# Patient Record
Sex: Female | Born: 1965 | Race: Black or African American | Hispanic: No | Marital: Married | Smoking: Never smoker
Health system: Southern US, Community
[De-identification: ages and names within clinical notes are randomized; demographics above are authoritative.]

## PROBLEM LIST (undated history)

## (undated) DIAGNOSIS — K589 Irritable bowel syndrome without diarrhea: Secondary | ICD-10-CM

## (undated) DIAGNOSIS — D649 Anemia, unspecified: Secondary | ICD-10-CM

## (undated) DIAGNOSIS — D242 Benign neoplasm of left breast: Secondary | ICD-10-CM

## (undated) DIAGNOSIS — E669 Obesity, unspecified: Secondary | ICD-10-CM

## (undated) DIAGNOSIS — M25569 Pain in unspecified knee: Secondary | ICD-10-CM

## (undated) DIAGNOSIS — Z8042 Family history of malignant neoplasm of prostate: Secondary | ICD-10-CM

## (undated) DIAGNOSIS — M5136 Other intervertebral disc degeneration, lumbar region: Secondary | ICD-10-CM

## (undated) DIAGNOSIS — M7989 Other specified soft tissue disorders: Secondary | ICD-10-CM

## (undated) DIAGNOSIS — Z8489 Family history of other specified conditions: Secondary | ICD-10-CM

## (undated) DIAGNOSIS — Z973 Presence of spectacles and contact lenses: Secondary | ICD-10-CM

## (undated) DIAGNOSIS — M549 Dorsalgia, unspecified: Secondary | ICD-10-CM

## (undated) DIAGNOSIS — Z803 Family history of malignant neoplasm of breast: Secondary | ICD-10-CM

## (undated) DIAGNOSIS — M51369 Other intervertebral disc degeneration, lumbar region without mention of lumbar back pain or lower extremity pain: Secondary | ICD-10-CM

## (undated) DIAGNOSIS — M5126 Other intervertebral disc displacement, lumbar region: Secondary | ICD-10-CM

## (undated) DIAGNOSIS — K219 Gastro-esophageal reflux disease without esophagitis: Secondary | ICD-10-CM

## (undated) DIAGNOSIS — E739 Lactose intolerance, unspecified: Secondary | ICD-10-CM

## (undated) DIAGNOSIS — F419 Anxiety disorder, unspecified: Secondary | ICD-10-CM

## (undated) DIAGNOSIS — I10 Essential (primary) hypertension: Secondary | ICD-10-CM

## (undated) DIAGNOSIS — E559 Vitamin D deficiency, unspecified: Secondary | ICD-10-CM

## (undated) DIAGNOSIS — N6011 Diffuse cystic mastopathy of right breast: Secondary | ICD-10-CM

## (undated) DIAGNOSIS — Z780 Asymptomatic menopausal state: Secondary | ICD-10-CM

## (undated) DIAGNOSIS — N920 Excessive and frequent menstruation with regular cycle: Secondary | ICD-10-CM

## (undated) DIAGNOSIS — R12 Heartburn: Secondary | ICD-10-CM

## (undated) DIAGNOSIS — E785 Hyperlipidemia, unspecified: Secondary | ICD-10-CM

## (undated) DIAGNOSIS — R0602 Shortness of breath: Secondary | ICD-10-CM

## (undated) HISTORY — DX: Family history of malignant neoplasm of prostate: Z80.42

## (undated) HISTORY — DX: Hyperlipidemia, unspecified: E78.5

## (undated) HISTORY — PX: BUNIONECTOMY: SHX129

## (undated) HISTORY — DX: Vitamin D deficiency, unspecified: E55.9

## (undated) HISTORY — DX: Irritable bowel syndrome, unspecified: K58.9

## (undated) HISTORY — DX: Essential (primary) hypertension: I10

## (undated) HISTORY — DX: Anemia, unspecified: D64.9

## (undated) HISTORY — DX: Obesity, unspecified: E66.9

## (undated) HISTORY — DX: Shortness of breath: R06.02

## (undated) HISTORY — PX: TONSILLECTOMY: SUR1361

## (undated) HISTORY — DX: Pain in unspecified knee: M25.569

## (undated) HISTORY — DX: Other intervertebral disc degeneration, lumbar region without mention of lumbar back pain or lower extremity pain: M51.369

## (undated) HISTORY — DX: Other intervertebral disc degeneration, lumbar region: M51.36

## (undated) HISTORY — DX: Other intervertebral disc displacement, lumbar region: M51.26

## (undated) HISTORY — DX: Other specified soft tissue disorders: M79.89

## (undated) HISTORY — DX: Asymptomatic menopausal state: Z78.0

## (undated) HISTORY — DX: Lactose intolerance, unspecified: E73.9

## (undated) HISTORY — DX: Family history of malignant neoplasm of breast: Z80.3

## (undated) HISTORY — DX: Heartburn: R12

## (undated) HISTORY — DX: Dorsalgia, unspecified: M54.9

---

## 1996-08-24 HISTORY — PX: DILATION AND CURETTAGE OF UTERUS: SHX78

## 1998-07-05 ENCOUNTER — Inpatient Hospital Stay (HOSPITAL_COMMUNITY): Admission: AD | Admit: 1998-07-05 | Discharge: 1998-07-05 | Payer: Self-pay | Admitting: Obstetrics and Gynecology

## 1998-07-08 ENCOUNTER — Inpatient Hospital Stay (HOSPITAL_COMMUNITY): Admission: AD | Admit: 1998-07-08 | Discharge: 1998-07-08 | Payer: Self-pay | Admitting: Obstetrics and Gynecology

## 1998-07-10 ENCOUNTER — Inpatient Hospital Stay (HOSPITAL_COMMUNITY): Admission: AD | Admit: 1998-07-10 | Discharge: 1998-07-10 | Payer: Self-pay | Admitting: *Deleted

## 1998-07-12 ENCOUNTER — Inpatient Hospital Stay (HOSPITAL_COMMUNITY): Admission: AD | Admit: 1998-07-12 | Discharge: 1998-07-17 | Payer: Self-pay | Admitting: Obstetrics and Gynecology

## 1998-07-18 ENCOUNTER — Encounter (HOSPITAL_COMMUNITY): Admission: RE | Admit: 1998-07-18 | Discharge: 1998-10-16 | Payer: Self-pay | Admitting: Obstetrics and Gynecology

## 1998-08-26 ENCOUNTER — Other Ambulatory Visit: Admission: RE | Admit: 1998-08-26 | Discharge: 1998-08-26 | Payer: Self-pay | Admitting: Obstetrics and Gynecology

## 1998-10-25 ENCOUNTER — Encounter (HOSPITAL_COMMUNITY): Admission: RE | Admit: 1998-10-25 | Discharge: 1999-01-23 | Payer: Self-pay | Admitting: *Deleted

## 2000-11-08 ENCOUNTER — Encounter: Payer: Self-pay | Admitting: Emergency Medicine

## 2000-11-08 ENCOUNTER — Emergency Department (HOSPITAL_COMMUNITY): Admission: EM | Admit: 2000-11-08 | Discharge: 2000-11-08 | Payer: Self-pay | Admitting: Emergency Medicine

## 2004-08-24 HISTORY — PX: UMBILICAL HERNIA REPAIR: SHX196

## 2004-10-27 ENCOUNTER — Ambulatory Visit: Payer: Self-pay

## 2004-11-22 ENCOUNTER — Ambulatory Visit: Payer: Self-pay

## 2006-09-19 ENCOUNTER — Emergency Department: Payer: Self-pay | Admitting: Internal Medicine

## 2006-10-23 ENCOUNTER — Encounter: Payer: Self-pay | Admitting: Family Medicine

## 2006-10-23 LAB — CONVERTED CEMR LAB

## 2006-10-26 ENCOUNTER — Ambulatory Visit: Payer: Self-pay | Admitting: Family Medicine

## 2006-11-26 ENCOUNTER — Ambulatory Visit: Payer: Self-pay | Admitting: Family Medicine

## 2006-12-21 ENCOUNTER — Encounter: Payer: Self-pay | Admitting: Family Medicine

## 2006-12-21 DIAGNOSIS — E8881 Metabolic syndrome: Secondary | ICD-10-CM | POA: Insufficient documentation

## 2006-12-21 DIAGNOSIS — R609 Edema, unspecified: Secondary | ICD-10-CM | POA: Insufficient documentation

## 2006-12-21 DIAGNOSIS — E785 Hyperlipidemia, unspecified: Secondary | ICD-10-CM | POA: Insufficient documentation

## 2006-12-21 DIAGNOSIS — I10 Essential (primary) hypertension: Secondary | ICD-10-CM | POA: Insufficient documentation

## 2007-01-03 ENCOUNTER — Ambulatory Visit: Payer: Self-pay | Admitting: Family Medicine

## 2007-01-03 LAB — CONVERTED CEMR LAB
ALT: 13 units/L (ref 0–40)
AST: 17 units/L (ref 0–37)
Albumin: 3.9 g/dL (ref 3.5–5.2)
BUN: 9 mg/dL (ref 6–23)
Basophils Absolute: 0.1 10*3/uL (ref 0.0–0.1)
Basophils Relative: 1.4 % — ABNORMAL HIGH (ref 0.0–1.0)
CO2: 31 meq/L (ref 19–32)
Calcium: 8.9 mg/dL (ref 8.4–10.5)
Chloride: 110 meq/L (ref 96–112)
Cholesterol: 212 mg/dL (ref 0–200)
Creatinine, Ser: 0.8 mg/dL (ref 0.4–1.2)
Direct LDL: 123.2 mg/dL
Eosinophils Absolute: 0.1 10*3/uL (ref 0.0–0.6)
Eosinophils Relative: 1.5 % (ref 0.0–5.0)
GFR calc Af Amer: 102 mL/min
GFR calc non Af Amer: 84 mL/min
Glucose, Bld: 84 mg/dL (ref 70–99)
HCT: 33.9 % — ABNORMAL LOW (ref 36.0–46.0)
HDL: 60.3 mg/dL (ref >39.0)
Hemoglobin: 11.3 g/dL — ABNORMAL LOW (ref 12.0–15.0)
Lymphocytes Relative: 23.6 % (ref 12.0–46.0)
MCHC: 33.5 g/dL (ref 30.0–36.0)
MCV: 86.5 fL (ref 78.0–100.0)
Monocytes Absolute: 0.6 10*3/uL (ref 0.2–0.7)
Monocytes Relative: 7.7 % (ref 3.0–11.0)
Neutro Abs: 5.5 10*3/uL (ref 1.4–7.7)
Neutrophils Relative %: 65.8 % (ref 43.0–77.0)
Phosphorus: 3.2 mg/dL (ref 2.3–4.6)
Platelets: 357 10*3/uL (ref 150–400)
Potassium: 3.6 meq/L (ref 3.5–5.1)
RBC: 3.92 M/uL (ref 3.87–5.11)
RDW: 18.1 % — ABNORMAL HIGH (ref 11.5–14.6)
Sodium: 143 meq/L (ref 135–145)
Total CHOL/HDL Ratio: 3.5
Triglycerides: 56 mg/dL (ref 0–149)
VLDL: 11 mg/dL (ref 0–40)
WBC: 8.2 10*3/uL (ref 4.5–10.5)

## 2007-01-04 ENCOUNTER — Ambulatory Visit: Payer: Self-pay | Admitting: Family Medicine

## 2007-01-04 DIAGNOSIS — D509 Iron deficiency anemia, unspecified: Secondary | ICD-10-CM | POA: Insufficient documentation

## 2007-01-12 ENCOUNTER — Ambulatory Visit: Payer: Self-pay | Admitting: Obstetrics & Gynecology

## 2007-01-12 ENCOUNTER — Other Ambulatory Visit: Payer: Self-pay

## 2007-01-13 ENCOUNTER — Ambulatory Visit: Payer: Self-pay | Admitting: Obstetrics & Gynecology

## 2007-04-22 ENCOUNTER — Ambulatory Visit: Payer: Self-pay | Admitting: Family Medicine

## 2007-04-22 ENCOUNTER — Telehealth (INDEPENDENT_AMBULATORY_CARE_PROVIDER_SITE_OTHER): Payer: Self-pay | Admitting: *Deleted

## 2007-04-22 DIAGNOSIS — J321 Chronic frontal sinusitis: Secondary | ICD-10-CM | POA: Insufficient documentation

## 2007-05-17 ENCOUNTER — Ambulatory Visit: Payer: Self-pay | Admitting: Family Medicine

## 2007-05-26 ENCOUNTER — Telehealth (INDEPENDENT_AMBULATORY_CARE_PROVIDER_SITE_OTHER): Payer: Self-pay | Admitting: *Deleted

## 2007-07-20 ENCOUNTER — Ambulatory Visit: Payer: Self-pay | Admitting: Family Medicine

## 2007-08-09 ENCOUNTER — Telehealth: Payer: Self-pay | Admitting: Family Medicine

## 2007-08-17 ENCOUNTER — Ambulatory Visit: Payer: Self-pay | Admitting: Family Medicine

## 2007-08-18 ENCOUNTER — Encounter: Payer: Self-pay | Admitting: Family Medicine

## 2007-09-22 ENCOUNTER — Telehealth (INDEPENDENT_AMBULATORY_CARE_PROVIDER_SITE_OTHER): Payer: Self-pay | Admitting: *Deleted

## 2008-01-17 ENCOUNTER — Ambulatory Visit: Payer: Self-pay | Admitting: Family Medicine

## 2008-03-08 ENCOUNTER — Encounter: Payer: Self-pay | Admitting: Family Medicine

## 2008-03-14 ENCOUNTER — Encounter: Payer: Self-pay | Admitting: Family Medicine

## 2008-03-20 ENCOUNTER — Ambulatory Visit: Payer: Self-pay | Admitting: Family Medicine

## 2008-03-20 ENCOUNTER — Encounter (INDEPENDENT_AMBULATORY_CARE_PROVIDER_SITE_OTHER): Payer: Self-pay | Admitting: Internal Medicine

## 2008-03-20 ENCOUNTER — Other Ambulatory Visit: Admission: RE | Admit: 2008-03-20 | Discharge: 2008-03-20 | Payer: Self-pay | Admitting: Family Medicine

## 2008-03-20 LAB — CONVERTED CEMR LAB: Pap Smear: NORMAL

## 2008-03-23 ENCOUNTER — Encounter (INDEPENDENT_AMBULATORY_CARE_PROVIDER_SITE_OTHER): Payer: Self-pay | Admitting: *Deleted

## 2008-03-23 ENCOUNTER — Encounter (INDEPENDENT_AMBULATORY_CARE_PROVIDER_SITE_OTHER): Payer: Self-pay | Admitting: Internal Medicine

## 2008-03-27 ENCOUNTER — Encounter: Payer: Self-pay | Admitting: Family Medicine

## 2008-08-14 ENCOUNTER — Ambulatory Visit: Payer: Self-pay | Admitting: Family Medicine

## 2008-09-18 ENCOUNTER — Encounter: Payer: Self-pay | Admitting: Family Medicine

## 2009-04-02 ENCOUNTER — Encounter: Payer: Self-pay | Admitting: Family Medicine

## 2009-04-05 ENCOUNTER — Encounter: Payer: Self-pay | Admitting: Family Medicine

## 2009-04-12 ENCOUNTER — Ambulatory Visit: Payer: Self-pay | Admitting: Family Medicine

## 2009-04-12 ENCOUNTER — Other Ambulatory Visit: Admission: RE | Admit: 2009-04-12 | Discharge: 2009-04-12 | Payer: Self-pay | Admitting: Family Medicine

## 2009-04-12 ENCOUNTER — Encounter: Payer: Self-pay | Admitting: Family Medicine

## 2009-04-15 ENCOUNTER — Ambulatory Visit: Payer: Self-pay | Admitting: Family Medicine

## 2009-04-15 ENCOUNTER — Telehealth: Payer: Self-pay | Admitting: Family Medicine

## 2009-04-16 ENCOUNTER — Encounter (INDEPENDENT_AMBULATORY_CARE_PROVIDER_SITE_OTHER): Payer: Self-pay | Admitting: *Deleted

## 2009-07-12 ENCOUNTER — Ambulatory Visit: Payer: Self-pay | Admitting: Family Medicine

## 2009-07-12 ENCOUNTER — Other Ambulatory Visit: Admission: RE | Admit: 2009-07-12 | Discharge: 2009-07-12 | Payer: Self-pay | Admitting: Family Medicine

## 2009-07-12 DIAGNOSIS — R87610 Atypical squamous cells of undetermined significance on cytologic smear of cervix (ASC-US): Secondary | ICD-10-CM | POA: Insufficient documentation

## 2009-07-23 ENCOUNTER — Encounter (INDEPENDENT_AMBULATORY_CARE_PROVIDER_SITE_OTHER): Payer: Self-pay | Admitting: *Deleted

## 2009-08-29 ENCOUNTER — Encounter: Payer: Self-pay | Admitting: Family Medicine

## 2009-08-29 ENCOUNTER — Ambulatory Visit: Payer: Self-pay | Admitting: Family Medicine

## 2009-08-29 DIAGNOSIS — S04019A Injury of optic nerve, unspecified eye, initial encounter: Secondary | ICD-10-CM | POA: Insufficient documentation

## 2009-08-29 DIAGNOSIS — S04039A Injury of optic tract and pathways, unspecified eye, initial encounter: Secondary | ICD-10-CM | POA: Insufficient documentation

## 2009-09-23 ENCOUNTER — Telehealth: Payer: Self-pay | Admitting: Family Medicine

## 2009-09-26 ENCOUNTER — Ambulatory Visit: Payer: Self-pay | Admitting: Family Medicine

## 2009-10-03 ENCOUNTER — Telehealth: Payer: Self-pay | Admitting: Family Medicine

## 2009-10-11 ENCOUNTER — Ambulatory Visit: Payer: Self-pay | Admitting: Family Medicine

## 2009-11-01 ENCOUNTER — Ambulatory Visit: Payer: Self-pay | Admitting: Family Medicine

## 2009-12-04 ENCOUNTER — Telehealth: Payer: Self-pay | Admitting: Family Medicine

## 2010-03-11 ENCOUNTER — Encounter: Payer: Self-pay | Admitting: Family Medicine

## 2010-04-02 ENCOUNTER — Ambulatory Visit: Payer: Self-pay | Admitting: Family Medicine

## 2010-04-14 ENCOUNTER — Ambulatory Visit: Payer: Self-pay | Admitting: Family Medicine

## 2010-04-14 DIAGNOSIS — M722 Plantar fascial fibromatosis: Secondary | ICD-10-CM | POA: Insufficient documentation

## 2010-04-14 DIAGNOSIS — K219 Gastro-esophageal reflux disease without esophagitis: Secondary | ICD-10-CM | POA: Insufficient documentation

## 2010-04-21 ENCOUNTER — Encounter: Payer: Self-pay | Admitting: Family Medicine

## 2010-04-21 LAB — HM MAMMOGRAPHY: HM Mammogram: NORMAL

## 2010-04-22 ENCOUNTER — Encounter: Payer: Self-pay | Admitting: Family Medicine

## 2010-08-01 ENCOUNTER — Other Ambulatory Visit
Admission: RE | Admit: 2010-08-01 | Discharge: 2010-08-01 | Payer: Self-pay | Source: Home / Self Care | Admitting: Family Medicine

## 2010-08-01 ENCOUNTER — Encounter: Payer: Self-pay | Admitting: Family Medicine

## 2010-08-01 ENCOUNTER — Ambulatory Visit: Payer: Self-pay | Admitting: Internal Medicine

## 2010-08-01 DIAGNOSIS — N898 Other specified noninflammatory disorders of vagina: Secondary | ICD-10-CM | POA: Insufficient documentation

## 2010-08-01 LAB — CONVERTED CEMR LAB
Bilirubin Urine: NEGATIVE
Blood in Urine, dipstick: NEGATIVE
Glucose, Urine, Semiquant: NEGATIVE
KOH Prep: NEGATIVE
Ketones, urine, test strip: NEGATIVE
Nitrite: NEGATIVE
Pap Smear: NORMAL
Protein, U semiquant: NEGATIVE
Specific Gravity, Urine: <1.005
Urobilinogen, UA: 0.2
WBC Urine, dipstick: NEGATIVE
Whiff Test: POSITIVE
pH: 7.5

## 2010-08-01 LAB — HM PAP SMEAR

## 2010-08-05 ENCOUNTER — Ambulatory Visit: Payer: Self-pay | Admitting: Family Medicine

## 2010-08-07 ENCOUNTER — Encounter: Payer: Self-pay | Admitting: Family Medicine

## 2010-08-07 ENCOUNTER — Encounter (INDEPENDENT_AMBULATORY_CARE_PROVIDER_SITE_OTHER): Payer: Self-pay | Admitting: *Deleted

## 2010-08-07 LAB — CONVERTED CEMR LAB: Pap Smear: NEGATIVE

## 2010-09-23 NOTE — Assessment & Plan Note (Signed)
Summary: HA/CLE  Medications Added LEVAQUIN 500 MG  TABS (LEVOFLOXACIN) 1 by mouth once daily for 10 days FLONASE 50 MCG/ACT  SUSP (FLUTICASONE PROPIONATE) 2 sprays in each nostril qd DIFLUCAN 150 MG  TABS (FLUCONAZOLE) 1 by mouth times one as needed yeast infection        Vital Signs:  Patient Profile:   45 Years Old Female Height:     65.5 inches (166.37 cm) Weight:      181 pounds Temp:     98.5 degrees F oral Pulse rate:   72 / minute Pulse rhythm:   regular BP sitting:   142 / 94  (left arm) Cuff size:   large  Vitals Entered By: Lowella Petties (April 22, 2007 12:38 PM)                 Chief Complaint:  ? sinus headaches.  History of Present Illness: has some pain in L side of face with a lot of pressure- is worse in the middle of the nt 2 weeks felt like she was getting a cold-but never got if full blown had bp checked at gyn bp was 120/80 was taking some advil and tylenol then got some hoarseness- with post drip tylenol sinus helped short term- esp if she takes it at nt not much nasal d/c, some stuffiness (worse at nt) has had allergies for the past several years has never had sinus headaches like this  no fever, some dry cough at times has been very tired lately  did have a proceedure for heavy periods may be all to pcn?  Current Allergies: ! MEVACOR (LOVASTATIN) ! PCN     Review of Systems      See HPI  General      Complains of fatigue.      Denies chills, fever, and loss of appetite.  Eyes      Denies eye irritation.  ENT      Complains of nasal congestion, postnasal drainage, and sinus pressure.  Resp      Denies cough and shortness of breath.  GI      Denies nausea and vomiting.  Derm      Denies lesion(s) and rash.  Psych      mood is ok   Physical Exam  General:     Well-developed,well-nourished,in no acute distress; alert,appropriate and cooperative throughout examination Head:     Normocephalic and atraumatic  without obvious abnormalities. No apparent alopecia or balding. L max and frontal sinus tenderness Eyes:     vision grossly intact, pupils equal, pupils round, and pupils reactive to light.   Ears:     R ear normal and L ear normal.   Nose:     mucosal erythema and mucosal edema.   Mouth:     pharynx pink and moist.   Neck:     No deformities, masses, or tenderness noted.supple, no thyromegaly, and no JVD.   Lungs:     Normal respiratory effort, chest expands symmetrically. Lungs are clear to auscultation, no crackles or wheezes. Heart:     Normal rate and regular rhythm. S1 and S2 normal without gallop, murmur, click, rub or other extra sounds. Extremities:     No clubbing, cyanosis, edema, or deformity noted with normal full range of motion of all joints.   Skin:     turgor normal, color normal, and no rashes.   Cervical Nodes:     No lymphadenopathy noted Psych:     nl  affect, cheerful    Impression & Recommendations:  Problem # 1:  SINUSITIS, CHRONIC FRONTAL (ICD-473.1) will tx with levaquin (? all pcn) drink fluids inst to call if inc facial pain or no imp can try flonase for congestion Her updated medication list for this problem includes:    Levaquin 500 Mg Tabs (Levofloxacin) .Marland Kitchen... 1 by mouth once daily for 10 days    Flonase 50 Mcg/act Susp (Fluticasone propionate) .Marland Kitchen... 2 sprays in each nostril qd   Problem # 2:  HYPERTENSION (ICD-401.9) blood pressure is up today from decongestant will stop it also may have to go on OC for 1 mo- this will also raise bp- but will just be short term will f/u in several mo Her updated medication list for this problem includes:    Torsemide 20 Mg Tabs (Torsemide) .Marland Kitchen... Take one by mouth daily   Complete Medication List: 1)  Klor-con 20 Meq Pack (Potassium chloride) .... Take one by mouth daily 2)  Torsemide 20 Mg Tabs (Torsemide) .... Take one by mouth daily 3)  Vitamin B-12 Tabs (Cyanocobalamin tabs) .... Take by mouth as  directed 4)  Fish Oil Caps (Omega-3 fatty acids caps) .... Take two by mouth daily 5)  Ferrous Sulfate 325 (65 Fe) Mg Tabs (Ferrous sulfate) .... Take one by mouth daily 6)  Levaquin 500 Mg Tabs (Levofloxacin) .Marland Kitchen.. 1 by mouth once daily for 10 days 7)  Flonase 50 Mcg/act Susp (Fluticasone propionate) .... 2 sprays in each nostril qd 8)  Diflucan 150 Mg Tabs (Fluconazole) .Marland Kitchen.. 1 by mouth times one as needed yeast infection     Prescriptions: DIFLUCAN 150 MG  TABS (FLUCONAZOLE) 1 by mouth times one as needed yeast infection  #1 x 0   Entered and Authorized by:   Judith Part MD   Signed by:   Judith Part MD on 04/22/2007   Method used:   Print then Give to Patient   RxID:   [id number] FLONASE 50 MCG/ACT  SUSP (FLUTICASONE PROPIONATE) 2 sprays in each nostril qd  #1 mdi x 3   Entered and Authorized by:   Judith Part MD   Signed by:   Judith Part MD on 04/22/2007   Method used:   Print then Give to Patient   RxID:   [id number] LEVAQUIN 500 MG  TABS (LEVOFLOXACIN) 1 by mouth once daily for 10 days  #10 x 0   Entered and Authorized by:   Judith Part MD   Signed by:   Judith Part MD on 04/22/2007   Method used:   Print then Give to Patient   RxID:   [id number]   Appended Document: Immunization Entry       PPD Results    Date of reading: 05/19/2007    Results: < 5mm    Interpretation: negative

## 2010-09-23 NOTE — Assessment & Plan Note (Signed)
Summary: REPEAT PAP SMEAR/CLE   Vital Signs:  Patient profile:   45 year old female Weight:      178.50 pounds (81.14 kg) BMI:     29.36 Temp:     98.0 degrees F (36.67 degrees C) oral Pulse rate:   80 / minute Pulse rhythm:   regular BP sitting:   120 / 80  (left arm)  Vitals Entered By: Silas Sacramento CMA (July 12, 2009 8:44 AM) CC: repeat pap smear and cough for three weeks   History of Present Illness: here for repeat pap smear  had ascus  pap on 8/20 with neg hpv (high risk) no vaginal d/c or other symptoms  no hx of abn pap   LMP was early this month -- about 2 weeks ago nov 1   also been coughing for 3 weeks  thinks she has a sinus problems  about 3 weeks started as prod cough-- was green sputum and then yellow occ runny nose- but nothing major  a little sinus pressure for a while- used sinus headache med - and that is better  no fever  feels ok  cough is really bad at night -- better during the day  cough medicine- robitussin and old tussionex  not coughing anything up at all   Allergies: 1)  ! Mevacor (Lovastatin) 2)  ! Pcn  Past History:  Past Medical History: Last updated: 12/21/2006 Hyperlipidemia Hypertension  Past Surgical History: Last updated: 03/20/2008 C-S Hernia repain TSA 03/14/08 mammogram and [place]-- R breast mass   Family History: Last updated: 08/17/2007 parents both HTN, OA GM had some heart probs- implantible defibrillator MGM with breast ca  Social History: Last updated: 03/20/2008 non smoker Marital Status: Married Children: 2 Occupation: adlministrative  in Editor, commissioning care  Risk Factors: Caffeine Use: 2 (03/20/2008) Exercise: no (03/20/2008)  Risk Factors: Smoking Status: never (12/21/2006) Passive Smoke Exposure: yes (03/20/2008)  Review of Systems General:  Complains of fatigue; denies chills, fever, loss of appetite, and malaise. Eyes:  Denies blurring and eye irritation. ENT:   Complains of nasal congestion, postnasal drainage, and sore throat; denies sinus pressure. CV:  Denies chest pain or discomfort, lightheadness, palpitations, and shortness of breath with exertion. Resp:  Complains of cough; denies pleuritic, sputum productive, and wheezing. GI:  Denies abdominal pain, bloody stools, change in bowel habits, and indigestion. GU:  Denies abnormal vaginal bleeding, discharge, and dysuria. MS:  Denies joint pain. Derm:  Denies itching, lesion(s), poor wound healing, and rash. Neuro:  Denies numbness and tingling. Psych:  mood is ok. Endo:  Denies cold intolerance, excessive thirst, excessive urination, and heat intolerance.  Physical Exam  General:  Well-developed,well-nourished,in no acute distress; alert,appropriate and cooperative throughout examination Head:  normocephalic, atraumatic, and no abnormalities observed.  no sinus tenderness  Eyes:  vision grossly intact, pupils equal, pupils round, pupils reactive to light, and no injection.   Ears:  R ear normal and L ear normal.   Nose:  nares are boggy but clear  Mouth:  pharynx pink and moist.   some throat clearing and post nasal drip Neck:  supple with full rom and no masses or thyromegally, no JVD or carotid bruit  Lungs:  Normal respiratory effort, chest expands symmetrically. Lungs are clear to auscultation, no crackles or wheezes. Heart:  normal rate, regular rhythm, and no murmur.   Genitalia:  Normal introitus for age, no external lesions, no vaginal discharge, mucosa pink and moist, no vaginal or cervical lesions, no  vaginal atrophy, no friaility or hemorrhage, normal uterus size and position, no adnexal masses or tenderness Skin:  Intact without suspicious lesions or rashes Cervical Nodes:  No lymphadenopathy noted Inguinal Nodes:  No significant adenopathy Psych:  normal affect, talkative and pleasant    Impression & Recommendations:  Problem # 1:  COUGH (ICD-786.2) Assessment New s/p uri  - improved but not resolved and bothersome at night (no reflux symptoms) disc poss cyclic cough will tx with tussionex as needed (caution of sed)  if not imp 1 wk or if worse or fever or phlegm- will update   Problem # 2:  ASCUS PAP (ICD-795.01) Assessment: New repeat today -- with neg high risk hpv  update with result   Complete Medication List: 1)  Klor-con 20 Meq Pack (Potassium chloride) .... Take one by mouth daily 2)  Torsemide 20 Mg Tabs (Torsemide) .... Take one by mouth daily 3)  Ferrous Sulfate 325 (65 Fe) Mg Tabs (Ferrous sulfate) .... Take one by mouth daily 4)  One Daily Tabs (Multiple vitamin) .... Take 1 tablet by mouth once a day 5)  Tussionex Pennkinetic Er 8-10 Mg/37ml Lqcr (Chlorpheniramine-hydrocodone) .... 1/2 to 1 tsp by mouth up to two times a day as needed severe cough  Patient Instructions: 1)  I think your cough is post viral  2)  use tussionex as needed with caution 3)  update me if it worsens or if more productive or fever or if not improved in 1 week 4)  repeat pap today-will update you with results Prescriptions: TUSSIONEX PENNKINETIC ER 8-10 MG/5ML LQCR (CHLORPHENIRAMINE-HYDROCODONE) 1/2 to 1 tsp by mouth up to two times a day as needed severe cough  #8 oz x 0   Entered and Authorized by:   Judith Part MD   Signed by:   Judith Part MD on 07/12/2009   Method used:   Print then Give to Patient   RxID:   [id number]

## 2010-09-23 NOTE — Assessment & Plan Note (Signed)
Summary: BP CHECK PER DR Buffi Ewton/RI   Vital Signs:  Patient profile:   45 year old female Height:      65.5 inches Weight:      184.75 pounds BMI:     30.39 Temp:     97.9 degrees F oral Pulse rate:   76 / minute Pulse rhythm:   regular BP sitting:   136 / 86  (left arm) Cuff size:   large  Vitals Entered By: Lewanda Rife LPN (October 11, 2009 8:53 AM)  Serial Vital Signs/Assessments:  Time      Position  BP       Pulse  Resp  Temp     By                     138/90                         Judith Part MD   History of Present Illness: bps were running in 150s/ 100s - wed and thursday also was feeling really bad with a cold and congestion  felt funny and dizzy too and hard to focus   ? 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 Mevacor 2)  ! Pcn  Past History:  Past Medical History: Last updated: 12/21/2006 Hyperlipidemia Hypertension  Past Surgical History: Last updated: 03/20/2008 C-S Hernia repain TSA 03/14/08 mammogram and [place]-- R breast mass   Family History: Last updated: 08/17/2007 parents both HTN, OA GM had some heart probs- implantible defibrillator MGM with breast ca  Social History: Last updated: 03/20/2008 non smoker Marital Status: Married Children: 2 Occupation: adlministrative  in Editor, commissioning care  Risk Factors: Caffeine Use: 2 (03/20/2008) Exercise: no (03/20/2008)  Risk Factors: Smoking Status: never (12/21/2006) Passive Smoke Exposure: yes (03/20/2008)  Review of Systems General:  Complains of fatigue; denies chills, fever, loss of appetite, and malaise. Eyes:  Denies blurring and discharge. CV:  Denies chest pain or discomfort, lightheadness, near fainting, and palpitations. Resp:  Denies cough and wheezing. GI:  Denies abdominal  pain, bloody stools, and change in bowel habits. GU:  Denies urinary frequency. MS:  Denies muscle weakness. Derm:  Denies itching, lesion(s), poor wound healing, and rash. Neuro:  Denies headaches, numbness, and sensation of room spinning. Psych:  Denies anxiety and depression. Endo:  Denies cold intolerance, excessive thirst, excessive urination, and heat intolerance. Heme:  Denies abnormal bruising and bleeding.  Physical Exam  General:  Well-developed,well-nourished,in no acute distress; alert,appropriate and cooperative throughout examination Head:  normocephalic, atraumatic, and no abnormalities observed.  no sinus or temporal tenderness Eyes:  vision grossly intact, pupils equal, pupils round, and pupils reactive to light.   Mouth:  pharynx pink and moist.   Neck:  supple with full rom and no masses or thyromegally, no JVD or carotid bruit  Lungs:  Normal respiratory effort, chest expands symmetrically. Lungs are clear to auscultation, no crackles or wheezes. Heart:  Normal rate and regular rhythm. S1 and S2 normal without gallop, murmur, click, rub or other extra sounds. Msk:  small callous on right midfoot Extremities:  No clubbing, cyanosis, edema, or deformity noted with normal full range of motion of  all joints.   Neurologic:  sensation intact to light touch, gait normal, and DTRs symmetrical and normal.   Skin:  Intact without suspicious lesions or rashes Cervical Nodes:  No lymphadenopathy noted Psych:  normal affect, talkative and pleasant    Impression & Recommendations:  Problem # 1:  HYPERTENSION (ICD-401.9) Assessment Deteriorated bp up with multiple factors incl work stress and no time to exercise  will inc lisinopril to 20 mg - update if side eff or problems  f/u 4-6 wk disc low salt diet with inc water intakd  try to inc more exercise  Her updated medication list for this problem includes:    Torsemide 20 Mg Tabs (Torsemide) .Marland Kitchen... Take one by mouth daily     Lisinopril 20 Mg Tabs (Lisinopril) .Marland Kitchen... 1 by mouth once daily  Complete Medication List: 1)  Klor-con 20 Meq Pack (Potassium chloride) .... Take one by mouth daily 2)  Torsemide 20 Mg Tabs (Torsemide) .... Take one by mouth daily 3)  Ferrous Sulfate 325 (65 Fe) Mg Tabs (Ferrous sulfate) .... Take one by mouth daily 4)  One Daily Tabs (Multiple vitamin) .... Take 1 tablet by mouth once a day 5)  Flexeril 10 Mg Tabs (Cyclobenzaprine hcl) .... 1/2 to 1 by mouth up to three times a day as needed neck pain 6)  Lisinopril 20 Mg Tabs (Lisinopril) .Marland Kitchen.. 1 by mouth once daily  Patient Instructions: 1)  increase you lisinoprl to 20 mg daily (that is 2 of your 10 mg pills)  2)  no change in other medicine 3)  try to incorporate a little exercise  4)  watch salt and drink lots of water  5)  follow with me in 4-6 weeks  Prescriptions: LISINOPRIL 20 MG TABS (LISINOPRIL) 1 by mouth once daily  #30 x 11   Entered and Authorized by:   Judith Part MD   Signed by:   Judith Part MD on 10/11/2009   Method used:   Print then Give to Patient   RxID:   [id number]   Current Allergies (reviewed today): ! MEVACOR ! PCN

## 2010-09-23 NOTE — Assessment & Plan Note (Signed)
Summary: ? BACTERIAL INFECTION/PAP   Vital Signs:  Patient profile:   45 year old female Height:      65.75 inches Weight:      186.25 pounds BMI:     30.40 Temp:     98.6 degrees F oral Pulse rate:   80 / minute Pulse rhythm:   regular BP sitting:   120 / 88  (right arm) Cuff size:   large  Vitals Entered By: Linde Gillis CMA Duncan Dull) (August 01, 2010 8:15 AM) CC: ? bacterial infection   History of Present Illness: CC: ?bacterial infection  LMP 07/14/2010  Husband with vasectomy.  12/1 - fishy odor.  No discharge.   12/6 - slight white discharge, horrible odor.  Has had yeast infections before, no bacterial.  No fevers/chills, abd pain, dysuria.  Hasn't tried anything so far.  scheduled for pap 08/05/2010 because last pap was 07/2009, normal.  Abnl 03/2009 - ASCUS no high risk HPV detected.  requests rpt today, so doesn't have to do twice.  agreed.  Allergies: 1)  ! Mevacor 2)  ! Pcn  Past History:  Past Medical History: Last updated: 12/21/2006 Hyperlipidemia Hypertension  Social History: Last updated: 03/20/2008 non smoker Marital Status: Married Children: 2 Occupation: Theatre manager  in Editor, commissioning care  Review of Systems       per [employer]  Physical Exam  General:  overweight but generally well appearing  Genitalia:  Pelvic Exam:        External: normal female genitalia without lesions or masses        Vagina: normal without lesions or masses, slight white discharge, no odor        Cervix: normal without lesions or masses        Adnexa: normal bimanual exam without masses or fullness        Uterus: normal by palpation        Pap smear: performed   Impression & Recommendations:  Problem # 1:  LEUKORRHEA (ICD-623.5) wet prep with mild +whiff, + clue cells.  treat with flagyl.  advised to abstain from EtOH. Orders: UA Dipstick w/o Micro (manual) (04540) Wet Prep ([id number])  Problem # 2:  SCREENING FOR MALIGNANT NEOPLASM OF  THE CERVIX (ICD-V76.2) pap peformed.  Orders: Pap Smear, Thin Prep ( Collection of) ([id number])  Complete Medication List: 1)  Klor-con M20 20 Meq Cr-tabs (Potassium chloride crys cr) .Marland Kitchen.. 1 by mouth once daily 2)  Torsemide 20 Mg Tabs (Torsemide) .... Take one by mouth daily 3)  Ferrous Sulfate 325 (65 Fe) Mg Tabs (Ferrous sulfate) .... Take one by mouth daily 4)  One Daily Tabs (Multiple vitamin) .... Take 1 tablet by mouth once a day 5)  Flexeril 10 Mg Tabs (Cyclobenzaprine hcl) .... 1/2 to 1 by mouth up to three times a day as needed neck pain 6)  Lisinopril 20 Mg Tabs (Lisinopril) .Marland Kitchen.. 1 by mouth once daily 7)  Omeprazole 20 Mg Cpdr (Omeprazole) .Marland Kitchen.. 1 by mouth once daily in am 8)  Metronidazole 500 Mg Tabs (Metronidazole) .... One twice daily for 7 days, no etoh with it  Patient Instructions: 1)  we will contact you with pap smear results.  If you haven't heard from [place] in 2 wks, call us. 2)  Looks like you do have bacterial infection. 3)  Treat with flagyl twice daily for 7 days - no alcohol with this medicine. 4)  Let us know if not better. Prescriptions: METRONIDAZOLE 500 MG TABS (METRONIDAZOLE) one  twice daily for 7 days, no EtOH with it  #14 x 0   Entered and Authorized by:   Eustaquio Boyden  MD   Signed by:   Eustaquio Boyden  MD on 08/01/2010   Method used:   Electronically to        [facility]  [address] (retail)       [address]       [place], [place]  [address]       Ph: [phone]       Fax: [phone]   RxID:   [id number]    Orders Added: 1)  UA Dipstick w/o Micro (manual) [81002] 2)  Wet Prep [38756EP] 3)  Pap Smear, Thin Prep ( Collection of) [Q0091] 4)  Est. Patient Level III [32951]    Current Allergies (reviewed today): ! MEVACOR ! PCN  Laboratory Results   Urine Tests  Date/Time Received: August 01, 2010 8:20 AM   Routine Urinalysis   Color: yellow Appearance: Clear Glucose: negative   (Normal Range:  Negative) Bilirubin: negative   (Normal Range: Negative) Ketone: negative   (Normal Range: Negative) Spec. Gravity: <1.005   (Normal Range: 1.003-1.035) Blood: negative   (Normal Range: Negative) pH: 7.5   (Normal Range: 5.0-8.0) Protein: negative   (Normal Range: Negative) Urobilinogen: 0.2   (Normal Range: 0-1) Nitrite: negative   (Normal Range: Negative) Leukocyte Esterace: negative   (Normal Range: Negative)      Wet [place] Source: vaginal WBC/hpf: 1-5 Bacteria/hpf: 2+  Cocci Clue cells/hpf: moderate  Positive whiff Yeast/hpf: none Wet [place] KOH: Negative Trichomonas/hpf: none Comments: faint whiff positive. read by .................Eustaquio Boyden  MD  August 01, 2010 8:57 AM

## 2010-09-23 NOTE — Assessment & Plan Note (Signed)
Summary: COUGH,ST/CLE  Medications Added ZITHROMAX Z-PAK 250 MG  TABS (AZITHROMYCIN) take by mouth as directed TUSSIONEX PENNKINETIC ER 8-10 MG/5ML  LQCR (CHLORPHENIRAMINE-HYDROCODONE) 1/2 to 1 tsp by mouth two times a day as needed cough DIFLUCAN 150 MG  TABS (FLUCONAZOLE) 1 by mouth times one as needed yeast infection        Vital Signs:  Patient Profile:   45 Years Old Female Height:     65.5 inches (166.37 cm) Weight:      190 pounds Temp:     97.9 degrees F oral Pulse rate:   76 / minute BP sitting:   124 / 78  (left arm)  Vitals Entered By: Wandra Mannan (July 20, 2007 12:27 PM)                 Chief Complaint:  cough and st.  History of Present Illness: has had cold symptoms for 2-3 weeks- tried some otc zyrtec- which helped at first sinus drainage, then hoarse- no squeaky coughing up yellow green phlegm- especially at night cough is severe at night tried some ny quil cough- helped a little no fever that she can tell- does get hot some sinus pressure in her forehead- esp over the weekend ears feel ok throat very raw over the weekend- a little better now  Current Allergies (reviewed today): ! MEVACOR (LOVASTATIN) ! PCN     Review of Systems      See HPI  General      Complains of fatigue.      Denies chills and fever.  Eyes      Denies eye irritation and itching.  ENT      ears feel stopped up- cannot hear as well  Resp      Complains of cough and sputum productive.      Denies wheezing.  GI      Denies diarrhea, nausea, and vomiting.  Derm      Denies rash.   Physical Exam  General:     Well-developed,well-nourished,in no acute distress; alert,appropriate and cooperative throughout examination Head:     normocephalic, atraumatic, and no abnormalities observed.  slt maxillary sinus tenderness Eyes:     vision grossly intact, pupils equal, pupils round, pupils reactive to light, and no injection.   Ears:     R ear normal and L  ear normal.   Nose:     mucosal erythema and mucosal edema.   Mouth:     pharynx pink and moist.   Neck:     No deformities, masses, or tenderness noted. Lungs:     harsh bs at bases no rales or rhonchi scant wheeze on forced exp only Heart:     Normal rate and regular rhythm. S1 and S2 normal without gallop, murmur, click, rub or other extra sounds. Skin:     Intact without suspicious lesions or rashes Cervical Nodes:     No lymphadenopathy noted Psych:     nl affect    Impression & Recommendations:  Problem # 1:  BRONCHITIS-ACUTE (ICD-466.0) will tx with zithromax diflucan as needed yeast fluids and rest- update if no improvement use tussionex with caution because of sedation The following medications were removed from the medication list:    Levaquin 500 Mg Tabs (Levofloxacin) .Marland Kitchen... 1 by mouth once daily for 10 days  Her updated medication list for this problem includes:    Zithromax Z-pak 250 Mg Tabs (Azithromycin) .Marland Kitchen... Take by mouth as directed    Tussionex  Pennkinetic Er 8-10 Mg/25ml Lqcr (Chlorpheniramine-hydrocodone) .Marland Kitchen... 1/2 to 1 tsp by mouth two times a day as needed cough   Complete Medication List: 1)  Klor-con 20 Meq Pack (Potassium chloride) .... Take one by mouth daily 2)  Torsemide 20 Mg Tabs (Torsemide) .... Take one by mouth daily 3)  Fish Oil Caps (Omega-3 fatty acids caps) .... Take two by mouth daily 4)  Ferrous Sulfate 325 (65 Fe) Mg Tabs (Ferrous sulfate) .... Take one by mouth daily 5)  Flonase 50 Mcg/act Susp (Fluticasone propionate) .... 2 sprays in each nostril qd 6)  Zithromax Z-pak 250 Mg Tabs (Azithromycin) .... Take by mouth as directed 7)  Tussionex Pennkinetic Er 8-10 Mg/34ml Lqcr (Chlorpheniramine-hydrocodone) .... 1/2 to 1 tsp by mouth two times a day as needed cough 8)  Diflucan 150 Mg Tabs (Fluconazole) .Marland Kitchen.. 1 by mouth times one as needed yeast infection   Patient Instructions: 1)  try mucinex to help with congestion (it loosens  it) 2)  nasal saline spray is good for nasal congestion 3)  take zithromax as directed 4)  use the diflucan if needed for yeast infection 5)  tussionex may sedate- so be cautious 6)  if worse cough, fever, or shortness of breath or sinus pain- let me know 7)  call if not improving in a week    Prescriptions: DIFLUCAN 150 MG  TABS (FLUCONAZOLE) 1 by mouth times one as needed yeast infection  #1 x 1   Entered and Authorized by:   Judith Part MD   Signed by:   Judith Part MD on 07/20/2007   Method used:   Print then Give to Patient   RxID:   [id number] [facility] ER 8-10 MG/5ML  LQCR (CHLORPHENIRAMINE-HYDROCODONE) 1/2 to 1 tsp by mouth two times a day as needed cough  #8 oz x 0   Entered and Authorized by:   Judith Part MD   Signed by:   Judith Part MD on 07/20/2007   Method used:   Print then Give to Patient   RxID:   [id number] ZITHROMAX Z-PAK 250 MG  TABS (AZITHROMYCIN) take by mouth as directed  #1 pack x 0   Entered and Authorized by:   Judith Part MD   Signed by:   Judith Part MD on 07/20/2007   Method used:   Print then Give to Patient   RxID:   [id number]  ] Current Allergies (reviewed today): ! MEVACOR (LOVASTATIN) ! PCN Current Medications (including changes made in today's visit):  KLOR-CON 20 MEQ PACK (POTASSIUM CHLORIDE) Take one by mouth daily TORSEMIDE 20 MG TABS (TORSEMIDE) Take one by mouth daily FISH OIL  CAPS (OMEGA-3 FATTY ACIDS CAPS) Take two by mouth daily FERROUS SULFATE 325 (65 FE) MG TABS (FERROUS SULFATE) Take one by mouth daily FLONASE 50 MCG/ACT  SUSP (FLUTICASONE PROPIONATE) 2 sprays in each nostril qd ZITHROMAX Z-PAK 250 MG  TABS (AZITHROMYCIN) take by mouth as directed TUSSIONEX PENNKINETIC ER 8-10 MG/5ML  LQCR (CHLORPHENIRAMINE-HYDROCODONE) 1/2 to 1 tsp by mouth two times a day as needed cough DIFLUCAN 150 MG  TABS (FLUCONAZOLE) 1 by mouth times one as needed yeast infection    Vital Signs:   Patient Profile:   45 Years Old Female Height:     65.5 inches (166.37 cm) Weight:      190 pounds Temp:     97.9 degrees F oral Pulse rate:   76 / minute BP sitting:   124 /  78              

## 2010-09-23 NOTE — Progress Notes (Signed)
Summary: ?possible interaction between meds?   Phone Note Call from Patient Call back at [phone]   Caller: Patient Call For: tower Summary of Call: pt was just seen she says seh spoke to gyn and they put her on the patch instead of the pill. pt read package and it said there was a higher chance for blood clot on patch than on pill,  sje wants to make sure patch is ok to take with her bp med. Initial call taken by: Liane Comber,  April 22, 2007 1:44 PM  Follow-up for Phone Call        she needs to be really careful while traveling to keep her legs moving and stop frequently to avoid blood clots it may raise blood pressure like the pill can- so needs to be short term Follow-up by: Judith Part MD,  April 22, 2007 1:55 PM  Additional Follow-up for Phone Call Additional follow up Details #1::        Advised patient ..................................................................Marland KitchenMarcelle Smiling Susanne Baumgarner  April 22, 2007 2:12 PM

## 2010-09-23 NOTE — Progress Notes (Signed)
Summary: BP elevated  Phone Note Call from Patient Call back at [phone]   Caller: Patient Summary of Call: Pt's BP today is 154/112, 153/108 and she is feeling some dizzy and light headed.  she has sinus drainage so has been taking otc meds- delsym, aleve, I told her these meds shouldnt make her BP go up.  She isnt taking any BP medicine now.  Please advise on what to do. Initial call taken by: Lowella Petties CMA,  October 03, 2009 2:34 PM  Follow-up for Phone Call        Aleve may be affecting her negatively...avoid. See Dr Milinda Antis if sxs cont into AM. Her BP was high when recently seen. Is she back on Tosemide?  She may need something added for BP control. Follow-up by: Shaune Leeks MD,  October 03, 2009 2:36 PM  Additional Follow-up for Phone Call Additional follow up Details #1::        Advised pt, she is back on torsemide since her last office visit..  I advised pt and told her that I would send this note to Dr Milinda Antis to see if she can be worked in on friday. Additional Follow-up by: Lowella Petties CMA,  October 03, 2009 2:51 PM    Additional Follow-up for Phone Call Additional follow up Details #2::    Thank you. Shaune Leeks MD  October 03, 2009 2:59 PM   I want her to start another bp med in addn to what she is taking please call in lisinopril px written on EMR for call in  follow up with me next week update me if any side eff or problems  Follow-up by: Judith Part MD,  October 03, 2009 4:17 PM  Additional Follow-up for Phone Call Additional follow up Details #3:: Details for Additional Follow-up Action Taken: Patient notified as instructed by telephone. Pt scheduled appt with Dr Milinda Antis on 10/11/09 at 8:45am. Nurse visti was cancelled. Medication phoned to [facility] in [facility] pharmacy as instructed. Additional Follow-up by: Lewanda Rife LPN,  October 03, 2009 4:38 PM  New/Updated Medications: LISINOPRIL 10 MG TABS (LISINOPRIL) 1 by mouth once  daily Prescriptions: LISINOPRIL 10 MG TABS (LISINOPRIL) 1 by mouth once daily  #30 x 1   Entered and Authorized by:   Judith Part MD   Signed by:   Judith Part MD on 10/03/2009   Method used:   Telephoned to ...       [facility] (retail)       [address]       [place], [place]  [address]       Ph: [phone]       Fax: [phone]   RxID:   [id number]

## 2010-09-23 NOTE — Progress Notes (Signed)
Summary: neck pain   Phone Note Call from Patient Call back at Home Phone [phone]   Caller: Patient Call For: Judith Part MD Summary of Call: Patient says that she is having  neck pain. It started on Thursday, she says that she did not do anything to cause the pain she just thought it was stress or the way she slept the night before, but the paini has not gotten any better. She says it feels stiff. Patient says that she has not taken anything for the pain.  Patient wants to know if she can have a muscle relaxer called in for her to [facility] in [place].  Initial call taken by: Melody Comas,  September 23, 2009 11:51 AM  Follow-up for Phone Call        I would actually recommend short course of nsaid instead of muscle relaxer try aleve 2 pills with food two times a day for 3 days heat on neck/ a foam cervical support pillow can also help  f/u if not improved  Follow-up by: Judith Part MD,  September 23, 2009 12:07 PM  Additional Follow-up for Phone Call Additional follow up Details #1::        Advised pt, she says she has already tried advil and tylenol and is using heat and these things arent helping.  then she needs to be seen- sched appt  can call in short supply of flexeril- warn of sedation px written on EMR for call in  Additional Follow-up by: Lowella Petties CMA,  September 23, 2009 12:26 PM    Additional Follow-up for Phone Call Additional follow up Details #2::    Advised pt, appt made, med called to [facility]. Follow-up by: Lowella Petties CMA,  September 23, 2009 2:11 PM  New/Updated Medications: FLEXERIL 10 MG TABS (CYCLOBENZAPRINE HCL) 1/2 to 1 by mouth up to three times a day as needed neck pain Prescriptions: FLEXERIL 10 MG TABS (CYCLOBENZAPRINE HCL) 1/2 to 1 by mouth up to three times a day as needed neck pain  #15 x 0   Entered and Authorized by:   Judith Part MD   Signed by:   Judith Part MD on 09/23/2009   Method used:   Telephoned  to ...       [facility] (retail)       [address]       [place], [place]  [address]       Ph: [phone]       Fax: [phone]   RxID:   [id number]

## 2010-09-23 NOTE — Letter (Signed)
Summary: [place] [employer] Medical Information Form  [place] [employer] Medical Information Form   Imported By: Beau Fanny 03/26/2008 16:12:50  _____________________________________________________________________  External Attachment:    Type:   Image     Comment:   External Document

## 2010-09-23 NOTE — Assessment & Plan Note (Signed)
Summary: CPX WITH PAP/JRR   Vital Signs:  Patient profile:   45 year old female Height:      65.75 inches Weight:      184.25 pounds BMI:     30.07 Temp:     98 degrees F oral Pulse rate:   84 / minute Pulse rhythm:   regular BP sitting:   118 / 70  (left arm) Cuff size:   large  Vitals Entered By: Lewanda Rife LPN (April 14, 2010 2:42 PM) CC: 30 minute ck up  with pap LMP 04/04/10   History of Present Illness: here for wellness/ gyn exam  is feeling good   is having trouble with L foot pain  worse in am  comes and goes  is wearing a pair of floppy shoes and flip flops  was walking a lot when traveling -- better with tennis shoes    wt is up 3 lb with bmi 30  HTN well controlled with 118/70 today  lipids slt better with trig 50 and HDL 64 and LDL 136 (was 140) diet is not as good as it should be  some red meat / some egg yolks and butter and desserts   pap 11/10-- was ok on 2nd check -- was ascus without hpv on first check  still having periods -- a lot spotting afterwards  is tolerable  is having more moodiness -- thinks is hormonal , negative thoughts and much worse around menses  week of period is bad   mam 8/10 self exam- no lumps or changes   Td 8/10  indigestion is worse-- has a lot of trouble with it at night on regular basis      Allergies: 1)  ! Mevacor 2)  ! Pcn  Past History:  Past Medical History: Last updated: 12/21/2006 Hyperlipidemia Hypertension  Past Surgical History: Last updated: 03/20/2008 C-S Hernia repain TSA 03/14/08 mammogram and [place]-- R breast mass   Family History: Last updated: 08/17/2007 parents both HTN, OA GM had some heart probs- implantible defibrillator MGM with breast ca  Social History: Last updated: 03/20/2008 non smoker Marital Status: Married Children: 2 Occupation: adlministrative  in Editor, commissioning care  Risk Factors: Caffeine Use: 2 (03/20/2008) Exercise: no  (03/20/2008)  Risk Factors: Smoking Status: never (12/21/2006) Passive Smoke Exposure: yes (03/20/2008)  Review of Systems General:  Denies fatigue, loss of appetite, and malaise. Eyes:  Denies blurring and eye pain. CV:  Denies chest pain or discomfort, palpitations, and swelling of feet. Resp:  Denies cough and shortness of breath. GI:  Denies change in bowel habits and indigestion. GU:  Denies abnormal vaginal bleeding, discharge, dysuria, and urinary frequency. MS:  Complains of joint pain and stiffness; denies joint redness, joint swelling, cramps, and muscle weakness. Derm:  Denies itching, lesion(s), poor wound healing, and rash. Neuro:  Denies numbness and tingling. Psych:  Denies anxiety and depression. Endo:  Denies cold intolerance, excessive thirst, excessive urination, and heat intolerance. Heme:  Denies abnormal bruising and bleeding.  Physical Exam  General:  overweight but generally well appearing  Head:  normocephalic, atraumatic, and no abnormalities observed.   Eyes:  vision grossly intact, pupils equal, pupils round, and pupils reactive to light.  no conjunctival pallor, injection or icterus  Ears:  R ear normal and L ear normal.   Nose:  no nasal discharge.   Mouth:  pharynx pink and moist.   Neck:  supple with full rom and no masses or thyromegally, no JVD or  carotid bruit  Chest Wall:  No deformities, masses, or tenderness noted. Lungs:  Normal respiratory effort, chest expands symmetrically. Lungs are clear to auscultation, no crackles or wheezes. Heart:  Normal rate and regular rhythm. S1 and S2 normal without gallop, murmur, click, rub or other extra sounds. Abdomen:  Bowel sounds positive,abdomen soft and non-tender without masses, organomegaly or hernias noted., no renal bruits  Msk:  No deformity or scoliosis noted of thoracic or lumbar spine.  no foot deformity or tenderness over either foot -- bony or calcaneous nl rom toes and feet Pulses:  R and L  carotid,radial,femoral,dorsalis pedis and posterior tibial pulses are full and equal bilaterally Extremities:  No clubbing, cyanosis, edema, or deformity noted with normal full range of motion of all joints.   Neurologic:  strength normal in all extremities, sensation intact to light touch, gait normal, and DTRs symmetrical and normal.   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 Take one by mouth daily  Hgb: 11.3 (01/03/2007)   Hct: 33.9 (01/03/2007)   Platelets: 357 (01/03/2007) RBC: 3.92 (01/03/2007)   RDW: 18.1 (01/03/2007)   WBC: 8.2 (01/03/2007) MCV: 86.5 (01/03/2007)   MCHC: 33.5 (01/03/2007)  Problem # 3:  HYPERTENSION (ICD-401.9)  good control med refilled disc diet and exercise  Her updated medication list for this problem includes:    Torsemide 20 Mg Tabs (Torsemide) .Marland Kitchen... Take one by mouth daily    Lisinopril 20 Mg Tabs (Lisinopril) .Marland Kitchen... 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 1 by mouth once daily in am  Orders: Prescription Created Electronically [id number])  Complete Medication List: 1)  Klor-con M20 20 Meq Cr-tabs (Potassium chloride crys cr) .Marland Kitchen.. 1 by mouth once daily 2)  Torsemide 20 Mg Tabs (Torsemide) .... Take one by mouth daily 3)  Ferrous Sulfate 325 (65 Fe) Mg Tabs (Ferrous sulfate) .... Take one by mouth daily 4)  One Daily Tabs (Multiple vitamin) .... Take 1 tablet by mouth once a day 5)  Flexeril 10 Mg Tabs (Cyclobenzaprine hcl) .... 1/2 to 1 by mouth up to three times a day as needed neck pain 6)  Lisinopril 20 Mg Tabs (Lisinopril) .Marland Kitchen.. 1 by mouth once daily 7)  Omeprazole 20 Mg Cpdr (Omeprazole) .Marland Kitchen.. 1 by mouth once daily in am  Other Orders: TB Skin Test [phone]) Admin 1st Vaccine (  60109) Radiology Referral (Radiology)  Patient Instructions: 1)  you can raise your HDL (good cholesterol) by increasing exercise and eating omega 3 fatty acid supplement like fish oil or flax seed oil over the counter 2)  you can lower LDL (bad cholesterol) by limiting saturated fats in diet like red meat, fried foods, egg yolks, fatty breakfast meats, high fat dairy products and shellfish  3)  schedule gyn exam/ pap for early december (15 min is ok)  4)  PPD today - need to schedule nurse visit to read that on wed please  5)  wear hard soled shoes and don't go barefoot , ice foot twice daily for 5 minutes when you can  6)  we will schedule mammogram at check out  7)  start omeprazole (generic prilosec) each am about 30 min before breakfast  8)  if reflux does not improve with that let me know  Prescriptions: OMEPRAZOLE 20 MG CPDR (OMEPRAZOLE) 1 by mouth once daily in am  #30 x 11   Entered and Authorized by:   Judith Part MD   Signed by:   Judith Part MD on  04/14/2010   Method used:   Electronically to        [facility] (retail)       [address]       [place], [place]  [address]       Ph: [phone]       Fax: [phone]   RxID:   [id number] TORSEMIDE 20 MG TABS (TORSEMIDE) Take one by mouth daily  #30 x 11   Entered and Authorized by:   Judith Part MD   Signed by:   Judith Part MD on 04/14/2010   Method used:   Electronically to        [facility] (retail)       [address]       [place], [place]  [address]       Ph: [phone]       Fax: [phone]   RxID:   [id number]   Current Allergies (reviewed today): ! MEVACOR ! PCN      Immunizations Administered:  PPD Skin Test:    Vaccine Type: PPD    Site: left forearm    Mfr: Sanofi Pasteur    Dose: 0.1 ml    Route: ID    Given by: Lewanda Rife LPN    Exp. Date: 06/26/2011    Lot #: [id number]  PPD Results    Date of reading: 04/17/2010    Results: < 5mm    Interpretation: negative   Form faxed to [phone] as instructed, sent for scanning and pt given a copy of form also.Lewanda Rife LPN  April 17, 2010 9:18 AM

## 2010-09-23 NOTE — Assessment & Plan Note (Signed)
Summary: ST/CLE   Vital Signs:  Patient Profile:   45 Years Old Female Height:     65.5 inches (166.37 cm) Weight:      185 pounds Temp:     98.9 degrees F oral Pulse rate:   66 / minute Pulse rhythm:   regular BP sitting:   142 / 90  (left arm) Cuff size:   regular  Vitals Entered By: Mervin Hack CMA (August 14, 2008 2:53 PM)                 Chief Complaint:  sore throat.  History of Present Illness: Here for ST--onset 5-6d, nasal congestion, nose bleed, cough--productive at times, no fever or chills --taking nyquil, delsym--does not hold her  --has not missed work    Current Allergies (reviewed today): ! MEVACOR (LOVASTATIN) ! PCN     Review of Systems      See HPI   Physical Exam  General:     alert, well-developed, well-nourished, and well-hydrated.   Ears:     TMs retracted with increased fluid bilat Nose:     no airflow obstruction, mucosal erythema, and mucosal edema.  sinuses +,- Mouth:     no exudates and pharyngeal erythema.   Lungs:     moist harsh cohgh, no crackles and no wheezes.   Cervical Nodes:     shotty tonsilar nodes, no anterior cervical adenopathy and no posterior cervical adenopathy.   Psych:     normally interactive and good eye contact.      Impression & Recommendations:  Problem # 1:  BRONCHITIS-ACUTE (ICD-466.0) Assessment: New continue comfort care measures: increase po fluids, rest, tylenol or IBP as needed will start on z pac willl use tussinex at hs as needed cough as needed  see  back in 7-10d if not improved Her updated medication list for this problem includes:    Zithromax Z-pak 250 Mg Tabs (Azithromycin) ..... Use as diredted    Tussionex Pennkinetic Er 8-10 Mg/49ml Lqcr (Chlorpheniramine-hydrocodone) .Marland Kitchen... Take 1 tsp at bedtime as needed cough   Complete Medication List: 1)  Klor-con 20 Meq Pack (Potassium chloride) .... Take one by mouth daily 2)  Torsemide 20 Mg Tabs (Torsemide) .... Take one by  mouth daily 3)  Ferrous Sulfate 325 (65 Fe) Mg Tabs (Ferrous sulfate) .... Take one by mouth daily 4)  Mvi  .Marland Kitchen.. 1 by mouth once daily 5)  Zithromax Z-pak 250 Mg Tabs (Azithromycin) .... Use as diredted 6)  Tussionex Pennkinetic Er 8-10 Mg/26ml Lqcr (Chlorpheniramine-hydrocodone) .... Take 1 tsp at bedtime as needed cough    Prescriptions: TUSSIONEX PENNKINETIC ER 8-10 MG/5ML LQCR (CHLORPHENIRAMINE-HYDROCODONE) take 1 tsp at bedtime as needed cough Brand medically necessary #14ml x 0   Entered and Authorized by:   Gildardo Griffes FNP   Signed by:   Gildardo Griffes FNP on 08/14/2008   Method used:   Print then Give to Patient   RxID:   [id number] ZITHROMAX Z-PAK 250 MG TABS (AZITHROMYCIN) use as diredted  #1 x 0   Entered and Authorized by:   Gildardo Griffes FNP   Signed by:   Gildardo Griffes FNP on 08/14/2008   Method used:   Print then Give to Patient   RxID:   [id number]  ] Current Allergies (reviewed today): ! MEVACOR (LOVASTATIN) ! PCN
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
## 2010-09-23 NOTE — Letter (Signed)
Summary: Medical Eval Form/[place] [facility]  Medical Eval Form/[place] [facility] [employer] [facility]   Imported By: Lanelle Bal 04/23/2010 10:43:31  _____________________________________________________________________  External Attachment:    Type:   Image     Comment:   External Document

## 2010-09-23 NOTE — Assessment & Plan Note (Signed)
Summary: ROA VISIT FOR 4 WEEK FOLLOW-UP/JRR   Vital Signs:  Patient profile:   45 year old female Height:      65.5 inches Weight:      181 pounds BMI:     29.77 Temp:     98 degrees F oral Pulse rate:   76 / minute Pulse rhythm:   regular BP sitting:   118 / 84  (left arm) Cuff size:   large  Vitals Entered By: Lewanda Rife LPN (November 01, 2009 8:49 AM)  History of Present Illness: here for f/u for HTN   feels great overall -- is still tired   last visit had some inc bp here and at home - so inc her lisinopril to 20  also a lot of stress at that time  this is improved a bit   no down time -- still lots of soccer games   wt is down 3 lb -- has been walking regularly   Allergies: 1)  ! Mevacor 2)  ! Pcn  Past History:  Past Medical History: Last updated: 12/21/2006 Hyperlipidemia Hypertension  Past Surgical History: Last updated: 03/20/2008 C-S Hernia repain TSA 03/14/08 mammogram and [place]-- R breast mass   Family History: Last updated: 08/17/2007 parents both HTN, OA GM had some heart probs- implantible defibrillator MGM with breast ca  Social History: Last updated: 03/20/2008 non smoker Marital Status: Married Children: 2 Occupation: adlministrative  in Editor, commissioning care  Risk Factors: Caffeine Use: 2 (03/20/2008) Exercise: no (03/20/2008)  Risk Factors: Smoking Status: never (12/21/2006) Passive Smoke Exposure: yes (03/20/2008)  Review of Systems General:  Denies fatigue, fever, loss of appetite, and malaise. Eyes:  Denies blurring and eye pain. CV:  Denies chest pain or discomfort, palpitations, and shortness of breath with exertion. Resp:  Denies cough and wheezing. GI:  Denies abdominal pain, bloody stools, change in bowel habits, and indigestion. MS:  Denies joint swelling. Derm:  Denies itching, poor wound healing, and rash. Neuro:  Denies headaches, numbness, and tingling. Heme:  Denies abnormal bruising and  bleeding.  Physical Exam  General:  Well-developed,well-nourished,in no acute distress; alert,appropriate and cooperative throughout examination Head:  normocephalic, atraumatic, and no abnormalities observed.   Eyes:  vision grossly intact, pupils equal, pupils round, and pupils reactive to light.   Mouth:  pharynx pink and moist.   Neck:  supple with full rom and no masses or thyromegally, no JVD or carotid bruit  Lungs:  Normal respiratory effort, chest expands symmetrically. Lungs are clear to auscultation, no crackles or wheezes. Heart:  Normal rate and regular rhythm. S1 and S2 normal without gallop, murmur, click, rub or other extra sounds. Extremities:  No clubbing, cyanosis, edema, or deformity noted with normal full range of motion of all joints.   Neurologic:  sensation intact to light touch, gait normal, and DTRs symmetrical and normal.   Skin:  Intact without suspicious lesions or rashes Cervical Nodes:  No lymphadenopathy noted Psych:  normal affect, talkative and pleasant    Impression & Recommendations:  Problem # 1:  HYPERTENSION (ICD-401.9) Assessment Improved  much imp with inc in lisinopril and walking /good habits  no changes planned  did refil her K  f/u summer for labs and PE  Her updated medication list for this problem includes:    Torsemide 20 Mg Tabs (Torsemide) .Marland Kitchen... Take one by mouth daily    Lisinopril 20 Mg Tabs (Lisinopril) .Marland Kitchen... 1 by mouth once daily  Orders: Prescription Created Electronically [phone]  Complete Medication List: 1)  Klor-con 20 Meq Pack (Potassium chloride) .... Take one by mouth daily 2)  Torsemide 20 Mg Tabs (Torsemide) .... Take one by mouth daily 3)  Ferrous Sulfate 325 (65 Fe) Mg Tabs (Ferrous sulfate) .... Take one by mouth daily 4)  One Daily Tabs (Multiple vitamin) .... Take 1 tablet by mouth once a day 5)  Flexeril 10 Mg Tabs (Cyclobenzaprine hcl) .... 1/2 to 1 by mouth up to three times a day as needed neck pain 6)   Lisinopril 20 Mg Tabs (Lisinopril) .Marland Kitchen.. 1 by mouth once daily  Patient Instructions: 1)  no change in medicines 2)  keep up the walking 3)  update me if any problems  4)  schedule fasting labs in august and then PE wellness panel and lippids v70.0, 401.1  Prescriptions: KLOR-CON 20 MEQ PACK (POTASSIUM CHLORIDE) Take one by mouth daily  #30 x 5   Entered and Authorized by:   Judith Part MD   Signed by:   Judith Part MD on 11/01/2009   Method used:   Electronically to        [facility] (retail)       [address]       [place], [place]  [address]       Ph: [phone]       Fax: [phone]   RxID:   [id number]   Current Allergies (reviewed today): ! MEVACOR ! PCN

## 2010-09-23 NOTE — Miscellaneous (Signed)
Summary: mammogram screening   Clinical Lists Changes  Observations: Added new observation of MAMMO DUE: 04/2011 (04/22/2010 10:02) Added new observation of MAMMOGRAM: normal (04/21/2010 10:03)      Preventive Care Screening  Mammogram:    Date:  04/21/2010    Next Due:  04/2011    Results:  normal

## 2010-09-23 NOTE — Assessment & Plan Note (Signed)
Summary: CHECK PLACE ON BACK OF FOOT/CLE   Vital Signs:  Patient profile:   45 year old female Height:      65.5 inches Weight:      183.50 pounds BMI:     30.18 Temp:     98.7 degrees F oral Pulse rate:   88 / minute Pulse rhythm:   regular BP sitting:   142 / 100  (left arm) Cuff size:   regular  Vitals Entered By: Delilah Shan CMA Duncan Dull) (August 29, 2009 9:58 AM) CC: 1.  Check place on back of foot.  2.  Not feeling well lately, very tired, even with significant amounts of sleep.   History of Present Illness: 45 yo here with multiple complaints.  Fatigue-  ongoing for a couple of weeks.  Can sleep all day but still feel tired. No problems sleeping at night. No SOB, no CP, no LE edema, no night sweats, fevers or weight loss. Mild DOE. Has h/o iron deficiency anemia.  Not currently taking iron.  Bump on bottom of right foot- not hurting, but wanted to make sure it wasn't related to diabetes. No tingling in feet. Not a diabetic but was once told her a1c was elevated. wants a repeat a1c today.  Watery eyes- New Years Day opened a can of Ginger Ale and it srayed in her eyes. Since then, eyes are watering constantly. No blurred vision, eye pain, or photophobia.  Current Medications (verified): 1)  Klor-Con 20 Meq Pack (Potassium Chloride) .... Take One By Mouth Daily 2)  Torsemide 20 Mg Tabs (Torsemide) .... Take One By Mouth Daily 3)  Ferrous Sulfate 325 (65 Fe) Mg Tabs (Ferrous Sulfate) .... Take One By Mouth Daily 4)  One Daily  Tabs (Multiple Vitamin) .... Take 1 Tablet By Mouth Once A Day  Allergies: 1)  ! Mevacor (Lovastatin) 2)  ! Pcn  Review of Systems      See HPI General:  Complains of fatigue; denies chills, fever, loss of appetite, weakness, and weight loss. Eyes:  Denies blurring, double vision, eye irritation, halos, light sensitivity, red eye, and vision loss-both eyes. ENT:  Denies nasal congestion. CV:  Denies chest pain or discomfort and  difficulty breathing at night. Resp:  Complains of shortness of breath; denies cough. GI:  Denies abdominal pain, bloody stools, and change in bowel habits. GU:  Denies abnormal vaginal bleeding. MS:  Denies joint pain, joint redness, joint swelling, and loss of strength. Derm:  Denies rash.  Physical Exam  General:  Well-developed,well-nourished,in no acute distress; alert,appropriate and cooperative throughout examination Eyes:  vision grossly intact, pupils equal, pupils round, pupils reactive to light, and no injection.   Mouth:  pharynx pink and moist.   some throat clearing and post nasal drip Lungs:  Normal respiratory effort, chest expands symmetrically. Lungs are clear to auscultation, no crackles or wheezes. Heart:  normal rate, regular rhythm, and no murmur.   Abdomen:  Bowel sounds positive,abdomen soft and non-tender without masses, organomegaly or hernias noted. no renal bruits  Msk:  small callous on right midfoot Skin:  Intact without suspicious lesions or rashes Psych:  normal affect, talkative and pleasant    Impression & Recommendations:  Problem # 1:  CALLUS, FOOT (ICD-700) Assessment New Reassurance provided, use OTC callous preparations.  Problem # 2:  FATIGUE (ICD-780.79) Assessment: New Etiology unknown.  Will check labs today to r/o reversible causes.  If none found, will need further work up, perhaps even cardiac echo although  no signs of heart failure. Orders: Venipuncture (44010) TLB-B12 + Folate Pnl (82746_82607-B12/FOL) TLB-CBC Platelet - w/Differential (85025-CBCD) TLB-TSH (Thyroid Stimulating Hormone) (84443-TSH) TLB-BMP (Basic Metabolic Panel-BMET) (80048-METABOL)  Problem # 3:  INJURY TO UNSPECIFIED OPTIC NERVE AND PATHWAYS (ICD-950.9) Assessment: New No obvious signs of injury, optho exam normal.  Has opthamology appt today.  Problem # 4:  Hx of METABOLIC SYNDROME X (ICD-277.7) Aksed that we check a1c today. Orders: TLB-A1C / Hgb A1C  (Glycohemoglobin) (83036-A1C)  Complete Medication List: 1)  Klor-con 20 Meq Pack (Potassium chloride) .... Take one by mouth daily 2)  Torsemide 20 Mg Tabs (Torsemide) .... Take one by mouth daily 3)  Ferrous Sulfate 325 (65 Fe) Mg Tabs (Ferrous sulfate) .... Take one by mouth daily 4)  One Daily Tabs (Multiple vitamin) .... Take 1 tablet by mouth once a day  Current Allergies (reviewed today): ! MEVACOR (LOVASTATIN) ! PCN

## 2010-09-23 NOTE — Progress Notes (Signed)
Summary: chest pressure last pm   Phone Note Call from Patient Call back at [phone]   Caller: Patient Call For: Mischelle Reeg Summary of Call: P says she had an episode of feeling chest pressure that rediated throught to her back it lasted about 3 hours, she took some tums and thought it went away but then it came back. She did not have any other symptoms. She says he bp has been elevated to 140's/90's recently. She says she has the same thing happen yesterday am but only lasted minutes, she feels a little pressure today. She wants to discuss with you. I tried to make appt but she was not avail at times I offered and you do not have openings at times seh is avail. she does not want to see another dr. Initial call taken by: Liane Comber,  August 09, 2007 9:47 AM  Follow-up for Phone Call        if this happens again and worsens or does not resolve- especially if sob/sweat or nausea- needs to go to ER otherwise needs to f/u for this and the blood pressure  Follow-up by: Judith Part MD,  August 09, 2007 1:05 PM  Additional Follow-up for Phone Call Additional follow up Details #1::        Advised pt, appt made for 08/17/07 Additional Follow-up by: Lowella Petties,  August 09, 2007 2:22 PM

## 2010-09-23 NOTE — Letter (Signed)
Summary: Results Follow up Letter  [employer] [facility]  [address] [place], [place] [address]   Phone: [phone]  Fax: [phone]    04/22/2010 MRN: [id number]    Rachael Russell [address] [place], [place]  [address]    Dear [name],  The following are the results of your recent test(s):  Test         Result    Pap Smear:        Normal _____  Not Normal _____ Comments: ______________________________________________________ Cholesterol: LDL(Bad cholesterol):         Your goal is less than:         HDL (Good cholesterol):       Your goal is more than: Comments:  ______________________________________________________ Mammogram:        Normal __X___  Not Normal _____ Comments:Repeat in one year. Radiologist did note cysts were seen but noted cysts were stable.  ___________________________________________________________________ Hemoccult:        Normal _____  Not normal _______ Comments:    _____________________________________________________________________ Other Tests:    We routinely do not discuss normal results over the telephone.  If you desire a copy of the results, or you have any questions about this information we can discuss them at your next office visit.   Sincerely,    Idamae Schuller Dakwan Pridgen,MD  MT/ri

## 2010-09-23 NOTE — Miscellaneous (Signed)
Summary: mammogram screening   Clinical Lists Changes  Observations: Added new observation of MAMMO DUE: 03/2010 (04/05/2009 12:38) Added new observation of MAMMOGRAM: normal (04/05/2009 12:38)      Preventive Care Screening  Mammogram:    Date:  04/05/2009    Next Due:  03/2010    Results:  normal

## 2010-09-23 NOTE — Consult Note (Signed)
Summary: [facility]/Post Ultrasound Guided Core Biopsy Office V  [facility]/Post Ultrasound Guided Core Biopsy Office Visit/Dr. Yolanda Bonine   Imported By: Eleonore Chiquito 03/28/2008 15:56:05  _____________________________________________________________________  External Attachment:    Type:   Image     Comment:   External Document

## 2010-09-23 NOTE — Progress Notes (Signed)
Summary: Klor Con rx  Phone Note Call from Patient Call back at [phone]   Caller: Patient Call For: Judith Part MD Summary of Call: Dr Milinda Antis can we write a new rx for Klor Con  tablets instead of packs. Pt takes one tablet daily. I will call to pharmacy. Thank you. Initial call taken by: Lewanda Rife LPN,  December 04, 2009 12:07 PM  Follow-up for Phone Call        px written on EMR for call in  Follow-up by: Judith Part MD,  December 04, 2009 12:37 PM  Additional Follow-up for Phone Call Additional follow up Details #1::        Patient notified as instructed by telephone. Medication phoned to [facility] pharmacy as instructed. d/c order for Klor con packets. If pt ask for [facility] at [place] she will take packets back and give pt tablets.Lewanda Rife LPN  December 04, 2009 12:49 PM     New/Updated Medications: KLOR-CON M20 20 MEQ CR-TABS (POTASSIUM CHLORIDE CRYS CR) 1 by mouth once daily Prescriptions: KLOR-CON M20 20 MEQ CR-TABS (POTASSIUM CHLORIDE CRYS CR) 1 by mouth once daily  #30 x 11   Entered and Authorized by:   Judith Part MD   Signed by:   Lewanda Rife LPN on 96/29/5284   Method used:   Telephoned to ...       [facility] (retail)       [address]       [place], [place]  [address]       Ph: [phone]       Fax: [phone]   RxID:   [id number]

## 2010-09-23 NOTE — Progress Notes (Signed)
Summary: cough med    Phone Note Call from Patient Call back at [phone], cell [phone]   Caller: Patient Call For: tower Summary of Call: pt c/o cough for past 4 days she cannot sleep at night, she thinks its from sinuses/allergies b/c she gets it same time every year request rx for tussionex [facility] [place] Initial call taken by: Liane Comber,  May 26, 2007 10:24 AM  Follow-up for Phone Call        cannot px tussionex without visit- is controlled substance/narcotic if allergies are causing the problem- some benadryl at night would help drip/cough, and also may help sleep zyrtec may also help if no imp- f/u- or if facial pain/fever/prod cough/sob Follow-up by: Judith Part MD,  May 26, 2007 12:18 PM  Additional Follow-up for Phone Call Additional follow up Details #1::        LEFT MESSAGE ON MACHINE of cell ..................................................................Marland KitchenLiane Comber  May 26, 2007 2:30 PM  Advised patient.  ......................................................Marland KitchenNatasha Leightyn Cina May 27, 2007 9:00 AM

## 2010-09-25 NOTE — Miscellaneous (Signed)
Summary: Flowsheet update   Clinical Lists Changes  Observations: Added new observation of PAP DUE: 07/2011 (08/07/2010 9:45) Added new observation of PAP SMEAR: normal (08/01/2010 9:46)      Preventive Care Screening  Pap Smear:    Date:  08/01/2010    Next Due:  07/2011    Results:  normal     HPV test not performed due to lack of cells to perform the test.

## 2010-09-25 NOTE — Letter (Signed)
Summary: Results Follow up Letter  [place] at [facility]  [address] [place], [place] [address]   Phone: [phone]  Fax: [phone]    08/07/2010 MRN: [id number]  Rachael Russell [address] [place], [place]  [address]  Dear Ms. FOGARTY,  The following are the results of your recent test(s):  Test         Result    Pap Smear:        Normal __X___  Not Normal _____ Comments: Please be aware there were not enough cells test for HPV. Recommend a repeat PAP in 1 year. ______________________________________________________ Cholesterol: LDL(Bad cholesterol):         Your goal is less than:         HDL (Good cholesterol):       Your goal is more than: Comments:  ______________________________________________________ Mammogram:        Normal _____  Not Normal _____ Comments:  ___________________________________________________________________ Hemoccult:        Normal _____  Not normal _______ Comments:    _____________________________________________________________________ Other Tests:    We routinely do not discuss normal results over the telephone.  If you desire a copy of the results, or you have any questions about this information we can discuss them at your next office visit.   Sincerely,      Eustaquio Boyden, MD

## 2010-12-18 ENCOUNTER — Encounter: Payer: Self-pay | Admitting: Family Medicine

## 2010-12-19 ENCOUNTER — Encounter: Payer: Self-pay | Admitting: Family Medicine

## 2010-12-19 ENCOUNTER — Ambulatory Visit (INDEPENDENT_AMBULATORY_CARE_PROVIDER_SITE_OTHER): Payer: BC Managed Care – PPO | Admitting: Family Medicine

## 2010-12-19 DIAGNOSIS — K219 Gastro-esophageal reflux disease without esophagitis: Secondary | ICD-10-CM

## 2010-12-19 DIAGNOSIS — Z136 Encounter for screening for cardiovascular disorders: Secondary | ICD-10-CM

## 2010-12-19 DIAGNOSIS — R4589 Other symptoms and signs involving emotional state: Secondary | ICD-10-CM

## 2010-12-19 DIAGNOSIS — J309 Allergic rhinitis, unspecified: Secondary | ICD-10-CM | POA: Insufficient documentation

## 2010-12-19 DIAGNOSIS — F439 Reaction to severe stress, unspecified: Secondary | ICD-10-CM | POA: Insufficient documentation

## 2010-12-19 DIAGNOSIS — R42 Dizziness and giddiness: Secondary | ICD-10-CM

## 2010-12-19 DIAGNOSIS — R002 Palpitations: Secondary | ICD-10-CM

## 2010-12-19 DIAGNOSIS — N6019 Diffuse cystic mastopathy of unspecified breast: Secondary | ICD-10-CM

## 2010-12-19 MED ORDER — FLUTICASONE PROPIONATE 50 MCG/ACT NA SUSP: 2.0000 | Freq: Every day | NASAL | Status: DC

## 2010-12-19 MED ORDER — RANITIDINE HCL 150 MG PO TABS: 150.0000 mg | ORAL_TABLET | Freq: Two times a day (BID) | ORAL | Status: DC

## 2010-12-19 NOTE — Assessment & Plan Note (Signed)
Adding to many of her physical symptoms Ref to counseling Disc coping tech Very positive outlook F/u 2-3 wk

## 2010-12-19 NOTE — Progress Notes (Signed)
Subjective:    Patient ID: Rachael Russell, female    DOB: Dec 23, 1965, 45 y.o.   MRN: [id number]  HPI Here for dizziness and prior headache- poss from stress at work Also knot on R breast Palpitations when she is very stressed  No cp pslpitations not rel to the dizziness Trying to cut back on caffiene   Wt is down 8lb from last visit -- some from nausea and dec apetite   Severe stress -- at work- allegations against them for child related issue She has been interviewed   Had a headache -- gone now , thought it was sinus related / took some otc med over the weekend- ? Worked - took benadryl and dayquil and nyquil  No cold symptoms Feels like back of throat is tickly  No colored d/c and no fever Is stuffy  Chest is sore to the touch also . And sore to take a deep breath    Dizziness- is a light headed feeling  Starts early in am and late at night  Has been nauseated over weekend -- better now  Vomited tues night  Palpitations and anxious feeling on wed night  No meds for this  Used tums for the nausea   Not pregnant- husb had vasectomy  More firmness lateral R breast - has fibrocystic dz  Avoiding caffiene    Past Medical History  Diagnosis Date  . Hyperlipidemia   . Hypertension   . Breast mass, right 03/14/08    mammogram and ultrasound   Past Surgical History  Procedure Date  . Cesarean section   . Hernia repair   . Tsa     reports that she has never smoked. She does not have any smokeless tobacco history on file. Her alcohol and drug histories not on file. family history includes Arthritis in her father and mother; Cancer in her maternal grandmother; and Hypertension in her father and mother. Allergies  Allergen Reactions  . Lovastatin     REACTION: chest pain  . Penicillins     REACTION: ? if allergic as child        Review of Systems  Constitutional: Positive for appetite change and fatigue. Negative for fever and unexpected weight change.  HENT:  Positive for congestion and postnasal drip. Negative for ear pain and neck pain.   Eyes: Negative for pain and visual disturbance.  Respiratory: Negative for cough, shortness of breath and wheezing.   Cardiovascular: Positive for palpitations. Negative for chest pain and leg swelling.  Gastrointestinal: Negative for abdominal pain and diarrhea.  Genitourinary: Negative for dysuria, urgency and frequency.  Musculoskeletal: Negative for myalgias and back pain.  Skin: Negative for pallor and rash.  Neurological: Positive for light-headedness and headaches. Negative for tremors, seizures, syncope, speech difficulty and weakness.  Hematological: Negative for adenopathy. Does not bruise/bleed easily.  Psychiatric/Behavioral: Positive for decreased concentration. Negative for suicidal ideas. The patient is nervous/anxious.        Objective:   Physical Exam  Constitutional: She appears well-developed and well-nourished.       overwt and well appearing   HENT:  Head: Normocephalic and atraumatic.  Right Ear: External ear normal.  Nose: Nose normal.  Mouth/Throat: Oropharynx is clear and moist.       No sinus tenderness   Eyes: Conjunctivae and EOM are normal. Pupils are equal, round, and reactive to light.  Neck: Normal range of motion. Neck supple. Carotid bruit is not present. No thyromegaly present.  Cardiovascular: Normal rate, regular  rhythm, normal heart sounds and intact distal pulses.   No murmur heard. Pulmonary/Chest: Effort normal. No respiratory distress. She has no wheezes.  Abdominal: Soft. Bowel sounds are normal. She exhibits no mass. There is no tenderness.  Genitourinary: No breast swelling, tenderness, discharge or bleeding.       Some palpable dense breast tissue R lateral- is ropey in shape and consistency- consistent with fibrocystic breast change  No discreet masses   Musculoskeletal: Normal range of motion. She exhibits no edema and no tenderness.  Lymphadenopathy:      She has no cervical adenopathy.  Neurological: She is alert. She displays normal reflexes. No cranial nerve deficit. Coordination normal.  Skin: Skin is warm and dry. No rash noted. No erythema. No pallor.  Psychiatric:       Anxious and almost tearful  Good eye contact and comm skills           Assessment & Plan:

## 2010-12-19 NOTE — Assessment & Plan Note (Signed)
Suspect due to allergy congestion , perhaps also stress Trial of flonase  Update if worse or other symptoms

## 2010-12-19 NOTE — Assessment & Plan Note (Signed)
Much worse with stress and some gastritis symptoms Trial of zantac 150 and update Disc diet changes F/u 2-3 wk

## 2010-12-19 NOTE — Patient Instructions (Addendum)
We will refer you to a counselor for stress management  EKG is normal today Keep doing self breast exams and avoid caffiene  If breast lump gets bigger or changes let me know  Try flonase for congestion and dizziness  Try ranitidine (zantac)  for stomach acid- this should help the nausea and stomach tenderness Follow up with me in 2-3 weeks to see how you are doing

## 2010-12-19 NOTE — Assessment & Plan Note (Signed)
Area of pt's concern in R lateral breast feels like ropey fibrocystic tissue without discreet mass Will watch carefully for growth or change Avoid caff F/u 2-3 wk Mam due in aug

## 2010-12-19 NOTE — Assessment & Plan Note (Addendum)
Suspect due to severe stress  ekg today - reassuring with nsr rate of 70 and no acute changes  Chest tenderness has resolved  Ref to counseling Update if symptoms return Avoid caff

## 2010-12-19 NOTE — Assessment & Plan Note (Signed)
Add flonase daily for congestion and etd F/u 2-3 wk Update earlier if symptoms worsen

## 2011-01-08 ENCOUNTER — Telehealth: Payer: Self-pay | Admitting: *Deleted

## 2011-01-08 NOTE — Telephone Encounter (Signed)
Noted  

## 2011-01-08 NOTE — Telephone Encounter (Signed)
Pt was told at last office visit to schedule appt with counselor before her next follow up with you.  She has not been able to get in with counselor yet so she cancelled her appt with you for tomorrow, she will continue to try and schedule appt with counselor.

## 2011-01-09 ENCOUNTER — Ambulatory Visit: Payer: BC Managed Care – PPO | Admitting: Family Medicine

## 2011-02-18 ENCOUNTER — Encounter: Payer: Self-pay | Admitting: Family Medicine

## 2011-02-18 ENCOUNTER — Ambulatory Visit (INDEPENDENT_AMBULATORY_CARE_PROVIDER_SITE_OTHER)
Admission: RE | Admit: 2011-02-18 | Discharge: 2011-02-18 | Disposition: A | Discharge status: Home / Self Care | Payer: BC Managed Care – PPO | Source: Ambulatory Visit | Attending: Family Medicine | Admitting: Family Medicine

## 2011-02-18 ENCOUNTER — Ambulatory Visit (INDEPENDENT_AMBULATORY_CARE_PROVIDER_SITE_OTHER): Payer: BC Managed Care – PPO | Admitting: Family Medicine

## 2011-02-18 VITALS — BP 130/92 | HR 67 | Temp 98.4°F | Ht 65.0 in | Wt 184.0 lb

## 2011-02-18 DIAGNOSIS — M542 Cervicalgia: Secondary | ICD-10-CM

## 2011-02-18 DIAGNOSIS — S43499A Other sprain of unspecified shoulder joint, initial encounter: Secondary | ICD-10-CM

## 2011-02-18 DIAGNOSIS — S46819A Strain of other muscles, fascia and tendons at shoulder and upper arm level, unspecified arm, initial encounter: Secondary | ICD-10-CM

## 2011-02-18 MED ORDER — TIZANIDINE HCL 4 MG PO TABS: 4.0000 mg | ORAL_TABLET | Freq: Every evening | ORAL | Status: AC

## 2011-02-18 MED ORDER — HYDROCODONE-ACETAMINOPHEN 5-500 MG PO TABS: 1.0000 | ORAL_TABLET | Freq: Four times a day (QID) | ORAL | Status: AC | PRN

## 2011-02-18 MED ORDER — DICLOFENAC SODIUM 75 MG PO TBEC: 75.0000 mg | DELAYED_RELEASE_TABLET | Freq: Two times a day (BID) | ORAL | Status: AC

## 2011-02-19 ENCOUNTER — Telehealth: Payer: Self-pay | Admitting: *Deleted

## 2011-02-19 NOTE — Telephone Encounter (Signed)
Pt was seen yesterday.  She was told to call if she had any problems with numbness or tingling in her hands.  Today she is typing at her computer and she has tingling in her left hand and arm. She took one vicodin yesterday, which helped with the pain, she then took the other meds prescribed at 9 pm.  She is concerned about the numbness and tingling. Also, she said she checked with her insurance company and they want [place] to send them copy of your note from yesterday and other documents.  I told her that they will need to send [place] a request for information, along with her signed release and we can send them what they need.

## 2011-02-19 NOTE — Telephone Encounter (Signed)
Patient advised and will schedule a follow up in 1 month

## 2011-02-19 NOTE — Telephone Encounter (Signed)
She will continue to worsen over the next few days  Expect this  i would not anticipate getting better for a few weeks Cont with range of motion, heat as we discussed  Transient tingling would not alter my plan of care - would not worry too much, even if continuous would not alter plan yet
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
## 2011-02-24 ENCOUNTER — Telehealth: Payer: Self-pay | Admitting: *Deleted

## 2011-02-24 DIAGNOSIS — M542 Cervicalgia: Secondary | ICD-10-CM

## 2011-02-24 DIAGNOSIS — M549 Dorsalgia, unspecified: Secondary | ICD-10-CM

## 2011-02-24 NOTE — Telephone Encounter (Signed)
Patient called asking if she could speak with you. I asked if there was something I could help her with, she said that she prefers to speak with you directly and would only tell me that it is regarding an appt she had last week and her symptoms that she is having now. She is asking if you could call her.

## 2011-02-24 NOTE — Telephone Encounter (Signed)
Thanks

## 2011-02-24 NOTE — Telephone Encounter (Signed)
I spoke to pt - now she is having more tingling in arm and hand as well as lower back pain - she would like me to refer her to orthopedics  I will do ref and route to [facility]

## 2011-02-24 NOTE — Telephone Encounter (Signed)
Working on Referral to Orthopedic...cdavis 02-24-2011

## 2011-04-24 ENCOUNTER — Other Ambulatory Visit: Payer: Self-pay | Admitting: Family Medicine

## 2011-04-24 NOTE — Telephone Encounter (Signed)
[facility] [place] electronically request refill torsemide 20 mg #30 x 3.

## 2011-05-06 ENCOUNTER — Encounter: Payer: Self-pay | Admitting: Family Medicine

## 2011-05-07 ENCOUNTER — Encounter: Payer: Self-pay | Admitting: *Deleted

## 2011-05-11 ENCOUNTER — Encounter: Payer: Self-pay | Admitting: Family Medicine

## 2011-05-11 ENCOUNTER — Other Ambulatory Visit (HOSPITAL_COMMUNITY)
Admission: RE | Admit: 2011-05-11 | Discharge: 2011-05-11 | Disposition: A | Discharge status: Home / Self Care | Payer: BC Managed Care – PPO | Source: Ambulatory Visit | Attending: Family Medicine | Admitting: Family Medicine

## 2011-05-11 ENCOUNTER — Ambulatory Visit (INDEPENDENT_AMBULATORY_CARE_PROVIDER_SITE_OTHER): Payer: BC Managed Care – PPO | Admitting: Family Medicine

## 2011-05-11 VITALS — BP 116/72 | HR 72 | Temp 98.3°F | Ht 65.25 in | Wt 184.2 lb

## 2011-05-11 DIAGNOSIS — E785 Hyperlipidemia, unspecified: Secondary | ICD-10-CM

## 2011-05-11 DIAGNOSIS — Z23 Encounter for immunization: Secondary | ICD-10-CM

## 2011-05-11 DIAGNOSIS — Z111 Encounter for screening for respiratory tuberculosis: Secondary | ICD-10-CM

## 2011-05-11 DIAGNOSIS — K219 Gastro-esophageal reflux disease without esophagitis: Secondary | ICD-10-CM

## 2011-05-11 DIAGNOSIS — R609 Edema, unspecified: Secondary | ICD-10-CM

## 2011-05-11 DIAGNOSIS — D509 Iron deficiency anemia, unspecified: Secondary | ICD-10-CM

## 2011-05-11 DIAGNOSIS — Z01419 Encounter for gynecological examination (general) (routine) without abnormal findings: Secondary | ICD-10-CM | POA: Insufficient documentation

## 2011-05-11 DIAGNOSIS — M549 Dorsalgia, unspecified: Secondary | ICD-10-CM

## 2011-05-11 DIAGNOSIS — Z0000 Encounter for general adult medical examination without abnormal findings: Secondary | ICD-10-CM

## 2011-05-11 DIAGNOSIS — I10 Essential (primary) hypertension: Secondary | ICD-10-CM

## 2011-05-11 DIAGNOSIS — R87610 Atypical squamous cells of undetermined significance on cytologic smear of cervix (ASC-US): Secondary | ICD-10-CM

## 2011-05-11 LAB — COMPREHENSIVE METABOLIC PANEL
ALT: 12 U/L (ref 0–35)
AST: 13 U/L (ref 0–37)
Albumin: 4 g/dL (ref 3.5–5.2)
Alkaline Phosphatase: 63 U/L (ref 39–117)
BUN: 13 mg/dL (ref 6–23)
CO2: 24 mEq/L (ref 19–32)
Calcium: 9.3 mg/dL (ref 8.4–10.5)
Chloride: 108 mEq/L (ref 96–112)
Creatinine, Ser: 0.9 mg/dL (ref 0.4–1.2)
GFR: 84.62 mL/min (ref >60.00)
Glucose, Bld: 85 mg/dL (ref 70–99)
Potassium: 4.1 mEq/L (ref 3.5–5.1)
Sodium: 139 mEq/L (ref 135–145)
Total Bilirubin: 0.8 mg/dL (ref 0.3–1.2)
Total Protein: 6.8 g/dL (ref 6.0–8.3)

## 2011-05-11 LAB — CBC WITH DIFFERENTIAL/PLATELET
Basophils Absolute: 0 10*3/uL (ref 0.0–0.1)
Basophils Relative: 0.3 % (ref 0.0–3.0)
Eosinophils Absolute: 0.1 10*3/uL (ref 0.0–0.7)
Eosinophils Relative: 1 % (ref 0.0–5.0)
HCT: 37.8 % (ref 36.0–46.0)
Hemoglobin: 12.6 g/dL (ref 12.0–15.0)
Lymphocytes Relative: 26 % (ref 12.0–46.0)
Lymphs Abs: 2.3 10*3/uL (ref 0.7–4.0)
MCHC: 33.4 g/dL (ref 30.0–36.0)
MCV: 93.9 fl (ref 78.0–100.0)
Monocytes Absolute: 0.5 10*3/uL (ref 0.1–1.0)
Monocytes Relative: 5.7 % (ref 3.0–12.0)
Neutro Abs: 6 10*3/uL (ref 1.4–7.7)
Neutrophils Relative %: 67 % (ref 43.0–77.0)
Platelets: 288 10*3/uL (ref 150.0–400.0)
RBC: 4.03 Mil/uL (ref 3.87–5.11)
RDW: 13.7 % (ref 11.5–14.6)
WBC: 9 10*3/uL (ref 4.5–10.5)

## 2011-05-11 LAB — TSH: TSH: 1.02 u[IU]/mL (ref 0.35–5.50)

## 2011-05-11 LAB — LIPID PANEL
Cholesterol: 232 mg/dL — ABNORMAL HIGH (ref 0–200)
HDL: 61.1 mg/dL (ref >39.00)
Total CHOL/HDL Ratio: 4
Triglycerides: 55 mg/dL (ref 0.0–149.0)
VLDL: 11 mg/dL (ref 0.0–40.0)

## 2011-05-11 LAB — LDL CHOLESTEROL, DIRECT: Direct LDL: 149.9 mg/dL

## 2011-05-11 MED ORDER — TORSEMIDE 20 MG PO TABS: 20.0000 mg | ORAL_TABLET | Freq: Every day | ORAL | Status: DC

## 2011-05-11 MED ORDER — POTASSIUM CHLORIDE CRYS ER 20 MEQ PO TBCR: 20.0000 meq | EXTENDED_RELEASE_TABLET | Freq: Every day | ORAL | Status: DC

## 2011-05-11 MED ORDER — POLYSACCHARIDE IRON COMPLEX 150 MG PO CAPS: 150.0000 mg | ORAL_CAPSULE | Freq: Every day | ORAL | Status: DC

## 2011-05-11 MED ORDER — FLUTICASONE PROPIONATE 50 MCG/ACT NA SUSP: 2.0000 | Freq: Every day | NASAL | Status: DC

## 2011-05-11 MED ORDER — LISINOPRIL 20 MG PO TABS: 20.0000 mg | ORAL_TABLET | Freq: Every day | ORAL | Status: DC

## 2011-05-11 NOTE — Assessment & Plan Note (Signed)
todays pap is yearly -- last one 6 mo ago If neg - yearly No problems

## 2011-05-11 NOTE — Patient Instructions (Addendum)
Flu shot today Labs today  Pap today Call your insurance company to see if vit D level is covered for screening  Call also to ask what PPI (proton pump inhibitor) they cover for acid reflux- tell them you are intolerant of omeprazole (prilosec) and also zantac (ranitidine) does not work See Dr Patsy Lager if back/ leg continue to bother you Try nu iron for anemia  You may need a stool softener when taking iron in general over the counter

## 2011-05-11 NOTE — Assessment & Plan Note (Signed)
Worse lately  Intol omeprazole/ prilosec Ranitidine not eff Pt will check with ins and tell us what PPI is covered so I can px Disc diet/ habits  If not imp- consider GI consult

## 2011-05-11 NOTE — Assessment & Plan Note (Signed)
Labs today for cbc Change to nu iron since intol of ferrous sulfate
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
## 2011-05-11 NOTE — Assessment & Plan Note (Signed)
demadex works well for her  Lab today Avoiding salt

## 2011-05-11 NOTE — Assessment & Plan Note (Signed)
Good control  No change in med refils done Labs today Enc exercise

## 2011-05-11 NOTE — Assessment & Plan Note (Signed)
Reviewed health habits including diet and exercise and skin cancer prevention Also reviewed health mt list, fam hx and immunizations  Wellness lab today   
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
## 2011-05-11 NOTE — Assessment & Plan Note (Signed)
Annual exam done with pap today  No complaints Ascus in past

## 2011-05-11 NOTE — Assessment & Plan Note (Signed)
Lab today Diet not as good as it should be Rev low sat fat diet

## 2011-05-13 ENCOUNTER — Ambulatory Visit: Payer: BC Managed Care – PPO | Admitting: Family Medicine

## 2011-05-13 LAB — TB SKIN TEST: TB Skin Test: NEGATIVE mm

## 2011-05-14 ENCOUNTER — Other Ambulatory Visit: Payer: Self-pay | Admitting: Family Medicine

## 2011-05-14 DIAGNOSIS — E7800 Pure hypercholesterolemia, unspecified: Secondary | ICD-10-CM

## 2011-06-10 ENCOUNTER — Other Ambulatory Visit: Payer: Self-pay | Admitting: Family Medicine

## 2011-06-10 NOTE — Telephone Encounter (Signed)
Ask pt if she is taking this? -thanks

## 2011-06-10 NOTE — Telephone Encounter (Signed)
Ok to refill? Not on current list. Per last note it wasn't effective.

## 2011-06-11 NOTE — Telephone Encounter (Signed)
Pt said she is taking Ranitidine now. Pt is also checking on pap smear report. Pt can be reached at [phone].

## 2011-06-11 NOTE — Telephone Encounter (Signed)
Patient notified as instructed by telephone.Medication phoned to [facility] pharmacy as instructed.

## 2011-06-11 NOTE — Telephone Encounter (Signed)
Let her know pap is normal  Px written for call in

## 2011-06-18 ENCOUNTER — Telehealth: Payer: Self-pay

## 2011-06-18 DIAGNOSIS — L609 Nail disorder, unspecified: Secondary | ICD-10-CM | POA: Insufficient documentation

## 2011-06-18 NOTE — Telephone Encounter (Signed)
I want to go ahead and ref to podiatry  Will do referral

## 2011-06-18 NOTE — Telephone Encounter (Signed)
For 3 weeks left 2nd toenail is white and appears to be loose. Pt said toenail looks like it is lifting up and is coming loose from toe. Pt said no pain, or redness and no drainage or itching. Pt said she thinks water got under toenail and now has fungus. Pt wants to know if Dr Milinda Antis can call something to pharmacy or if she needs to be seen by Dr Milinda Antis or podiatrist.Please advise. Pt uses [facility] and can be reached on cell #.

## 2011-06-18 NOTE — Telephone Encounter (Signed)
Left v/m on home and cell pt to calal back.

## 2011-06-19 NOTE — Telephone Encounter (Signed)
Patient notified as instructed by telephone. Pt will wait to hear from pt care coordinator. 

## 2011-06-19 NOTE — Telephone Encounter (Signed)
Left v/m at home,cell and work # for pt to call back.

## 2011-08-07 ENCOUNTER — Other Ambulatory Visit (INDEPENDENT_AMBULATORY_CARE_PROVIDER_SITE_OTHER): Payer: BC Managed Care – PPO

## 2011-08-07 DIAGNOSIS — E7800 Pure hypercholesterolemia, unspecified: Secondary | ICD-10-CM

## 2011-08-07 NOTE — Progress Notes (Signed)
Addended by: Baldomero Lamy on: 08/07/2011 10:16 AM   Modules accepted: Orders

## 2011-08-08 LAB — ALT: ALT: 8 IU/L (ref 0–32)

## 2011-08-08 LAB — LIPID PANEL
Chol/HDL Ratio: 3.5 ratio units (ref 0.0–4.4)
Cholesterol, Total: 236 mg/dL — ABNORMAL HIGH (ref 100–199)
HDL: 67 mg/dL (ref >39)
LDL Calculated: 158 mg/dL — ABNORMAL HIGH (ref 0–99)
Triglycerides: 55 mg/dL (ref 0–149)
VLDL Cholesterol Cal: 11 mg/dL (ref 5–40)

## 2011-08-08 LAB — AST: AST: 15 IU/L (ref 0–40)

## 2011-08-14 ENCOUNTER — Ambulatory Visit: Payer: BC Managed Care – PPO | Admitting: Family Medicine

## 2011-08-21 ENCOUNTER — Ambulatory Visit: Payer: BC Managed Care – PPO | Admitting: Family Medicine

## 2011-09-02 ENCOUNTER — Encounter: Payer: Self-pay | Admitting: Family Medicine

## 2011-09-02 ENCOUNTER — Ambulatory Visit (INDEPENDENT_AMBULATORY_CARE_PROVIDER_SITE_OTHER): Payer: BC Managed Care – PPO | Admitting: Family Medicine

## 2011-09-02 VITALS — BP 124/84 | HR 80 | Temp 97.9°F | Ht 65.25 in | Wt 186.8 lb

## 2011-09-02 DIAGNOSIS — E785 Hyperlipidemia, unspecified: Secondary | ICD-10-CM

## 2011-09-02 DIAGNOSIS — D509 Iron deficiency anemia, unspecified: Secondary | ICD-10-CM

## 2011-09-02 DIAGNOSIS — K219 Gastro-esophageal reflux disease without esophagitis: Secondary | ICD-10-CM

## 2011-09-02 DIAGNOSIS — I10 Essential (primary) hypertension: Secondary | ICD-10-CM

## 2011-09-02 NOTE — Progress Notes (Signed)
Subjective:    Patient ID: Rachael Russell, female    DOB: Feb 16, 1966, 46 y.o.   MRN: [id number]  HPI Here for f/u of lipid and HTN and gerd Feels a bit sluggish   bp is 124/84    Today No cp or palpitations or headaches or edema  No side effects to medicines    Lipids higher Lab Results  Component Value Date   CHOL 232* 05/11/2011   CHOL 212* 01/03/2007   Lab Results  Component Value Date   HDL 67 08/07/2011   HDL 61.10 05/11/2011   HDL 40.9 01/03/2007   Lab Results  Component Value Date   LDLCALC 158* 08/07/2011   Lab Results  Component Value Date   TRIG 55 08/07/2011   TRIG 55.0 05/11/2011   TRIG 56 01/03/2007   Lab Results  Component Value Date   CHOLHDL 3.5 08/07/2011   CHOLHDL 4 05/11/2011   CHOLHDL 3.5 CALC 01/03/2007   Lab Results  Component Value Date   LDLDIRECT 149.9 05/11/2011   LDLDIRECT 123.2 01/03/2007   diet--has not given up beef , some donuts (eats those frequently) , no shellfish/ no cheese, some fast food(cut that back ) , some [place] foods with ground beef  Exercise- going to start   GERD- she called her insurance to see what is covered  is doing better with ranitidine - and no symptoms lately  Lab Results  Component Value Date   WBC 9.0 05/11/2011   HGB 12.6 05/11/2011   HCT 37.8 05/11/2011   MCV 93.9 05/11/2011   PLT 288.0 05/11/2011   stopped iron  Is really tired  Hx of iron def - ? Abs issue but oral iron has halped  Wants to check and see if HB is down again   Patient Active Problem List  Diagnoses  . HYPERLIPIDEMIA  . METABOLIC SYNDROME X  . ANEMIA, IRON DEFICIENCY NOS  . HYPERTENSION  . SINUSITIS, CHRONIC FRONTAL  . GERD  . LEUKORRHEA  . PLANTAR FASCIITIS, LEFT  . EDEMA  . ASCUS PAP  . INJURY TO UNSPECIFIED OPTIC NERVE AND PATHWAYS  . Palpitations  . Stress reaction, emotional  . Dizziness  . Allergic rhinitis  . Fibrocystic breast disease  . Neck pain, acute  . Back pain  . Routine general medical examination at a health  care facility  . Gynecological examination  . Loose toenail   Past Medical History  Diagnosis Date  . Hyperlipidemia   . Hypertension   . Breast mass, right 03/14/08    mammogram and ultrasound   Past Surgical History  Procedure Date  . Cesarean section   . Hernia repair   . Tsa    History  Substance Use Topics  . Smoking status: Never Smoker   . Smokeless tobacco: Not on file  . Alcohol Use: Not on file   Family History  Problem Relation Age of Onset  . Hypertension Mother   . Arthritis Mother     osteoarthritis  . Breast cancer Mother   . Hypertension Father   . Arthritis Father     osteoarthritis  . Cancer Maternal Grandmother     breast   Allergies  Allergen Reactions  . Ferrous Sulfate     constipation  . Lovastatin     REACTION: chest pain  . Penicillins     REACTION: ? if allergic as child  . Prilosec (Omeprazole Magnesium)     Dizziness   . Zantac     Not  effective   Current Outpatient Prescriptions on File Prior to Visit  Medication Sig Dispense Refill  . lisinopril (PRINIVIL,ZESTRIL) 20 MG tablet Take 1 tablet (20 mg total) by mouth daily.  90 tablet  3  . Multiple Vitamin (MULTIVITAMIN) tablet Take 2 tablets by mouth daily.       . potassium chloride SA (KLOR-CON M20) 20 MEQ tablet Take 1 tablet (20 mEq total) by mouth daily.  90 tablet  3  . ranitidine (ZANTAC) 150 MG tablet TAKE ONE TABLET BY MOUTH TWICE DAILY  60 tablet  11  . torsemide (DEMADEX) 20 MG tablet Take 1 tablet (20 mg total) by mouth daily.  90 tablet  3  . cyclobenzaprine (FLEXERIL) 10 MG tablet Take one half to one by mouth up to 3 times a day as needed for neck pain.       Marland Kitchen diclofenac (VOLTAREN) 75 MG EC tablet Take 1 tablet (75 mg total) by mouth 2 (two) times daily.  60 tablet  3  . diphenhydrAMINE (BENADRYL) 25 mg capsule OTC as directed.       . fluticasone (FLONASE) 50 MCG/ACT nasal spray Place 2 sprays into the nose daily.  48 g  3  . iron polysaccharides (NU-IRON) 150 MG  capsule Take 1 capsule (150 mg total) by mouth daily.  30 capsule  11         Review of Systems Review of Systems  Constitutional: Negative for fever, appetite change,  and unexpected weight change. pos for fatigue  Eyes: Negative for pain and visual disturbance.  Respiratory: Negative for cough and shortness of breath.   Cardiovascular: Negative for cp or palpitations    Gastrointestinal: Negative for nausea, diarrhea and constipation.  Genitourinary: Negative for urgency and frequency.  Skin: Negative for pallor or rash   Neurological: Negative for weakness, light-headedness, numbness and headaches.  Hematological: Negative for adenopathy. Does not bruise/bleed easily.  Psychiatric/Behavioral: Negative for dysphoric mood. The patient is not nervous/anxious.          Objective:   Physical Exam  Constitutional: She appears well-developed and well-nourished. No distress.  HENT:  Head: Normocephalic and atraumatic.  Mouth/Throat: Oropharynx is clear and moist.  Eyes: Conjunctivae and EOM are normal. Pupils are equal, round, and reactive to light. No scleral icterus.       No conj pallor  Neck: Normal range of motion. Neck supple. No JVD present. Carotid bruit is not present. No thyromegaly present.  Cardiovascular: Normal rate, regular rhythm and normal heart sounds.  Exam reveals no gallop.   Pulmonary/Chest: Effort normal and breath sounds normal. No respiratory distress. She has no wheezes.  Abdominal: Soft. Bowel sounds are normal. She exhibits no distension and no mass. There is no tenderness.  Musculoskeletal: Normal range of motion. She exhibits no edema and no tenderness.  Lymphadenopathy:    She has no cervical adenopathy.  Neurological: She is alert. She has normal reflexes. She displays no tremor. No cranial nerve deficit. She exhibits normal muscle tone. Coordination normal.  Skin: Skin is warm and dry. No rash noted. No erythema. No pallor.  Psychiatric: She has a  normal mood and affect.       Seems fatigued but in good spirits           Assessment & Plan:

## 2011-09-02 NOTE — Assessment & Plan Note (Addendum)
Is now off iron and more sluggish Finger stick hct - machine not working- will do draw for H and H and update

## 2011-09-02 NOTE — Assessment & Plan Note (Signed)
Too high - poss from fast food and donuts Disc goals for lipids and reasons to control them Rev labs with pt Rev low sat fat diet in detail Re check 6 mo and f/u

## 2011-09-02 NOTE — Patient Instructions (Addendum)
Hematocrit today for anemia  Avoid red meat/ fried foods/ egg yolks/ fatty breakfast meats/ butter, cheese and high fat dairy/ and shellfish   Schedule fasting lab in 6 months and follow up  Continue zantac  See a dermatologist to check out toenail

## 2011-09-02 NOTE — Assessment & Plan Note (Signed)
Now doing fine on ranitidine alone  Disc diet  Will continue to monitor

## 2011-09-02 NOTE — Assessment & Plan Note (Signed)
bp in fair control at this time  No changes needed  Disc lifstyle change with low sodium diet and exercise   

## 2011-09-03 LAB — HEMATOCRIT: HCT: 36 % (ref 34.0–46.6)

## 2011-09-03 LAB — HEMOGLOBIN: Hemoglobin: 12.4 g/dL (ref 11.1–15.9)

## 2011-09-25 ENCOUNTER — Telehealth: Payer: Self-pay | Admitting: Family Medicine

## 2011-09-25 NOTE — Telephone Encounter (Signed)
From the story (cannot tell for sure without examining her ) -- that sounds like more of an issue of venous return that arterial circulation (blood pooling in legs ) I doubt it is cholesterol related  Follow up and we will check things out  Back pain can certainly also radiate pain to the legs  Either way- support hose may help also

## 2011-09-25 NOTE — Telephone Encounter (Signed)
Triage Record Num: [id number] Operator: Chevis Pretty Patient Name: Rachael Russell Call Date & Time: 09/25/2011 2:43:34PM Patient Phone: [phone] PCP: Patient Gender: Female PCP Fax : Patient DOB: [birth year] Practice Name: [facility] Reason for Call: Caller: Elika/Patient; PCP: Roxy Manns A.; CB#: [phone]; ; ; Call regarding Leg Pain, Poor Circulation?; States in December 2012 her cholesterol was high, and was given deietary changes and has appt for follow up in June 2013. States having some leg pain and swelling at the end of the workday. States by the end of the day, her legs are tired, painful, and puffy. States lower back hurts as well. Swelling recovers by morning. Some pain relief is noted when she rests for a bit after work with her legs up straight above her. States she is concerned that her cholesterol issue is causing circulatory problems. Per protocol, emergent symptoms denied; advised appt within 2 weeks. Patient declines appt; requests call back from Dr. Milinda Antis at her convenience. Info to office for provider reivew/callback. May reach patient at [phone] work, or cell [phone]. Protocol(s) Used: Edema, Atraumatic Recommended Outcome per Protocol: See Provider within 2 Weeks Reason for Outcome: Develops swelling of lower extremities during day, worsens with standing and resolves with elevation of legs Care Advice: ~ Call provider if symptoms worsen or new symptoms develop. Change position frequently. Flex and extend ankles for 10-12 repetitions every hour if sitting. Do not cross legs when sitting. ~ ~ SYMPTOM / CONDITION MANAGEMENT ~ CAUTIONS LEG CARE: - Avoid prolonged sitting or standing; take a break to move around every hour or so. - Keep legs raised when sitting, resting or sleeping; when possible raise legs above level of the heart for 20 -30 minutes. - Do not cross your legs. - Wear loose, non-restrictive clothing, especially around  waist, groin area and legs. - Consider using support hose if recommended by your provider. ~ 09/25/2011 2:57:32PM Page 1 of 1 [email or web address]

## 2011-09-28 NOTE — Telephone Encounter (Signed)
Left detailed message on patient's cell VM.

## 2011-11-12 ENCOUNTER — Telehealth: Payer: Self-pay

## 2011-11-12 DIAGNOSIS — Z0279 Encounter for issue of other medical certificate: Secondary | ICD-10-CM

## 2011-11-12 NOTE — Telephone Encounter (Signed)
noted 

## 2011-11-12 NOTE — Telephone Encounter (Signed)
Pt brought by accidental injury claim form and requested once filled out to fax to [phone]. Form is in your in box.

## 2012-02-10 ENCOUNTER — Other Ambulatory Visit: Payer: Self-pay | Admitting: *Deleted

## 2012-02-10 MED ORDER — TORSEMIDE 20 MG PO TABS: 20.0000 mg | ORAL_TABLET | Freq: Every day | ORAL | Status: DC

## 2012-02-10 MED ORDER — POTASSIUM CHLORIDE CRYS ER 20 MEQ PO TBCR: 20.0000 meq | EXTENDED_RELEASE_TABLET | Freq: Every day | ORAL | Status: DC

## 2012-02-10 MED ORDER — RANITIDINE HCL 150 MG PO TABS: 150.0000 mg | ORAL_TABLET | Freq: Every day | ORAL | Status: DC

## 2012-02-10 MED ORDER — LISINOPRIL 20 MG PO TABS: 20.0000 mg | ORAL_TABLET | Freq: Every day | ORAL | Status: DC

## 2012-03-07 ENCOUNTER — Other Ambulatory Visit: Payer: BC Managed Care – PPO

## 2012-03-14 ENCOUNTER — Ambulatory Visit: Payer: BC Managed Care – PPO | Admitting: Family Medicine

## 2012-04-05 ENCOUNTER — Telehealth: Payer: Self-pay | Admitting: Family Medicine

## 2012-04-05 NOTE — Telephone Encounter (Signed)
Patient needs to have her cpx done before 05/10/12 for work.  Your next available for a cpx is 09/06/12.  Can patient come in sooner for a cpx?

## 2012-04-05 NOTE — Telephone Encounter (Signed)
Yes- please put her in for any 30 min-thanks

## 2012-04-12 ENCOUNTER — Telehealth: Payer: Self-pay | Admitting: Family Medicine

## 2012-04-12 DIAGNOSIS — Z0000 Encounter for general adult medical examination without abnormal findings: Secondary | ICD-10-CM

## 2012-04-12 DIAGNOSIS — I10 Essential (primary) hypertension: Secondary | ICD-10-CM

## 2012-04-12 NOTE — Telephone Encounter (Signed)
Message copied by Judy Pimple on Tue Apr 12, 2012  8:55 PM ------      Message from: Alvina Chou      Created: Thu Apr 07, 2012  4:47 PM      Regarding: lab orders for Wed, 8.21.13       Patient is scheduled for CPX labs, please order future labs, Thanks , Camelia Eng            Some tests are ordered since Jan, not sure if you wanted more

## 2012-04-12 NOTE — Telephone Encounter (Signed)
The CBC and lipids are already ordered I added cmet and tsh

## 2012-04-13 ENCOUNTER — Other Ambulatory Visit (INDEPENDENT_AMBULATORY_CARE_PROVIDER_SITE_OTHER): Payer: Commercial Managed Care - PPO

## 2012-04-13 DIAGNOSIS — Z0000 Encounter for general adult medical examination without abnormal findings: Secondary | ICD-10-CM

## 2012-04-13 DIAGNOSIS — I10 Essential (primary) hypertension: Secondary | ICD-10-CM

## 2012-04-13 DIAGNOSIS — E785 Hyperlipidemia, unspecified: Secondary | ICD-10-CM

## 2012-04-13 DIAGNOSIS — D509 Iron deficiency anemia, unspecified: Secondary | ICD-10-CM

## 2012-04-13 NOTE — Addendum Note (Signed)
Addended by: Baldomero Lamy on: 04/13/2012 09:56 AM   Modules accepted: Orders

## 2012-04-13 NOTE — Telephone Encounter (Signed)
I this this is for you.

## 2012-04-14 LAB — CBC WITH DIFFERENTIAL/PLATELET
Basophils Absolute: 0 10*3/uL (ref 0.0–0.2)
Basos: 0 % (ref 0–3)
Eos: 1 % (ref 0–7)
Eosinophils Absolute: 0.1 10*3/uL (ref 0.0–0.4)
HCT: 34.4 % (ref 34.0–46.6)
Hemoglobin: 11.7 g/dL (ref 11.1–15.9)
Immature Grans (Abs): 0 10*3/uL (ref 0.0–0.1)
Immature Granulocytes: 0 % (ref 0–2)
Lymphocytes Absolute: 1.5 10*3/uL (ref 0.7–4.5)
Lymphs: 26 % (ref 14–46)
MCH: 30.7 pg (ref 26.6–33.0)
MCHC: 34 g/dL (ref 31.5–35.7)
MCV: 90 fL (ref 79–97)
Monocytes Absolute: 0.4 10*3/uL (ref 0.1–1.0)
Monocytes: 8 % (ref 4–13)
Neutrophils Absolute: 3.8 10*3/uL (ref 1.8–7.8)
Neutrophils Relative %: 65 % (ref 40–74)
RBC: 3.81 x10E6/uL (ref 3.77–5.28)
RDW: 13.9 % (ref 12.3–15.4)
WBC: 5.9 10*3/uL (ref 4.0–10.5)

## 2012-04-14 LAB — COMPREHENSIVE METABOLIC PANEL
ALT: 12 IU/L (ref 0–32)
AST: 14 IU/L (ref 0–40)
Albumin/Globulin Ratio: 1.7 (ref 1.1–2.5)
Albumin: 4 g/dL (ref 3.5–5.5)
Alkaline Phosphatase: 58 IU/L (ref 42–107)
BUN/Creatinine Ratio: 14 (ref 9–23)
BUN: 15 mg/dL (ref 6–24)
CO2: 23 mmol/L (ref 19–28)
Calcium: 8.9 mg/dL (ref 8.7–10.2)
Chloride: 104 mmol/L (ref 97–108)
Creatinine, Ser: 1.04 mg/dL — ABNORMAL HIGH (ref 0.57–1.00)
GFR calc Af Amer: 74 mL/min/{1.73_m2} (ref >59)
GFR calc non Af Amer: 65 mL/min/{1.73_m2} (ref >59)
Globulin, Total: 2.3 g/dL (ref 1.5–4.5)
Glucose: 79 mg/dL (ref 65–99)
Potassium: 4.2 mmol/L (ref 3.5–5.2)
Sodium: 140 mmol/L (ref 134–144)
Total Bilirubin: 0.4 mg/dL (ref 0.0–1.2)
Total Protein: 6.3 g/dL (ref 6.0–8.5)

## 2012-04-14 LAB — LIPID PANEL WITH LDL/HDL RATIO
Cholesterol, Total: 218 mg/dL — ABNORMAL HIGH (ref 100–199)
HDL: 59 mg/dL (ref >39)
LDL Calculated: 148 mg/dL — ABNORMAL HIGH (ref 0–99)
LDl/HDL Ratio: 2.5 ratio units (ref 0.0–3.2)
Triglycerides: 54 mg/dL (ref 0–149)
VLDL Cholesterol Cal: 11 mg/dL (ref 5–40)

## 2012-04-14 LAB — VITAMIN D 25 HYDROXY (VIT D DEFICIENCY, FRACTURES): Vit D, 25-Hydroxy: 22.9 ng/mL — ABNORMAL LOW (ref 30.0–100.0)

## 2012-04-14 LAB — TSH: TSH: 1.82 u[IU]/mL (ref 0.450–4.500)

## 2012-04-20 ENCOUNTER — Other Ambulatory Visit (HOSPITAL_COMMUNITY)
Admission: RE | Admit: 2012-04-20 | Discharge: 2012-04-20 | Disposition: A | Discharge status: Home / Self Care | Payer: Commercial Managed Care - PPO | Source: Ambulatory Visit | Attending: Family Medicine | Admitting: Family Medicine

## 2012-04-20 ENCOUNTER — Encounter: Payer: Self-pay | Admitting: Family Medicine

## 2012-04-20 ENCOUNTER — Ambulatory Visit (INDEPENDENT_AMBULATORY_CARE_PROVIDER_SITE_OTHER): Payer: Commercial Managed Care - PPO | Admitting: Family Medicine

## 2012-04-20 VITALS — BP 120/64 | HR 75 | Temp 98.4°F | Ht 65.0 in | Wt 174.8 lb

## 2012-04-20 DIAGNOSIS — I10 Essential (primary) hypertension: Secondary | ICD-10-CM

## 2012-04-20 DIAGNOSIS — Z01419 Encounter for gynecological examination (general) (routine) without abnormal findings: Secondary | ICD-10-CM | POA: Insufficient documentation

## 2012-04-20 DIAGNOSIS — Z0000 Encounter for general adult medical examination without abnormal findings: Secondary | ICD-10-CM

## 2012-04-20 DIAGNOSIS — E785 Hyperlipidemia, unspecified: Secondary | ICD-10-CM

## 2012-04-20 NOTE — Assessment & Plan Note (Signed)
Reviewed health habits including diet and exercise and skin cancer prevention Also reviewed health mt list, fam hx and immunizations   Rev wellness labs  PPD today for work Will fax form for me to fill out

## 2012-04-20 NOTE — Assessment & Plan Note (Signed)
Annual exam with pap done  Last pap nl

## 2012-04-20 NOTE — Patient Instructions (Addendum)
Don't forget your mammogram in sept  Start some vitamin D3 over the counter 1000 iu once daily  Change your multivitamin to one with iron  Cholesterol is improved- but not quite at goal  Avoid red meat/ fried foods/ egg yolks/ fatty breakfast meats/ butter, cheese and high fat dairy/ and shellfish   Keep up the great work with diet and exercise Come back on Friday to have PPD read

## 2012-04-20 NOTE — Assessment & Plan Note (Signed)
bp is stable today  No cp or palpitations or headaches or edema  No side effects to medicines  Rev labs

## 2012-04-20 NOTE — Assessment & Plan Note (Signed)
Pt intol of statins and declines them but watches diet Some imp this year with LDL in 140s Disc goals for lipids and reasons to control them Rev labs with pt Rev low sat fat diet in detail

## 2012-04-20 NOTE — Progress Notes (Signed)
Subjective:    Patient ID: Rachael Russell, female    DOB: 1966-06-04, 46 y.o.   MRN: [id number]  HPI Here for health maintenance exam and to review chronic medical problems    Needs a form filled out as well - for work - gets audited by the state if she does not get it    Wt is down 12 lb with bmi of 29 Is exercising and drinking more water  Is doing great - feels good ! Walks every am 2.5 miles or gets on the ellipitical  No sodas and no tea  Also portion control  Husband is doing the same also   mammo 9/12--has her appt set up for 9/17  Self exam-no lumps or changes  Has very dense breasts  Pap 9/12-normal - wants to do today- changed ins co  Ascus before that  Menses -LMP aug 18th, and regular / 4 days/ no problems - husb had a vasectomy  Vit D low at 22.9 - does not take any otc     Chemistry      Component Value Date/Time   NA 140 04/13/2012 1002   NA 139 05/11/2011 1230   K 4.2 04/13/2012 1002   CL 104 04/13/2012 1002   CO2 23 04/13/2012 1002   BUN 15 04/13/2012 1002   BUN 13 05/11/2011 1230   CREATININE 1.04* 04/13/2012 1002      Component Value Date/Time   CALCIUM 8.9 04/13/2012 1002   ALKPHOS 58 04/13/2012 1002   AST 14 04/13/2012 1002   ALT 12 04/13/2012 1002   BILITOT 0.4 04/13/2012 1002       Lab Results  Component Value Date   CHOL 232* 05/11/2011   CHOL 212* 01/03/2007   Lab Results  Component Value Date   HDL 59 04/13/2012   HDL 67 40/98/1191   HDL 61.10 05/11/2011   Lab Results  Component Value Date   LDLCALC 148* 04/13/2012   LDLCALC 158* 08/07/2011   Lab Results  Component Value Date   TRIG 54 04/13/2012   TRIG 55 08/07/2011   TRIG 55.0 05/11/2011   Lab Results  Component Value Date   CHOLHDL 3.5 08/07/2011   CHOLHDL 4 05/11/2011   CHOLHDL 3.5 CALC 01/03/2007   Lab Results  Component Value Date   LDLDIRECT 149.9 05/11/2011   LDLDIRECT 123.2 01/03/2007   cannot tolerate the statin meds Is working hard on diet /exercise   Other labs normal     bp is stable today  No cp or palpitations or headaches or edema  No side effects to medicines  BP Readings from Last 3 Encounters:  04/20/12 120/64  09/02/11 124/84  05/11/11 116/72     Lab Results  Component Value Date   WBC 5.9 04/13/2012   HGB 11.7 04/13/2012   HCT 34.4 04/13/2012   MCV 90 04/13/2012   PLT 288.0 05/11/2011   not taking any iron right now  She will go back to some iron   Patient Active Problem List  Diagnosis  . HYPERLIPIDEMIA  . METABOLIC SYNDROME X  . ANEMIA, IRON DEFICIENCY NOS  . HYPERTENSION  . SINUSITIS, CHRONIC FRONTAL  . GERD  . LEUKORRHEA  . PLANTAR FASCIITIS, LEFT  . EDEMA  . ASCUS PAP  . INJURY TO UNSPECIFIED OPTIC NERVE AND PATHWAYS  . Palpitations  . Stress reaction, emotional  . Dizziness  . Allergic rhinitis  . Fibrocystic breast disease  . Neck pain, acute  . Back pain  .  Routine general medical examination at a health care facility  . Routine gynecological examination  . Loose toenail   Past Medical History  Diagnosis Date  . Hyperlipidemia   . Hypertension   . Breast mass, right 03/14/08    mammogram and ultrasound   Past Surgical History  Procedure Date  . Cesarean section   . Hernia repair   . Tsa    History  Substance Use Topics  . Smoking status: Never Smoker   . Smokeless tobacco: Not on file  . Alcohol Use: No   Family History  Problem Relation Age of Onset  . Hypertension Mother   . Arthritis Mother     osteoarthritis  . Breast cancer Mother   . Hypertension Father   . Arthritis Father     osteoarthritis  . Cancer Maternal Grandmother     breast   Allergies  Allergen Reactions  . Ferrous Sulfate     constipation  . Lovastatin     REACTION: chest pain  . Penicillins     REACTION: ? if allergic as child  . Prilosec (Omeprazole Magnesium)     Dizziness   . Ranitidine Hcl     Not effective   Current Outpatient Prescriptions on File Prior to Visit  Medication Sig Dispense Refill  .  cyclobenzaprine (FLEXERIL) 10 MG tablet Take one half to one by mouth up to 3 times a day as needed for neck pain.       . diphenhydrAMINE (BENADRYL) 25 mg capsule OTC as directed.       . fluticasone (FLONASE) 50 MCG/ACT nasal spray Place 2 sprays into the nose daily.  48 g  3  . lisinopril (PRINIVIL,ZESTRIL) 20 MG tablet Take 1 tablet (20 mg total) by mouth daily.  90 tablet  2  . Multiple Vitamin (MULTIVITAMIN) tablet Take 2 tablets by mouth daily.       . potassium chloride SA (KLOR-CON M20) 20 MEQ tablet Take 1 tablet (20 mEq total) by mouth daily.  90 tablet  2  . ranitidine (ZANTAC) 150 MG tablet Take 1 tablet (150 mg total) by mouth daily.  90 tablet  2  . torsemide (DEMADEX) 20 MG tablet Take 1 tablet (20 mg total) by mouth daily.  90 tablet  2  . iron polysaccharides (NU-IRON) 150 MG capsule Take 1 capsule (150 mg total) by mouth daily.  30 capsule  11     Review of Systems Review of Systems  Constitutional: Negative for fever, appetite change, fatigue and unexpected weight change.  Eyes: Negative for pain and visual disturbance.  Respiratory: Negative for cough and shortness of breath.   Cardiovascular: Negative for cp or palpitations    Gastrointestinal: Negative for nausea, diarrhea and constipation.  Genitourinary: Negative for urgency and frequency.  Skin: Negative for pallor or rash   Neurological: Negative for weakness, light-headedness, numbness and headaches.  Hematological: Negative for adenopathy. Does not bruise/bleed easily.  Psychiatric/Behavioral: Negative for dysphoric mood. The patient is not nervous/anxious.         Objective:   Physical Exam  Constitutional: She appears well-developed and well-nourished. No distress.  HENT:  Head: Normocephalic and atraumatic.  Right Ear: External ear normal.  Left Ear: External ear normal.  Nose: Nose normal.  Mouth/Throat: Oropharynx is clear and moist.  Eyes: Conjunctivae and EOM are normal. Pupils are equal, round,  and reactive to light. No scleral icterus.  Neck: Normal range of motion. Neck supple. No JVD present. Carotid bruit is  not present. No thyromegaly present.  Cardiovascular: Normal rate, regular rhythm, normal heart sounds and intact distal pulses.  Exam reveals no gallop.   Pulmonary/Chest: Effort normal and breath sounds normal. No respiratory distress. She has no wheezes.  Abdominal: Soft. Bowel sounds are normal. She exhibits no distension, no abdominal bruit and no mass. There is no tenderness.  Genitourinary: Vagina normal and uterus normal. Guaiac negative stool. No breast swelling, tenderness, discharge or bleeding. There is no rash, tenderness or lesion on the right labia. There is no rash or lesion on the left labia. Uterus is not enlarged and not tender. Cervix exhibits no motion tenderness, no discharge and no friability. Right adnexum displays no mass, no tenderness and no fullness. Left adnexum displays no mass, no tenderness and no fullness. No vaginal discharge found.  Musculoskeletal: Normal range of motion. She exhibits no edema and no tenderness.  Lymphadenopathy:    She has no cervical adenopathy.  Neurological: She is alert. She has normal reflexes. No cranial nerve deficit. She exhibits normal muscle tone. Coordination normal.  Skin: Skin is warm and dry. No rash noted. No erythema. No pallor.  Psychiatric: She has a normal mood and affect.          Assessment & Plan:

## 2012-05-12 ENCOUNTER — Encounter: Payer: Self-pay | Admitting: Family Medicine

## 2012-05-12 ENCOUNTER — Encounter: Payer: Self-pay | Admitting: *Deleted

## 2012-09-22 ENCOUNTER — Other Ambulatory Visit: Payer: Self-pay | Admitting: *Deleted

## 2012-09-22 MED ORDER — TORSEMIDE 20 MG PO TABS: 20.0000 mg | ORAL_TABLET | Freq: Every day | ORAL | Status: DC

## 2012-09-22 MED ORDER — RANITIDINE HCL 150 MG PO TABS: 150.0000 mg | ORAL_TABLET | Freq: Every day | ORAL | Status: DC

## 2012-09-22 MED ORDER — LISINOPRIL 20 MG PO TABS: 20.0000 mg | ORAL_TABLET | Freq: Every day | ORAL | Status: DC

## 2012-10-21 ENCOUNTER — Ambulatory Visit (INDEPENDENT_AMBULATORY_CARE_PROVIDER_SITE_OTHER): Payer: 59 | Admitting: Family Medicine

## 2012-10-21 ENCOUNTER — Encounter: Payer: Self-pay | Admitting: Family Medicine

## 2012-10-21 VITALS — BP 144/92 | HR 77 | Temp 98.9°F | Ht 65.0 in | Wt 180.0 lb

## 2012-10-21 DIAGNOSIS — N6459 Other signs and symptoms in breast: Secondary | ICD-10-CM

## 2012-10-21 DIAGNOSIS — N6452 Nipple discharge: Secondary | ICD-10-CM | POA: Insufficient documentation

## 2012-10-21 NOTE — Progress Notes (Signed)
Subjective:    Patient ID: Rachael Russell, female    DOB: 1965-10-26, 47 y.o.   MRN: [id number]  HPI Here with some discharge from nipple  Did her self exam this am -no pain but did have some bloody discharge mammo was 9/13  In the past she has had clear fluid discharge   No lumps at all  She does have fibrocystic breasts  Goes to [facility]  Patient Active Problem List  Diagnosis  . HYPERLIPIDEMIA  . METABOLIC SYNDROME X  . ANEMIA, IRON DEFICIENCY NOS  . HYPERTENSION  . SINUSITIS, CHRONIC FRONTAL  . GERD  . LEUKORRHEA  . PLANTAR FASCIITIS, LEFT  . EDEMA  . ASCUS PAP  . INJURY TO UNSPECIFIED OPTIC NERVE AND PATHWAYS  . Palpitations  . Stress reaction, emotional  . Dizziness  . Allergic rhinitis  . Fibrocystic breast disease  . Neck pain, acute  . Back pain  . Routine general medical examination at a health care facility  . Routine gynecological examination  . Loose toenail  . Nipple discharge, bloody   Past Medical History  Diagnosis Date  . Hyperlipidemia   . Hypertension   . Breast mass, right 03/14/08    mammogram and ultrasound   Past Surgical History  Procedure Laterality Date  . Cesarean section    . Hernia repair    . Tsa     History  Substance Use Topics  . Smoking status: Never Smoker   . Smokeless tobacco: Not on file  . Alcohol Use: No   Family History  Problem Relation Age of Onset  . Hypertension Mother   . Arthritis Mother     osteoarthritis  . Breast cancer Mother   . Hypertension Father   . Arthritis Father     osteoarthritis  . Cancer Maternal Grandmother     breast   Allergies  Allergen Reactions  . Ferrous Sulfate     constipation  . Lovastatin     REACTION: chest pain  . Penicillins     REACTION: ? if allergic as child  . Prilosec (Omeprazole Magnesium)     Dizziness   . Ranitidine Hcl     Not effective   Current Outpatient Prescriptions on File Prior to Visit  Medication Sig Dispense Refill  . cyclobenzaprine  (FLEXERIL) 10 MG tablet Take one half to one by mouth up to 3 times a day as needed for neck pain.       Marland Kitchen lisinopril (PRINIVIL,ZESTRIL) 20 MG tablet Take 1 tablet (20 mg total) by mouth daily.  90 tablet  1  . Multiple Vitamin (MULTIVITAMIN) tablet Take 2 tablets by mouth daily.       . potassium chloride SA (KLOR-CON M20) 20 MEQ tablet Take 1 tablet (20 mEq total) by mouth daily.  90 tablet  2  . ranitidine (ZANTAC) 150 MG tablet Take 1 tablet (150 mg total) by mouth daily.  90 tablet  1  . torsemide (DEMADEX) 20 MG tablet Take 1 tablet (20 mg total) by mouth daily.  90 tablet  1  . diphenhydrAMINE (BENADRYL) 25 mg capsule OTC as directed.        No current facility-administered medications on file prior to visit.    Review of Systems Review of Systems  Constitutional: Negative for fever, appetite change, fatigue and unexpected weight change.  Eyes: Negative for pain and visual disturbance.  Respiratory: Negative for cough and shortness of breath.   Cardiovascular: Negative for cp or palpitations  Gastrointestinal: Negative for nausea, diarrhea and constipation.  Genitourinary: Negative for urgency and frequency. neg for breast pain  Skin: Negative for pallor or rash  neg for itching  Neurological: Negative for weakness, light-headedness, numbness and headaches.  Hematological: Negative for adenopathy. Does not bruise/bleed easily.  Psychiatric/Behavioral: Negative for dysphoric mood. The patient is not nervous/anxious.         Objective:   Physical Exam  Constitutional: She appears well-developed and well-nourished. No distress.  HENT:  Head: Normocephalic and atraumatic.  Mouth/Throat: Oropharynx is clear and moist.  Eyes: Conjunctivae and EOM are normal. Pupils are equal, round, and reactive to light. Right eye exhibits no discharge. Left eye exhibits no discharge.  Neck: Normal range of motion. Neck supple.  Cardiovascular: Normal rate and regular rhythm.   Pulmonary/Chest:  Effort normal and breath sounds normal. No respiratory distress.  Abdominal: Soft. Bowel sounds are normal.  Genitourinary: No breast swelling, tenderness, discharge or bleeding.  Breast exam: No mass, nodules, thickening, tenderness, bulging, retraction, inflamation,  or skin changes noted.  No axillary or clavicular LA. Nipple d/c tan to pink and clear noted on L side (this is guiac pos) Very dense breasts with no focal mass  Lymphadenopathy:    She has no cervical adenopathy.  Neurological: She is alert.  Skin: Skin is warm and dry. No rash noted.  Psychiatric: She has a normal mood and affect.          Assessment & Plan:

## 2012-10-21 NOTE — Patient Instructions (Addendum)
We will set you up for mammogram and ultrasound at [facility] - Shirlee Limerick will call you on Monday  If you develop redness or soreness of breast please let me know

## 2012-10-23 NOTE — Assessment & Plan Note (Signed)
Pathologic nipple discharge/ bloody on L  Pt has baseline clear d/c for years Last mam nl at [facility] Neg bx in past for fibroadenoma, and pos fam hx for breast ca Sent for mammo/ [place] of L breast  Upon result will refer to surgeon for further eval

## 2012-10-26 ENCOUNTER — Encounter: Payer: Self-pay | Admitting: Family Medicine

## 2012-11-03 ENCOUNTER — Encounter (INDEPENDENT_AMBULATORY_CARE_PROVIDER_SITE_OTHER): Payer: Self-pay | Admitting: General Surgery

## 2012-11-03 ENCOUNTER — Ambulatory Visit (INDEPENDENT_AMBULATORY_CARE_PROVIDER_SITE_OTHER): Payer: 59 | Admitting: General Surgery

## 2012-11-03 ENCOUNTER — Other Ambulatory Visit: Payer: Self-pay | Admitting: Radiology

## 2012-11-03 VITALS — BP 118/72 | HR 69 | Temp 97.1°F | Resp 18 | Ht 65.0 in | Wt 178.0 lb

## 2012-11-03 DIAGNOSIS — N6452 Nipple discharge: Secondary | ICD-10-CM

## 2012-11-03 DIAGNOSIS — N6459 Other signs and symptoms in breast: Secondary | ICD-10-CM

## 2012-11-03 NOTE — Progress Notes (Addendum)
Patient ID: Rachael Russell, female   DOB: 1966/03/27, 47 y.o.   MRN: [id number]  Chief Complaint  Patient presents with  . New Evaluation    bloody nipple discharge    HPI Rachael Russell is a 47 y.o. female.  She is referred by Dr. 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  The patient has been getting mammograms since she was about 47 years old because her mother and maternal grandmother had breast cancer. She had an image guided biopsy of the right breast and 2009 which was benign. On February 28 she began to notice a bloody left nipple discharge after compressing her breast. Mammograms of the breast showed that the breast is heterogeneously dense but no focal abnormality. Ultrasound showed ductal ectasia at the 12:00 retroareolar region but no defect. Left breast ductogram showed 2 findings, in the immediate retroareolar portion of the breast there were multiple filling defects the largest being about 6-7 mm. Secondly, there appeared to be several small filling defects further out of the ductal system. I discussed this by phone with Dr. Tilda Burrow today and he stated that the peripheral lesions were at the 2:00 position at least 5 cm from the nipple. He also strongly recommended breast MRI because of the patient's multiple risk factors. That will be arranged  The patient is otherwise fairly healthy. She is here with her husband today. Comorbidities include hypertension, hyperlipidemia, and GERD.  Family history reveals breast cancer in the mother with lumpectomy 2012, she was my patient. Maternal grandmother had bilateral total mastectomies but the patient is not sure whether she had bilateral cancer or not. There is no family history of ovarian cancer. If any history of colon cancer  HPI  Past Medical History  Diagnosis Date  . Hyperlipidemia   . Hypertension   . Breast mass, right 03/14/08     mammogram and ultrasound    Past Surgical History  Procedure Laterality Date  . Cesarean section    . Hernia repair    . Tsa      Family History  Problem Relation Age of Onset  . Hypertension Mother   . Arthritis Mother     osteoarthritis  . Breast cancer Mother   . Hypertension Father   . Arthritis Father     osteoarthritis  . Cancer Maternal Grandmother     breast    Social History History  Substance Use Topics  . Smoking status: Never Smoker   . Smokeless tobacco: Not on file  . Alcohol Use: No    Allergies  Allergen Reactions  . Ferrous Sulfate     constipation  . Lovastatin     REACTION: chest pain  . Penicillins     REACTION: ? if allergic as child  . Prilosec (Omeprazole Magnesium)     Dizziness   . Ranitidine Hcl     Not effective    Current Outpatient Prescriptions  Medication Sig Dispense Refill  . cyclobenzaprine (FLEXERIL) 10 MG tablet Take one half to one by mouth up to 3 times a day as needed for neck pain.       . diphenhydrAMINE (BENADRYL) 25 mg capsule OTC as directed.       Marland Kitchen lisinopril (PRINIVIL,ZESTRIL) 20 MG tablet Take 1 tablet (20 mg total) by mouth daily.  90 tablet  1  . Multiple Vitamin (MULTIVITAMIN) tablet Take 2 tablets by mouth daily.       Marland Kitchen  potassium chloride SA (KLOR-CON M20) 20 MEQ tablet Take 1 tablet (20 mEq total) by mouth daily.  90 tablet  2  . ranitidine (ZANTAC) 150 MG tablet Take 1 tablet (150 mg total) by mouth daily.  90 tablet  1  . torsemide (DEMADEX) 20 MG tablet Take 1 tablet (20 mg total) by mouth daily.  90 tablet  1  . iron polysaccharides (NIFEREX) 150 MG capsule Take 150 mg by mouth daily. Takes OTC       No current facility-administered medications for this visit.    Review of Systems Review of Systems  Constitutional: Negative for fever, chills and unexpected weight change.  HENT: Negative for hearing loss, congestion, sore throat, trouble swallowing and voice change.   Eyes: Negative for visual  disturbance.  Respiratory: Negative for cough and wheezing.   Cardiovascular: Negative for chest pain, palpitations and leg swelling.  Gastrointestinal: Negative for nausea, vomiting, abdominal pain, diarrhea, constipation, blood in stool, abdominal distention and anal bleeding.  Genitourinary: Negative for hematuria, vaginal bleeding and difficulty urinating.  Musculoskeletal: Negative for arthralgias.  Skin: Negative for rash and wound.  Neurological: Negative for seizures, syncope and headaches.  Hematological: Negative for adenopathy. Does not bruise/bleed easily.  Psychiatric/Behavioral: Negative for confusion.    Blood pressure 118/72, pulse 69, temperature 97.1 F (36.2 C), temperature source Temporal, resp. rate 18, height 5\' 5"  (1.651 m), weight 178 lb (80.74 kg), last menstrual period 10/04/2012.  Physical Exam Physical Exam  Constitutional: She is oriented to person, place, and time. She appears well-developed and well-nourished. No distress.  HENT:  Head: Normocephalic and atraumatic.  Nose: Nose normal.  Mouth/Throat: No oropharyngeal exudate.  Eyes: Conjunctivae and EOM are normal. Pupils are equal, round, and reactive to light. Left eye exhibits no discharge. No scleral icterus.  Neck: Neck supple. No JVD present. No tracheal deviation present. No thyromegaly present.  Cardiovascular: Normal rate, regular rhythm, normal heart sounds and intact distal pulses.   No murmur heard. Pulmonary/Chest: Effort normal and breath sounds normal. No respiratory distress. She has no wheezes. She has no rales. She exhibits no tenderness.  Breasts are moderately large. A little bit lumpy and dense but no dominant mass. I can elicit a serosanguineous drainage from the left nipple and this is in the very central portion of the nipple. No axillary adenopathy  on either side.  Abdominal: Soft. Bowel sounds are normal. She exhibits no distension and no mass. There is no tenderness. There is no  rebound and no guarding.  Musculoskeletal: She exhibits no edema and no tenderness.  Lymphadenopathy:    She has no cervical adenopathy.  Neurological: She is alert and oriented to person, place, and time. She exhibits normal muscle tone. Coordination normal.  Skin: Skin is warm. No rash noted. She is not diaphoretic. No erythema. No pallor.  Psychiatric: She has a normal mood and affect. Her behavior is normal. Judgment and thought content normal.    Data Reviewed I reviewed her mammograms. I reviewed her ductogram report by have requested those films as they were not sent. A phone conversation with Dr. Tilda Burrow. Dr. Latricia Heft we'll schedule the MRI.  Assessment    Bloody nipple discharge, left breast. Ductogram suggest multiple retro-areolar filling defects and papillomas, and also suggest peripheral filling defects at the 2:00 position. Both of these area should be excised  Increased risk due to breast density, multiple filling defects, and strong family history in 2 first-line relatives  Hypertension  Hyperlipidemia  Plan    Dr. Tilda Burrow will schedule breast MRI Addendum 11/17/2012: MRI shows numerous cysts, but no focal area of Suspicious enhancement. The papillomas were felt to be obscured by background parenchyma.  The patient will be scheduled for a dual procedure. I will perform excision of left breast ductal system with lacrimal duct probe guidance. On the same day, preop, she will go to [facility] and will undergo another ductogram and wire localization of the peripheral filling defects. I will excise that area as well, possibly through a second incision.  I discussed the indications, details, techniques and numerous risk of the surgery with the patient and her husband. They understand all these issues and all of their questions were answered. They agree with this plan.        Angelia Mould. Derrell Lolling, M.D., [facility] General and Minimally invasive  Surgery Breast and Colorectal Surgery Office:   [phone] Pager:   [phone]  11/03/2012, 9:47 AM

## 2012-11-03 NOTE — Patient Instructions (Addendum)
The bloody nipple discharge of your left breast is due to the multiple papillomas in the subareolar area. This area will need to be excised.  In addition, the radiologist thinks that there are peripheral filling defects in the duct. These should be excised as well but will require a radiographic wire localization.  Because of your strong family history, presence of multiple papillomas, and high density in your breast, Dr. Tilda Burrow  at [facility] will schedule you for preop breast MRI.  We will go ahead with scheduling of your left breast surgery as we described.      Lumpectomy, Breast Conserving Surgery A lumpectomy is breast surgery that removes only part of the breast. Another name used may be partial mastectomy. The amount removed varies. Make sure you understand how much of your breast will be removed. Reasons for a lumpectomy:  Any solid breast mass.  Grouped significant nodularity that may be confused with a solitary breast mass. Lumpectomy is the most common form of breast cancer surgery today. The surgeon removes the portion of your breast which contains the tumor (cancer). This is the lump. Some normal tissue around the lump is also removed to be sure that all the tumor has been removed.  If cancer cells are found in the margins where the breast tissue was removed, your surgeon will do more surgery to remove the remaining cancer tissue. This is called re-excision surgery. Radiation and/or chemotherapy treatments are often given following a lumpectomy to kill any cancer cells that could possibly remain.  REASONS YOU MAY NOT BE ABLE TO HAVE BREAST CONSERVING SURGERY:  The tumor is located in more than one place.  Your breast is small and the tumor is large so the breast would be disfigured.  The entire tumor removal is not successful with a lumpectomy.  You cannot commit to a full course of chemotherapy, radiation therapy or are pregnant and cannot have radiation.  You have  previously had radiation to the breast to treat cancer. HOW A LUMPECTOMY IS PERFORMED If overnight nursing is not required following a biopsy, a lumpectomy can be performed as a same-day surgery. This can be done in a hospital, clinic, or surgical center. The anesthesia used will depend on your surgeon. They will discuss this with you. A general anesthetic keeps you sleeping through the procedure. LET YOUR CAREGIVERS KNOW ABOUT THE FOLLOWING:  Allergies  Medications taken including herbs, eye drops, over the counter medications, and creams.  Use of steroids (by mouth or creams)  Previous problems with anesthetics or Novocaine.  Possibility of pregnancy, if this applies  History of blood clots (thrombophlebitis)  History of bleeding or blood problems.  Previous surgery  Other health problems BEFORE THE PROCEDURE You should be present one hour prior to your procedure unless directed otherwise.  AFTER THE PROCEDURE  After surgery, you will be taken to the recovery area where a nurse will watch and check your progress. Once you're awake, stable, and taking fluids well, barring other problems you will be allowed to go home.  Ice packs applied to your operative site may help with discomfort and keep the swelling down.  A small rubber drain may be placed in the breast for a couple of days to prevent a hematoma from developing in the breast.  A pressure dressing may be applied for 24 to 48 hours to prevent bleeding.  Keep the wound dry.  You may resume a normal diet and activities as directed. Avoid strenuous activities affecting the arm  on the side of the biopsy site such as tennis, swimming, heavy lifting (more than 10 pounds) or pulling.  Bruising in the breast is normal following this procedure.  Wearing a bra - even to bed - may be more comfortable and also help keep the dressing on.  Change dressings as directed.  Only take over-the-counter or prescription medicines for  pain, discomfort, or fever as directed by your caregiver. Call for your results as instructed by your surgeon. Remember it is your responsibility to get the results of your lumpectomy if your surgeon asked you to follow-up. Do not assume everything is fine if you have not heard from your caregiver. SEEK MEDICAL CARE IF:   There is increased bleeding (more than a small spot) from the wound.  You notice redness, swelling, or increasing pain in the wound.  Pus is coming from wound.  An unexplained oral temperature above 102 F (38.9 C) develops.  You notice a foul smell coming from the wound or dressing. SEEK IMMEDIATE MEDICAL CARE IF:   You develop a rash.  You have difficulty breathing.  You have any allergic problems. Document Released: 09/21/2006 Document Revised: 11/02/2011 Document Reviewed: 12/23/2006 [facility] Patient Information 2013 [place], [place].

## 2012-11-04 ENCOUNTER — Encounter: Payer: Self-pay | Admitting: Family Medicine

## 2012-11-07 ENCOUNTER — Telehealth (INDEPENDENT_AMBULATORY_CARE_PROVIDER_SITE_OTHER): Payer: Self-pay | Admitting: General Surgery

## 2012-11-07 NOTE — Telephone Encounter (Signed)
Pt called to ask for the approximate time she should expect to be out of work after her lumpectomy.  She has a mostly sedentary job.  Explained that everyone has their own level of pain tolerance, but generally to expect to return to work in one to two weeks.  She understands and will plan accordingly.

## 2012-11-10 ENCOUNTER — Encounter: Payer: Self-pay | Admitting: Family Medicine

## 2012-11-14 ENCOUNTER — Telehealth: Payer: Self-pay

## 2012-11-14 NOTE — Telephone Encounter (Signed)
Pt left v/m requesting call back so pt can update Dr Milinda Antis on mammogram and her next treatment. Left v/m for pt to call back.

## 2012-11-15 ENCOUNTER — Encounter (HOSPITAL_BASED_OUTPATIENT_CLINIC_OR_DEPARTMENT_OTHER): Payer: Self-pay | Admitting: *Deleted

## 2012-11-15 NOTE — Progress Notes (Signed)
To come in for labs and ekg 

## 2012-11-16 ENCOUNTER — Ambulatory Visit
Admission: RE | Admit: 2012-11-16 | Discharge: 2012-11-16 | Disposition: A | Discharge status: Home / Self Care | Payer: 59 | Source: Ambulatory Visit | Attending: Radiology | Admitting: Radiology

## 2012-11-16 ENCOUNTER — Other Ambulatory Visit: Payer: Self-pay

## 2012-11-16 ENCOUNTER — Other Ambulatory Visit (INDEPENDENT_AMBULATORY_CARE_PROVIDER_SITE_OTHER): Payer: Self-pay | Admitting: General Surgery

## 2012-11-16 ENCOUNTER — Encounter (HOSPITAL_BASED_OUTPATIENT_CLINIC_OR_DEPARTMENT_OTHER)
Admission: RE | Admit: 2012-11-16 | Discharge: 2012-11-16 | Disposition: A | Discharge status: Home / Self Care | Payer: 59 | Source: Ambulatory Visit | Attending: General Surgery | Admitting: General Surgery

## 2012-11-16 DIAGNOSIS — N6452 Nipple discharge: Secondary | ICD-10-CM

## 2012-11-16 MED ORDER — GADOBENATE DIMEGLUMINE 529 MG/ML IV SOLN
16.0000 mL | Freq: Once | INTRAVENOUS | Status: AC | PRN
  Administered 2012-11-16: 16 mL via INTRAVENOUS

## 2012-11-16 NOTE — H&P (Signed)
Rachael Russell    MRN:  [id number]   Description: 47 year old female  Provider: Ernestene Mention, MD  Department: [facility]       Diagnoses    Nipple discharge, bloody    -  Primary    611.79        Current Vitals    BP Pulse Temp(Src) Resp Ht Wt    118/72 69 97.1 F (36.2 C) (Temporal) 18 5\' 5"  (1.651 m) 178 lb (80.74 kg)    BMI - 29.62 kg/m2 10/04/2012               History and Physical   Ernestene Mention, MD     Status: Signed                          HPI Rachael Russell is a 47 y.o. female.  She is referred by Dr. 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   The patient has been getting mammograms since she was about 47 years old because her mother and maternal grandmother had breast cancer. She had an image guided biopsy of the right breast and 2009 which was benign. On February 28 she began to notice a bloody left nipple discharge after compressing her breast. Mammograms of the breast showed that the breast is heterogeneously dense but no focal abnormality. Ultrasound showed ductal ectasia at the 12:00 retroareolar region but no defect. Left breast ductogram showed 2 findings, in the immediate retroareolar portion of the breast there were multiple filling defects the largest being about 6-7 mm. Secondly, there appeared to be several small filling defects further out of the ductal system. I discussed this by phone with Dr. Tilda Burrow today and he stated that the peripheral lesions were at the 2:00 position at least 5 cm from the nipple. He also strongly recommended breast MRI because of the patient's multiple risk factors. That will be arranged   The patient is otherwise fairly healthy. She is here with her husband today. Comorbidities include hypertension, hyperlipidemia, and GERD.   Family history reveals breast cancer in the mother with lumpectomy 2012,  she was my patient. Maternal grandmother had bilateral total mastectomies but the patient is not sure whether she had bilateral cancer or not. There is no family history of ovarian cancer. If any history of colon cancer        Past Medical History   Diagnosis  Date   .  Hyperlipidemia     .  Hypertension     .  Breast mass, right  03/14/08       mammogram and ultrasound         Past Surgical History   Procedure  Laterality  Date   .  Cesarean section       .  Hernia repair       .  Tsa             Family History   Problem  Relation  Age of Onset   .  Hypertension  Mother     .  Arthritis  Mother         osteoarthritis   .  Breast cancer  Mother     .  Hypertension  Father     .  Arthritis  Father         osteoarthritis   .  Cancer  Maternal Grandmother         breast        Social History History   Substance Use Topics   .  Smoking status:  Never Smoker    .  Smokeless tobacco:  Not on file   .  Alcohol Use:  No         Allergies   Allergen  Reactions   .  Ferrous Sulfate         constipation   .  Lovastatin         REACTION: chest pain   .  Penicillins         REACTION: ? if allergic as child   .  Prilosec (Omeprazole Magnesium)         Dizziness    .  Ranitidine Hcl         Not effective         Current Outpatient Prescriptions   Medication  Sig  Dispense  Refill   .  cyclobenzaprine (FLEXERIL) 10 MG tablet  Take one half to one by mouth up to 3 times a day as needed for neck pain.          .  diphenhydrAMINE (BENADRYL) 25 mg capsule  OTC as directed.          Marland Kitchen  lisinopril (PRINIVIL,ZESTRIL) 20 MG tablet  Take 1 tablet (20 mg total) by mouth daily.   90 tablet   1   .  Multiple Vitamin (MULTIVITAMIN) tablet  Take 2 tablets by mouth daily.          .  potassium chloride SA (KLOR-CON M20) 20 MEQ tablet  Take 1 tablet (20 mEq total) by mouth daily.   90 tablet   2   .  ranitidine (ZANTAC) 150 MG tablet  Take 1 tablet (150 mg total) by mouth  daily.   90 tablet   1   .  torsemide (DEMADEX) 20 MG tablet  Take 1 tablet (20 mg total) by mouth daily.   90 tablet   1   .  iron polysaccharides (NIFEREX) 150 MG capsule  Take 150 mg by mouth daily. Takes OTC             No current facility-administered medications for this visit.        Review of Systems   Constitutional: Negative for fever, chills and unexpected weight change.  HENT: Negative for hearing loss, congestion, sore throat, trouble swallowing and voice change.   Eyes: Negative for visual disturbance.  Respiratory: Negative for cough and wheezing.   Cardiovascular: Negative for chest pain, palpitations and leg swelling.  Gastrointestinal: Negative for nausea, vomiting, abdominal pain, diarrhea, constipation, blood in stool, abdominal distention and anal bleeding.  Genitourinary: Negative for hematuria, vaginal bleeding and difficulty urinating.  Musculoskeletal: Negative for arthralgias.  Skin: Negative for rash and wound.  Neurological: Negative for seizures, syncope and headaches.  Hematological: Negative for adenopathy. Does not bruise/bleed easily.  Psychiatric/Behavioral: Negative for confusion.      Blood pressure 118/72, pulse 69, temperature 97.1 F (36.2 C), temperature source Temporal, resp. rate 18, height 5\' 5"  (1.651 m), weight 178 lb (80.74 kg), last menstrual period 10/04/2012.   Physical Exam  Constitutional: She is oriented to person, place, and time. She appears well-developed and well-nourished. No distress.  HENT:   Head: Normocephalic and atraumatic.   Nose: Nose normal.   Mouth/Throat: No oropharyngeal exudate.  Eyes: Conjunctivae and EOM are normal.  Pupils are equal, round, and reactive to light. Left eye exhibits no discharge. No scleral icterus.  Neck: Neck supple. No JVD present. No tracheal deviation present. No thyromegaly present.  Cardiovascular: Normal rate, regular rhythm, normal heart sounds and intact distal pulses.    No  murmur heard. Pulmonary/Chest: Effort normal and breath sounds normal. No respiratory distress. She has no wheezes. She has no rales. She exhibits no tenderness.  Breasts are moderately large. A little bit lumpy and dense but no dominant mass. I can elicit a serosanguineous drainage from the left nipple and this is in the very central portion of the nipple. No axillary adenopathy  on either side.  Abdominal: Soft. Bowel sounds are normal. She exhibits no distension and no mass. There is no tenderness. There is no rebound and no guarding.  Musculoskeletal: She exhibits no edema and no tenderness.  Lymphadenopathy:    She has no cervical adenopathy.  Neurological: She is alert and oriented to person, place, and time. She exhibits normal muscle tone. Coordination normal.  Skin: Skin is warm. No rash noted. She is not diaphoretic. No erythema. No pallor.  Psychiatric: She has a normal mood and affect. Her behavior is normal. Judgment and thought content normal.      Data Reviewed I reviewed her mammograms. I reviewed her ductogram report by have requested those films as they were not sent. A phone conversation with Dr. Tilda Burrow. Dr. Latricia Heft we'll schedule the MRI.   Assessment    Bloody nipple discharge, left breast. Ductogram suggest multiple retro-areolar filling defects and papillomas, and also suggest peripheral filling defects at the 2:00 position. Both of these area should be excised   Increased risk due to breast density, multiple filling defects, and strong family history in 2 first-line relatives   Hypertension   Hyperlipidemia      Plan    Dr. Tilda Burrow will schedule breast MRI   The patient will be scheduled for a dual procedure. I will perform excision of left breast ductal system with lacrimal duct probe guidance. On the same day, preop, she will go to [facility] and will undergo another ductogram and wire localization of the peripheral filling defects. I will excise that  area as well, possibly through a second incision.   I discussed the indications, details, techniques and numerous risk of the surgery with the patient and her husband. They understand all these issues and all of their questions were answered. They agree with this plan.           Angelia Mould. Derrell Lolling, M.D., [facility] General and Minimally invasive Surgery Breast and Colorectal Surgery Office:   [phone] Pager:   [phone]

## 2012-11-16 NOTE — Telephone Encounter (Signed)
Left vm requesting call back. 

## 2012-11-18 ENCOUNTER — Encounter (HOSPITAL_BASED_OUTPATIENT_CLINIC_OR_DEPARTMENT_OTHER)
Admission: RE | Disposition: A | Discharge status: Home / Self Care | Payer: Self-pay | Source: Ambulatory Visit | Attending: General Surgery

## 2012-11-18 ENCOUNTER — Ambulatory Visit (HOSPITAL_BASED_OUTPATIENT_CLINIC_OR_DEPARTMENT_OTHER)
Admission: RE | Admit: 2012-11-18 | Discharge: 2012-11-18 | Disposition: A | Discharge status: Home / Self Care | Payer: 59 | Source: Ambulatory Visit | Attending: General Surgery | Admitting: General Surgery

## 2012-11-18 ENCOUNTER — Encounter (HOSPITAL_BASED_OUTPATIENT_CLINIC_OR_DEPARTMENT_OTHER): Payer: Self-pay | Admitting: *Deleted

## 2012-11-18 ENCOUNTER — Encounter (HOSPITAL_BASED_OUTPATIENT_CLINIC_OR_DEPARTMENT_OTHER): Payer: Self-pay | Admitting: Anesthesiology

## 2012-11-18 ENCOUNTER — Ambulatory Visit (HOSPITAL_BASED_OUTPATIENT_CLINIC_OR_DEPARTMENT_OTHER): Payer: 59 | Admitting: Anesthesiology

## 2012-11-18 DIAGNOSIS — D249 Benign neoplasm of unspecified breast: Secondary | ICD-10-CM | POA: Insufficient documentation

## 2012-11-18 DIAGNOSIS — Z79899 Other long term (current) drug therapy: Secondary | ICD-10-CM | POA: Insufficient documentation

## 2012-11-18 DIAGNOSIS — N6452 Nipple discharge: Secondary | ICD-10-CM | POA: Diagnosis present

## 2012-11-18 DIAGNOSIS — I10 Essential (primary) hypertension: Secondary | ICD-10-CM | POA: Insufficient documentation

## 2012-11-18 DIAGNOSIS — K219 Gastro-esophageal reflux disease without esophagitis: Secondary | ICD-10-CM | POA: Insufficient documentation

## 2012-11-18 DIAGNOSIS — E785 Hyperlipidemia, unspecified: Secondary | ICD-10-CM | POA: Insufficient documentation

## 2012-11-18 DIAGNOSIS — Z803 Family history of malignant neoplasm of breast: Secondary | ICD-10-CM | POA: Insufficient documentation

## 2012-11-18 DIAGNOSIS — Z880 Allergy status to penicillin: Secondary | ICD-10-CM | POA: Insufficient documentation

## 2012-11-18 HISTORY — PX: BREAST LUMPECTOMY WITH NEEDLE LOCALIZATION: SHX5759

## 2012-11-18 HISTORY — PX: BREAST DUCTAL SYSTEM EXCISION: SHX5242

## 2012-11-18 HISTORY — DX: Presence of spectacles and contact lenses: Z97.3

## 2012-11-18 HISTORY — DX: Gastro-esophageal reflux disease without esophagitis: K21.9

## 2012-11-18 SURGERY — BREAST LUMPECTOMY WITH NEEDLE LOCALIZATION
Anesthesia: General | Site: Breast | Laterality: Left | Wound class: Clean

## 2012-11-18 MED ORDER — BUPIVACAINE-EPINEPHRINE 0.5% -1:200000 IJ SOLN
INTRAMUSCULAR | Status: DC | PRN
  Administered 2012-11-18: 18 mL

## 2012-11-18 MED ORDER — ONDANSETRON HCL 4 MG/2ML IJ SOLN: 4.0000 mg | Freq: Four times a day (QID) | INTRAMUSCULAR | Status: DC | PRN

## 2012-11-18 MED ORDER — KETOROLAC TROMETHAMINE 30 MG/ML IJ SOLN
INTRAMUSCULAR | Status: DC | PRN
  Administered 2012-11-18: 30 mg via INTRAVENOUS

## 2012-11-18 MED ORDER — LACTATED RINGERS IV SOLN
INTRAVENOUS | Status: DC
  Administered 2012-11-18: 12:00:00 via INTRAVENOUS

## 2012-11-18 MED ORDER — SODIUM CHLORIDE 0.9 % IJ SOLN: 3.0000 mL | INTRAMUSCULAR | Status: DC | PRN

## 2012-11-18 MED ORDER — MIDAZOLAM HCL 2 MG/2ML IJ SOLN: 1.0000 mg | INTRAMUSCULAR | Status: DC | PRN

## 2012-11-18 MED ORDER — ACETAMINOPHEN 650 MG RE SUPP: 650.0000 mg | RECTAL | Status: DC | PRN

## 2012-11-18 MED ORDER — ACETAMINOPHEN 10 MG/ML IV SOLN
1000.0000 mg | Freq: Once | INTRAVENOUS | Status: AC
  Administered 2012-11-18: 1000 mg via INTRAVENOUS

## 2012-11-18 MED ORDER — ONDANSETRON HCL 4 MG/2ML IJ SOLN: 4.0000 mg | Freq: Once | INTRAMUSCULAR | Status: DC | PRN

## 2012-11-18 MED ORDER — MIDAZOLAM HCL 5 MG/5ML IJ SOLN
INTRAMUSCULAR | Status: DC | PRN
  Administered 2012-11-18: 2 mg via INTRAVENOUS

## 2012-11-18 MED ORDER — SODIUM CHLORIDE 0.9 % IV SOLN: 250.0000 mL | INTRAVENOUS | Status: DC | PRN

## 2012-11-18 MED ORDER — PROPOFOL 10 MG/ML IV BOLUS
INTRAVENOUS | Status: DC | PRN
  Administered 2012-11-18: 30 mg via INTRAVENOUS
  Administered 2012-11-18: 200 mg via INTRAVENOUS

## 2012-11-18 MED ORDER — ONDANSETRON HCL 4 MG/2ML IJ SOLN
INTRAMUSCULAR | Status: DC | PRN
  Administered 2012-11-18: 4 mg via INTRAVENOUS

## 2012-11-18 MED ORDER — LIDOCAINE HCL (CARDIAC) 20 MG/ML IV SOLN
INTRAVENOUS | Status: DC | PRN
  Administered 2012-11-18: 80 mg via INTRAVENOUS

## 2012-11-18 MED ORDER — OXYCODONE-ACETAMINOPHEN 7.5-325 MG PO TABS: 1.0000 | ORAL_TABLET | ORAL | Status: DC | PRN

## 2012-11-18 MED ORDER — VANCOMYCIN HCL IN DEXTROSE 1-5 GM/200ML-% IV SOLN
1000.0000 mg | INTRAVENOUS | Status: AC
  Administered 2012-11-18: 1000 mg via INTRAVENOUS

## 2012-11-18 MED ORDER — CHLORHEXIDINE GLUCONATE 4 % EX LIQD: 1.0000 "application " | Freq: Once | CUTANEOUS | Status: DC

## 2012-11-18 MED ORDER — ACETAMINOPHEN 325 MG PO TABS: 650.0000 mg | ORAL_TABLET | ORAL | Status: DC | PRN

## 2012-11-18 MED ORDER — MORPHINE SULFATE 2 MG/ML IJ SOLN: 2.0000 mg | INTRAMUSCULAR | Status: DC | PRN

## 2012-11-18 MED ORDER — SODIUM CHLORIDE 0.9 % IJ SOLN: 3.0000 mL | Freq: Two times a day (BID) | INTRAMUSCULAR | Status: DC

## 2012-11-18 MED ORDER — FENTANYL CITRATE 0.05 MG/ML IJ SOLN: 50.0000 ug | INTRAMUSCULAR | Status: DC | PRN

## 2012-11-18 MED ORDER — HYDROMORPHONE HCL PF 1 MG/ML IJ SOLN
0.2500 mg | INTRAMUSCULAR | Status: DC | PRN
  Administered 2012-11-18 (×3): 0.5 mg via INTRAVENOUS

## 2012-11-18 MED ORDER — OXYCODONE HCL 5 MG PO TABS: 5.0000 mg | ORAL_TABLET | ORAL | Status: DC | PRN

## 2012-11-18 MED ORDER — OXYCODONE HCL 5 MG/5ML PO SOLN: 5.0000 mg | Freq: Once | ORAL | Status: AC | PRN

## 2012-11-18 MED ORDER — SODIUM CHLORIDE 0.9 % IV SOLN: INTRAVENOUS | Status: DC

## 2012-11-18 MED ORDER — FENTANYL CITRATE 0.05 MG/ML IJ SOLN
INTRAMUSCULAR | Status: DC | PRN
  Administered 2012-11-18 (×4): 50 ug via INTRAVENOUS

## 2012-11-18 MED ORDER — OXYCODONE HCL 5 MG PO TABS
5.0000 mg | ORAL_TABLET | Freq: Once | ORAL | Status: AC | PRN
  Administered 2012-11-18: 5 mg via ORAL

## 2012-11-18 MED ORDER — DEXAMETHASONE SODIUM PHOSPHATE 4 MG/ML IJ SOLN
INTRAMUSCULAR | Status: DC | PRN
  Administered 2012-11-18: 10 mg via INTRAVENOUS

## 2012-11-18 SURGICAL SUPPLY — 79 items
ADH SKN CLS APL DERMABOND .7 (GAUZE/BANDAGES/DRESSINGS) ×1
APL SKNCLS STERI-STRIP NONHPOA (GAUZE/BANDAGES/DRESSINGS)
APPLIER CLIP 11 MED OPEN (CLIP)
APPLIER CLIP 9.375 MED OPEN (MISCELLANEOUS)
APR CLP MED 11 20 MLT OPN (CLIP)
APR CLP MED 9.3 20 MLT OPN (MISCELLANEOUS)
BANDAGE ELASTIC 6 VELCRO ST LF (GAUZE/BANDAGES/DRESSINGS) IMPLANT
BENZOIN TINCTURE PRP APPL 2/3 (GAUZE/BANDAGES/DRESSINGS) IMPLANT
BINDER BREAST XLRG (GAUZE/BANDAGES/DRESSINGS) ×1 IMPLANT
BLADE HEX COATED 2.75 (ELECTRODE) ×2 IMPLANT
BLADE SURG 10 STRL SS (BLADE) IMPLANT
BLADE SURG 15 STRL LF DISP TIS (BLADE) ×2 IMPLANT
BLADE SURG 15 STRL SS (BLADE) ×4
CANISTER SUCTION 1200CC (MISCELLANEOUS) ×2 IMPLANT
CHLORAPREP W/TINT 26ML (MISCELLANEOUS) ×2 IMPLANT
CLIP APPLIE 11 MED OPEN (CLIP) ×1 IMPLANT
CLIP APPLIE 9.375 MED OPEN (MISCELLANEOUS) ×1 IMPLANT
CLOTH BEACON ORANGE TIMEOUT ST (SAFETY) ×2 IMPLANT
COVER MAYO STAND STRL (DRAPES) ×2 IMPLANT
COVER TABLE BACK 60X90 (DRAPES) ×2 IMPLANT
DECANTER SPIKE VIAL GLASS SM (MISCELLANEOUS) IMPLANT
DERMABOND ADVANCED (GAUZE/BANDAGES/DRESSINGS) ×1
DERMABOND ADVANCED .7 DNX12 (GAUZE/BANDAGES/DRESSINGS) IMPLANT
DEVICE DUBIN W/COMP PLATE 8390 (MISCELLANEOUS) ×1 IMPLANT
DRAIN CHANNEL 19F RND (DRAIN) IMPLANT
DRAIN HEMOVAC 1/8 X 5 (WOUND CARE) IMPLANT
DRAPE LAPAROSCOPIC ABDOMINAL (DRAPES) ×2 IMPLANT
DRAPE LAPAROTOMY TRNSV 102X78 (DRAPE) IMPLANT
DRAPE PED LAPAROTOMY (DRAPES) ×2 IMPLANT
DRAPE UTILITY XL STRL (DRAPES) ×2 IMPLANT
DRSG PAD ABDOMINAL 8X10 ST (GAUZE/BANDAGES/DRESSINGS) ×2 IMPLANT
ELECT REM PT RETURN 9FT ADLT (ELECTROSURGICAL) ×2
ELECTRODE REM PT RTRN 9FT ADLT (ELECTROSURGICAL) ×1 IMPLANT
EVACUATOR SILICONE 100CC (DRAIN) IMPLANT
GAUZE SPONGE 4X4 12PLY STRL LF (GAUZE/BANDAGES/DRESSINGS) IMPLANT
GAUZE SPONGE 4X4 16PLY XRAY LF (GAUZE/BANDAGES/DRESSINGS) IMPLANT
GLOVE BIO SURGEON STRL SZ 6.5 (GLOVE) ×1 IMPLANT
GLOVE BIOGEL PI IND STRL 7.0 (GLOVE) IMPLANT
GLOVE BIOGEL PI INDICATOR 7.0 (GLOVE) ×1
GLOVE EUDERMIC 7 POWDERFREE (GLOVE) ×2 IMPLANT
GOWN PREVENTION PLUS XLARGE (GOWN DISPOSABLE) ×2 IMPLANT
GOWN PREVENTION PLUS XXLARGE (GOWN DISPOSABLE) ×2 IMPLANT
KIT MARKER MARGIN INK (KITS) ×2 IMPLANT
NDL HYPO 25X1 1.5 SAFETY (NEEDLE) ×1 IMPLANT
NEEDLE HYPO 22GX1.5 SAFETY (NEEDLE) IMPLANT
NEEDLE HYPO 25X1 1.5 SAFETY (NEEDLE) ×2 IMPLANT
NS IRRIG 1000ML POUR BTL (IV SOLUTION) ×2 IMPLANT
PACK BASIN DAY SURGERY FS (CUSTOM PROCEDURE TRAY) ×2 IMPLANT
PAD ALCOHOL SWAB (MISCELLANEOUS) ×2 IMPLANT
PENCIL BUTTON HOLSTER BLD 10FT (ELECTRODE) ×2 IMPLANT
PIN SAFETY STERILE (MISCELLANEOUS) IMPLANT
SLEEVE SCD COMPRESS KNEE MED (MISCELLANEOUS) ×1 IMPLANT
SPONGE GAUZE 4X4 12PLY (GAUZE/BANDAGES/DRESSINGS) ×2 IMPLANT
SPONGE LAP 18X18 X RAY DECT (DISPOSABLE) IMPLANT
SPONGE LAP 4X18 X RAY DECT (DISPOSABLE) ×2 IMPLANT
STAPLER VISISTAT 35W (STAPLE) IMPLANT
STRIP CLOSURE SKIN 1/2X4 (GAUZE/BANDAGES/DRESSINGS) IMPLANT
SUT ETHILON 3 0 FSL (SUTURE) IMPLANT
SUT ETHILON 4 0 PS 2 18 (SUTURE) IMPLANT
SUT MNCRL AB 4-0 PS2 18 (SUTURE) ×6 IMPLANT
SUT SILK 2 0 SH (SUTURE) ×4 IMPLANT
SUT VIC AB 2-0 CT1 27 (SUTURE)
SUT VIC AB 2-0 CT1 TAPERPNT 27 (SUTURE) IMPLANT
SUT VIC AB 2-0 SH 27 (SUTURE)
SUT VIC AB 2-0 SH 27XBRD (SUTURE) IMPLANT
SUT VIC AB 3-0 FS2 27 (SUTURE) IMPLANT
SUT VIC AB 3-0 SH 27 (SUTURE)
SUT VIC AB 3-0 SH 27X BRD (SUTURE) IMPLANT
SUT VIC AB 4-0 P-3 18XBRD (SUTURE) IMPLANT
SUT VIC AB 4-0 P3 18 (SUTURE)
SUT VICRYL 3-0 CR8 SH (SUTURE) ×2 IMPLANT
SUT VICRYL 4-0 PS2 18IN ABS (SUTURE) IMPLANT
SYR BULB 3OZ (MISCELLANEOUS) IMPLANT
SYR CONTROL 10ML LL (SYRINGE) ×2 IMPLANT
TAPE HYPAFIX 4 X10 (GAUZE/BANDAGES/DRESSINGS) IMPLANT
TOWEL OR 17X24 6PK STRL BLUE (TOWEL DISPOSABLE) ×4 IMPLANT
TOWEL OR NON WOVEN STRL DISP B (DISPOSABLE) ×2 IMPLANT
TUBE CONNECTING 20X1/4 (TUBING) ×2 IMPLANT
YANKAUER SUCT BULB TIP NO VENT (SUCTIONS) ×2 IMPLANT

## 2012-11-18 NOTE — Transfer of Care (Signed)
Immediate Anesthesia Transfer of Care Note  Patient: Rachael Russell  Procedure(s) Performed: Procedure(s) with comments: excise ductal system left breast. subarealar. left partial mastectomy with radiographic guidance (Left) - excise ductal system left breast. subarealar. left partial mastectomy with radiographic guidance EXCISION DUCTAL SYSTEM BREAST (Left)  Patient Location: PACU  Anesthesia Type:General  Level of Consciousness: awake, alert  and oriented  Airway & Oxygen Therapy: Patient Spontanous Breathing and Patient connected to face mask oxygen  Post-op Assessment: Report given to PACU RN and Post -op Vital signs reviewed and stable  Post vital signs: Reviewed and stable  Complications: No apparent anesthesia complications

## 2012-11-18 NOTE — Anesthesia Preprocedure Evaluation (Addendum)
Anesthesia Evaluation  Patient identified by MRN, date of birth, ID band Patient awake    Reviewed: Allergy & Precautions, H&P , NPO status , Patient's Chart, lab work & pertinent test results  Airway Mallampati: I TM Distance: >3 FB Neck ROM: Full    Dental  (+) Teeth Intact and Dental Advisory Given   Pulmonary  breath sounds clear to auscultation        Cardiovascular hypertension, Pt. on medications Rhythm:Regular Rate:Normal     Neuro/Psych    GI/Hepatic GERD-  Medicated and Controlled,  Endo/Other    Renal/GU      Musculoskeletal   Abdominal   Peds  Hematology   Anesthesia Other Findings   Reproductive/Obstetrics                          Anesthesia Physical Anesthesia Plan  ASA: II  Anesthesia Plan: General   Post-op Pain Management:    Induction: Intravenous  Airway Management Planned: LMA  Additional Equipment:   Intra-op Plan:   Post-operative Plan: Extubation in OR  Informed Consent: I have reviewed the patients History and Physical, chart, labs and discussed the procedure including the risks, benefits and alternatives for the proposed anesthesia with the patient or authorized representative who has indicated his/her understanding and acceptance.   Dental advisory given  Plan Discussed with: CRNA, Anesthesiologist and Surgeon  Anesthesia Plan Comments:         Anesthesia Quick Evaluation  

## 2012-11-18 NOTE — Interval H&P Note (Signed)
History and Physical Interval Note:  11/18/2012 12:45 PM  Rachael Russell  has presented today for surgery, with the diagnosis of multiple papillomas   The goals and the various methods of treatment have been discussed with the patient and family. After consideration of risks, benefits and other options for treatment, the patient has consented to  Procedure(s): BREAST LUMPECTOMY WITH NEEDLE LOCALIZATION (Left) EXCISION DUCTAL SYSTEM BREAST (Left) as a surgical intervention .  The patient's history has been reviewed, patient examined, no change in status, stable for surgery.  I have reviewed the patient's chart and labs.  Questions were answered to the patient's satisfaction.     Ernestene Mention

## 2012-11-18 NOTE — Telephone Encounter (Signed)
I'm glad she is doing well -thanks for the update and I will keep following her reports in the computer

## 2012-11-18 NOTE — Anesthesia Procedure Notes (Signed)
Procedure Name: LMA Insertion Performed by: Cameo Shewell W Pre-anesthesia Checklist: Patient identified, Timeout performed, Emergency Drugs available, Suction available and Patient being monitored Patient Re-evaluated:Patient Re-evaluated prior to inductionOxygen Delivery Method: Circle system utilized Preoxygenation: Pre-oxygenation with 100% oxygen Intubation Type: IV induction Ventilation: Mask ventilation without difficulty LMA: LMA inserted LMA Size: 4.0 Number of attempts: 1 Placement Confirmation: breath sounds checked- equal and bilateral and positive ETCO2 Tube secured with: Tape Dental Injury: Teeth and Oropharynx as per pre-operative assessment      

## 2012-11-18 NOTE — Telephone Encounter (Signed)
Spoke with pt and she had lumpectomy left breast at [facility] today. Pt on her way home now. Pt said she is doing OK.

## 2012-11-18 NOTE — Anesthesia Postprocedure Evaluation (Signed)
  Anesthesia Post-op Note  Patient: Rachael Russell  Procedure(s) Performed: Procedure(s) with comments: excise ductal system left breast. subarealar. left partial mastectomy with radiographic guidance (Left) - excise ductal system left breast. subarealar. left partial mastectomy with radiographic guidance EXCISION DUCTAL SYSTEM BREAST (Left)  Patient Location: PACU  Anesthesia Type:General  Level of Consciousness: awake, alert  and oriented  Airway and Oxygen Therapy: Patient Spontanous Breathing  Post-op Pain: mild  Post-op Assessment: Post-op Vital signs reviewed  Post-op Vital Signs: Reviewed  Complications: No apparent anesthesia complications

## 2012-11-18 NOTE — Op Note (Signed)
Patient Name:           Rachael Russell   Date of Surgery:        11/18/2012  Pre op Diagnosis:      Bloody nipple discharge, left breast, suspect intraductal papilloma.                                       Multiple intraductal peripheral filling defects, 2:00 position, left breast. Suspect multiple peripheral papillomas  Post op Diagnosis:    Same  Procedure:                1)   Excision retroareolar  ductal system left breast, 2)left partial mastectomy with radiographic guidance, 2:00 position, separating incision  Surgeon:                     Angelia Mould. Derrell Lolling, M.D., FACS  Assistant:                      None  Operative Indications:   Rachael Russell is a 47 y.o. female. She is referred by Dr. Rosalva Ferron at [facility] for evaluation of a left bloody nipple discharge and presumed multiple intraductal papillomas, subareolar, and peripheral.  The patient has been getting mammograms since she was about 47 years old because her mother and maternal grandmother had breast cancer. She had an image guided biopsy of the right breast in 2009 which was benign. On February 28 she began to notice a bloody left nipple discharge after compressing her breast. Mammograms of the breast showed that the breast is heterogeneously dense but no focal abnormality. Ultrasound showed ductal ectasia at the 12:00 retroareolar region but no defect. Left breast ductogram showed 2 findings, in the immediate retroareolar portion of the breast there were multiple filling defects the largest being about 6-7 mm. Secondly, there appeared to be several small filling defects further out of the ductal system. I discussed this by phone with Dr. Tilda Burrow today and he stated that the peripheral lesions were at the 2:00 position at least 5 cm from the nipple. He also strongly recommended breast MRI because of the patient's multiple risk factors. MRI did not show any abnormal enhancement. The patient is otherwise fairly healthy.  Family history  reveals breast cancer in the mother with lumpectomy 2012,. Maternal grandmother had bilateral total mastectomies but the patient is not sure whether she had bilateral cancer or not. There is no family history of ovarian cancer. If any history of colon cancer    Operative Findings:       The discharging duct in the left nipple was centrally located and had blue dye in it. This was intubated with a lacrimal duct probe to facilitate localization. Peripherally located filling defects were marked on the skin with an "X" by Dr. Rogers Blocker after doing a ductogram this morning. These areas were excised following the guidance of the skin and discussion with Dr. Rogers Blocker.  Procedure in Detail:         Following the localization procedure, the patient was brought to the operating room at [facility], general anesthesia was induced. The left breast was prepped and draped in a sterile fashion. Intravenous antibiotics were given. Surgical time out was held. 0.5% Marcaine with epinephrine was used as a local infiltration anesthetic I was able to repeatedly reproduce the discharge from the central nipple duct and  intubated that with a lacrimal duct probe. This went directly posteriorly and slightly inferiorly. I chose to make an incision at the areolar margin inferiorly. Dissection was carried down under the areola and nipple. When I encountered a lacrimal duct probe I secured that with Allis clamps and removed the lacrimal duct probe. I then dissected all of the subareolar tissue around that for about 2-1/2 cm. I marked the specimen with silk sutures to orient the pathologist and sent it fresh to pathology. Hemostasis was excellent and achieved electrocautery. The wound was irrigated with saline. The breast tissues were carefully re-approximated with multiple 3-0 Vicryl sutures and the skin closed with a running subcuticular suture of 4-0 Monocryl and Dermabond.  There were 2 marks on the breast skin laterally at the 2:30 position. I  made a curvilinear incision slightly peripheral to these marks. Dissection was carried down into the breast tissue and I simply excised about 3 cm diameter area around where we thought the tissue should be. Once I removed this golf ball sized piece of tissue I marked it with silk sutures to orient the pathologist and sent to the lab. Hemostasis was excellent and achieved with electrocautery. The wound was irrigated with saline. The deep breast tissues were closed with 3-0 Vicryl sutures and the skin closed with a running subcuticular suture of 4-0 Monocryl and Dermabond. Padding and a breast binder were placed. The patient was taken to recovery room in stable condition. EBL 20 cc or less. Counts correct. Complications none.      Angelia Mould. Derrell Lolling, M.D., FACS General and Minimally Invasive Surgery Breast and Colorectal Surgery  11/18/2012 2:12 PM

## 2012-11-21 ENCOUNTER — Encounter (HOSPITAL_BASED_OUTPATIENT_CLINIC_OR_DEPARTMENT_OTHER): Payer: Self-pay | Admitting: General Surgery

## 2012-11-21 NOTE — Progress Notes (Signed)
Quick Note:  Inform patient of Pathology report,.Tell her that both biopsy areas showed noncancerous papillomas. This is very good news. I will discuss this further with her at her next office visit. ______

## 2012-11-22 ENCOUNTER — Telehealth (INDEPENDENT_AMBULATORY_CARE_PROVIDER_SITE_OTHER): Payer: Self-pay

## 2012-11-22 NOTE — Telephone Encounter (Signed)
Message copied by Ivory Broad on Tue Nov 22, 2012 10:03 AM ------      Message from: Ernestene Mention      Created: Mon Nov 21, 2012  5:45 PM       Inform patient of Pathology report,.Tell her that both biopsy areas showed noncancerous papillomas. This is very good news. I will discuss this further with her at her next office visit. ------

## 2012-11-22 NOTE — Telephone Encounter (Signed)
I called the pt on her cell and let her know of her results.  She has a follow up appointment.

## 2012-11-23 ENCOUNTER — Encounter (INDEPENDENT_AMBULATORY_CARE_PROVIDER_SITE_OTHER): Payer: Self-pay

## 2012-12-01 ENCOUNTER — Telehealth (INDEPENDENT_AMBULATORY_CARE_PROVIDER_SITE_OTHER): Payer: Self-pay

## 2012-12-01 NOTE — Telephone Encounter (Signed)
Pt called wanting to know if she can use deodorant under arms. I advised her she can continue deodorant.

## 2012-12-05 ENCOUNTER — Ambulatory Visit (INDEPENDENT_AMBULATORY_CARE_PROVIDER_SITE_OTHER): Payer: 59 | Admitting: General Surgery

## 2012-12-05 ENCOUNTER — Encounter (INDEPENDENT_AMBULATORY_CARE_PROVIDER_SITE_OTHER): Payer: Self-pay | Admitting: General Surgery

## 2012-12-05 VITALS — BP 122/82 | HR 78 | Temp 97.4°F | Resp 18 | Ht 65.0 in | Wt 178.5 lb

## 2012-12-05 DIAGNOSIS — N6012 Diffuse cystic mastopathy of left breast: Secondary | ICD-10-CM

## 2012-12-05 DIAGNOSIS — N6019 Diffuse cystic mastopathy of unspecified breast: Secondary | ICD-10-CM | POA: Insufficient documentation

## 2012-12-05 DIAGNOSIS — D249 Benign neoplasm of unspecified breast: Secondary | ICD-10-CM

## 2012-12-05 NOTE — Patient Instructions (Signed)
You appear to be recovering from your left breast surgery without any obvious surgical complications.  The thickening of the incision  should resolve in 4-6 months.  The pain below your breast along the rib cage should go away in 6 weeks or so.  You are at increased risk for breast cancer long term due to your family history and the presence of multiple intraductal papillomas.  You will be referred to the high risk breast clinic for screening and to see if there are any other strategies to lower your risk in the future.  Be sure to get annual mammograms and annual breast exam.  Return to see Dr. Derrell Lolling if any further problems arise.

## 2012-12-05 NOTE — Progress Notes (Signed)
Patient ID: Rachael Russell, female   DOB: 1966/08/17, 47 y.o.   MRN: [id number] History: This patient returns for a postop visit. On 11/18/2012 she underwent excision of retroareolar ductal system and left partial mastectomy with radiographic guidance the 3:00 position, separate incisions. Both areas showed intraductal papillomas with usual ductal hyperplasia. She appears to be healing uneventfully. Still has a little bit of pain and thickening but no major problems. We spent a long time discussing her risk for breast cancer in the future. I gave her her pathology report. I told her that because she has multiple intraductal papillomas and a proliferative breast disorder and family history of breast cancer in the mother and grandmother, that she was at increased risk.  Exam: Patient looks well. No distress. Left breast exam shows circumareolar incision and the more laterally placed incision healing normally. A slight amount of thickening and tenderness but not bad. No hematoma. No seroma. Skin healthy. No drainage. Excellent symmetry and contour. Really no cosmetic defect otherwise.  Assessment: Multiple intraductal papillomas of left breast, recovering uneventfully following excision of 2 separate areas, subareolar, and lateral peripheral Positive family history breast cancer in mother and grandmother  Plan: She will be referred to Dr. Drue Second at the high risk breast clinic for a consultation Annual mammography and annual breast exam is strongly advised Return to see me if any further problems arise.   Angelia Mould. Derrell Lolling, M.D., [facility] General and Minimally invasive Surgery Breast and Colorectal Surgery Office:   [phone] Pager:   [phone]

## 2013-01-05 LAB — CBC WITH DIFFERENTIAL
Basophils Absolute: 0 x10E3/uL (ref 0.0–0.2)
Basos: 0 % (ref 0–3)
Eos: 1 % (ref 0–5)
Eosinophils Absolute: 0.1 x10E3/uL (ref 0.0–0.4)
HCT: 35.5 % (ref 34.0–46.6)
Hemoglobin: 12.4 g/dL (ref 11.1–15.9)
Lymphocytes Absolute: 2.8 x10E3/uL (ref 0.7–3.1)
Lymphs: 29 % (ref 14–46)
MCH: 30.6 pg (ref 26.6–33.0)
MCHC: 34.9 g/dL (ref 31.5–35.7)
MCV: 88 fL (ref 79–97)
Monocytes Absolute: 0.8 x10E3/uL (ref 0.1–0.9)
Monocytes: 8 % (ref 4–12)
Neutrophils Absolute: 6 x10E3/uL (ref 1.4–7.0)
Neutrophils Relative %: 62 % (ref 40–74)
Platelets: 301 x10E3/uL (ref 155–379)
RBC: 4.05 x10E6/uL (ref 3.77–5.28)
RDW: 12.9 % (ref 12.3–15.4)
WBC: 9.8 x10E3/uL (ref 3.4–10.8)

## 2013-01-05 LAB — COMPREHENSIVE METABOLIC PANEL WITH GFR
ALT: 11 IU/L (ref 0–32)
AST: 14 IU/L (ref 0–40)
Albumin/Globulin Ratio: 1.9 (ref 1.1–2.5)
Albumin: 4.2 g/dL (ref 3.5–5.5)
Alkaline Phosphatase: 67 IU/L (ref 39–117)
BUN/Creatinine Ratio: 13 (ref 9–23)
BUN: 11 mg/dL (ref 6–24)
CO2: 28 mmol/L (ref 19–28)
Calcium: 9.5 mg/dL (ref 8.7–10.2)
Chloride: 103 mmol/L (ref 96–108)
Creatinine, Ser: 0.82 mg/dL (ref 0.57–1.00)
GFR calc Af Amer: 99 mL/min/1.73
GFR calc non Af Amer: 85 mL/min/1.73
Globulin, Total: 2.2 g/dL (ref 1.5–4.5)
Glucose: 95 mg/dL (ref 65–99)
Potassium: 4.3 mmol/L (ref 3.5–5.2)
Sodium: 138 mmol/L (ref 134–144)
Total Bilirubin: 0.4 mg/dL (ref 0.0–1.2)
Total Protein: 6.4 g/dL (ref 6.0–8.5)

## 2013-01-10 ENCOUNTER — Other Ambulatory Visit: Payer: Self-pay | Admitting: Family Medicine

## 2013-01-24 ENCOUNTER — Telehealth: Payer: Self-pay

## 2013-01-24 NOTE — Telephone Encounter (Signed)
I will see her then, thanks  

## 2013-01-24 NOTE — Telephone Encounter (Signed)
Pt said 2 weeks ago started exercise program where she walks in AM and kick boxes in PM; pt said 01/21/13 both ankles were swollen at end of day (swelling goes down after feet up all night);today after walking pt noticed her hands were swollen also. No CP,SOB, difficulty breathing, h/a or dizziness. Pt took BP on 01/21/13 BP 137/93 and 01/22/13 BP 142/94. Pt has not taken BP since Sun. Advised pt to rest today and not exercise until pt sees Dr Milinda Antis on 01/25/13 at 2:45 pm. If pt condition changes or worsens pt will call back or go to UC. Pt voiced understanding.

## 2013-01-25 ENCOUNTER — Ambulatory Visit (INDEPENDENT_AMBULATORY_CARE_PROVIDER_SITE_OTHER): Payer: 59 | Admitting: Family Medicine

## 2013-01-25 ENCOUNTER — Encounter: Payer: Self-pay | Admitting: Family Medicine

## 2013-01-25 VITALS — BP 126/82 | HR 63 | Temp 98.2°F | Ht 65.0 in | Wt 180.5 lb

## 2013-01-25 DIAGNOSIS — R609 Edema, unspecified: Secondary | ICD-10-CM

## 2013-01-25 DIAGNOSIS — I10 Essential (primary) hypertension: Secondary | ICD-10-CM

## 2013-01-25 MED ORDER — LISINOPRIL 20 MG PO TABS: ORAL_TABLET | ORAL | Status: DC

## 2013-01-25 NOTE — Assessment & Plan Note (Signed)
Dependent- suspect hot weather/ inc salt in diet and forgetting her torsemide one day played a role Reassuring exam Will watch bp and adv lisinopril also  F/u as planned for PE and lab

## 2013-01-25 NOTE — Assessment & Plan Note (Signed)
Ok here but high at home Will inc lisinopril to 30 mg and adv as needed-pt will continue to check  Enc exercise and cut back in sodium

## 2013-01-25 NOTE — Progress Notes (Signed)
Subjective:    Patient ID: Rachael Russell, female    DOB: 1966/06/19, 47 y.o.   MRN: [id number]  HPI Here with swelling of hands and legs   Exercising  Walking ams  - r hand swollen after walking Monday  Kick boxing pm- last Thursday - legs felt tight and then swelling occurred sat night (hard to get shoes on)  Worse on R - both arm and leg   L breast surgery in past   Takes torsemide 20 mg every day  Of note usually takes in am but sat took in pm   Wt is up 1 1/2 lb   Is eating soup this week - soup and fruit and vegetables  Makes her own soup - frozen veg and boullion (low sodium)/ tomato and cabbage    bp was elevated at home mildly - on sat night , Sunday was also high  Yesterday had a brief nosebleed and felt a bit dizzy--154/100, then 151/103, then 134/90  Was not a stressful day   No cp No sob/ pnd or orthopnea   Patient Active Problem List   Diagnosis Date Noted  . 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 Nipple discharge, bloody 10/21/2012  . Routine general medical examination at a health care facility 05/11/2011  . Routine gynecological examination 05/11/2011  . Palpitations 12/19/2010  . Stress reaction, emotional 12/19/2010  . Allergic rhinitis 12/19/2010  . Fibrocystic breast disease 12/19/2010  . LEUKORRHEA 08/01/2010  . GERD 04/14/2010  . PLANTAR FASCIITIS, LEFT 04/14/2010  . 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 ASCUS PAP 07/12/2009  . SINUSITIS, CHRONIC FRONTAL 04/22/2007  . ANEMIA, IRON DEFICIENCY NOS 01/04/2007  . HYPERLIPIDEMIA 12/21/2006  . METABOLIC SYNDROME X 12/21/2006  . HYPERTENSION 12/21/2006  . EDEMA 12/21/2006   Past Medical History  Diagnosis Date  . Hyperlipidemia   . Hypertension   . Breast mass, right 03/14/08    mammogram and ultrasound  . Wears glasses   . GERD (gastroesophageal reflux disease)    Past Surgical History  Procedure Laterality Date  . Cesarean section  [id number]  . [facility]    .  Hernia repair  2006    umb  . Tonsillectomy    . Dilation and curettage of uterus  1998  . Breast lumpectomy with needle localization Left 11/18/2012    Procedure: excise ductal system left breast. subarealar. left partial mastectomy with radiographic guidance;  Surgeon: Ernestene Mention, MD;  Location: [facility];  Service: General;  Laterality: Left;  excise ductal system left breast. subarealar. left partial mastectomy with radiographic guidance  . Breast ductal system excision Left 11/18/2012    Procedure: EXCISION DUCTAL SYSTEM BREAST;  Surgeon: Ernestene Mention, MD;  Location: [facility];  Service: General;  Laterality: Left;  . Breast surgery     History  Substance Use Topics  . Smoking status: Never Smoker   . Smokeless tobacco: Not on file  . Alcohol Use: No   Family History  Problem Relation Age of Onset  . Hypertension Mother   . Arthritis Mother     osteoarthritis  . Breast cancer Mother   . Hypertension Father   . Arthritis Father     osteoarthritis  . Cancer Maternal Grandmother     breast   Allergies  Allergen Reactions  . Ferrous Sulfate     constipation  . Penicillins     REACTION: ? if allergic as child  . Prilosec (  Omeprazole Magnesium)     Dizziness    Current Outpatient Prescriptions on File Prior to Visit  Medication Sig Dispense Refill  . acetaminophen (TYLENOL) 500 MG tablet Take 500 mg by mouth every 6 (six) hours as needed for pain.      . cyclobenzaprine (FLEXERIL) 10 MG tablet Take one half to one by mouth up to 3 times a day as needed for neck pain.       . diphenhydrAMINE (BENADRYL) 25 mg capsule OTC as directed.       . potassium chloride SA (KLOR-CON M20) 20 MEQ tablet Take 1 tablet (20 mEq total) by mouth daily.  90 tablet  2  . ranitidine (ZANTAC) 150 MG tablet Take 1 tablet (150 mg total) by mouth daily.  90 tablet  1  . torsemide (DEMADEX) 20 MG tablet Take 1 tablet (20 mg total) by mouth daily.  90 tablet  1    No current facility-administered medications on file prior to visit.    Review of Systems Review of Systems  Constitutional: Negative for fever, appetite change, fatigue and unexpected weight change.  Eyes: Negative for pain and visual disturbance.  Respiratory: Negative for cough and shortness of breath.   Cardiovascular: Negative for cp or palpitations   neg for sob with exertion  Gastrointestinal: Negative for nausea, diarrhea and constipation.  Genitourinary: Negative for urgency and frequency. neg for swelling of face or eyes  Skin: Negative for pallor or rash   Neurological: Negative for weakness, light-headedness, numbness and headaches.  Hematological: Negative for adenopathy. Does not bruise/bleed easily.  Psychiatric/Behavioral: Negative for dysphoric mood. The patient is not nervous/anxious.         Objective:   Physical Exam  Constitutional: She appears well-developed and well-nourished. No distress.  HENT:  Head: Normocephalic and atraumatic.  Right Ear: External ear normal.  Left Ear: External ear normal.  Nose: Nose normal.  Mouth/Throat: Oropharynx is clear and moist.  Eyes: Conjunctivae and EOM are normal. Pupils are equal, round, and reactive to light. No scleral icterus.  Neck: Normal range of motion. Neck supple. Carotid bruit is not present. No thyromegaly present.  Cardiovascular: Normal rate, regular rhythm and intact distal pulses.  Exam reveals no gallop and no friction rub.   No murmur heard. Pulmonary/Chest: Effort normal and breath sounds normal. No respiratory distress. She has no wheezes. She has no rales.  Good air exch No crackles  Abdominal: Soft. Bowel sounds are normal. She exhibits no distension, no abdominal bruit and no mass. There is no tenderness.  Musculoskeletal: She exhibits edema. She exhibits no tenderness.  Very slt edema R hand over MCP joints No pitting edema anywhere   Lymphadenopathy:    She has no cervical adenopathy.   Neurological: She is alert. She has normal reflexes.  Skin: Skin is warm and dry. No rash noted. No erythema. No pallor.  Psychiatric: She has a normal mood and affect.          Assessment & Plan:

## 2013-01-25 NOTE — Patient Instructions (Addendum)
Keep exercising Keep cool when you can and elevate feet when you sit whenever possible Less salt/ more water/ try to get some protein with every meal also  Increase lisinopril to 1 1/2 pills once daily  Keep an eye on blood pressure at home

## 2013-02-16 ENCOUNTER — Other Ambulatory Visit (INDEPENDENT_AMBULATORY_CARE_PROVIDER_SITE_OTHER): Payer: Self-pay | Admitting: General Surgery

## 2013-02-16 DIAGNOSIS — C50912 Malignant neoplasm of unspecified site of left female breast: Secondary | ICD-10-CM

## 2013-03-07 ENCOUNTER — Encounter: Payer: Self-pay | Admitting: Family Medicine

## 2013-03-07 ENCOUNTER — Telehealth: Payer: Self-pay | Admitting: *Deleted

## 2013-03-07 ENCOUNTER — Ambulatory Visit (INDEPENDENT_AMBULATORY_CARE_PROVIDER_SITE_OTHER): Payer: 59 | Admitting: Family Medicine

## 2013-03-07 VITALS — BP 124/86 | HR 63 | Temp 98.2°F | Ht 65.0 in | Wt 176.5 lb

## 2013-03-07 DIAGNOSIS — B029 Zoster without complications: Secondary | ICD-10-CM

## 2013-03-07 DIAGNOSIS — Z8619 Personal history of other infectious and parasitic diseases: Secondary | ICD-10-CM | POA: Insufficient documentation

## 2013-03-07 NOTE — Progress Notes (Signed)
Subjective:    Patient ID: Rachael Russell, female    DOB: Dec 23, 1965, 47 y.o.   MRN: [id number]  HPI Here with a rash  Very itchy and sore - ? If a bit swollen Is under breast on one side  Has traveled - no one else got it  She was in a hot tub and pool   She put alcohol on it  Also abx ointment otc  Neither worked  Then tried family member's px cream/ hydrocortisone Took oral benadryl at night   Feels ok  No fever    She had issues with wearing a bra on vacation with recent surgery   Patient Active Problem List   Diagnosis Date Noted  . 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 Nipple discharge, bloody 10/21/2012  . Routine general medical examination at a health care facility 05/11/2011  . Routine gynecological examination 05/11/2011  . Palpitations 12/19/2010  . Stress reaction, emotional 12/19/2010  . Allergic rhinitis 12/19/2010  . Fibrocystic breast disease 12/19/2010  . LEUKORRHEA 08/01/2010  . GERD 04/14/2010  . PLANTAR FASCIITIS, LEFT 04/14/2010  . 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 ASCUS PAP 07/12/2009  . SINUSITIS, CHRONIC FRONTAL 04/22/2007  . ANEMIA, IRON DEFICIENCY NOS 01/04/2007  . HYPERLIPIDEMIA 12/21/2006  . METABOLIC SYNDROME X 12/21/2006  . HYPERTENSION 12/21/2006  . EDEMA 12/21/2006   Past Medical History  Diagnosis Date  . Hyperlipidemia   . Hypertension   . Breast mass, right 03/14/08    mammogram and ultrasound  . Wears glasses   . GERD (gastroesophageal reflux disease)    Past Surgical History  Procedure Laterality Date  . Cesarean section  [id number]  . Tsa    . Hernia repair  2006    umb  . Tonsillectomy    . Dilation and curettage of uterus  1998  . Breast lumpectomy with needle localization Left 11/18/2012    Procedure: excise ductal system left breast. subarealar. left partial mastectomy with radiographic guidance;  Surgeon: Ernestene Mention, MD;  Location: [facility];  Service:  General;  Laterality: Left;  excise ductal system left breast. subarealar. left partial mastectomy with radiographic guidance  . Breast ductal system excision Left 11/18/2012    Procedure: EXCISION DUCTAL SYSTEM BREAST;  Surgeon: Ernestene Mention, MD;  Location: [facility];  Service: General;  Laterality: Left;  . Breast surgery     History  Substance Use Topics  . Smoking status: Never Smoker   . Smokeless tobacco: Not on file  . Alcohol Use: No   Family History  Problem Relation Age of Onset  . Hypertension Mother   . Arthritis Mother     osteoarthritis  . Breast cancer Mother   . Hypertension Father   . Arthritis Father     osteoarthritis  . Cancer Maternal Grandmother     breast   Allergies  Allergen Reactions  . Ferrous Sulfate     constipation  . Penicillins     REACTION: ? if allergic as child  . Prilosec (Omeprazole Magnesium)     Dizziness    Current Outpatient Prescriptions on File Prior to Visit  Medication Sig Dispense Refill  . acetaminophen (TYLENOL) 500 MG tablet Take 500 mg by mouth every 6 (six) hours as needed for pain.      . cyclobenzaprine (FLEXERIL) 10 MG tablet Take one half to one by mouth up to 3 times a day as needed for neck pain.       Marland Kitchen  diphenhydrAMINE (BENADRYL) 25 mg capsule OTC as directed.       . potassium chloride SA (KLOR-CON M20) 20 MEQ tablet Take 1 tablet (20 mEq total) by mouth daily.  90 tablet  2  . torsemide (DEMADEX) 20 MG tablet Take 1 tablet (20 mg total) by mouth daily.  90 tablet  1   No current facility-administered medications on file prior to visit.    Review of Systems Review of Systems  Constitutional: Negative for fever, appetite change, fatigue and unexpected weight change.  Eyes: Negative for pain and visual disturbance.  Respiratory: Negative for cough and shortness of breath.   Cardiovascular: Negative for cp or palpitations    Gastrointestinal: Negative for nausea, diarrhea and constipation.   Genitourinary: Negative for urgency and frequency.  Skin: Negative for pallor and positive for rash with itch and soreness   Neurological: Negative for weakness, light-headedness, numbness and headaches.  Hematological: Negative for adenopathy. Does not bruise/bleed easily.  Psychiatric/Behavioral: Negative for dysphoric mood. The patient is not nervous/anxious.         Objective:   Physical Exam  Constitutional: She appears well-developed and well-nourished. No distress.  HENT:  Head: Normocephalic and atraumatic.  Eyes: Conjunctivae and EOM are normal. Pupils are equal, round, and reactive to light. Right eye exhibits no discharge. Left eye exhibits no discharge.  Neck: Normal range of motion. Neck supple.  Cardiovascular: Normal rate and regular rhythm.   Pulmonary/Chest: Effort normal and breath sounds normal.  Abdominal: Soft. Bowel sounds are normal.  Musculoskeletal: She exhibits tenderness. She exhibits no edema.  Lymphadenopathy:    She has no cervical adenopathy.  Neurological: She is alert. She has normal reflexes.  Skin: Skin is warm and dry. Rash noted. There is erythema.  Rash on L side T5-T6 dermatome -patches of drying vesicles noted that are slt tender and surrounded with small collar or erythema   Psychiatric: She has a normal mood and affect.          Assessment & Plan:

## 2013-03-07 NOTE — Patient Instructions (Addendum)
I think you have shingles  If pain worsens please let me know  Benadryl is ok for itch  Cold compress may help symptoms  Keep clean and dry  Avoid clothing friction

## 2013-03-07 NOTE — Telephone Encounter (Signed)
Keep her rash loosely covered with bandage or clothing - the major risk would be if the child was on steroids or other drugs that weaken the immune system  Good question

## 2013-03-07 NOTE — Telephone Encounter (Signed)
Pt left voicemail: Pt was seen today and diagnosed with shingles, pt was reading over the paper work you gave her and it said avoid contact with children with eczema, pt's son has eczema and pt wanted to know what does she need to do differently around her son, should she try to avoid him completely or just make sure he doesn't touch the rash, or should she have no physical contact with pt, pt just wanted a little clarification on what that means, please advise

## 2013-03-07 NOTE — Assessment & Plan Note (Signed)
Of T5 and 6 dermatomes on L in pt who has had breast surgery this year and much stress This is past 48 hours and out of the window for anti viral tx at this time  Disc tx opt-since pain is relatively mild (more itch) will use otc analgesics and benadryl prn  Update if not starting to improve in a week or if worsening   If able to afford it -highly recommend vaccine at 50 Handout given

## 2013-03-08 NOTE — Telephone Encounter (Signed)
Pt called back and scheduled a nurse appt for Rachael Russell to get his 2nd varicella vaccine

## 2013-03-08 NOTE — Telephone Encounter (Signed)
Left voicemail letting pt know Rachael Russell hasn't had his 2nd vaccine and he will need to get one, she can schedule a nurse visit to get him immunized and in the mean time just keep him away from her shingle rash

## 2013-03-08 NOTE — Telephone Encounter (Signed)
We really do not know yet how much protective effect the varicella vaccine will have re: later shingles  Nonetheless- it is a good idea to get him the 2nd vaccine eventually anyway

## 2013-03-08 NOTE — Telephone Encounter (Signed)
Pt advised, pt did want me to ask you a question about her sons, Finnley Lewis [phone]) is 19yrs old and he has had both of his varicella vaccines, but her son Teleshia Lemere [phone]) is 71 yrs old and he only has had 1 vaccine that I could find in his chart, pt is a little concerned and wants to know if he is at risk for getting her shingles and should he get the 2nd varicella vaccine, please advise

## 2013-03-09 ENCOUNTER — Telehealth: Payer: Self-pay | Admitting: *Deleted

## 2013-03-09 MED ORDER — TRAMADOL HCL 50 MG PO TABS: 50.0000 mg | ORAL_TABLET | Freq: Three times a day (TID) | ORAL | Status: DC | PRN

## 2013-03-09 NOTE — Telephone Encounter (Signed)
Pt wanted me to ask you if you could prescribe something for her shingles pain, you diagnosed pt with shingles on 03/07/13 , the pain is not to bad right now but she is afraid that it's getting a little worse and she doesn't want to have to go to an urgent care over the weekend if her pain gets really bad, pt would like something for the pain she would just take PRN, please advise

## 2013-03-09 NOTE — Telephone Encounter (Signed)
No problem- will try tramadol and warn her that it may make her sleepy Please call it in

## 2013-03-09 NOTE — Telephone Encounter (Signed)
Pt notified an Rx called in as prescribed

## 2013-04-02 ENCOUNTER — Other Ambulatory Visit: Payer: Self-pay | Admitting: Family Medicine

## 2013-04-13 ENCOUNTER — Encounter: Payer: Self-pay | Admitting: Radiology

## 2013-04-13 ENCOUNTER — Telehealth: Payer: Self-pay | Admitting: Family Medicine

## 2013-04-13 DIAGNOSIS — Z0000 Encounter for general adult medical examination without abnormal findings: Secondary | ICD-10-CM

## 2013-04-13 DIAGNOSIS — E785 Hyperlipidemia, unspecified: Secondary | ICD-10-CM

## 2013-04-13 NOTE — Telephone Encounter (Signed)
Message copied by Judy Pimple on Thu Apr 13, 2013  8:33 PM ------      Message from: Alvina Chou      Created: Tue Apr 11, 2013  3:20 PM      Regarding: Lab orders for Friday, 8.22.14       Patient is scheduled for CPX labs, please order future labs, Thanks , Terri       ------

## 2013-04-14 ENCOUNTER — Other Ambulatory Visit (INDEPENDENT_AMBULATORY_CARE_PROVIDER_SITE_OTHER): Payer: 59

## 2013-04-14 DIAGNOSIS — Z0000 Encounter for general adult medical examination without abnormal findings: Secondary | ICD-10-CM

## 2013-04-14 DIAGNOSIS — E785 Hyperlipidemia, unspecified: Secondary | ICD-10-CM

## 2013-04-15 LAB — COMPREHENSIVE METABOLIC PANEL
ALT: 12 IU/L (ref 0–32)
AST: 16 IU/L (ref 0–40)
Albumin/Globulin Ratio: 1.7 (ref 1.1–2.5)
Albumin: 4.3 g/dL (ref 3.5–5.5)
Alkaline Phosphatase: 70 IU/L (ref 39–117)
BUN/Creatinine Ratio: 12 (ref 9–23)
BUN: 11 mg/dL (ref 6–24)
CO2: 22 mmol/L (ref 18–29)
Calcium: 9.5 mg/dL (ref 8.7–10.2)
Chloride: 102 mmol/L (ref 97–108)
Creatinine, Ser: 0.92 mg/dL (ref 0.57–1.00)
GFR calc Af Amer: 86 mL/min/{1.73_m2} (ref >59)
GFR calc non Af Amer: 74 mL/min/{1.73_m2} (ref >59)
Globulin, Total: 2.5 g/dL (ref 1.5–4.5)
Glucose: 82 mg/dL (ref 65–99)
Potassium: 4.2 mmol/L (ref 3.5–5.2)
Sodium: 139 mmol/L (ref 134–144)
Total Bilirubin: 0.5 mg/dL (ref 0.0–1.2)
Total Protein: 6.8 g/dL (ref 6.0–8.5)

## 2013-04-15 LAB — CBC WITH DIFFERENTIAL
Basophils Absolute: 0 10*3/uL (ref 0.0–0.2)
Basos: 0 % (ref 0–3)
Eos: 1 % (ref 0–5)
Eosinophils Absolute: 0.1 10*3/uL (ref 0.0–0.4)
HCT: 35.4 % (ref 34.0–46.6)
Hemoglobin: 12.3 g/dL (ref 11.1–15.9)
Immature Grans (Abs): 0 10*3/uL (ref 0.0–0.1)
Immature Granulocytes: 0 % (ref 0–2)
Lymphocytes Absolute: 2.2 10*3/uL (ref 0.7–3.1)
Lymphs: 27 % (ref 14–46)
MCH: 30 pg (ref 26.6–33.0)
MCHC: 34.7 g/dL (ref 31.5–35.7)
MCV: 86 fL (ref 79–97)
Monocytes Absolute: 0.6 10*3/uL (ref 0.1–0.9)
Monocytes: 8 % (ref 4–12)
Neutrophils Absolute: 5.2 10*3/uL (ref 1.4–7.0)
Neutrophils Relative %: 64 % (ref 40–74)
Platelets: 343 10*3/uL (ref 150–379)
RBC: 4.1 x10E6/uL (ref 3.77–5.28)
RDW: 13.7 % (ref 12.3–15.4)
WBC: 8.2 10*3/uL (ref 3.4–10.8)

## 2013-04-15 LAB — LIPID PANEL
Chol/HDL Ratio: 3.1 ratio units (ref 0.0–4.4)
Cholesterol, Total: 228 mg/dL — ABNORMAL HIGH (ref 100–199)
HDL: 74 mg/dL (ref >39)
LDL Calculated: 142 mg/dL — ABNORMAL HIGH (ref 0–99)
Triglycerides: 59 mg/dL (ref 0–149)
VLDL Cholesterol Cal: 12 mg/dL (ref 5–40)

## 2013-04-15 LAB — TSH: TSH: 1.48 u[IU]/mL (ref 0.450–4.500)

## 2013-04-21 ENCOUNTER — Encounter: Payer: Self-pay | Admitting: Family Medicine

## 2013-04-21 ENCOUNTER — Ambulatory Visit (INDEPENDENT_AMBULATORY_CARE_PROVIDER_SITE_OTHER): Payer: 59 | Admitting: Family Medicine

## 2013-04-21 ENCOUNTER — Other Ambulatory Visit (HOSPITAL_COMMUNITY)
Admission: RE | Admit: 2013-04-21 | Discharge: 2013-04-21 | Disposition: A | Discharge status: Home / Self Care | Payer: 59 | Source: Ambulatory Visit | Attending: Family Medicine | Admitting: Family Medicine

## 2013-04-21 VITALS — BP 122/72 | HR 78 | Temp 98.2°F | Ht 65.25 in | Wt 179.0 lb

## 2013-04-21 DIAGNOSIS — I10 Essential (primary) hypertension: Secondary | ICD-10-CM

## 2013-04-21 DIAGNOSIS — Z23 Encounter for immunization: Secondary | ICD-10-CM

## 2013-04-21 DIAGNOSIS — Z01419 Encounter for gynecological examination (general) (routine) without abnormal findings: Secondary | ICD-10-CM | POA: Insufficient documentation

## 2013-04-21 DIAGNOSIS — E785 Hyperlipidemia, unspecified: Secondary | ICD-10-CM

## 2013-04-21 DIAGNOSIS — Z0000 Encounter for general adult medical examination without abnormal findings: Secondary | ICD-10-CM

## 2013-04-21 MED ORDER — TORSEMIDE 20 MG PO TABS: 20.0000 mg | ORAL_TABLET | Freq: Every day | ORAL | Status: DC

## 2013-04-21 MED ORDER — LISINOPRIL 20 MG PO TABS: 20.0000 mg | ORAL_TABLET | Freq: Every day | ORAL | Status: DC

## 2013-04-21 MED ORDER — POTASSIUM CHLORIDE CRYS ER 20 MEQ PO TBCR: 20.0000 meq | EXTENDED_RELEASE_TABLET | Freq: Every day | ORAL | Status: DC

## 2013-04-21 NOTE — Progress Notes (Signed)
Subjective:    Patient ID: Rachael Russell, female    DOB: 1965-09-27, 47 y.o.   MRN: [id number]  HPI Here for health maintenance exam and to review chronic medical problems    Is getting back to normal - after shingles and breast surgery spring/ summer  She ate poorly and did not exercise   Wt is up 3 lb with bmi of 29  Flu vaccine- got one last fall , ? If she wants to get vaccine  Td vaccine 8/10 Mammogram 3/14- had ductectomy- b9 papillomas Thinks she is due in sept actually  Self exam- breasts are tender today - thinks her period is upcoming (feels fatigued and blah and a bit nausea) husb had a vasectomy   pap 9/13 nl --due for yearly  Ascus in the past-f/u ok  Never had a lot of pain from shingles- just a lot of itching    Lipids Lab Results  Component Value Date   CHOL 232* 05/11/2011   CHOL 212* 01/03/2007   Lab Results  Component Value Date   HDL 74 04/14/2013   HDL 59 11/30/8117   HDL 67 14/78/2956   Lab Results  Component Value Date   LDLCALC 142* 04/14/2013   LDLCALC 148* 04/13/2012   LDLCALC 158* 08/07/2011   Lab Results  Component Value Date   TRIG 59 04/14/2013   TRIG 54 04/13/2012   TRIG 55 08/07/2011   Lab Results  Component Value Date   CHOLHDL 3.1 04/14/2013   CHOLHDL 3.5 08/07/2011   CHOLHDL 4 05/11/2011   Lab Results  Component Value Date   LDLDIRECT 149.9 05/11/2011   LDLDIRECT 123.2 01/03/2007    Takes mvi daily  No extra ca or D at this time   Patient Active Problem List   Diagnosis Date Noted  . Herpes zoster 03/07/2013  . 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 Nipple discharge, bloody 10/21/2012  . Routine general medical examination at a health care facility 05/11/2011  . Routine gynecological examination 05/11/2011  . Palpitations 12/19/2010  . Stress reaction, emotional 12/19/2010  . Allergic rhinitis 12/19/2010  . Fibrocystic breast disease 12/19/2010  . LEUKORRHEA 08/01/2010  . GERD 04/14/2010  . PLANTAR FASCIITIS,  LEFT 04/14/2010  . 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 ASCUS PAP 07/12/2009  . SINUSITIS, CHRONIC FRONTAL 04/22/2007  . ANEMIA, IRON DEFICIENCY NOS 01/04/2007  . HYPERLIPIDEMIA 12/21/2006  . METABOLIC SYNDROME X 12/21/2006  . HYPERTENSION 12/21/2006  . EDEMA 12/21/2006   Past Medical History  Diagnosis Date  . Hyperlipidemia   . Hypertension   . Breast mass, right 03/14/08    mammogram and ultrasound  . Wears glasses   . GERD (gastroesophageal reflux disease)    Past Surgical History  Procedure Laterality Date  . Cesarean section  [id number]  . Tsa    . Hernia repair  2006    umb  . Tonsillectomy    . Dilation and curettage of uterus  1998  . Breast lumpectomy with needle localization Left 11/18/2012    Procedure: excise ductal system left breast. subarealar. left partial mastectomy with radiographic guidance;  Surgeon: Ernestene Mention, MD;  Location: [facility];  Service: General;  Laterality: Left;  excise ductal system left breast. subarealar. left partial mastectomy with radiographic guidance  . Breast ductal system excision Left 11/18/2012    Procedure: EXCISION DUCTAL SYSTEM BREAST;  Surgeon: Ernestene Mention, MD;  Location: [facility];  Service: General;  Laterality: Left;  . Breast surgery     History  Substance Use Topics  . Smoking status: Never Smoker   . Smokeless tobacco: Not on file  . Alcohol Use: No   Family History  Problem Relation Age of Onset  . Hypertension Mother   . Arthritis Mother     osteoarthritis  . Breast cancer Mother   . Hypertension Father   . Arthritis Father     osteoarthritis  . Cancer Maternal Grandmother     breast   Allergies  Allergen Reactions  . Ferrous Sulfate     constipation  . Penicillins     REACTION: ? if allergic as child  . Prilosec [Omeprazole Magnesium]     Dizziness    Current Outpatient Prescriptions on File Prior to Visit  Medication Sig Dispense  Refill  . acetaminophen (TYLENOL) 500 MG tablet Take 500 mg by mouth every 6 (six) hours as needed for pain.      . cyclobenzaprine (FLEXERIL) 10 MG tablet Take one half to one by mouth up to 3 times a day as needed for neck pain.       . diphenhydrAMINE (BENADRYL) 25 mg capsule OTC as directed.       Marland Kitchen lisinopril (PRINIVIL,ZESTRIL) 20 MG tablet Take 20 mg by mouth daily.      . potassium chloride SA (KLOR-CON M20) 20 MEQ tablet Take 1 tablet (20 mEq total) by mouth daily.  90 tablet  2  . torsemide (DEMADEX) 20 MG tablet TAKE ONE TABLET BY MOUTH EVERY DAY  90 tablet  0   No current facility-administered medications on file prior to visit.     Review of Systems Review of Systems  Constitutional: Negative for fever, appetite change, fatigue and unexpected weight change.  Eyes: Negative for pain and visual disturbance.  Respiratory: Negative for cough and shortness of breath.   Cardiovascular: Negative for cp or palpitations    Gastrointestinal: Negative for nausea, diarrhea and constipation.  Genitourinary: Negative for urgency and frequency.  Skin: Negative for pallor or rash   Neurological: Negative for weakness, light-headedness, numbness and headaches.  Hematological: Negative for adenopathy. Does not bruise/bleed easily.  Psychiatric/Behavioral: Negative for dysphoric mood. The patient is not nervous/anxious.         Objective:   Physical Exam  Constitutional: She appears well-nourished. No distress.  overwt and well app  HENT:  Head: Normocephalic and atraumatic.  Right Ear: External ear normal.  Left Ear: External ear normal.  Nose: Nose normal.  Mouth/Throat: Oropharynx is clear and moist.  Eyes: Conjunctivae and EOM are normal. Pupils are equal, round, and reactive to light. Right eye exhibits no discharge. Left eye exhibits no discharge. No scleral icterus.  Neck: Normal range of motion. Neck supple. No JVD present. Carotid bruit is not present. No thyromegaly present.   Cardiovascular: Normal rate, regular rhythm, normal heart sounds and intact distal pulses.  Exam reveals no gallop.   Pulmonary/Chest: Effort normal and breath sounds normal. No respiratory distress. She has no wheezes. She has no rales.  Abdominal: Soft. Bowel sounds are normal. She exhibits no distension, no abdominal bruit and no mass. There is no tenderness.  Genitourinary: Vagina normal and uterus normal. There is breast tenderness. No breast swelling, discharge or bleeding. There is no rash, tenderness or lesion on the right labia. There is no rash, tenderness or lesion on the left labia. Uterus is not enlarged and not tender. Cervix exhibits no motion tenderness, no discharge and  no friability. Right adnexum displays no mass, no tenderness and no fullness. Left adnexum displays no mass, no tenderness and no fullness. No bleeding around the vagina. No vaginal discharge found.  Breast exam: No mass, nodules, thickenin, bulging, retraction, inflamation, nipple discharge or skin changes noted.  No axillary or clavicular LA.  Pt has diffuse tenderness of breasts today  Chaperoned exam.    Musculoskeletal: She exhibits no edema and no tenderness.  Lymphadenopathy:    She has no cervical adenopathy.  Neurological: She is alert. She has normal reflexes. No cranial nerve deficit. She exhibits normal muscle tone. Coordination normal.  Skin: Skin is warm and dry. No rash noted. No erythema. No pallor.  Some hyperpigmentation of skin over prev area of shingles on L torso  Psychiatric: She has a normal mood and affect.          Assessment & Plan:

## 2013-04-21 NOTE — Patient Instructions (Addendum)
Pap done today Area of previous shingles looks better  Work on healthy diet and exercise Avoid red meat/ fried foods/ egg yolks/ fatty breakfast meats/ butter, cheese and high fat dairy/ and shellfish   Flu shot today

## 2013-04-23 NOTE — Assessment & Plan Note (Signed)
Exam and pap done  Remote hx of ascus

## 2013-04-23 NOTE — Assessment & Plan Note (Signed)
Reviewed health habits including diet and exercise and skin cancer prevention Also reviewed health mt list, fam hx and immunizations  Wellness labs rev 

## 2013-04-23 NOTE — Assessment & Plan Note (Signed)
bp in fair control at this time  No changes needed  Disc lifstyle change with low sodium diet and exercise  Labs reviewed  

## 2013-04-23 NOTE — Assessment & Plan Note (Signed)
Disc goals for lipids and reasons to control them Rev labs with pt Rev low sat fat diet in detail   

## 2013-05-15 ENCOUNTER — Telehealth: Payer: Self-pay

## 2013-05-15 NOTE — Telephone Encounter (Signed)
Pt's menstrual period started on 04/22/13 as normal period. Period restarted on 05/07/13 and pt is still bleeding with heavy flow;dime sized clots on and off. No pain, cramping or breast tenderness. Pt said 1st time ever had early period.Please advise. [facility] [place]. Pt request cb.

## 2013-05-15 NOTE — Telephone Encounter (Signed)
Pt notified of Dr. Tower's comments  

## 2013-05-15 NOTE — Telephone Encounter (Signed)
This may become more common for her as the is in the perimenopausal timeframe now - often periods get more erratic as you near 64 (avg age for menopause) - if she develops other symptoms or things worsen please follow up

## 2013-06-22 ENCOUNTER — Ambulatory Visit: Payer: 59 | Admitting: Family Medicine

## 2013-06-22 ENCOUNTER — Encounter: Payer: Self-pay | Admitting: Family Medicine

## 2013-06-22 ENCOUNTER — Ambulatory Visit (INDEPENDENT_AMBULATORY_CARE_PROVIDER_SITE_OTHER)
Admission: RE | Admit: 2013-06-22 | Discharge: 2013-06-22 | Disposition: A | Discharge status: Home / Self Care | Payer: 59 | Source: Ambulatory Visit | Attending: Family Medicine | Admitting: Family Medicine

## 2013-06-22 ENCOUNTER — Ambulatory Visit (INDEPENDENT_AMBULATORY_CARE_PROVIDER_SITE_OTHER): Payer: 59 | Admitting: Family Medicine

## 2013-06-22 VITALS — BP 126/80 | HR 81 | Temp 98.3°F | Ht 65.25 in | Wt 186.2 lb

## 2013-06-22 DIAGNOSIS — S6990XA Unspecified injury of unspecified wrist, hand and finger(s), initial encounter: Secondary | ICD-10-CM

## 2013-06-22 DIAGNOSIS — S6992XA Unspecified injury of left wrist, hand and finger(s), initial encounter: Secondary | ICD-10-CM

## 2013-06-22 DIAGNOSIS — S59909A Unspecified injury of unspecified elbow, initial encounter: Secondary | ICD-10-CM

## 2013-06-22 NOTE — Patient Instructions (Signed)
Fu 2 weeks
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
## 2013-07-06 ENCOUNTER — Ambulatory Visit (INDEPENDENT_AMBULATORY_CARE_PROVIDER_SITE_OTHER)
Admission: RE | Admit: 2013-07-06 | Discharge: 2013-07-06 | Disposition: A | Discharge status: Home / Self Care | Payer: 59 | Source: Ambulatory Visit | Attending: Family Medicine | Admitting: Family Medicine

## 2013-07-06 ENCOUNTER — Ambulatory Visit (INDEPENDENT_AMBULATORY_CARE_PROVIDER_SITE_OTHER): Payer: 59 | Admitting: Family Medicine

## 2013-07-06 VITALS — BP 130/80 | HR 87 | Temp 97.8°F | Ht 65.25 in | Wt 185.8 lb

## 2013-07-06 DIAGNOSIS — M25539 Pain in unspecified wrist: Secondary | ICD-10-CM

## 2013-07-06 DIAGNOSIS — M25532 Pain in left wrist: Secondary | ICD-10-CM
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
## 2013-07-06 NOTE — Progress Notes (Signed)
Pre-visit discussion using our clinic review tool. No additional management support is needed unless otherwise documented below in the visit note.  

## 2013-07-06 NOTE — Patient Instructions (Signed)
F/u 4 weeks with Dr. Patsy Lager

## 2013-07-07 ENCOUNTER — Encounter: Payer: Self-pay | Admitting: Family Medicine

## 2013-08-11 ENCOUNTER — Encounter: Payer: Self-pay | Admitting: Family Medicine

## 2013-08-11 ENCOUNTER — Ambulatory Visit (INDEPENDENT_AMBULATORY_CARE_PROVIDER_SITE_OTHER): Payer: 59 | Admitting: Family Medicine

## 2013-08-11 ENCOUNTER — Telehealth: Payer: Self-pay

## 2013-08-11 VITALS — BP 138/90 | HR 96 | Temp 98.6°F | Wt 186.0 lb

## 2013-08-11 DIAGNOSIS — J069 Acute upper respiratory infection, unspecified: Secondary | ICD-10-CM

## 2013-08-11 MED ORDER — BENZONATATE 200 MG PO CAPS: 200.0000 mg | ORAL_CAPSULE | Freq: Three times a day (TID) | ORAL | Status: DC | PRN

## 2013-08-11 MED ORDER — GUAIFENESIN-CODEINE 100-10 MG/5ML PO SYRP: 5.0000 mL | ORAL_SOLUTION | Freq: Three times a day (TID) | ORAL | Status: DC | PRN

## 2013-08-11 NOTE — Patient Instructions (Signed)
Tessalon for cough.  Rest your voice.  Drink plenty of fluids, take tylenol as needed, and gargle with warm salt water for your throat.  This should gradually improve.  Take care.  Let us know if you have other concerns.

## 2013-08-11 NOTE — Telephone Encounter (Signed)
Please call in cheratussin.  Sedation caution.  Thanks.

## 2013-08-11 NOTE — Telephone Encounter (Signed)
Medication phoned to pharmacy. Left detailed message on pt's voicemail.

## 2013-08-11 NOTE — Telephone Encounter (Signed)
[facility] left v/m that tessalon is on manufacturer back order and request substitute med to [facility].

## 2013-08-11 NOTE — Progress Notes (Signed)
Pre-visit discussion using our clinic review tool. No additional management support is needed unless otherwise documented below in the visit note.  Cough for about 2 weeks, dry cough initially.  Hoarse, since last night.  ST since yesterday.  Yesterday with temporarily discolored sputum.  No fevers.  No wheeze.  No ear pain, unless she is coughing.  Talking triggers the cough.  No facial pain.  Some rhinorrhea. No rash, no vomiting.  Some diarrhea yesterday.   Meds, vitals, and allergies reviewed.   ROS: See HPI.  Otherwise, noncontributory.  GEN: nad, alert and oriented, voice hoarse HEENT: mucous membranes moist, tm w/o erythema, nasal exam w/o erythema, clear discharge noted,  OP with cobblestoning NECK: supple w/o LA, no stridor CV: rrr.   PULM: ctab, no inc wob EXT: no edema SKIN: no acute rash

## 2013-08-11 NOTE — Telephone Encounter (Signed)
Pt seen today [facility] does not have tessalon and pt request substitute liquid med sent to [facility]; pt does not want pill due to pt having problems swallowing.pt request cb.

## 2013-08-12 DIAGNOSIS — J069 Acute upper respiratory infection, unspecified: Secondary | ICD-10-CM | POA: Insufficient documentation

## 2013-08-12 NOTE — Assessment & Plan Note (Signed)
Nontoxic, likely viral, see notes re: cough medicine, f/u prn.  Supportive care. D/w pt re: ddx.

## 2013-08-14 ENCOUNTER — Ambulatory Visit: Payer: 59 | Admitting: Family Medicine

## 2013-08-14 NOTE — Telephone Encounter (Signed)
If she is that much worse -needs to be re checked - please sched appt with [facility] this week

## 2013-08-14 NOTE — Telephone Encounter (Signed)
Left voicemail letting pt know Dr. Milinda Antis wanted her to come back in for a recheck of her sxs

## 2013-08-14 NOTE — Telephone Encounter (Addendum)
Pt has taking cheritussin as directed with no relief; last night took extra dose of cheritussin and delsym and still no relief from prod cough with yellow phlegm; pt coughs so much almost vomits.Throat is scratchy but not sore. No fever. Pt is still out of work.Please advise. [facility]. Pt request note sent to Dr Milinda Antis.

## 2013-08-15 ENCOUNTER — Ambulatory Visit: Payer: 59 | Admitting: Family Medicine

## 2013-08-15 NOTE — Telephone Encounter (Signed)
Pt scheduled f/u for 08/16/13

## 2013-08-16 ENCOUNTER — Encounter: Payer: Self-pay | Admitting: Family Medicine

## 2013-08-16 ENCOUNTER — Ambulatory Visit (INDEPENDENT_AMBULATORY_CARE_PROVIDER_SITE_OTHER): Payer: 59 | Admitting: Family Medicine

## 2013-08-16 VITALS — BP 130/86 | HR 83 | Temp 98.4°F | Ht 65.25 in | Wt 186.5 lb

## 2013-08-16 DIAGNOSIS — J069 Acute upper respiratory infection, unspecified: Secondary | ICD-10-CM

## 2013-08-16 MED ORDER — HYDROCOD POLST-CHLORPHEN POLST 10-8 MG/5ML PO LQCR: 5.0000 mL | Freq: Two times a day (BID) | ORAL | Status: DC | PRN

## 2013-08-16 MED ORDER — AZITHROMYCIN 250 MG PO TABS: ORAL_TABLET | ORAL | Status: DC

## 2013-08-16 NOTE — Progress Notes (Signed)
Pre-visit discussion using our clinic review tool. No additional management support is needed unless otherwise documented below in the visit note.  

## 2013-08-16 NOTE — Progress Notes (Signed)
Subjective:    Patient ID: Rachael Russell, female    DOB: 1966/02/06, 47 y.o.   MRN: [id number]  HPI Here for f/u  Was seen on 12/19 - after 2 weeks of cough  Given tessalon- that was on back order so she had to guifen- codeine - makes her worse instead of better  Had lost her voice   Throat is not as sore  Voice is improved  Cough is very bad-night and day - - prod of yellow mucous -hard to get it out   ? Wheezing  No fever   Went back to delsym otc last night - and it helped   Patient Active Problem List   Diagnosis Date Noted  . URI (upper respiratory infection) 08/12/2013  . Herpes zoster 03/07/2013  . 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 Nipple discharge, bloody 10/21/2012  . Routine general medical examination at a health care facility 05/11/2011  . Routine gynecological examination 05/11/2011  . Palpitations 12/19/2010  . Stress reaction, emotional 12/19/2010  . Allergic rhinitis 12/19/2010  . Fibrocystic breast disease 12/19/2010  . LEUKORRHEA 08/01/2010  . GERD 04/14/2010  . PLANTAR FASCIITIS, LEFT 04/14/2010  . 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 ASCUS PAP 07/12/2009  . SINUSITIS, CHRONIC FRONTAL 04/22/2007  . ANEMIA, IRON DEFICIENCY NOS 01/04/2007  . HYPERLIPIDEMIA 12/21/2006  . METABOLIC SYNDROME X 12/21/2006  . HYPERTENSION 12/21/2006  . EDEMA 12/21/2006   Past Medical History  Diagnosis Date  . Hyperlipidemia   . Hypertension   . Breast mass, right 03/14/08    mammogram and ultrasound  . Wears glasses   . GERD (gastroesophageal reflux disease)    Past Surgical History  Procedure Laterality Date  . Cesarean section  [id number]  . Tsa    . Hernia repair  2006    umb  . Tonsillectomy    . Dilation and curettage of uterus  1998  . Breast lumpectomy with needle localization Left 11/18/2012    Procedure: excise ductal system left breast. subarealar. left partial mastectomy with radiographic guidance;  Surgeon:  Ernestene Mention, MD;  Location: [facility];  Service: General;  Laterality: Left;  excise ductal system left breast. subarealar. left partial mastectomy with radiographic guidance  . Breast ductal system excision Left 11/18/2012    Procedure: EXCISION DUCTAL SYSTEM BREAST;  Surgeon: Ernestene Mention, MD;  Location: [facility];  Service: General;  Laterality: Left;  . Breast surgery     History  Substance Use Topics  . Smoking status: Never Smoker   . Smokeless tobacco: Never Used  . Alcohol Use: No   Family History  Problem Relation Age of Onset  . Hypertension Mother   . Arthritis Mother     osteoarthritis  . Breast cancer Mother   . Hypertension Father   . Arthritis Father     osteoarthritis  . Cancer Maternal Grandmother     breast   Allergies  Allergen Reactions  . Ferrous Sulfate     constipation  . Penicillins     REACTION: ? if allergic as child  . Prilosec [Omeprazole Magnesium]     Dizziness    Current Outpatient Prescriptions on File Prior to Visit  Medication Sig Dispense Refill  . diphenhydrAMINE (BENADRYL) 25 mg capsule OTC as needed.      Marland Kitchen guaiFENesin-codeine (CHERATUSSIN AC) 100-10 MG/5ML syrup Take 5 mLs by mouth 3 (three) times daily as needed for cough.  120 mL  0  . lisinopril (PRINIVIL,ZESTRIL) 20 MG tablet Take 1 tablet (20 mg total) by mouth daily.  90 tablet  3  . potassium chloride SA (KLOR-CON M20) 20 MEQ tablet Take 1 tablet (20 mEq total) by mouth daily.  90 tablet  3  . torsemide (DEMADEX) 20 MG tablet Take 1 tablet (20 mg total) by mouth daily.  90 tablet  3   No current facility-administered medications on file prior to visit.    Review of Systems Review of Systems  Constitutional: Negative for fever, appetite change, fatigue and unexpected weight change.  ENT no congestion or rhinorrhea or facial pain  Eyes: Negative for pain and visual disturbance.  Respiratory: Negative for shortness of breath.     Cardiovascular: Negative for cp or palpitations    Gastrointestinal: Negative for nausea, diarrhea and constipation.  Genitourinary: Negative for urgency and frequency.  Skin: Negative for pallor or rash   Neurological: Negative for weakness, light-headedness, numbness and headaches.  Hematological: Negative for adenopathy. Does not bruise/bleed easily.  Psychiatric/Behavioral: Negative for dysphoric mood. The patient is not nervous/anxious.         Objective:   Physical Exam  Constitutional: She appears well-developed and well-nourished. No distress.  HENT:  Head: Normocephalic and atraumatic.  Right Ear: External ear normal.  Left Ear: External ear normal.  Mouth/Throat: Oropharynx is clear and moist.  Boggy nares   Eyes: Conjunctivae and EOM are normal. Pupils are equal, round, and reactive to light. Right eye exhibits no discharge. Left eye exhibits no discharge.  Neck: Normal range of motion. Neck supple. No JVD present. No thyromegaly present.  Cardiovascular: Normal rate and regular rhythm.   Pulmonary/Chest: Effort normal and breath sounds normal. No respiratory distress. She has no wheezes. She has no rales. She exhibits no tenderness.  Harsh bs occ rhonchi No rales    Lymphadenopathy:    She has no cervical adenopathy.  Neurological: She is alert.  Skin: Skin is warm and dry. No rash noted.  Psychiatric: She has a normal mood and affect.          Assessment & Plan:

## 2013-08-16 NOTE — Patient Instructions (Signed)
Drink lots of fluids and rest  Try tussionex for cough- it will sedate you but it is significantly stronger for cough  If not improved in several days or if fever or worse- fill the zithromax and take it as directed  Update if not starting to improve in a week or if worsening

## 2013-08-17 NOTE — Assessment & Plan Note (Signed)
Still sounds viral -but if symptoms worsen she will fill px for zpack tussionex for cough that is not well controlled with prev agent  Disc symptomatic care - see instructions on AVS  Update if not starting to improve in a week or if worsening

## 2013-09-02 ENCOUNTER — Ambulatory Visit: Payer: Self-pay | Admitting: Physician Assistant

## 2013-09-15 ENCOUNTER — Ambulatory Visit: Payer: 59 | Admitting: Family Medicine

## 2013-09-15 ENCOUNTER — Ambulatory Visit (INDEPENDENT_AMBULATORY_CARE_PROVIDER_SITE_OTHER): Payer: 59 | Admitting: Family Medicine

## 2013-09-15 VITALS — BP 160/110 | HR 104 | Temp 98.3°F | Ht 65.25 in | Wt 190.5 lb

## 2013-09-15 DIAGNOSIS — I10 Essential (primary) hypertension: Secondary | ICD-10-CM

## 2013-09-15 DIAGNOSIS — M5412 Radiculopathy, cervical region: Secondary | ICD-10-CM

## 2013-09-15 DIAGNOSIS — M5010 Cervical disc disorder with radiculopathy, unspecified cervical region: Secondary | ICD-10-CM

## 2013-09-15 MED ORDER — DICLOFENAC SODIUM 75 MG PO TBEC
75.0000 mg | DELAYED_RELEASE_TABLET | Freq: Two times a day (BID) | ORAL | Status: DC
Start: 2013-09-15 — End: 2013-09-26

## 2013-09-15 MED ORDER — CYCLOBENZAPRINE HCL 10 MG PO TABS: 10.0000 mg | ORAL_TABLET | Freq: Every evening | ORAL | Status: DC | PRN

## 2013-09-15 NOTE — Assessment & Plan Note (Signed)
Start NSAID, continue muscle relaxant and use percocet for breakthrough pain. Offered cervical collar. Limit neck motion. Follow up in 2 weeks with PCP.
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
## 2013-09-15 NOTE — Assessment & Plan Note (Signed)
Poor control likely at least partially due to pain. Increase lisinopril to 40 mg daly. Follow BPs.   Follow up with PCP in 2 weeks for BP check as well as creatinine check on higher dose ACEI

## 2013-09-15 NOTE — Progress Notes (Signed)
Pre-visit discussion using our clinic review tool. No additional management support is needed unless otherwise documented below in the visit note.  

## 2013-09-15 NOTE — Patient Instructions (Addendum)
Increase lisinopril to 2 tabs daily ( 40 mg daily total) Follow your BP at home.. Call if remaining greater than 140/90 on higher dose of medication.  Use cyclobenzaprine for muscle spasm. Percocet ( oxycodone/ aceto) for breakthrough pain. Diclofenac twice daily for pain and inflammation. Limit rotation of neck and use of arms with lifting. Follow up PCP in 2 weeks.

## 2013-09-26 ENCOUNTER — Encounter: Payer: Self-pay | Admitting: Family Medicine

## 2013-09-26 ENCOUNTER — Ambulatory Visit (INDEPENDENT_AMBULATORY_CARE_PROVIDER_SITE_OTHER): Payer: 59 | Admitting: Family Medicine

## 2013-09-26 VITALS — BP 150/96 | HR 89 | Temp 97.9°F | Ht 65.25 in | Wt 188.5 lb

## 2013-09-26 DIAGNOSIS — I10 Essential (primary) hypertension: Secondary | ICD-10-CM

## 2013-09-26 DIAGNOSIS — M79609 Pain in unspecified limb: Secondary | ICD-10-CM

## 2013-09-26 DIAGNOSIS — M79601 Pain in right arm: Secondary | ICD-10-CM | POA: Insufficient documentation

## 2013-09-26 MED ORDER — CYCLOBENZAPRINE HCL 10 MG PO TABS: 10.0000 mg | ORAL_TABLET | Freq: Every evening | ORAL | Status: DC | PRN

## 2013-09-26 MED ORDER — AMLODIPINE BESYLATE 5 MG PO TABS: 5.0000 mg | ORAL_TABLET | Freq: Every day | ORAL | Status: DC

## 2013-09-26 NOTE — Patient Instructions (Signed)
Work on [employer] and exercise the best that you can Start amlodipine 5 mg once daily Continue other medicines  Stop up front for orthopedic referral    DASH Diet The DASH diet stands for "Dietary Approaches to Stop Hypertension." It is a healthy eating plan that has been shown to reduce high blood pressure (hypertension) in as little as 14 days, while also possibly providing other significant health benefits. These other health benefits include reducing the risk of breast cancer after menopause and reducing the risk of type 2 diabetes, heart disease, colon cancer, and stroke. Health benefits also include weight loss and slowing kidney failure in patients with chronic kidney disease.  DIET GUIDELINES  Limit salt (sodium). Your diet should contain less than 1500 mg of sodium daily.  Limit refined or processed carbohydrates. Your diet should include mostly whole grains. Desserts and added sugars should be used sparingly.  Include small amounts of heart-healthy fats. These types of fats include nuts, oils, and tub margarine. Limit saturated and trans fats. These fats have been shown to be harmful in the body. CHOOSING FOODS  The following food groups are based on a 2000 calorie diet. See your Registered Dietitian for individual calorie needs. Grains and Grain Products (6 to 8 servings daily)  Eat More Often: Whole-wheat bread, brown rice, whole-grain or wheat pasta, quinoa, popcorn without added fat or salt (air popped).  Eat Less Often: White bread, white pasta, white rice, cornbread. Vegetables (4 to 5 servings daily)  Eat More Often: Fresh, frozen, and canned vegetables. Vegetables may be raw, steamed, roasted, or grilled with a minimal amount of fat.  Eat Less Often/Avoid: Creamed or fried vegetables. Vegetables in a cheese sauce. Fruit (4 to 5 servings daily)  Eat More Often: All fresh, canned (in natural juice), or frozen fruits. Dried fruits without added sugar. One hundred  percent fruit juice ( cup [237 mL] daily).  Eat Less Often: Dried fruits with added sugar. Canned fruit in light or heavy syrup. [employer], Fish, and Poultry (2 servings or less daily. One serving is 3 to 4 oz [85-114 g]).  Eat More Often: Ninety percent or leaner ground beef, tenderloin, sirloin. Round cuts of beef, chicken breast, [place] breast. All fish. Grill, bake, or broil your meat. Nothing should be fried.  Eat Less Often/Avoid: Fatty cuts of meat, [place], or chicken leg, thigh, or wing. Fried cuts of meat or fish. Dairy (2 to 3 servings)  Eat More Often: Low-fat or fat-free milk, low-fat plain or light yogurt, reduced-fat or part-skim cheese.  Eat Less Often/Avoid: Milk (whole, 2%).Whole milk yogurt. Full-fat cheeses. Nuts, Seeds, and Legumes (4 to 5 servings per week)  Eat More Often: All without added salt.  Eat Less Often/Avoid: Salted nuts and seeds, canned beans with added salt. Fats and Sweets (limited)  Eat More Often: Vegetable oils, tub margarines without trans fats, sugar-free gelatin. Mayonnaise and salad dressings.  Eat Less Often/Avoid: Coconut oils, palm oils, butter, stick margarine, cream, half and half, cookies, candy, pie. FOR MORE INFORMATION The Dash Diet Eating Plan: www.dashdiet.org Document Released: 07/30/2011 Document Revised: 11/02/2011 Document Reviewed: 07/30/2011 [facility] Patient Information 2014 [place], [place].

## 2013-09-26 NOTE — Progress Notes (Signed)
Pre-visit discussion using our clinic review tool. No additional management support is needed unless otherwise documented below in the visit note.  

## 2013-09-26 NOTE — Progress Notes (Signed)
Subjective:    Patient ID: Rachael Russell, female    DOB: 1966/02/18, 48 y.o.   MRN: [id number]  HPI Here for f/u of HTN   At home 150s- / 110s Woke up one day with a headache -was during menses   Lisinopril 40 and demadex  Does not really feels stressed  Some weight gain -though not eating different  She ate a bit more fast food lately - due to son's ball games     Cervical/ neck problem Her arm pain is still there (though neck and shoulder area are better)  Still some pain in wrist R and hand - feels touchy / yesterday her elbow hurt Out of the narcotic pain med she got in the ER Out of muscle relaxer  Patient Active Problem List   Diagnosis Date Noted  . Right arm pain 09/26/2013  . Cervical disc disorder with radiculopathy of cervical region 09/15/2013  . Viral URI with cough 08/12/2013  . Herpes zoster 03/07/2013  . 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 Nipple discharge, bloody 10/21/2012  . Routine general medical examination at a health care facility 05/11/2011  . Routine gynecological examination 05/11/2011  . Palpitations 12/19/2010  . Stress reaction, emotional 12/19/2010  . Allergic rhinitis 12/19/2010  . Fibrocystic breast disease 12/19/2010  . LEUKORRHEA 08/01/2010  . GERD 04/14/2010  . PLANTAR FASCIITIS, LEFT 04/14/2010  . 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 ASCUS PAP 07/12/2009  . SINUSITIS, CHRONIC FRONTAL 04/22/2007  . ANEMIA, IRON DEFICIENCY NOS 01/04/2007  . HYPERLIPIDEMIA 12/21/2006  . METABOLIC SYNDROME X 06/17/8526  . HYPERTENSION 12/21/2006  . EDEMA 12/21/2006   Past Medical History  Diagnosis Date  . Hyperlipidemia   . Hypertension   . Breast mass, right 03/14/08    mammogram and ultrasound  . Wears glasses   . GERD (gastroesophageal reflux disease)    Past Surgical History  Procedure Laterality Date  . Cesarean section  [id number]  . Tsa    . Hernia repair  2006    umb  . Tonsillectomy    .  Dilation and curettage of uterus  1998  . Breast lumpectomy with needle localization Left 11/18/2012    Procedure: excise ductal system left breast. subarealar. left partial mastectomy with radiographic guidance;  Surgeon: [name], MD;  Location: [place];  Service: General;  Laterality: Left;  excise ductal system left breast. subarealar. left partial mastectomy with radiographic guidance  . Breast ductal system excision Left 11/18/2012    Procedure: EXCISION DUCTAL SYSTEM BREAST;  Surgeon: [name], MD;  Location: [place];  Service: General;  Laterality: Left;  . Breast surgery     History  Substance Use Topics  . Smoking status: Never Smoker   . Smokeless tobacco: Never Used  . Alcohol Use: No   Family History  Problem Relation Age of Onset  . Hypertension Mother   . Arthritis Mother     osteoarthritis  . Breast cancer Mother   . Hypertension Father   . Arthritis Father     osteoarthritis  . Cancer Maternal Grandmother     breast   Allergies  Allergen Reactions  . Ferrous Sulfate     constipation  . Penicillins     REACTION: ? if allergic as child  . Prilosec [Omeprazole Magnesium]     Dizziness    Current Outpatient Prescriptions on File Prior to Visit  Medication Sig Dispense Refill  .  diphenhydrAMINE (BENADRYL) 25 mg capsule OTC as needed.      . potassium chloride SA (KLOR-CON M20) 20 MEQ tablet Take 1 tablet (20 mEq total) by mouth daily.  90 tablet  3  . torsemide (DEMADEX) 20 MG tablet Take 1 tablet (20 mg total) by mouth daily.  90 tablet  3   No current facility-administered medications on file prior to visit.    Review of Systems Review of Systems  Constitutional: Negative for fever, appetite change, fatigue and unexpected weight change.  Eyes: Negative for pain and visual disturbance.  Respiratory: Negative for cough and shortness of breath.   Cardiovascular: Negative for cp or palpitations      Gastrointestinal: Negative for nausea, diarrhea and constipation.  Genitourinary: Negative for urgency and frequency.  Skin: Negative for pallor or rash   Neurological: Negative for weakness, light-headedness, numbness and headaches. (no headache today) MSK pos for wrist and hand pain on the Right / pos for neck pain intermittently Hematological: Negative for adenopathy. Does not bruise/bleed easily.  Psychiatric/Behavioral: Negative for dysphoric mood. The patient is not nervous/anxious.         Objective:   Physical Exam  Constitutional: She appears well-developed and well-nourished. No distress.  overwt and well app  HENT:  Head: Normocephalic and atraumatic.  Mouth/Throat: Oropharynx is clear and moist.  Eyes: Conjunctivae and EOM are normal. Right eye exhibits no discharge. Left eye exhibits no discharge. No scleral icterus.  Neck: Normal range of motion. Neck supple. No JVD present. Carotid bruit is not present. No thyromegaly present.  Cardiovascular: Normal rate, regular rhythm, normal heart sounds and intact distal pulses.  Exam reveals no gallop.   Pulmonary/Chest: Effort normal and breath sounds normal. No respiratory distress. She has no wheezes. She has no rales.  Abdominal: Soft. Bowel sounds are normal. She exhibits no distension, no abdominal bruit and no mass. There is no tenderness.  Musculoskeletal: She exhibits tenderness. She exhibits no edema.  Some tenderness over medial wrist and also medial epicondyle No cervical tenderness today Full rom of R upper ext  Lymphadenopathy:    She has no cervical adenopathy.  Neurological: She is alert. She has normal reflexes. No cranial nerve deficit. She exhibits normal muscle tone. Coordination normal.  Skin: Skin is warm and dry. No rash noted. No erythema. No pallor.  Psychiatric: She has a normal mood and affect.          Assessment & Plan:

## 2013-09-27 ENCOUNTER — Telehealth: Payer: Self-pay | Admitting: Family Medicine

## 2013-09-27 LAB — CBC WITH DIFFERENTIAL/PLATELET
Basophils Absolute: 0 10*3/uL (ref 0.0–0.2)
Basos: 0 %
Eos: 2 %
Eosinophils Absolute: 0.1 10*3/uL (ref 0.0–0.4)
HCT: 35.5 % (ref 34.0–46.6)
Hemoglobin: 11.8 g/dL (ref 11.1–15.9)
Immature Grans (Abs): 0 10*3/uL (ref 0.0–0.1)
Immature Granulocytes: 0 %
Lymphocytes Absolute: 1.9 10*3/uL (ref 0.7–3.1)
Lymphs: 26 %
MCH: 28.3 pg (ref 26.6–33.0)
MCHC: 33.2 g/dL (ref 31.5–35.7)
MCV: 85 fL (ref 79–97)
Monocytes Absolute: 0.5 10*3/uL (ref 0.1–0.9)
Monocytes: 6 %
Neutrophils Absolute: 4.9 10*3/uL (ref 1.4–7.0)
Neutrophils Relative %: 66 %
RBC: 4.17 x10E6/uL (ref 3.77–5.28)
RDW: 14.7 % (ref 12.3–15.4)
WBC: 7.5 10*3/uL (ref 3.4–10.8)

## 2013-09-27 LAB — TSH: TSH: 1.35 u[IU]/mL (ref 0.450–4.500)

## 2013-09-27 LAB — COMPREHENSIVE METABOLIC PANEL
ALT: 21 IU/L (ref 0–32)
AST: 21 IU/L (ref 0–40)
Albumin/Globulin Ratio: 1.5 (ref 1.1–2.5)
Albumin: 4 g/dL (ref 3.5–5.5)
Alkaline Phosphatase: 68 IU/L (ref 39–117)
BUN/Creatinine Ratio: 16 (ref 9–23)
BUN: 14 mg/dL (ref 6–24)
CO2: 24 mmol/L (ref 18–29)
Calcium: 9 mg/dL (ref 8.7–10.2)
Chloride: 103 mmol/L (ref 97–108)
Creatinine, Ser: 0.87 mg/dL (ref 0.57–1.00)
GFR calc Af Amer: 91 mL/min/{1.73_m2} (ref >59)
GFR calc non Af Amer: 79 mL/min/{1.73_m2} (ref >59)
Globulin, Total: 2.7 g/dL (ref 1.5–4.5)
Glucose: 89 mg/dL (ref 65–99)
Potassium: 4.2 mmol/L (ref 3.5–5.2)
Sodium: 141 mmol/L (ref 134–144)
Total Bilirubin: <0.2 mg/dL (ref 0.0–1.2)
Total Protein: 6.7 g/dL (ref 6.0–8.5)

## 2013-09-27 NOTE — Telephone Encounter (Signed)
Relevant patient education assigned to patient using Emmi. ° °

## 2013-09-27 NOTE — Assessment & Plan Note (Signed)
Arm/ wrist - with some tenderness/ no swelling  Her neck symptoms are better  Ref to orthopedic for further eval ? Radiculopathy- but now tender in extremity also - atypical

## 2013-09-27 NOTE — Assessment & Plan Note (Signed)
BP: 150/96 mmHg  bp is not in  control at this time  No changes needed Disc lifstyle change with low sodium diet and exercise  - given copy of DASH diet Lab today - did inc ace  Also add amlodipine 5 mg daily  F/u in 2-3 weeks

## 2013-09-29 ENCOUNTER — Ambulatory Visit: Payer: 59 | Admitting: Family Medicine

## 2013-10-09 ENCOUNTER — Telehealth: Payer: Self-pay | Admitting: Family Medicine

## 2013-10-09 MED ORDER — LISINOPRIL 40 MG PO TABS: 40.0000 mg | ORAL_TABLET | Freq: Every day | ORAL | Status: DC

## 2013-10-09 NOTE — Telephone Encounter (Signed)
Pt has appointment with Dr. Glori Bickers and Friday and says because Dr. Glori Bickers went up on  her dosage for her Lisinopril she only has one pill left and will need a refill. Pt uses [facility] in [place] and wanted to get in before the weather got bad. Please advise.

## 2013-10-09 NOTE — Telephone Encounter (Signed)
Rx sent and pt notified.

## 2013-10-10 ENCOUNTER — Ambulatory Visit: Payer: 59 | Admitting: Family Medicine

## 2013-10-13 ENCOUNTER — Ambulatory Visit (INDEPENDENT_AMBULATORY_CARE_PROVIDER_SITE_OTHER): Payer: 59 | Admitting: Family Medicine

## 2013-10-13 ENCOUNTER — Encounter: Payer: Self-pay | Admitting: Family Medicine

## 2013-10-13 VITALS — BP 132/85 | HR 83 | Temp 98.3°F | Ht 65.25 in | Wt 184.5 lb

## 2013-10-13 DIAGNOSIS — K644 Residual hemorrhoidal skin tags: Secondary | ICD-10-CM

## 2013-10-13 DIAGNOSIS — I10 Essential (primary) hypertension: Secondary | ICD-10-CM

## 2013-10-13 MED ORDER — TORSEMIDE 20 MG PO TABS: 20.0000 mg | ORAL_TABLET | Freq: Every day | ORAL | Status: DC

## 2013-10-13 MED ORDER — AMLODIPINE BESYLATE 5 MG PO TABS: 5.0000 mg | ORAL_TABLET | Freq: Every day | ORAL | Status: DC

## 2013-10-13 NOTE — Patient Instructions (Signed)
Blood pressure looks good- I think you will continue to improve  Keep up the great job with diet/ exercise and weight loss  Keep using prep H for hemorrhoids - avoid constipation -if worse please let me know

## 2013-10-13 NOTE — Progress Notes (Signed)
Subjective:    Patient ID: Rachael Russell, female    DOB: Jul 11, 1966, 48 y.o.   MRN: [id number]  HPI Here for f/u of HTN   Wt is down 4 lb with bmi of 30  Using elliptical machine at least 2 times per week and floor work Gave up fast food and sodas - getting used to that   A little fatigued - on menses   BP Readings from Last 3 Encounters:  10/13/13 124/84  09/26/13 150/96  09/15/13 160/110   much improved today   At home - lowest bp was 133/94 - has become gradually better  At times diastolic is still high   Office Visit on 09/26/2013  Component Date Value Ref Range Status  . TSH 09/26/2013 1.350  0.450 - 4.500 uIU/mL Final  . WBC 09/26/2013 7.5  3.4 - 10.8 x10E3/uL Final  . RBC 09/26/2013 4.17  3.77 - 5.28 x10E6/uL Final  . Hemoglobin 09/26/2013 11.8  11.1 - 15.9 g/dL Final  . HCT 09/26/2013 35.5  34.0 - 46.6 % Final  . MCV 09/26/2013 85  79 - 97 fL Final  . MCH 09/26/2013 28.3  26.6 - 33.0 pg Final  . MCHC 09/26/2013 33.2  31.5 - 35.7 g/dL Final  . RDW 09/26/2013 14.7  12.3 - 15.4 % Final  . Neutrophils Relative % 09/26/2013 66   Final  . Lymphs 09/26/2013 26   Final  . Monocytes 09/26/2013 6   Final  . Eos 09/26/2013 2   Final  . Basos 09/26/2013 0   Final  . Neutrophils Absolute 09/26/2013 4.9  1.4 - 7.0 x10E3/uL Final  . Lymphocytes Absolute 09/26/2013 1.9  0.7 - 3.1 x10E3/uL Final  . Monocytes Absolute 09/26/2013 0.5  0.1 - 0.9 x10E3/uL Final  . Eosinophils Absolute 09/26/2013 0.1  0.0 - 0.4 x10E3/uL Final  . Basophils Absolute 09/26/2013 0.0  0.0 - 0.2 x10E3/uL Final  . Immature Granulocytes 09/26/2013 0   Final  . Immature Grans (Abs) 09/26/2013 0.0  0.0 - 0.1 x10E3/uL Final  . Glucose 09/26/2013 89  65 - 99 mg/dL Final  . BUN 09/26/2013 14  6 - 24 mg/dL Final  . Creatinine, Ser 09/26/2013 0.87  0.57 - 1.00 mg/dL Final  . GFR calc non Af Amer 09/26/2013 79  >59 mL/min/1.73 Final  . GFR calc Af Amer 09/26/2013 91  >59 mL/min/1.73 Final  . BUN/Creatinine  Ratio 09/26/2013 16  9 - 23 Final  . Sodium 09/26/2013 141  134 - 144 mmol/L Final  . Potassium 09/26/2013 4.2  3.5 - 5.2 mmol/L Final  . Chloride 09/26/2013 103  97 - 108 mmol/L Final  . CO2 09/26/2013 24  18 - 29 mmol/L Final  . Calcium 09/26/2013 9.0  8.7 - 10.2 mg/dL Final  . Total Protein 09/26/2013 6.7  6.0 - 8.5 g/dL Final  . Albumin 09/26/2013 4.0  3.5 - 5.5 g/dL Final  . Globulin, Total 09/26/2013 2.7  1.5 - 4.5 g/dL Final  . Albumin/Globulin Ratio 09/26/2013 1.5  1.1 - 2.5 Final  . Total Bilirubin 09/26/2013 <0.2  0.0 - 1.2 mg/dL Final  . Alkaline Phosphatase 09/26/2013 68  39 - 117 IU/L Final  . AST 09/26/2013 21  0 - 40 IU/L Final  . ALT 09/26/2013 21  0 - 32 IU/L Final     Mentions problems with ext hemorrhoids  Uses prep H No bleeding   Has some numbness over R arm- has hx of radiculopathy -neck  It is  doing ok overall - took nsaid and wants to wait before seeing orthopedics   Patient Active Problem List   Diagnosis Date Noted  . Hemorrhoids, external 10/13/2013  . Right arm pain 09/26/2013  . Cervical disc disorder with radiculopathy of cervical region 09/15/2013  . Herpes zoster 03/07/2013  . 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 Nipple discharge, bloody 10/21/2012  . Routine general medical examination at a health care facility 05/11/2011  . Routine gynecological examination 05/11/2011  . Palpitations 12/19/2010  . Stress reaction, emotional 12/19/2010  . Allergic rhinitis 12/19/2010  . Fibrocystic breast disease 12/19/2010  . LEUKORRHEA 08/01/2010  . GERD 04/14/2010  . PLANTAR FASCIITIS, LEFT 04/14/2010  . 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 ASCUS PAP 07/12/2009  . SINUSITIS, CHRONIC FRONTAL 04/22/2007  . ANEMIA, IRON DEFICIENCY NOS 01/04/2007  . HYPERLIPIDEMIA 12/21/2006  . METABOLIC SYNDROME X 83/38/2505  . HYPERTENSION 12/21/2006  . EDEMA 12/21/2006   Past Medical History  Diagnosis Date  . Hyperlipidemia   .  Hypertension   . Breast mass, right 03/14/08    mammogram and ultrasound  . Wears glasses   . GERD (gastroesophageal reflux disease)    Past Surgical History  Procedure Laterality Date  . Cesarean section  [id number]  . Tsa    . Hernia repair  2006    umb  . Tonsillectomy    . Dilation and curettage of uterus  1998  . Breast lumpectomy with needle localization Left 11/18/2012    Procedure: excise ductal system left breast. subarealar. left partial mastectomy with radiographic guidance;  Surgeon: [name], MD;  Location: [place];  Service: General;  Laterality: Left;  excise ductal system left breast. subarealar. left partial mastectomy with radiographic guidance  . Breast ductal system excision Left 11/18/2012    Procedure: EXCISION DUCTAL SYSTEM BREAST;  Surgeon: [name], MD;  Location: [facility];  Service: General;  Laterality: Left;  . Breast surgery     History  Substance Use Topics  . Smoking status: Never Smoker   . Smokeless tobacco: Never Used  . Alcohol Use: No   Family History  Problem Relation Age of Onset  . Hypertension Mother   . Arthritis Mother     osteoarthritis  . Breast cancer Mother   . Hypertension Father   . Arthritis Father     osteoarthritis  . Cancer Maternal Grandmother     breast   Allergies  Allergen Reactions  . Ferrous Sulfate     constipation  . Penicillins     REACTION: ? if allergic as child  . Prilosec [Omeprazole Magnesium]     Dizziness    Current Outpatient Prescriptions on File Prior to Visit  Medication Sig Dispense Refill  . cyclobenzaprine (FLEXERIL) 10 MG tablet Take 1 tablet (10 mg total) by mouth at bedtime as needed for muscle spasms.  30 tablet  0  . diphenhydrAMINE (BENADRYL) 25 mg capsule OTC as needed.      Marland Kitchen lisinopril (PRINIVIL,ZESTRIL) 40 MG tablet Take 1 tablet (40 mg total) by mouth daily.  30 tablet  5   No current facility-administered medications on file prior  to visit.    Review of Systems Review of Systems  Constitutional: Negative for fever, appetite change, fatigue and unexpected weight change.  Eyes: Negative for pain and visual disturbance.  Respiratory: Negative for cough and shortness of breath.   Cardiovascular: Negative for cp or palpitations  Gastrointestinal: Negative for nausea, diarrhea and constipation. pos for external hemorrhoids  Genitourinary: Negative for urgency and frequency.  Skin: Negative for pallor or rash   Neurological: Negative for weakness, light-headedness, and headaches.  Hematological: Negative for adenopathy. Does not bruise/bleed easily.  Psychiatric/Behavioral: Negative for dysphoric mood. The patient is not nervous/anxious.         Objective:   Physical Exam  Constitutional: She appears well-developed and well-nourished. No distress.  overwt and well app  HENT:  Head: Normocephalic and atraumatic.  Mouth/Throat: Oropharynx is clear and moist.  Eyes: Conjunctivae and EOM are normal. Pupils are equal, round, and reactive to light. No scleral icterus.  Neck: Normal range of motion. Neck supple. No JVD present. Carotid bruit is not present. No thyromegaly present.  Cardiovascular: Normal rate, regular rhythm, normal heart sounds and intact distal pulses.  Exam reveals no gallop.   Pulmonary/Chest: Effort normal and breath sounds normal. No respiratory distress. She has no wheezes.  Abdominal: Soft. Bowel sounds are normal. She exhibits no abdominal bruit.  Musculoskeletal: She exhibits no edema.  Lymphadenopathy:    She has no cervical adenopathy.  Neurological: She is alert. She has normal reflexes. No cranial nerve deficit. She exhibits normal muscle tone. Coordination normal.  Skin: Skin is warm and dry. No rash noted. No erythema. No pallor.  Psychiatric: She has a normal mood and affect.          Assessment & Plan:

## 2013-10-13 NOTE — Progress Notes (Signed)
Pre visit review using our clinic review tool, if applicable. No additional management support is needed unless otherwise documented below in the visit note. 

## 2013-10-15 NOTE — Assessment & Plan Note (Signed)
BP: 132/85 mmHg  bp in fair control at this time  No changes needed Disc lifstyle change with low sodium diet and exercise   Will continue current medicines

## 2013-10-15 NOTE — Assessment & Plan Note (Signed)
Will continue prep H and avoid straining  Consider anusol hc if no imp

## 2013-11-03 ENCOUNTER — Other Ambulatory Visit: Payer: Self-pay | Admitting: *Deleted

## 2013-11-03 MED ORDER — LISINOPRIL 40 MG PO TABS: 40.0000 mg | ORAL_TABLET | Freq: Every day | ORAL | Status: DC

## 2013-11-16 ENCOUNTER — Encounter: Payer: Self-pay | Admitting: Family Medicine

## 2014-02-01 ENCOUNTER — Telehealth: Payer: Self-pay | Admitting: Family Medicine

## 2014-02-01 ENCOUNTER — Encounter: Payer: Self-pay | Admitting: Family Medicine

## 2014-02-01 ENCOUNTER — Ambulatory Visit (INDEPENDENT_AMBULATORY_CARE_PROVIDER_SITE_OTHER): Payer: 59 | Admitting: Family Medicine

## 2014-02-01 VITALS — BP 110/64 | HR 79 | Temp 97.9°F | Ht 65.25 in | Wt 168.2 lb

## 2014-02-01 DIAGNOSIS — I10 Essential (primary) hypertension: Secondary | ICD-10-CM

## 2014-02-01 NOTE — Progress Notes (Signed)
   [place] [address] Phone: [phone] Fax: [phone]  Patient ID: Rachael Russell MRN: [id number], DOB: [birth year], 48 y.o. Date of Encounter: 02/01/2014  Primary Physician:  Loura Pardon, MD   Chief Complaint: Hypotension, Dizziness and Fatigue   Subjective:   History of Present Illness:  Rachael Russell is a 48 y.o. very pleasant female patient who presents with the following:  Wt Readings from Last 3 Encounters:  02/01/14 168 lb 4 oz (76.318 kg)  10/13/13 184 lb 8 oz (83.689 kg)  09/26/13 188 lb 8 oz (85.503 kg)   BP Readings from Last 3 Encounters:  02/01/14 110/64  10/13/13 132/85  09/26/13 150/96   Lost 20 pounds in 4 months.  Hands swollen at night.   Hypotensive to 90/50's last night.  Past Medical History, Surgical History, Social History, Family History, Problem List, Medications, and Allergies have been reviewed and updated if relevant.  Review of Systems:  GEN: No acute illnesses, no fevers, chills. GI: No n/v/d, eating normally Pulm: No SOB Interactive and getting along well at home.  Otherwise, ROS is as per the HPI.  Objective:   Physical Examination: BP 110/64  Pulse 79  Temp(Src) 97.9 F (36.6 C) (Oral)  Ht 5' 5.25" (1.657 m)  Wt 168 lb 4 oz (76.318 kg)  BMI 27.80 kg/m2  LMP 01/21/2014   GEN: WDWN, NAD, Non-toxic, A & O x 3 HEENT: Atraumatic, Normocephalic. Neck supple. No masses, No LAD. Ears and Nose: No external deformity. CV: RRR, No M/G/R. No JVD. No thrill. No extra heart sounds. PULM: CTA B, no wheezes, crackles, rhonchi. No retractions. No resp. distress. No accessory muscle use. EXTR: No c/c/e NEURO Normal gait.  PSYCH: Normally interactive. Conversant. Not depressed or anxious appearing.  Calm demeanor.   Laboratory and Imaging Data:  Assessment & Plan:   HYPERTENSION  D/c norvasc.  New Prescriptions   No medications on file   Modified Medications   No medications on file   No orders of  the defined types were placed in this encounter.   Follow-up: No Follow-up on file. Unless noted above, the patient is to follow-up if symptoms worsen. Red flags were reviewed with the patient.  Signed,  Maud Deed. Dillion Stowers, MD, CAQ Sports Medicine   Discontinued Medications   AMLODIPINE (NORVASC) 5 MG TABLET    Take 1 tablet (5 mg total) by mouth daily.   CYCLOBENZAPRINE (FLEXERIL) 10 MG TABLET    Take 1 tablet (10 mg total) by mouth at bedtime as needed for muscle spasms.   DIPHENHYDRAMINE (BENADRYL) 25 MG CAPSULE    OTC as needed.   Current Medications at Discharge:   Medication List       This list is accurate as of: 02/01/14  4:30 PM.  Always use your most recent med list.               lisinopril 40 MG tablet  Commonly known as:  PRINIVIL,ZESTRIL  Take 1 tablet (40 mg total) by mouth daily.     torsemide 20 MG tablet  Commonly known as:  DEMADEX  Take 1 tablet (20 mg total) by mouth daily.

## 2014-02-01 NOTE — Progress Notes (Signed)
Pre visit review using our clinic review tool, if applicable. No additional management support is needed unless otherwise documented below in the visit note. 

## 2014-02-01 NOTE — Patient Instructions (Signed)
STOP THE Amlodipine.   Keep monitoring your BP.  If it persistently is below 120/70:  Cut the lisinopril in half.

## 2014-02-01 NOTE — Telephone Encounter (Signed)
Patient Information:  Caller Name: Rachael Russell  Phone: [phone]  Patient: Rachael Russell, Rachael Russell  Gender: Female  DOB: April 06, 1966  Age: 48 Years  PCP: Rachael Russell [facility])  Pregnant: No  Office Follow Up:  Does the office need to follow up with this patient?: No  Instructions For The Office: N/A  RN Note:  Reports feels fatigued/sluggish at work and for past several days.  Drinking water and moving around when feels sleepy.  Asking if can stop taking Amlodipine initiated at 10/13/13 office visit. Declined 1300 02/01/14 appointment. Requests appointment late afternoon so does not have to travel back to work. Transferred to Rachael Russell/office scheduler for appointment out of order.  Appointment scheduled for 1600 02/01/14 with Dr Rachael Russell.   Symptoms  Reason For Call & Symptoms: Medication and BP questions.  Reports lost 17 lbs since last office visit 10/13/13.  Felt weak and dizzy 01/31/14 and sluggish for past 2 weeks. BP 94/58 left arm at 2340. Decided not to take Lisinopril this morning due to felt sluggish and hypotension. BP 105/64, P 88 at 0953.  Called to ask if can stop taking Amlodipine 5 mg at night.  Reviewed Health History In EMR: Yes  Reviewed Medications In EMR: Yes  Reviewed Allergies In EMR: Yes  Reviewed Surgeries / Procedures: Yes  Date of Onset of Symptoms: 01/31/2014  Treatments Tried: > water intake, skipped morning dose of Lisinopril  Treatments Tried Worked: No OB / GYN:  LMP: 01/21/2014  Guideline(s) Used:  Weakness (Generalized) and Fatigue  Disposition Per Guideline:   See Today in Office  Reason For Disposition Reached:   Taking a medicine that could cause weakness (e.g., blood pressure medications, diuretics)  Advice Given:  Call Back If:  Unable to stand or walk  Passes out  Breathing difficulty occurs  You become worse.  Patient Will Follow Care Advice:  YES

## 2014-04-16 ENCOUNTER — Telehealth: Payer: Self-pay | Admitting: Family Medicine

## 2014-04-16 DIAGNOSIS — Z0000 Encounter for general adult medical examination without abnormal findings: Secondary | ICD-10-CM

## 2014-04-16 NOTE — Telephone Encounter (Signed)
Message copied by Abner Greenspan on Mon Apr 16, 2014  9:42 PM ------      Message from: Ellamae Sia      Created: Tue Apr 10, 2014  3:58 PM      Regarding: Lab orders for Tuesday, 8.25.15       Patient is scheduled for CPX labs, please order future labs, Thanks , Terri       ------

## 2014-04-18 ENCOUNTER — Other Ambulatory Visit (INDEPENDENT_AMBULATORY_CARE_PROVIDER_SITE_OTHER): Payer: 59

## 2014-04-18 DIAGNOSIS — Z0000 Encounter for general adult medical examination without abnormal findings: Secondary | ICD-10-CM

## 2014-04-19 LAB — LIPID PANEL
Chol/HDL Ratio: 3 ratio units (ref 0.0–4.4)
Cholesterol, Total: 206 mg/dL — ABNORMAL HIGH (ref 100–199)
HDL: 68 mg/dL (ref >39)
LDL Calculated: 122 mg/dL — ABNORMAL HIGH (ref 0–99)
Triglycerides: 79 mg/dL (ref 0–149)
VLDL Cholesterol Cal: 16 mg/dL (ref 5–40)

## 2014-04-19 LAB — CBC WITH DIFFERENTIAL/PLATELET
Basophils Absolute: 0 10*3/uL (ref 0.0–0.2)
Basos: 0 %
Eos: 1 %
Eosinophils Absolute: 0.1 10*3/uL (ref 0.0–0.4)
HCT: 31.8 % — ABNORMAL LOW (ref 34.0–46.6)
Hemoglobin: 10.5 g/dL — ABNORMAL LOW (ref 11.1–15.9)
Immature Grans (Abs): 0 10*3/uL (ref 0.0–0.1)
Immature Granulocytes: 0 %
Lymphocytes Absolute: 2.1 10*3/uL (ref 0.7–3.1)
Lymphs: 33 %
MCH: 28.9 pg (ref 26.6–33.0)
MCHC: 33 g/dL (ref 31.5–35.7)
MCV: 88 fL (ref 79–97)
Monocytes Absolute: 0.6 10*3/uL (ref 0.1–0.9)
Monocytes: 9 %
Neutrophils Absolute: 3.5 10*3/uL (ref 1.4–7.0)
Neutrophils Relative %: 57 %
RBC: 3.63 x10E6/uL — ABNORMAL LOW (ref 3.77–5.28)
RDW: 14.1 % (ref 12.3–15.4)
WBC: 6.3 10*3/uL (ref 3.4–10.8)

## 2014-04-19 LAB — COMPREHENSIVE METABOLIC PANEL
ALT: 25 IU/L (ref 0–32)
AST: 23 IU/L (ref 0–40)
Albumin/Globulin Ratio: 1.7 (ref 1.1–2.5)
Albumin: 3.8 g/dL (ref 3.5–5.5)
Alkaline Phosphatase: 59 IU/L (ref 39–117)
BUN/Creatinine Ratio: 13 (ref 9–23)
BUN: 12 mg/dL (ref 6–24)
CO2: 25 mmol/L (ref 18–29)
Calcium: 9.1 mg/dL (ref 8.7–10.2)
Chloride: 103 mmol/L (ref 97–108)
Creatinine, Ser: 0.92 mg/dL (ref 0.57–1.00)
GFR calc Af Amer: 85 mL/min/{1.73_m2} (ref >59)
GFR calc non Af Amer: 74 mL/min/{1.73_m2} (ref >59)
Globulin, Total: 2.3 g/dL (ref 1.5–4.5)
Glucose: 91 mg/dL (ref 65–99)
Potassium: 4.1 mmol/L (ref 3.5–5.2)
Sodium: 141 mmol/L (ref 134–144)
Total Bilirubin: <0.2 mg/dL (ref 0.0–1.2)
Total Protein: 6.1 g/dL (ref 6.0–8.5)

## 2014-04-19 LAB — TSH: TSH: 2.21 u[IU]/mL (ref 0.450–4.500)

## 2014-04-23 ENCOUNTER — Encounter: Payer: Self-pay | Admitting: Family Medicine

## 2014-04-23 ENCOUNTER — Ambulatory Visit (INDEPENDENT_AMBULATORY_CARE_PROVIDER_SITE_OTHER): Payer: 59 | Admitting: Family Medicine

## 2014-04-23 VITALS — BP 126/74 | HR 69 | Temp 98.4°F | Ht 65.25 in | Wt 166.2 lb

## 2014-04-23 DIAGNOSIS — I10 Essential (primary) hypertension: Secondary | ICD-10-CM

## 2014-04-23 DIAGNOSIS — Z01419 Encounter for gynecological examination (general) (routine) without abnormal findings: Secondary | ICD-10-CM

## 2014-04-23 DIAGNOSIS — Z0000 Encounter for general adult medical examination without abnormal findings: Secondary | ICD-10-CM

## 2014-04-23 DIAGNOSIS — D509 Iron deficiency anemia, unspecified: Secondary | ICD-10-CM

## 2014-04-23 DIAGNOSIS — E785 Hyperlipidemia, unspecified: Secondary | ICD-10-CM

## 2014-04-23 MED ORDER — LISINOPRIL 40 MG PO TABS: 40.0000 mg | ORAL_TABLET | Freq: Every day | ORAL | Status: DC

## 2014-04-23 MED ORDER — TORSEMIDE 20 MG PO TABS: 20.0000 mg | ORAL_TABLET | Freq: Every day | ORAL | Status: DC

## 2014-04-23 NOTE — Assessment & Plan Note (Signed)
Disc goals for lipids and reasons to control them Rev labs with pt Rev low sat fat diet in detail Overall stable   

## 2014-04-23 NOTE — Assessment & Plan Note (Signed)
bp in fair control at this time  BP Readings from Last 1 Encounters:  04/23/14 126/74   No changes needed Disc lifstyle change with low sodium diet and exercise  Labs reviewed  Medicines refilled  Commended wt loss and good health habits

## 2014-04-23 NOTE — Assessment & Plan Note (Signed)
Lab Results  Component Value Date   WBC 6.3 04/18/2014   HGB 10.5* 04/18/2014   HCT 31.8* 04/18/2014   MCV 88 04/18/2014   PLT 343 04/14/2013    Will get slow FE otc daily -if not able to find it update  Suspect from heavy menses - perimenopausal - a bit worse Will continue to watch

## 2014-04-23 NOTE — Progress Notes (Signed)
Pre visit review using our clinic review tool, if applicable. No additional management support is needed unless otherwise documented below in the visit note. 

## 2014-04-23 NOTE — Assessment & Plan Note (Signed)
Reviewed health habits including diet and exercise and skin cancer prevention Reviewed appropriate screening tests for age  Also reviewed health mt list, fam hx and immunization status , as well as social and family history   See HPI Declines flu shot  Labs reviewed Commended wt loss

## 2014-04-23 NOTE — Progress Notes (Signed)
Subjective:    Patient ID: Rachael Russell, female    DOB: [birth year], 48 y.o.   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- she continues to watch for nipple d/c  Sore breasts - menses is upcoming  Self exam- ropey tissue in general / but nothing new   Pap nl 8/14  ASCUS in 2012 - is back to yearly monitoring   Menses - are changing as she approaches menopause 23-25 day cycles / sometimes crampy  Lot of PMS    Td 8/10   bp is stable today  No cp or palpitations or headaches or edema  No side effects to medicines  BP Readings from Last 3 Encounters:  04/23/14 126/74  02/01/14 110/64  10/13/13 132/85     Hyperlipidemia  Lab Results  Component Value Date   CHOL 232* 05/11/2011   CHOL 212* 01/03/2007   Lab Results  Component Value Date   HDL 68 04/18/2014   HDL 74 04/14/2013   HDL 59 04/13/2012   Lab Results  Component Value Date   LDLCALC 122* 04/18/2014   LDLCALC 142* 04/14/2013   LDLCALC 148* 04/13/2012   Lab Results  Component Value Date   TRIG 79 04/18/2014   TRIG 59 04/14/2013   TRIG 54 04/13/2012   Lab Results  Component Value Date   CHOLHDL 3.0 04/18/2014   CHOLHDL 3.1 04/14/2013   CHOLHDL 3.5 08/07/2011   Lab Results  Component Value Date   LDLDIRECT 149.9 05/11/2011   LDLDIRECT 123.2 01/03/2007    Is improved -happy with that   K is 4.1 -even without K dosage  Not on iron  Lab Results  Component Value Date   WBC 6.3 04/18/2014   HGB 10.5* 04/18/2014   HCT 31.8* 04/18/2014   MCV 88 04/18/2014   PLT 343 04/14/2013   this is down  Not taking iron  Is very tired around  period time  This is from menses Needs to get back on slow FE   Patient Active Problem List   Diagnosis Date Noted  . Hemorrhoids, external 10/13/2013  . Right arm pain 09/26/2013  . Cervical disc disorder with radiculopathy of cervical region 09/15/2013  . Herpes zoster 03/07/2013  . 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 Nipple discharge, bloody 10/21/2012  . Routine general medical examination at a health care facility 05/11/2011  . Routine gynecological examination 05/11/2011  . Palpitations 12/19/2010  . Stress reaction, emotional 12/19/2010  . Allergic rhinitis 12/19/2010  . Fibrocystic breast disease 12/19/2010  . LEUKORRHEA 08/01/2010  . GERD 04/14/2010  . PLANTAR FASCIITIS, LEFT 04/14/2010  . 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 ASCUS PAP 07/12/2009  . SINUSITIS, CHRONIC FRONTAL 04/22/2007  . ANEMIA, IRON DEFICIENCY NOS 01/04/2007  . HYPERLIPIDEMIA 12/21/2006  . METABOLIC SYNDROME X 34/28/7681  . HYPERTENSION 12/21/2006  . EDEMA 12/21/2006   Past Medical History  Diagnosis Date  . Hyperlipidemia   . Hypertension   . Breast mass, right 03/14/08  mammogram and ultrasound  . Wears glasses   . GERD (gastroesophageal reflux disease)    Past Surgical History  Procedure Laterality Date  . Cesarean section  [id number]  . Tsa    . Hernia repair  2006    umb  . Tonsillectomy    . Dilation and curettage of uterus  1998  . Breast lumpectomy with needle localization Left 11/18/2012    Procedure: excise ductal system left breast. subarealar. left partial mastectomy with radiographic guidance;  Surgeon: [name], MD;  Location: [facility];  Service: General;  Laterality: Left;  excise ductal system left breast. subarealar. left partial mastectomy with radiographic guidance  . Breast ductal system excision Left 11/18/2012    Procedure: EXCISION DUCTAL SYSTEM BREAST;  Surgeon: [name], MD;  Location: [facility];  Service: General;  Laterality: Left;  . Breast surgery     History  Substance Use Topics  . Smoking status: Never Smoker   . Smokeless tobacco: Never Used  . Alcohol Use: No   Family History  Problem Relation Age of Onset  . Hypertension Mother   . Arthritis Mother     osteoarthritis  . Breast cancer Mother   . Hypertension Father   . Arthritis Father     osteoarthritis  . Cancer Maternal Grandmother     breast   Allergies  Allergen Reactions  . Ferrous Sulfate     constipation  . Penicillins     REACTION: ? if allergic as child  . Prilosec [Omeprazole Magnesium]     Dizziness    No current outpatient prescriptions on file prior to visit.   No current facility-administered medications on file prior to visit.    Review of Systems    Review of Systems  Constitutional: Negative for fever, appetite change, and unexpected weight change.pos for fatigue   Eyes: Negative for pain and visual disturbance.  Respiratory: Negative for cough and shortness of breath.   Cardiovascular: Negative for cp or palpitations    Gastrointestinal: Negative for nausea, diarrhea and constipation.  Genitourinary: Negative for urgency and frequency. pos for menstrual irreg and heavy menses  Skin: Negative for pallor or rash   Neurological: Negative for weakness, light-headedness, numbness and headaches.  Hematological: Negative for adenopathy. Does not bruise/bleed easily.  Psychiatric/Behavioral: Negative for dysphoric mood. The patient is not nervous/anxious.      Objective:   Physical Exam  Constitutional: She appears well-developed and well-nourished. No distress.  HENT:  Head: Normocephalic and atraumatic.  Right Ear: External ear normal.  Left Ear: External ear normal.  Mouth/Throat: Oropharynx is clear and moist.  Eyes: Conjunctivae and EOM are normal. Pupils are equal, round, and reactive to light. No scleral icterus.  Neck: Normal range of motion. Neck supple. No  JVD present. Carotid bruit is not present. No thyromegaly present.  Cardiovascular: Normal rate, regular rhythm, normal heart sounds and intact distal pulses.  Exam reveals no gallop.   Pulmonary/Chest: Effort normal and breath sounds normal. No respiratory distress. She has no wheezes. She exhibits no tenderness.  Abdominal: Soft. Bowel sounds are normal. She exhibits no distension, no abdominal bruit and no mass. There is no tenderness.  Genitourinary: Vagina normal. No breast swelling, tenderness, discharge or bleeding. There is no rash, tenderness or lesion on the right labia. There is no rash, tenderness or lesion on the left labia. Uterus is enlarged. Uterus is not tender. Cervix exhibits no motion tenderness, no discharge and no friability.  Right adnexum displays no mass, no tenderness and no fullness. Left adnexum displays no mass, no tenderness and no fullness. No erythema or bleeding around the vagina. No vaginal discharge found.  Breast exam: No mass, nodules, thickening, tenderness, bulging, retraction, inflamation, nipple discharge or skin changes noted.  No axillary or clavicular LA.      slt enl uterus /consistent with fibroid uterus   Musculoskeletal: Normal range of motion. She exhibits no edema and no tenderness.  Lymphadenopathy:    She has no cervical adenopathy.  Neurological: She is alert. She has normal reflexes. No cranial nerve deficit. She exhibits normal muscle tone. Coordination normal.  Skin: Skin is warm and dry. No rash noted. No erythema. No pallor.  Psychiatric: She has a normal mood and affect.          Assessment & Plan:   Problem List Items Addressed This Visit     Cardiovascular and Mediastinum   HYPERTENSION - Primary      bp in fair control at this time  BP Readings from Last 1 Encounters:  04/23/14 126/74   No changes needed Disc lifstyle change with low sodium diet and exercise  Labs reviewed  Medicines refilled  Commended wt loss and good  health habits     Relevant Medications      torsemide (DEMADEX) tablet      lisinopril (PRINIVIL,ZESTRIL) tablet     Other   HYPERLIPIDEMIA     Disc goals for lipids and reasons to control them Rev labs with pt Rev low sat fat diet in detail Overall stable      Relevant Medications      torsemide (DEMADEX) tablet      lisinopril (PRINIVIL,ZESTRIL) tablet   ANEMIA, IRON DEFICIENCY NOS      Lab Results  Component Value Date   WBC 6.3 04/18/2014   HGB 10.5* 04/18/2014   HCT 31.8* 04/18/2014   MCV 88 04/18/2014   PLT 343 04/14/2013    Will get slow FE otc daily -if not able to find it update  Suspect from heavy menses - perimenopausal - a bit worse Will continue to watch    Routine general medical examination at a health care facility     Reviewed health habits including diet and exercise and skin cancer prevention Reviewed appropriate screening tests for age  Also reviewed health mt list, fam hx and immunization status , as well as social and family history   See HPI Declines flu shot  Labs reviewed Commended wt loss     Routine gynecological examination     Exam done Some perimenopausal changes in menses Also fibroid uterus on exam  Will tx anemia from above with slow FE Keep updated     Relevant Orders      Pap LB (liquid-based)

## 2014-04-23 NOTE — Patient Instructions (Signed)
You are more anemic from your periods  Get back on your slow FE once daily - if you cannot find that let me know  [place] job with weight loss

## 2014-04-23 NOTE — Assessment & Plan Note (Signed)
Exam done Some perimenopausal changes in menses Also fibroid uterus on exam  Will tx anemia from above with slow FE Keep updated

## 2014-04-25 LAB — PAP LB (LIQUID-BASED): PAP Smear Comment: 0

## 2014-04-26 ENCOUNTER — Telehealth: Payer: Self-pay | Admitting: *Deleted

## 2014-04-26 ENCOUNTER — Telehealth: Payer: Self-pay | Admitting: Family Medicine

## 2014-04-26 NOTE — Telephone Encounter (Signed)
Pt faxed over a wellness form based off of her recent CPE, form placed in your inbox

## 2014-04-26 NOTE — Telephone Encounter (Signed)
error 

## 2014-04-27 NOTE — Telephone Encounter (Signed)
Based on this - does she need a PPD or not? I put it in IN box

## 2014-04-27 NOTE — Telephone Encounter (Signed)
Since all of her answers to the TB screening test were no, pt doesn't need a TB screening test, form faxed back to pt and sent to scanning

## 2014-05-17 ENCOUNTER — Encounter: Payer: Self-pay | Admitting: Family Medicine

## 2014-11-05 ENCOUNTER — Telehealth: Payer: Self-pay | Admitting: Family Medicine

## 2014-11-05 NOTE — Telephone Encounter (Signed)
I will see her then  

## 2014-11-05 NOTE — Telephone Encounter (Signed)
Pt has appt with Dr Glori Bickers 11/06/14 at 11:45 AM.

## 2014-11-05 NOTE — Telephone Encounter (Signed)
Patient Name: Rachael Russell DOB: May 01, 1966 Initial Comment Caller states has bad cough, scratchy throat; wheezing last night; temp 100.4; not wheezing today; Nurse Assessment Nurse: Marcelline Deist, RN, Lynda Date/Time (Eastern Time): 11/05/2014 11:30:49 AM Confirm and document reason for call. If symptomatic, describe symptoms. ---Caller states has bad cough, scratchy throat. Had some wheezing last night on her right side. Her temp. is 100.4. Not wheezing today; Also has a bad headache. Has the patient traveled out of the country within the last 30 days? ---Not Applicable Does the patient require triage? ---Yes Related visit to physician within the last 2 weeks? ---No Does the PT have any chronic conditions? (i.e. diabetes, asthma, etc.) ---No Did the patient indicate they were pregnant? ---No Guidelines Guideline Title Affirmed Question Affirmed Notes Cough - Acute Productive [1] Continuous (nonstop) coughing interferes with work or school AND [2] no improvement using cough treatment per Care Advice Final Disposition User See Physician within [place], Therapist, sports, [place]

## 2014-11-05 NOTE — Telephone Encounter (Signed)
PLEASE NOTE: All timestamps contained within this report are represented as [place] Standard Time. CONFIDENTIALTY NOTICE: This fax transmission is intended only for the addressee. It contains information that is legally privileged, confidential or otherwise protected from use or disclosure. If you are not the intended recipient, you are strictly prohibited from reviewing, disclosing, copying using or disseminating any of this information or taking any action in reliance on or regarding this information. If you have received this fax in error, please notify us immediately by telephone so that we can arrange for its return to [place]. Phone: [phone], Toll-Free: [phone], Fax: [phone] Page: 1 of 2 Call Id: [id number] [place] Patient Name: Rachael Russell Gender: Female DOB: Feb 26, 1966 Age: 49 Y 2 M 14 D Return Phone Number: [phone] (Primary) Address: [address] [place]rows [facility] [address] Client [facility] - Client Client Site [place], Roque Lias Contact Type Call Call Type Triage / Clinical Relationship To Patient Self Appointment Disposition EMR Appointment Scheduled Return Phone Number [phone] (Primary) Chief Complaint Cough Initial Comment Caller states has bad cough, scratchy throat; wheezing last night; temp 100.4; not wheezing today; PreDisposition Call Doctor Info pasted into Epic Yes Nurse Assessment Nurse: Marcelline Deist, RN, Kermit Balo Date/Time (Eastern Time): 11/05/2014 11:30:49 AM Confirm and document reason for call. If symptomatic, describe symptoms. ---Caller states has bad cough, scratchy throat. Had some wheezing last night on her right side. Her temp. is 100.4. Not wheezing today; Also has a bad headache. Has the patient traveled out of the country within the last 30 days? ---Not  Applicable Does the patient require triage? ---Yes Related visit to physician within the last 2 weeks? ---No Does the PT have any chronic conditions? (i.e. diabetes, asthma, etc.) ---No Did the patient indicate they were pregnant? ---No Guidelines Guideline Title Affirmed Question Affirmed Notes Nurse Date/Time (Eastern Time) Cough - Acute Productive [1] Continuous (nonstop) coughing interferes with work or school AND [2] no improvement using cough treatment per [place], RN, Vina 11/05/2014 11:32:55 AM Disp. Time Eilene Ghazi Time) Disposition Final User 11/05/2014 11:11:29 AM Attempt made - message left Harlon Ditty 11/05/2014 11:43:45 AM See Physician within 24 Hours Yes Marcelline Deist, RN, Kermit Balo PLEASE NOTE: All timestamps contained within this report are represented as [place] Standard Time. CONFIDENTIALTY NOTICE: This fax transmission is intended only for the addressee. It contains information that is legally privileged, confidential or otherwise protected from use or disclosure. If you are not the intended recipient, you are strictly prohibited from reviewing, disclosing, copying using or disseminating any of this information or taking any action in reliance on or regarding this information. If you have received this fax in error, please notify us immediately by telephone so that we can arrange for its return to [place]. Phone: [phone], Toll-Free: [phone], Fax: [phone] Page: 2 of 2 Call Id: [id number] Caller Understands: Yes Disagree/Comply: Comply Care Advice Given Per Guideline SEE PHYSICIAN WITHIN 24 HOURS: COUGH MEDICINES: - OTC COUGH SYRUPS: The most common cough suppressant in OTC cough medications is dextromethorphan. Often the letters 'DM' appear in the name. - OTC COUGH DROPS: Cough drops can help a lot, especially for mild coughs. They reduce coughing by soothing your irritated throat and removing that tickle sensation in the back of the throat. Cough  drops also have the advantage of portability - you can carry them with you. HUMIDIFIER: If the air  is dry, use a humidifier in the bedroom. (Reason: dry air makes coughs worse) * IF OFFICE WILL BE OPEN: You need to be examined within the next 24 hours. Call your doctor when the office opens, and make an appointment. OTC COUGH SYRUP - DEXTROMETHORPHAN: * Cough syrups containing the cough suppressant dextromethorphan (DM) may help decrease your cough. Cough syrups work best for coughs that keep you awake at night. They can also sometimes help in the late stages of a respiratory infection when the cough is dry and hacking. They can be used along with cough drops. * Do not try to completely suppress coughs that produce mucus and phlegm. Remember that coughing is helpful in bringing up mucus from the lungs and preventing pneumonia. CAUTION - DEXTROMETHORPHAN: CARE ADVICE given per Cough - Acute Productive (Adult) guideline. CALL BACK IF: * Difficulty breathing occurs * You become worse. COUGHING SPELLS: * Drink warm fluids. Inhale warm mist. (Reason: both relax the airway and loosen up the phlegm) - HOME REMEDY - HONEY: This old home remedy has been shown to help decrease coughing at night. The adult dosage is 2 teaspoons (10 ml) at bedtime. Honey should not be given to infants under one year of age. After Care Instructions Given Call Event Type User Date / Time Description Referrals REFERRED TO PCP OFFICE

## 2014-11-06 ENCOUNTER — Ambulatory Visit (INDEPENDENT_AMBULATORY_CARE_PROVIDER_SITE_OTHER): Payer: 59 | Admitting: Family Medicine

## 2014-11-06 ENCOUNTER — Encounter: Payer: Self-pay | Admitting: Family Medicine

## 2014-11-06 VITALS — BP 116/88 | HR 88 | Temp 98.3°F | Ht 65.25 in | Wt 164.1 lb

## 2014-11-06 DIAGNOSIS — B9789 Other viral agents as the cause of diseases classified elsewhere: Principal | ICD-10-CM

## 2014-11-06 DIAGNOSIS — R5382 Chronic fatigue, unspecified: Secondary | ICD-10-CM

## 2014-11-06 DIAGNOSIS — D509 Iron deficiency anemia, unspecified: Secondary | ICD-10-CM

## 2014-11-06 DIAGNOSIS — J069 Acute upper respiratory infection, unspecified: Secondary | ICD-10-CM

## 2014-11-06 DIAGNOSIS — R5383 Other fatigue: Secondary | ICD-10-CM | POA: Insufficient documentation

## 2014-11-06 NOTE — Progress Notes (Signed)
Subjective:    Patient ID: Rachael Russell, female    DOB: 04-Dec-1965, 49 y.o.   MRN: [id number]  HPI Here with uri symptoms  Started Sunday  Yesterday bad cough  Fever 100.4- a little achey  Headache and post nasal drip  A little better today  otc nyquil cold  Also tylenol   Had some sinus pain above eyes -improved today  Yellow d/c  Also cough is productive    Is generally really drained -over a month  She can "hardly get up in the am"  Nothing has really changed   Patient Active Problem List   Diagnosis Date Noted  . Hemorrhoids, external 10/13/2013  . Right arm pain 09/26/2013  . Cervical disc disorder with radiculopathy of cervical region 09/15/2013  . Herpes zoster 03/07/2013  . 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 Nipple discharge, bloody 10/21/2012  . Routine general medical examination at a health care facility 05/11/2011  . Routine gynecological examination 05/11/2011  . Palpitations 12/19/2010  . Stress reaction, emotional 12/19/2010  . Allergic rhinitis 12/19/2010  . Fibrocystic breast disease 12/19/2010  . LEUKORRHEA 08/01/2010  . GERD 04/14/2010  . PLANTAR FASCIITIS, LEFT 04/14/2010  . 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 ASCUS PAP 07/12/2009  . SINUSITIS, CHRONIC FRONTAL 04/22/2007  . ANEMIA, IRON DEFICIENCY NOS 01/04/2007  . HYPERLIPIDEMIA 12/21/2006  . METABOLIC SYNDROME X 00/76/2263  . HYPERTENSION 12/21/2006  . EDEMA 12/21/2006   Past Medical History  Diagnosis Date  . Hyperlipidemia   . Hypertension   . Breast mass, right 03/14/08    mammogram and ultrasound  . Wears glasses   . GERD (gastroesophageal reflux disease)    Past Surgical History  Procedure Laterality Date  . Cesarean section  [id number]  . Tsa    . Hernia repair  2006    umb  . Tonsillectomy    . Dilation and curettage of uterus  1998  . Breast lumpectomy with needle localization Left 11/18/2012    Procedure: excise ductal system left  breast. subarealar. left partial mastectomy with radiographic guidance;  Surgeon: [name], MD;  Location: [facility];  Service: General;  Laterality: Left;  excise ductal system left breast. subarealar. left partial mastectomy with radiographic guidance  . Breast ductal system excision Left 11/18/2012    Procedure: EXCISION DUCTAL SYSTEM BREAST;  Surgeon: [name], MD;  Location: [facility];  Service: General;  Laterality: Left;  . Breast surgery     History  Substance Use Topics  . Smoking status: Never Smoker   . Smokeless tobacco: Never Used  . Alcohol Use: No   Family History  Problem Relation Age of Onset  . Hypertension Mother   . Arthritis Mother     osteoarthritis  . Breast cancer Mother   . Hypertension Father   . Arthritis Father     osteoarthritis  . Cancer Maternal Grandmother     breast   Allergies  Allergen Reactions  . Ferrous Sulfate     constipation  . Penicillins     REACTION: ? if allergic as child  . Prilosec [Omeprazole Magnesium]     Dizziness    Current Outpatient Prescriptions on File Prior to Visit  Medication Sig Dispense Refill  . lisinopril (PRINIVIL,ZESTRIL) 40 MG tablet Take 1 tablet (40 mg total) by mouth daily. 90 tablet 3  . torsemide (DEMADEX) 20 MG tablet Take 1 tablet (20 mg total) by mouth daily.  90 tablet 3   No current facility-administered medications on file prior to visit.     Review of Systems Review of Systems  Constitutional: Negative for fever, appetite change,  and unexpected weight change. pos for fatigue  Eyes: Negative for pain and visual disturbance.  ENt pos for cong and rhinorrhea and drip /neg for sinus pain today Respiratory: Negative for wheeze  and shortness of breath. Pos for loud snoring at night   Cardiovascular: Negative for cp or palpitations    Gastrointestinal: Negative for nausea, diarrhea and constipation.  Genitourinary: Negative for urgency and frequency.    Skin: Negative for pallor or rash   Neurological: Negative for weakness, light-headedness, numbness and headaches.  Hematological: Negative for adenopathy. Does not bruise/bleed easily.  Psychiatric/Behavioral: Negative for dysphoric mood. The patient is not nervous/anxious.         Objective:   Physical Exam  Constitutional: She appears well-developed and well-nourished. No distress.  HENT:  Head: Normocephalic and atraumatic.  Right Ear: External ear normal.  Left Ear: External ear normal.  Nose: Nose normal.  Mouth/Throat: Oropharynx is clear and moist.  Nares are injected and congested   Clear rhinorrhea  Post nasal drip     Eyes: Conjunctivae and EOM are normal. Pupils are equal, round, and reactive to light. Right eye exhibits no discharge. Left eye exhibits no discharge. No scleral icterus.  Neck: Normal range of motion. Neck supple. No JVD present. No thyromegaly present.  Cardiovascular: Normal rate, regular rhythm, normal heart sounds and intact distal pulses.  Exam reveals no gallop.   Pulmonary/Chest: Effort normal and breath sounds normal. No respiratory distress. She has no wheezes. She has no rales.  Abdominal: Soft. Bowel sounds are normal. She exhibits no distension and no mass. There is no tenderness.  Musculoskeletal: She exhibits no edema or tenderness.  Lymphadenopathy:    She has no cervical adenopathy.  Neurological: She is alert. She has normal reflexes. No cranial nerve deficit. She exhibits normal muscle tone. Coordination normal.  Skin: Skin is warm and dry. No rash noted. No erythema. No pallor.  Psychiatric: She has a normal mood and affect.          Assessment & Plan:   Problem List Items Addressed This Visit      Respiratory   Viral URI with cough - Primary    Improved from yesterday and reassuring exam  Disc symptomatic care - see instructions on AVS  Update if not starting to improve in a week or if worsening          Relevant  Orders   Comprehensive metabolic panel   Vit D  25 hydroxy (rtn osteoporosis monitoring)   Vitamin B12   TSH   Ferritin   CBC with Differential/Platelet     Other   Fatigue    Generalized fatigue for 1 mo  Has hx of anemia  Also snores - but has no hx of sleep apnea or work up for it  Will check lab today Consider eval for sleep apnea if no source found       Relevant Orders   Comprehensive metabolic panel   Vit D  25 hydroxy (rtn osteoporosis monitoring)   Vitamin B12   TSH   Ferritin   CBC with Differential/Platelet   Iron deficiency anemia    Pt feels more tired and req we check this  Cbc and ferritin today      Relevant Orders   Comprehensive metabolic panel  Vit D  25 hydroxy (rtn osteoporosis monitoring)   Vitamin B12   TSH   Ferritin   CBC with Differential/Platelet

## 2014-11-06 NOTE — Assessment & Plan Note (Signed)
Generalized fatigue for 1 mo  Has hx of anemia  Also snores - but has no hx of sleep apnea or work up for it  Will check lab today Consider eval for sleep apnea if no source found

## 2014-11-06 NOTE — Progress Notes (Signed)
Pre visit review using our clinic review tool, if applicable. No additional management support is needed unless otherwise documented below in the visit note. 

## 2014-11-06 NOTE — Patient Instructions (Signed)
Labs today for fatigue and anemia For viral upper respiratory infection- rest / fluids  Try mucinex DM or mucinex dm max  If sinus pain or increasing fever please let me know

## 2014-11-06 NOTE — Assessment & Plan Note (Signed)
Pt feels more tired and req we check this  Cbc and ferritin today

## 2014-11-06 NOTE — Assessment & Plan Note (Signed)
Improved from yesterday and reassuring exam  Disc symptomatic care - see instructions on AVS  Update if not starting to improve in a week or if worsening

## 2014-11-07 ENCOUNTER — Other Ambulatory Visit: Payer: Self-pay

## 2014-11-07 LAB — CBC WITH DIFFERENTIAL/PLATELET
Basophils Absolute: 0 10*3/uL (ref 0.0–0.2)
Basos: 0 %
Eos: 0 %
Eosinophils Absolute: 0 10*3/uL (ref 0.0–0.4)
HCT: 40.9 % (ref 34.0–46.6)
Hemoglobin: 14.1 g/dL (ref 11.1–15.9)
Immature Grans (Abs): 0 10*3/uL (ref 0.0–0.1)
Immature Granulocytes: 0 %
Lymphocytes Absolute: 1 10*3/uL (ref 0.7–3.1)
Lymphs: 21 %
MCH: 30.9 pg (ref 26.6–33.0)
MCHC: 34.5 g/dL (ref 31.5–35.7)
MCV: 90 fL (ref 79–97)
Monocytes Absolute: 0.7 10*3/uL (ref 0.1–0.9)
Monocytes: 15 %
Neutrophils Absolute: 2.8 10*3/uL (ref 1.4–7.0)
Neutrophils Relative %: 64 %
Platelets: 262 10*3/uL (ref 150–379)
RBC: 4.57 x10E6/uL (ref 3.77–5.28)
RDW: 14.2 % (ref 12.3–15.4)
WBC: 4.5 10*3/uL (ref 3.4–10.8)

## 2014-11-07 LAB — COMPREHENSIVE METABOLIC PANEL
ALT: 7 IU/L (ref 0–32)
AST: 14 IU/L (ref 0–40)
Albumin/Globulin Ratio: 1.6 (ref 1.1–2.5)
Albumin: 4.6 g/dL (ref 3.5–5.5)
Alkaline Phosphatase: 69 IU/L (ref 39–117)
BUN/Creatinine Ratio: 9 (ref 9–23)
BUN: 9 mg/dL (ref 6–24)
Bilirubin Total: 0.2 mg/dL (ref 0.0–1.2)
CO2: 20 mmol/L (ref 18–29)
Calcium: 9.1 mg/dL (ref 8.7–10.2)
Chloride: 102 mmol/L (ref 97–108)
Creatinine, Ser: 0.97 mg/dL (ref 0.57–1.00)
GFR calc Af Amer: 79 mL/min/{1.73_m2} (ref >59)
GFR calc non Af Amer: 69 mL/min/{1.73_m2} (ref >59)
Globulin, Total: 2.8 g/dL (ref 1.5–4.5)
Glucose: 90 mg/dL (ref 65–99)
Potassium: 4.2 mmol/L (ref 3.5–5.2)
Sodium: 141 mmol/L (ref 134–144)
Total Protein: 7.4 g/dL (ref 6.0–8.5)

## 2014-11-07 LAB — TSH: TSH: 0.9 u[IU]/mL (ref 0.450–4.500)

## 2014-11-07 LAB — VITAMIN B12: Vitamin B-12: 535 pg/mL (ref 211–946)

## 2014-11-07 LAB — VITAMIN D 25 HYDROXY (VIT D DEFICIENCY, FRACTURES): Vit D, 25-Hydroxy: 6.8 ng/mL — ABNORMAL LOW (ref 30.0–100.0)

## 2014-11-07 LAB — FERRITIN: Ferritin: 43 ng/mL (ref 15–150)

## 2014-11-07 MED ORDER — VITAMIN D (ERGOCALCIFEROL) 1.25 MG (50000 UNIT) PO CAPS: 50000.0000 [IU] | ORAL_CAPSULE | ORAL | Status: DC

## 2015-01-15 ENCOUNTER — Telehealth: Payer: Self-pay

## 2015-01-15 DIAGNOSIS — N6012 Diffuse cystic mastopathy of left breast: Secondary | ICD-10-CM

## 2015-01-15 DIAGNOSIS — N63 Unspecified lump in unspecified breast: Secondary | ICD-10-CM | POA: Insufficient documentation

## 2015-01-15 NOTE — Telephone Encounter (Signed)
In 12 / 2015 had bleeding from nipple on lt breast but bleeding stopped so did not come in for appt.  No bleeding since one time in Dec 2015. Now pt said the lt side of lt breast feels different; pt said has cystic breast so difficult to describe what feels different.  Pt request order to get mammogram done at [facility] breast center. Pt request cb. Last mammogram 04/2014.

## 2015-01-15 NOTE — Telephone Encounter (Signed)
I put an order in for mammogram and [place] of L breast for [facility]  Will route to [place]  Will see how report looks  Has seen Dr Dalbert Batman in the past I think - will refer if needed

## 2015-01-16 NOTE — Telephone Encounter (Signed)
Pt notified Dr. Glori Bickers put in order and our [facility] will call to schedule appt and pt advise of Dr. Marliss Coots comments/recommendations

## 2015-01-16 NOTE — Telephone Encounter (Signed)
Called [facility] and because she is now overdue for her annual MmG, they need you to order a Bilateral Diagnostic mammogram. You already have the order in for the Left Breast US just please cancel the Uni MMG and put in Bilateral. Patient is set up for 01/23/15 at 10:45am. [facility]

## 2015-01-17 NOTE — Addendum Note (Signed)
Addended by: Loura Pardon A on: 01/17/2015 09:26 AM   Modules accepted: Orders

## 2015-01-17 NOTE — Telephone Encounter (Signed)
Done

## 2015-01-29 ENCOUNTER — Encounter: Payer: Self-pay | Admitting: Family Medicine

## 2015-02-05 ENCOUNTER — Telehealth: Payer: Self-pay

## 2015-02-05 NOTE — Telephone Encounter (Signed)
Pt is going on cruise the end of June and pts regular menstrual cycle will be occurring at that time also; pt wants to know if there is a safe way or med to postpone menstrual cycle. Pt request cb. [facility] [place].

## 2015-02-05 NOTE — Telephone Encounter (Signed)
I would start the pill pack 1-2 weeks before the trip  When starting an OC - poss side eff include nausea/breast tenderness/irregular bleeding - (most of the time none but we never know what to expect for sure) Is there a pill that she has taken before that she tolerated well? -let me know

## 2015-02-05 NOTE — Telephone Encounter (Signed)
Left voicemail requesting pt to call office 

## 2015-02-05 NOTE — Telephone Encounter (Signed)
We would have to put her on a pack of birth control pills - if she started a pack now it may keep her from getting her period for a month (but no guarantees since she has not been on one lately)- would not do long term because it may raise her bp  Let me know if she wants to try that

## 2015-02-05 NOTE — Telephone Encounter (Signed)
Pt said she is willing to try a pack of BCP, pt did want to know if there is any side eff of starting BCP for just one month. Pt said she is going on the cruise from 02/21/15 - 03/02/15. Pt just finished her period this Sunday 02/02/15 so she would want to know when should she start taking the pills because her next period would start around 02/24/15-02/27/15, please send Rx to pharmacy on file

## 2015-02-06 MED ORDER — NORGESTIM-ETH ESTRAD TRIPHASIC 0.18/0.215/0.25 MG-35 MCG PO TABS: 1.0000 | ORAL_TABLET | Freq: Every day | ORAL | Status: DC

## 2015-02-06 NOTE — Telephone Encounter (Signed)
Will refill electronically [facility] her [facility]

## 2015-02-06 NOTE — Telephone Encounter (Signed)
Patient returned Shapale's call.  Please call her back at [phone].

## 2015-02-06 NOTE — Telephone Encounter (Signed)
Pt notified of Dr. Marliss Coots comments/recommendations and common side eff. Pt said she has tried ortho tri cyclen in the past with no problems and she would like to try it again. Please send in Rx to pharmacy on file

## 2015-02-07 ENCOUNTER — Other Ambulatory Visit: Payer: Self-pay | Admitting: Radiology

## 2015-02-11 ENCOUNTER — Encounter: Payer: Self-pay | Admitting: Family Medicine

## 2015-02-20 ENCOUNTER — Ambulatory Visit: Payer: 59 | Admitting: Internal Medicine

## 2015-02-20 ENCOUNTER — Ambulatory Visit
Admission: EM | Admit: 2015-02-20 | Discharge: 2015-02-20 | Disposition: A | Discharge status: Home / Self Care | Payer: 59 | Attending: Internal Medicine | Admitting: Internal Medicine

## 2015-02-20 ENCOUNTER — Telehealth: Payer: Self-pay | Admitting: Family Medicine

## 2015-02-20 ENCOUNTER — Encounter: Payer: Self-pay | Admitting: *Deleted

## 2015-02-20 DIAGNOSIS — I10 Essential (primary) hypertension: Secondary | ICD-10-CM | POA: Insufficient documentation

## 2015-02-20 DIAGNOSIS — K219 Gastro-esophageal reflux disease without esophagitis: Secondary | ICD-10-CM | POA: Diagnosis not present

## 2015-02-20 DIAGNOSIS — E785 Hyperlipidemia, unspecified: Secondary | ICD-10-CM | POA: Insufficient documentation

## 2015-02-20 DIAGNOSIS — R1030 Lower abdominal pain, unspecified: Secondary | ICD-10-CM | POA: Diagnosis present

## 2015-02-20 DIAGNOSIS — Z79890 Hormone replacement therapy (postmenopausal): Secondary | ICD-10-CM

## 2015-02-20 DIAGNOSIS — Z79899 Other long term (current) drug therapy: Secondary | ICD-10-CM | POA: Diagnosis not present

## 2015-02-20 DIAGNOSIS — R102 Pelvic and perineal pain: Secondary | ICD-10-CM | POA: Diagnosis not present

## 2015-02-20 LAB — URINALYSIS COMPLETE WITH MICROSCOPIC (ARMC ONLY)
Bacteria, UA: NONE SEEN — AB
Bilirubin Urine: NEGATIVE
Glucose, UA: NEGATIVE mg/dL
Hgb urine dipstick: NEGATIVE
Leukocytes, UA: NEGATIVE
Nitrite: NEGATIVE
Specific Gravity, Urine: 1.025 (ref 1.005–1.030)
pH: 6 (ref 5.0–8.0)

## 2015-02-20 LAB — WET PREP, GENITAL
Clue Cells Wet Prep HPF POC: NONE SEEN
Trich, Wet Prep: NONE SEEN
Yeast Wet Prep HPF POC: NONE SEEN

## 2015-02-20 NOTE — Telephone Encounter (Signed)
Patient Name: Rachael Russell  DOB: 1965/10/23    Initial Comment caller states she has lower abdominal pressure/discomfort   Nurse Assessment  Nurse: Mallie Mussel, RN, Alveta Heimlich Date/Time Eilene Ghazi Time): 02/20/2015 2:21:53 PM  Confirm and document reason for call. If symptomatic, describe symptoms. ---Caller states that she has some lower abdominal pressure/discomfort which began Monday. She denies pain and burning with urination. She denies discharge. She rates the pain as 2 on 0-10 scale. This is not constant. Denies fever.  Has the patient traveled out of the country within the last 30 days? ---No  Does the patient require triage? ---Yes  Related visit to physician within the last 2 weeks? ---No  Does the PT have any chronic conditions? (i.e. diabetes, asthma, etc.) ---Yes  List chronic conditions. ---HTN  Did the patient indicate they were pregnant? ---No     Guidelines    Guideline Title Affirmed Question Affirmed Notes  Abdominal Pain - Female [1] MILD (e.g., does not interfere with normal activities) AND [2] pain comes and goes (cramps) AND [3] present > 48 hours    Final Disposition User   Clinical Call Mallie Mussel, RN, Alveta Heimlich    Comments  Caller states that she is in [place] at this time, could she be seen there at [facility] I checked and no appointments were available. She then asked me to check with [facility] and nothing was available in the system. I called the backline and spoke with Rena. I did a warm transfer to [facility] for further assistance.

## 2015-02-20 NOTE — ED Notes (Addendum)
Pt states "lower abdominal, above pubic hair line, itching and pressure.  No urinary frequency, no burning, no discharge. Started on monday"

## 2015-02-20 NOTE — Discharge Instructions (Signed)
I will let you know results of tests done tonight when they are available. I think your pelvic discomfort is most likely related to recently starting some hormone therapy, and is not dangerous.

## 2015-02-20 NOTE — Telephone Encounter (Signed)
TH transferred call; pt having lower abd discomfort; no UTI symptoms; pt going out of town on 02/21/15 in early AM and wants to be seen prior to leaving town; no available appts at [facility] and pt scheduled appt with Dr Cathlean Cower at [facility] 02/20/15 at 5:30 pm.

## 2015-02-21 LAB — CHLAMYDIA/NGC RT PCR (ARMC ONLY)
Chlamydia Tr: NOT DETECTED
N gonorrhoeae: NOT DETECTED

## 2015-02-21 NOTE — ED Provider Notes (Signed)
CSN: [id number]     Arrival date & time 02/20/15  1847 History   First MD Initiated Contact with Patient 02/20/15 2012     Chief Complaint  Patient presents with  . Lower abdominal pressure    HPI  Pt started an OCP about 2 weeks ago, to prevent menstruation while on cruise to celebrate wedding anniversary.  Now with vague discomfort in suprapubic area.  No vag discharge, no bleeding. No dysuria.  No change in BMs.  Some urinary frequency, hesitancy.  No N/V, no change in BMs.    Past Medical History  Diagnosis Date  . Hyperlipidemia   . Hypertension   . Breast mass, right 03/14/08    mammogram and ultrasound  . Wears glasses   . GERD (gastroesophageal reflux disease)    Past Surgical History  Procedure Laterality Date  . Cesarean section  [id number]  . Tsa    . Hernia repair  2006    umb  . Tonsillectomy    . Dilation and curettage of uterus  1998  . Breast lumpectomy with needle localization Left 11/18/2012    Procedure: excise ductal system left breast. subarealar. left partial mastectomy with radiographic guidance;  Surgeon: [name], MD;  Location: [facility];  Service: General;  Laterality: Left;  excise ductal system left breast. subarealar. left partial mastectomy with radiographic guidance  . Breast ductal system excision Left 11/18/2012    Procedure: EXCISION DUCTAL SYSTEM BREAST;  Surgeon: [name], MD;  Location: [facility];  Service: General;  Laterality: Left;  . Breast surgery     Family History  Problem Relation Age of Onset  . Hypertension Mother   . Arthritis Mother     osteoarthritis  . Breast cancer Mother   . Hypertension Father   . Arthritis Father     osteoarthritis  . Cancer Maternal Grandmother     breast   History  Substance Use Topics  . Smoking status: Never Smoker   . Smokeless tobacco: Never Used  . Alcohol Use: No   OB History    No data available     Review of Systems  All other systems  reviewed and are negative.   Allergies  Ferrous sulfate; Penicillins; and Prilosec  Home Medications   Prior to Admission medications   Medication Sig Start Date End Date Taking? Authorizing Provider  lisinopril (PRINIVIL,ZESTRIL) 40 MG tablet Take 1 tablet (40 mg total) by mouth daily. 04/23/14  Yes Abner Greenspan, MD  Norgestimate-Ethinyl Estradiol Triphasic 0.18/0.215/0.25 MG-35 MCG tablet Take 1 tablet by mouth daily. 02/06/15  Yes Abner Greenspan, MD  torsemide (DEMADEX) 20 MG tablet Take 1 tablet (20 mg total) by mouth daily. 04/23/14  Yes Abner Greenspan, MD  Vitamin D, Ergocalciferol, (DRISDOL) 50000 UNITS CAPS capsule Take 1 capsule (50,000 Units total) by mouth every 7 (seven) days. 11/07/14  Yes Marne A Tower, MD   BP 129/82 mmHg  Pulse 66  Temp(Src) 98.5 F (36.9 C) (Oral)  Resp 16  Ht 5\' 5"  (1.651 m)  Wt 167 lb (75.751 kg)  BMI 27.79 kg/m2  SpO2 100%  LMP 01/27/2015 (Exact Date) Physical Exam  Constitutional: She is oriented to person, place, and time. No distress.  Alert, nicely groomed  HENT:  Head: Atraumatic.  Eyes:  Conjugate gaze, no eye redness/drainage  Neck: Neck supple.  Cardiovascular: Normal rate and regular rhythm.   Pulmonary/Chest: No respiratory distress.  Lungs clear, symmetric breath sounds  Abdominal: Soft. She exhibits no distension. There is no rebound and no guarding.  Mild suprapubic tenderness to deep palpation  Genitourinary: Vagina normal.  Slightly atrophic vag mucosal appearance; scant white discharge.  Mild discomfort with movement of cervix and palpation of B adnexae.  No adnexal enlargement.  Nonfocal exam.  Musculoskeletal: Normal range of motion.  No leg swelling  Neurological: She is alert and oriented to person, place, and time.  Skin: Skin is warm and dry.  No cyanosis  Nursing note and vitals reviewed.   ED Course  Procedures (including critical care time) Labs Review Results for orders placed or performed during the hospital  encounter of 02/20/15  Urine culture                          Wet prep, genital  Result Value Ref Range   Yeast Wet Prep HPF POC NONE SEEN NONE SEEN   Trich, Wet Prep NONE SEEN NONE SEEN   Clue Cells Wet Prep HPF POC NONE SEEN NONE SEEN   WBC, Wet Prep HPF POC FEW (A) NONE SEEN  Chlamydia/NGC rt PCR  Result Value Ref Range   Specimen source GC/Chlam STERILE SWAB    Chlamydia Tr NOT DETECTED NOT DETECTED   N gonorrhoeae NOT DETECTED NOT DETECTED  Urinalysis complete, with microscopic  Result Value Ref Range   Color, Urine YELLOW YELLOW   APPearance CLEAR CLEAR   Glucose, UA NEGATIVE NEGATIVE mg/dL   Bilirubin Urine NEGATIVE NEGATIVE   Ketones, ur 1+ (A) NEGATIVE mg/dL   Specific Gravity, Urine 1.025 1.005 - 1.030   Hgb urine dipstick NEGATIVE NEGATIVE   pH 6.0 5.0 - 8.0   Protein, ur PRESENT (A) NEGATIVE mg/dL   Nitrite NEGATIVE NEGATIVE   Leukocytes, UA NEGATIVE NEGATIVE   RBC / HPF 0-5 <3 RBC/hpf   WBC, UA 0-5 <3 WBC/hpf   Bacteria, UA NONE SEEN (A) RARE   Squamous Epithelial / LPF 0-5 (A) RARE    MDM   1. Pelvic pain in female   2. Personal history of ongoing treatment with hormonal therapy   Discomfort seems likely to be related to hormone therapy effect.   Wet prep benign; GC/chlamydia assays pending.  UA benign. Recheck if not improving gradually in next few weeks.    Sherlene Shams, MD 02/21/15 [phone]

## 2015-02-23 LAB — URINE CULTURE

## 2015-03-06 ENCOUNTER — Other Ambulatory Visit: Payer: Self-pay | Admitting: General Surgery

## 2015-03-06 DIAGNOSIS — D242 Benign neoplasm of left breast: Secondary | ICD-10-CM

## 2015-03-18 NOTE — Pre-Procedure Instructions (Signed)
    Rachael Russell  03/18/2015      [facility] [address] [place] [address] Phone: [phone] Fax: [phone]  [place] [place] [address] [place] [address] [place] [address] Phone: [phone] Fax: [phone]    Your procedure is scheduled on 03/27/15.  Report to [facility] Admitting at 10 A.M.  Call this number if you have problems the morning of surgery:  [phone]   Remember:  Do not eat food or drink liquids after midnight.  Take these medicines the morning of surgery with A SIP OF WATER --bcp   Do not wear jewelry, make-up or nail polish.  Do not wear lotions, powders, or perfumes.  You may wear deodorant.  Do not shave 48 hours prior to surgery.  Men may shave face and neck.  Do not bring valuables to the hospital.  [facility] is not responsible for any belongings or valuables.  Contacts, dentures or bridgework may not be worn into surgery.  Leave your suitcase in the car.  After surgery it may be brought to your room.  For patients admitted to the hospital, discharge time will be determined by your treatment team.  Patients discharged the day of surgery will not be allowed to drive home.   Name and phone number of your driver:    Special instructions:    Please read over the following fact sheets that you were given. Pain Booklet, Coughing and Deep Breathing and Surgical Site Infection Prevention

## 2015-03-19 ENCOUNTER — Encounter (HOSPITAL_COMMUNITY)
Admission: RE | Admit: 2015-03-19 | Discharge: 2015-03-19 | Disposition: A | Discharge status: Home / Self Care | Payer: 59 | Source: Ambulatory Visit | Attending: General Surgery | Admitting: General Surgery

## 2015-03-19 ENCOUNTER — Encounter (HOSPITAL_COMMUNITY): Payer: Self-pay

## 2015-03-19 DIAGNOSIS — I10 Essential (primary) hypertension: Secondary | ICD-10-CM | POA: Diagnosis not present

## 2015-03-19 DIAGNOSIS — Z01818 Encounter for other preprocedural examination: Secondary | ICD-10-CM | POA: Diagnosis not present

## 2015-03-19 DIAGNOSIS — Z79899 Other long term (current) drug therapy: Secondary | ICD-10-CM | POA: Diagnosis not present

## 2015-03-19 DIAGNOSIS — Z01812 Encounter for preprocedural laboratory examination: Secondary | ICD-10-CM | POA: Insufficient documentation

## 2015-03-19 DIAGNOSIS — K219 Gastro-esophageal reflux disease without esophagitis: Secondary | ICD-10-CM | POA: Diagnosis not present

## 2015-03-19 DIAGNOSIS — D242 Benign neoplasm of left breast: Secondary | ICD-10-CM | POA: Diagnosis not present

## 2015-03-19 DIAGNOSIS — R9431 Abnormal electrocardiogram [ECG] [EKG]: Secondary | ICD-10-CM | POA: Insufficient documentation

## 2015-03-19 DIAGNOSIS — E785 Hyperlipidemia, unspecified: Secondary | ICD-10-CM | POA: Insufficient documentation

## 2015-03-19 LAB — COMPREHENSIVE METABOLIC PANEL
ALT: 14 U/L (ref 14–54)
AST: 17 U/L (ref 15–41)
Albumin: 3.7 g/dL (ref 3.5–5.0)
Alkaline Phosphatase: 53 U/L (ref 38–126)
Anion gap: 7 (ref 5–15)
BUN: 9 mg/dL (ref 6–20)
CO2: 29 mmol/L (ref 22–32)
Calcium: 9 mg/dL (ref 8.9–10.3)
Chloride: 103 mmol/L (ref 101–111)
Creatinine, Ser: 0.9 mg/dL (ref 0.44–1.00)
GFR calc Af Amer: >60 mL/min (ref >60)
GFR calc non Af Amer: >60 mL/min (ref >60)
Glucose, Bld: 90 mg/dL (ref 65–99)
Potassium: 3.8 mmol/L (ref 3.5–5.1)
Sodium: 139 mmol/L (ref 135–145)
Total Bilirubin: 0.7 mg/dL (ref 0.3–1.2)
Total Protein: 6.9 g/dL (ref 6.5–8.1)

## 2015-03-19 LAB — CBC WITH DIFFERENTIAL/PLATELET
Basophils Absolute: 0 10*3/uL (ref 0.0–0.1)
Basophils Relative: 0 % (ref 0–1)
Eosinophils Absolute: 0.1 10*3/uL (ref 0.0–0.7)
Eosinophils Relative: 1 % (ref 0–5)
HCT: 39.5 % (ref 36.0–46.0)
Hemoglobin: 13.3 g/dL (ref 12.0–15.0)
Lymphocytes Relative: 16 % (ref 12–46)
Lymphs Abs: 1.5 10*3/uL (ref 0.7–4.0)
MCH: 31.6 pg (ref 26.0–34.0)
MCHC: 33.7 g/dL (ref 30.0–36.0)
MCV: 93.8 fL (ref 78.0–100.0)
Monocytes Absolute: 0.6 10*3/uL (ref 0.1–1.0)
Monocytes Relative: 7 % (ref 3–12)
Neutro Abs: 6.8 10*3/uL (ref 1.7–7.7)
Neutrophils Relative %: 76 % (ref 43–77)
Platelets: 306 10*3/uL (ref 150–400)
RBC: 4.21 MIL/uL (ref 3.87–5.11)
RDW: 12.9 % (ref 11.5–15.5)
WBC: 9 10*3/uL (ref 4.0–10.5)

## 2015-03-19 LAB — HCG, SERUM, QUALITATIVE: Preg, Serum: NEGATIVE

## 2015-03-20 NOTE — Progress Notes (Signed)
Anesthesia Chart Review:  Pt is 49 year old female scheduled for L breast lumpectomy x2 with needle localization on 03/27/2015 with Dr. Dalbert Batman.   PMH includes: HTN, hyperlipidemia, GERD. Never smoker. BMI 29.  S/p L breast ductal system excision, L partial mastectomy 11/18/12.  Medications include:  Ferrous sulfate, lisinopril, demadex.   Preoperative labs reviewed.    EKG 03/19/2015: NSR. Cannot rule out Anterior infarct, age undetermined.   If no changes, I anticipate pt can proceed with surgery as scheduled.   Willeen Cass, FNP-BC [facility] Short Stay Surgical Center/Anesthesiology Phone: [phone] 03/20/2015 3:19 PM

## 2015-03-23 IMAGING — CR DG WRIST COMPLETE 3+V*L*
4 series · 4 of 4 positions shown · non-contrast
Comparison: None.

CLINICAL DATA: Wrist injury.

EXAM:
LEFT WRIST - COMPLETE 3+ VIEW

[view not recorded (1 of 4)]
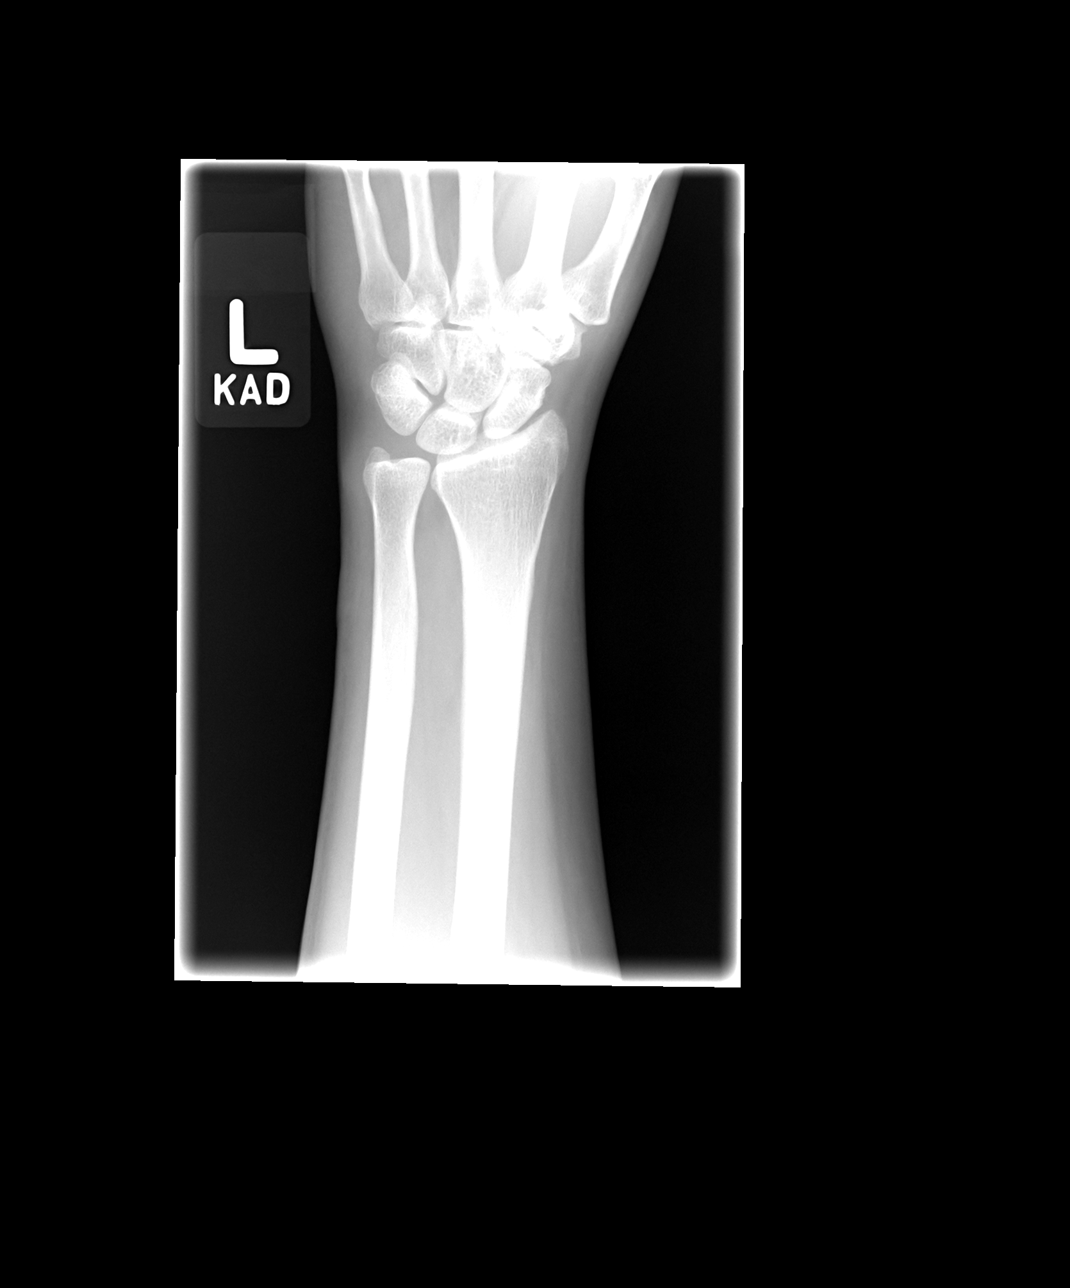

[view not recorded (2 of 4)]
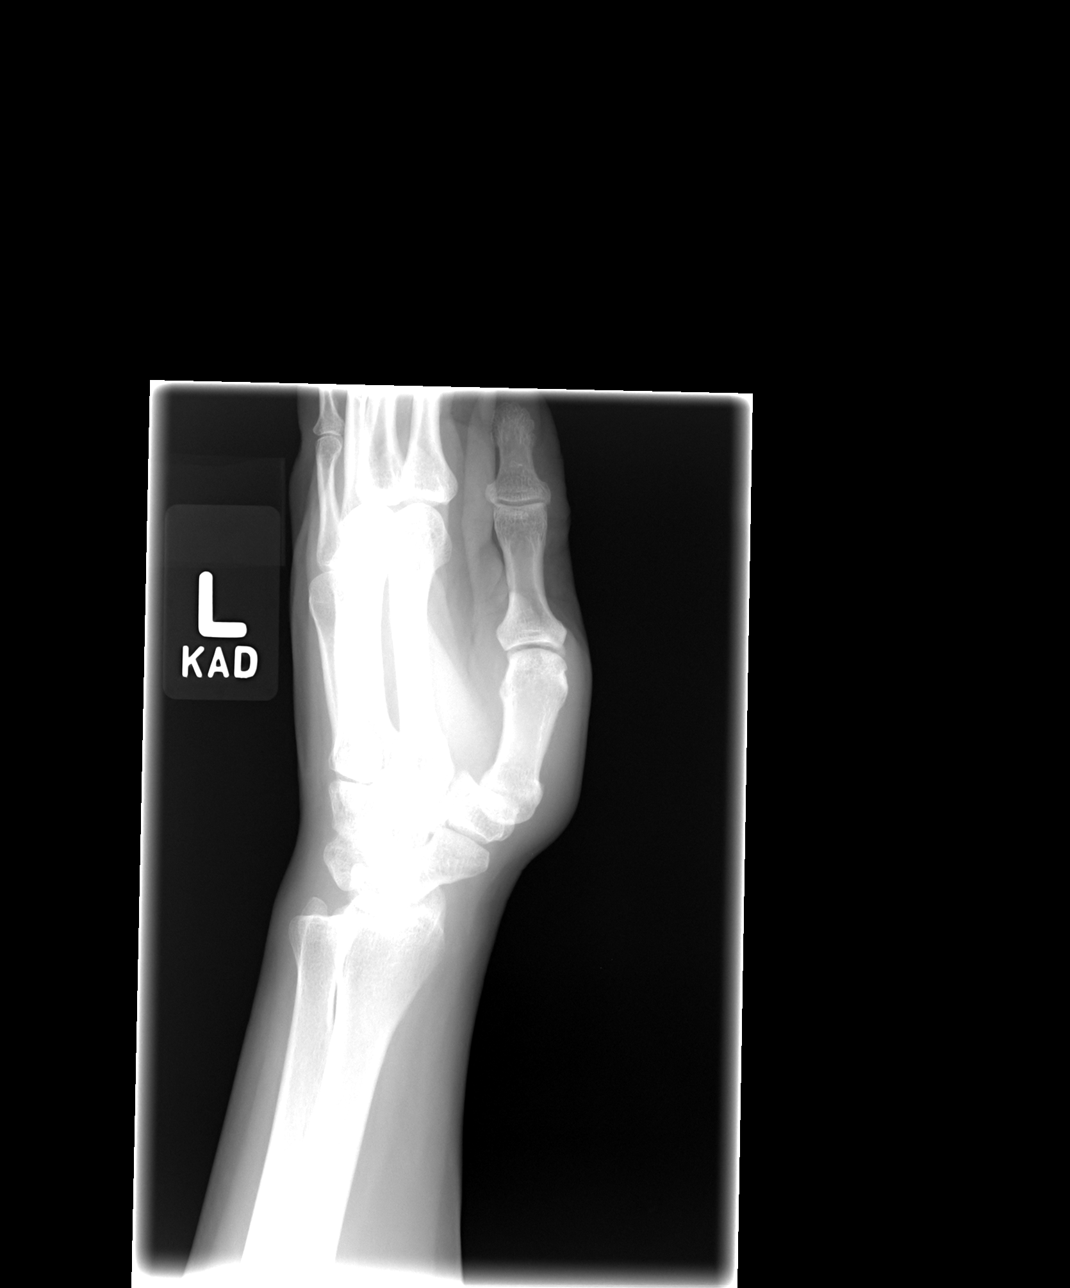

[view not recorded (3 of 4)]
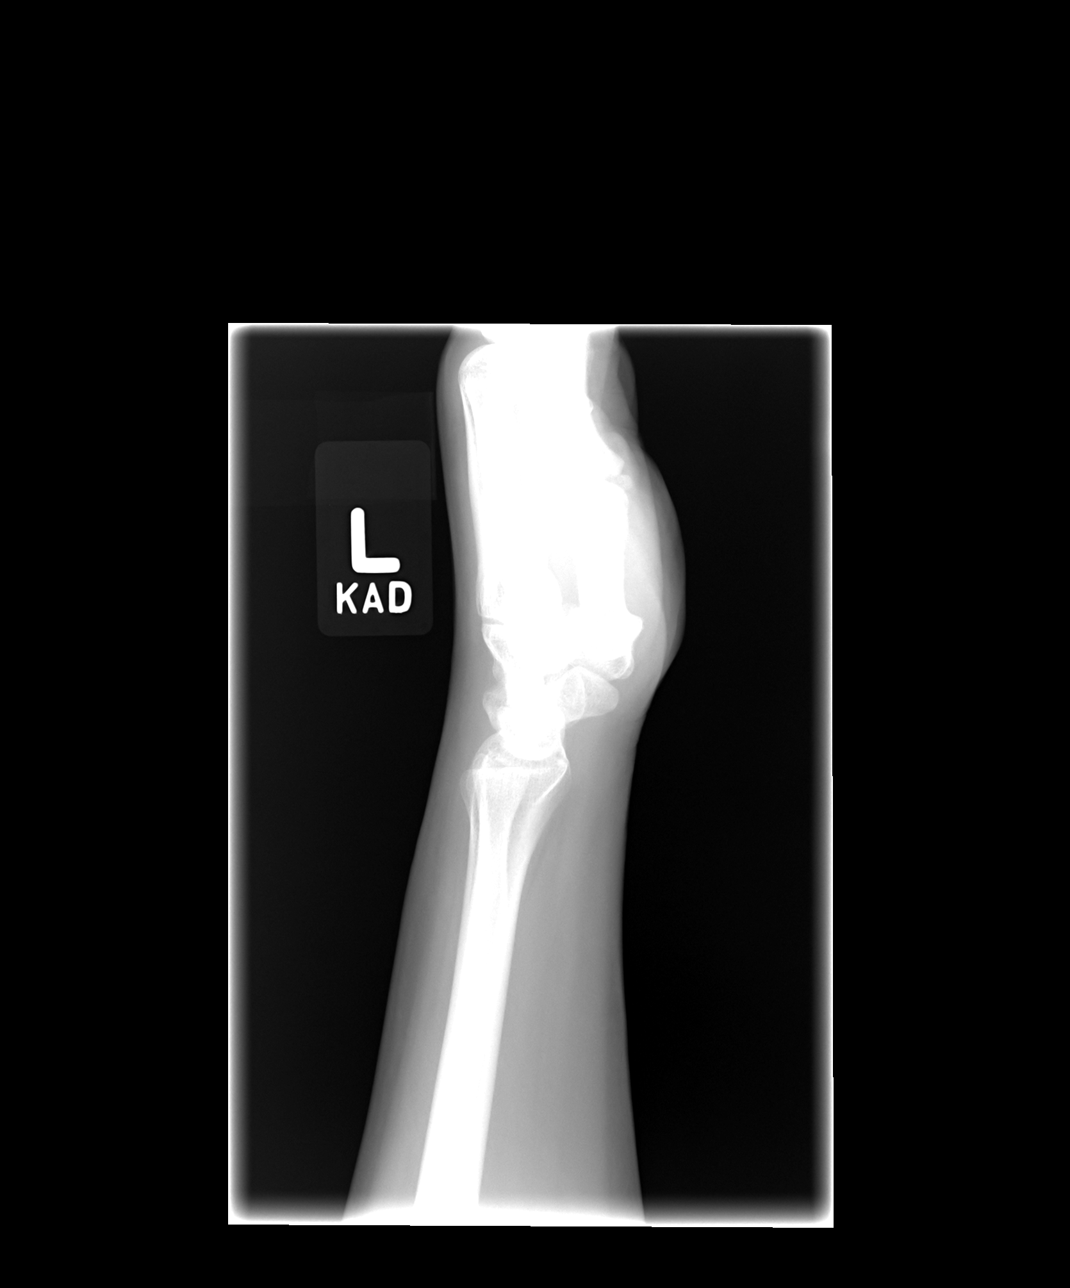

[view not recorded (4 of 4)]
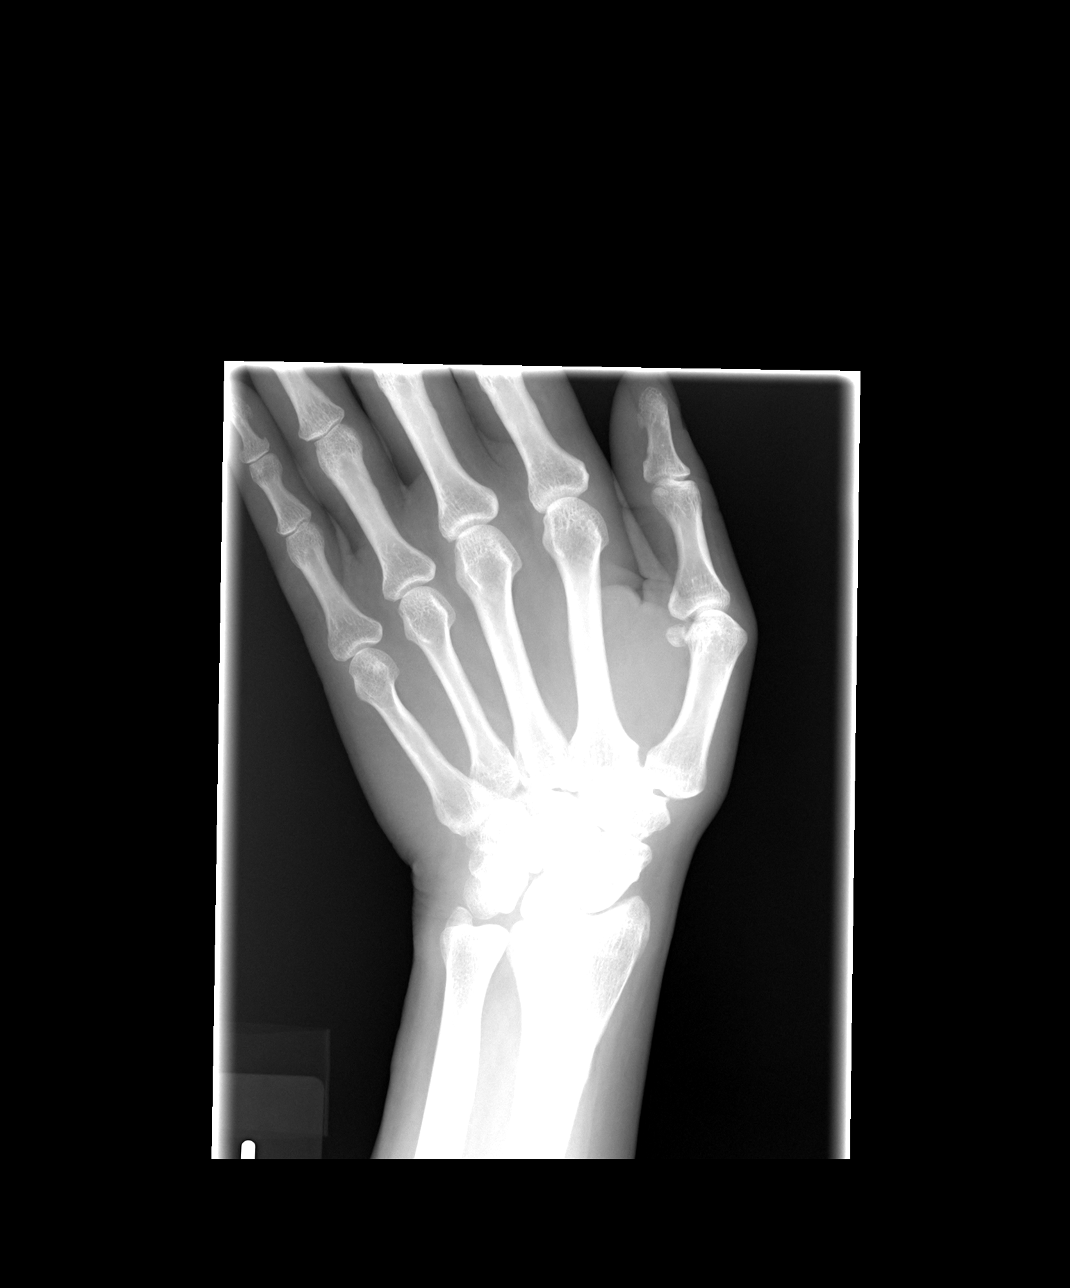

[4 of 4 positions shown; findings below may reference images not displayed]

FINDINGS: There is no evidence of fracture or dislocation. There is no
evidence of arthropathy or other focal bone abnormality. Soft
tissues are unremarkable.
IMPRESSION: Normal exam.

## 2015-03-26 NOTE — H&P (Signed)
Rachael Russell  Location: [facility] Patient #: [id number] DOB: [birth year] Married / Language: English / Race: Black or African American Female        History of Present Illness   The patient is a 49 year old female who presents with a complaint of recurrent intraductal papillomas left breast. This patient returns for management of intraductal papillomas of the left breast. She has had 2 image guided biopsies of the right breast, but these were benign findings. On November 18, 2012 she underwent excision of retroareolar ductal system and a left partial mastectomy in the left breast at the 3 o'clock position, 2 separate incisions. Both showed intraductal papilloma with usual ductal hyperplasia. Her nipple discharge resolved. She healed uneventfully. She noted a single episode of left bloody nipple discharge in December, 2015 but did nothing about it. Recent screening mammograms showed a 0.7 mm mass in the left breast at the areolar margin at 3:00, and a second 0.7 cm mass in the left breast at the 4 o'clock position, middle depth. These were biopsied and showed intraductal papilloma, no atypia. Family history is positive for breast cancer in a mother, maternal grandmother at age 78, and a paternal aunt. Comorbidities include hypertension, GERD, hyperlipidemia. She is actually very healthy. She and her husband live in [place]. Her husband is with her today. We talked a long time about managing the intraductal papillomas. She knows that she is at increased risk. Both she and I would like to go ahead and conservatively excise these to exclude early breast cancer. I told her that it would be a good idea to refer her to the high risk breast cancer center at the cancer center and consider genetic counseling after she recovers from the surgery, and she agrees. We talked a long time about different options for managing her risk. We are going to schedule her for  excision of left breast papillomas 2, with wire localization 2. These areas are too close to put 2 radioactive seeds in. We discussed the indications, details, techniques and numerous risk of the surgery. She is aware of the risk of bleeding, infection, nerve damage, chronic pain or numbness, cosmetic deformity, reoperation for cancer, and other unforeseen problems. She understands these issues well. At this time all of her questions were answered. She agrees with this plan.    Other Problems  Gastroesophageal Reflux Disease Hemorrhoids High blood pressure Hypercholesterolemia Umbilical Hernia Repair  Past Surgical History  Breast Biopsy Bilateral. Cesarean Section - 1 Mastectomy Left. Tonsillectomy  Diagnostic Studies History Colonoscopy never Mammogram within last year Pap Smear 1-5 years ago  Allergies  Ferrous Sulfate *HEMATOPOIETIC AGENTS* Penicillamine *ASSORTED CLASSES* PriLOSEC *ULCER DRUGS*  Medication History  Lisinopril (40MG  Tablet, Oral) Active. Torsemide (20MG  Tablet, Oral) Active. Vitamin D (Ergocalciferol) (50000UNIT Capsule, Oral) Active. Tri-Sprintec (0.18/0.215/0.25MG -35 MCG Tablet, Oral) Active. Medications Reconciled  Social History  Alcohol use Occasional alcohol use. Caffeine use Carbonated beverages, Coffee, Tea. No drug use Tobacco use Never smoker.  Family History  Arthritis Father, Mother. Breast Cancer Mother. Depression Mother. Heart disease in female family member before age 42 Heart disease in female family member before age 54 Hypertension Brother, Father, Mother.  Pregnancy / Birth History  Age at menarche 29 years. Contraceptive History Oral contraceptives. Gravida 3 Maternal age 52-35 Para 2 Regular periods  Review of Systems  General Not Present- Appetite Loss, Chills, Fatigue, Fever, Night Sweats, Weight Gain and Weight Loss. Skin Not Present- Change in Wart/Mole, Dryness, Hives,  Jaundice, New  Lesions, Non-Healing Wounds, Rash and Ulcer. HEENT Present- Wears glasses/contact lenses. Not Present- Earache, Hearing Loss, Hoarseness, Nose Bleed, Oral Ulcers, Ringing in the Ears, Seasonal Allergies, Sinus Pain, Sore Throat, Visual Disturbances and Yellow Eyes. Respiratory Present- Snoring. Not Present- Bloody sputum, Chronic Cough, Difficulty Breathing and Wheezing. Cardiovascular Present- Swelling of Extremities. Not Present- Chest Pain, Difficulty Breathing Lying Down, Leg Cramps, Palpitations, Rapid Heart Rate and Shortness of Breath. Gastrointestinal Not Present- Abdominal Pain, Bloating, Bloody Stool, Change in Bowel Habits, Chronic diarrhea, Constipation, Difficulty Swallowing, Excessive gas, Gets full quickly at meals, Hemorrhoids, Indigestion, Nausea, Rectal Pain and Vomiting. Female Genitourinary Not Present- Frequency, Nocturia, Painful Urination, Pelvic Pain and Urgency. Musculoskeletal Not Present- Back Pain, Joint Pain, Joint Stiffness, Muscle Pain, Muscle Weakness and Swelling of Extremities. Neurological Not Present- Decreased Memory, Fainting, Headaches, Numbness, Seizures, Tingling, Tremor, Trouble walking and Weakness. Psychiatric Not Present- Anxiety, Bipolar, Change in Sleep Pattern, Depression, Fearful and Frequent crying. Endocrine Not Present- Cold Intolerance, Excessive Hunger, Hair Changes, Heat Intolerance, Hot flashes and New Diabetes. Hematology Present- Easy Bruising. Not Present- Excessive bleeding, Gland problems, HIV and Persistent Infections.   Vitals   Weight: 169 lb Height: 65in Body Surface Area: 1.87 m Body Mass Index: 28.12 kg/m Temp.: 97.63F(Temporal)  Pulse: 71 (Regular)  BP: 126/78 (Sitting, Left Arm, Standard)    Physical Exam  General Mental Status-Alert. General Appearance-Consistent with stated age. Hydration-Well hydrated. Voice-Normal.  Head and Neck Head-normocephalic, atraumatic with no lesions  or palpable masses. Trachea-midline. Thyroid Gland Characteristics - normal size and consistency.  Eye Eyeball - Bilateral-Extraocular movements intact. Sclera/Conjunctiva - Bilateral-No scleral icterus.  Chest and Lung Exam Chest and lung exam reveals -quiet, even and easy respiratory effort with no use of accessory muscles and on auscultation, normal breath sounds, no adventitious sounds and normal vocal resonance. Inspection Chest Wall - Normal. Back - normal.  Breast Note: Both breasts are moderate size.Marland Kitchen Diffusely lumpy. No dominant mass. A couple of tiny needle biopsy scars in the right breast is all we see there. The left breast there is a circumareolar incision laterally and there is also a curvilinear incision at about the 3 o'clock position. Recent biopsy sites are noted from needle biopsy. No axillary adenopathy.   Cardiovascular Cardiovascular examination reveals -normal heart sounds, regular rate and rhythm with no murmurs and normal pedal pulses bilaterally.  Abdomen Inspection Inspection of the abdomen reveals - No Hernias. Skin - Scar - no surgical scars. Palpation/Percussion Palpation and Percussion of the abdomen reveal - Soft, Non Tender, No Rebound tenderness, No Rigidity (guarding) and No hepatosplenomegaly. Auscultation Auscultation of the abdomen reveals - Bowel sounds normal.  Neurologic Neurologic evaluation reveals -alert and oriented x 3 with no impairment of recent or remote memory. Mental Status-Normal.  Musculoskeletal Normal Exam - Left-Upper Extremity Strength Normal and Lower Extremity Strength Normal. Normal Exam - Right-Upper Extremity Strength Normal and Lower Extremity Strength Normal.  Lymphatic Head & Neck  General Head & Neck Lymphatics: Bilateral - Description - Normal. Axillary  General Axillary Region: Bilateral - Description - Normal. Tenderness - Non Tender. Femoral & Inguinal  Generalized Femoral & Inguinal  Lymphatics: Bilateral - Description - Normal. Tenderness - Non Tender.    Assessment & Plan   PAPILLOMA OF BREAST, LEFT (217  D24.2) Impression: 2 papillomas left breast. 0.7 cm mass left breast 4 o'clock position middle depth 0.7 cm intraductal mass left breast 3:00 at periareolar margin Both biopsy proven papilloma without atypia   Schedule for Surgery Once again, you have  been diagnosed with intraductal papillomas of the left breast. There is a small biopsy-proven papilloma in the left breast at the 4 o'clock position. There is a second biopsy-proven papilloma in the left breast at the 3 o'clock position at the periareolar margin. You have a family history of breast cancer in 3 relatives. The next step is to conservatively excise these 2 papillomas with double wire localization technique as we described. Hopefully these will be benign. In the near future would like to refer you to the high risk breast clinic at the cancer center, and to consider genetic counseling. We discussed this in detail We've discussed the techniques and risks of the proposed surgery in detail. Please read the written information that I gave you.  Pt Education - Breast Self-Exam *: breast lump  FAMILY HISTORY OF BREAST CANCER (V16.3  Z80.3) Impression: Mother, maternal grandmother, paternal aunt HYPERTENSION, BENIGN (401.1  I10)    Whit Bruni M. Dalbert Batman, M.D., [facility] General and Minimally invasive Surgery Breast and Colorectal Surgery Office:   [phone] Pager:   [phone]

## 2015-03-27 ENCOUNTER — Ambulatory Visit (HOSPITAL_COMMUNITY)
Admission: RE | Admit: 2015-03-27 | Discharge: 2015-03-27 | Disposition: A | Discharge status: Home / Self Care | Payer: 59 | Source: Ambulatory Visit | Attending: General Surgery | Admitting: General Surgery

## 2015-03-27 ENCOUNTER — Ambulatory Visit (HOSPITAL_COMMUNITY): Payer: 59 | Admitting: Anesthesiology

## 2015-03-27 ENCOUNTER — Encounter (HOSPITAL_COMMUNITY)
Admission: RE | Disposition: A | Discharge status: Home / Self Care | Payer: Self-pay | Source: Ambulatory Visit | Attending: General Surgery

## 2015-03-27 ENCOUNTER — Ambulatory Visit (HOSPITAL_COMMUNITY): Payer: 59 | Admitting: Emergency Medicine

## 2015-03-27 ENCOUNTER — Encounter (HOSPITAL_COMMUNITY): Payer: Self-pay | Admitting: *Deleted

## 2015-03-27 DIAGNOSIS — N6012 Diffuse cystic mastopathy of left breast: Secondary | ICD-10-CM

## 2015-03-27 DIAGNOSIS — Z803 Family history of malignant neoplasm of breast: Secondary | ICD-10-CM | POA: Insufficient documentation

## 2015-03-27 DIAGNOSIS — D242 Benign neoplasm of left breast: Secondary | ICD-10-CM | POA: Insufficient documentation

## 2015-03-27 DIAGNOSIS — Z79899 Other long term (current) drug therapy: Secondary | ICD-10-CM | POA: Insufficient documentation

## 2015-03-27 DIAGNOSIS — K219 Gastro-esophageal reflux disease without esophagitis: Secondary | ICD-10-CM | POA: Diagnosis not present

## 2015-03-27 DIAGNOSIS — E780 Pure hypercholesterolemia: Secondary | ICD-10-CM | POA: Diagnosis not present

## 2015-03-27 DIAGNOSIS — I10 Essential (primary) hypertension: Secondary | ICD-10-CM | POA: Insufficient documentation

## 2015-03-27 HISTORY — PX: BREAST LUMPECTOMY WITH NEEDLE LOCALIZATION: SHX5759

## 2015-03-27 SURGERY — BREAST LUMPECTOMY WITH NEEDLE LOCALIZATION
Anesthesia: General | Site: Breast | Laterality: Left

## 2015-03-27 MED ORDER — OXYCODONE HCL 5 MG PO TABS: 5.0000 mg | ORAL_TABLET | ORAL | Status: DC | PRN

## 2015-03-27 MED ORDER — HYDROMORPHONE HCL 1 MG/ML IJ SOLN
INTRAMUSCULAR | Status: AC
  Filled 2015-03-27: qty 1

## 2015-03-27 MED ORDER — CHLORHEXIDINE GLUCONATE 4 % EX LIQD: 1.0000 "application " | Freq: Once | CUTANEOUS | Status: DC

## 2015-03-27 MED ORDER — LIDOCAINE HCL (CARDIAC) 20 MG/ML IV SOLN
INTRAVENOUS | Status: AC
  Filled 2015-03-27: qty 10

## 2015-03-27 MED ORDER — 0.9 % SODIUM CHLORIDE (POUR BTL) OPTIME
TOPICAL | Status: DC | PRN
  Administered 2015-03-27: 1000 mL

## 2015-03-27 MED ORDER — SODIUM CHLORIDE 0.9 % IJ SOLN: 3.0000 mL | Freq: Two times a day (BID) | INTRAMUSCULAR | Status: DC

## 2015-03-27 MED ORDER — LACTATED RINGERS IV SOLN
INTRAVENOUS | Status: DC | PRN
  Administered 2015-03-27: 12:00:00 via INTRAVENOUS

## 2015-03-27 MED ORDER — MEPERIDINE HCL 25 MG/ML IJ SOLN: 6.2500 mg | INTRAMUSCULAR | Status: DC | PRN

## 2015-03-27 MED ORDER — HYDROCODONE-ACETAMINOPHEN 5-325 MG PO TABS
ORAL_TABLET | ORAL | Status: AC
  Filled 2015-03-27: qty 2

## 2015-03-27 MED ORDER — BUPIVACAINE-EPINEPHRINE 0.25% -1:200000 IJ SOLN
INTRAMUSCULAR | Status: DC | PRN
  Administered 2015-03-27: 10 mL

## 2015-03-27 MED ORDER — DIPHENHYDRAMINE HCL 50 MG/ML IJ SOLN
INTRAMUSCULAR | Status: AC
  Filled 2015-03-27: qty 1

## 2015-03-27 MED ORDER — ACETAMINOPHEN 500 MG PO TABS
ORAL_TABLET | ORAL | Status: AC
  Filled 2015-03-27: qty 1

## 2015-03-27 MED ORDER — MIDAZOLAM HCL 5 MG/5ML IJ SOLN
INTRAMUSCULAR | Status: DC | PRN
  Administered 2015-03-27: 2 mg via INTRAVENOUS

## 2015-03-27 MED ORDER — SUCCINYLCHOLINE CHLORIDE 20 MG/ML IJ SOLN
INTRAMUSCULAR | Status: DC | PRN
  Administered 2015-03-27: 100 mg via INTRAVENOUS

## 2015-03-27 MED ORDER — PROPOFOL 10 MG/ML IV BOLUS
INTRAVENOUS | Status: DC | PRN
  Administered 2015-03-27: 200 mg via INTRAVENOUS

## 2015-03-27 MED ORDER — LIDOCAINE HCL (CARDIAC) 20 MG/ML IV SOLN
INTRAVENOUS | Status: DC | PRN
  Administered 2015-03-27: 75 mg via INTRAVENOUS

## 2015-03-27 MED ORDER — GLYCOPYRROLATE 0.2 MG/ML IJ SOLN
INTRAMUSCULAR | Status: AC
  Filled 2015-03-27: qty 1

## 2015-03-27 MED ORDER — PROPOFOL 10 MG/ML IV BOLUS
INTRAVENOUS | Status: AC
  Filled 2015-03-27: qty 20

## 2015-03-27 MED ORDER — BUPIVACAINE-EPINEPHRINE (PF) 0.25% -1:200000 IJ SOLN
INTRAMUSCULAR | Status: AC
  Filled 2015-03-27: qty 30

## 2015-03-27 MED ORDER — SODIUM CHLORIDE 0.9 % IJ SOLN: 3.0000 mL | INTRAMUSCULAR | Status: DC | PRN

## 2015-03-27 MED ORDER — SODIUM CHLORIDE 0.9 % IV SOLN: 250.0000 mL | INTRAVENOUS | Status: DC | PRN

## 2015-03-27 MED ORDER — FENTANYL CITRATE (PF) 100 MCG/2ML IJ SOLN
INTRAMUSCULAR | Status: DC | PRN
  Administered 2015-03-27: 50 ug via INTRAVENOUS
  Administered 2015-03-27: 100 ug via INTRAVENOUS
  Administered 2015-03-27 (×4): 50 ug via INTRAVENOUS

## 2015-03-27 MED ORDER — ACETAMINOPHEN 160 MG/5ML PO SOLN
960.0000 mg | Freq: Once | ORAL | Status: DC
  Filled 2015-03-27: qty 30

## 2015-03-27 MED ORDER — HYDROCODONE-ACETAMINOPHEN 5-325 MG PO TABS
2.0000 | ORAL_TABLET | Freq: Once | ORAL | Status: AC
  Administered 2015-03-27: 2 via ORAL

## 2015-03-27 MED ORDER — ACETAMINOPHEN 650 MG RE SUPP: 650.0000 mg | RECTAL | Status: DC | PRN

## 2015-03-27 MED ORDER — MIDAZOLAM HCL 2 MG/2ML IJ SOLN
INTRAMUSCULAR | Status: AC
  Filled 2015-03-27: qty 2

## 2015-03-27 MED ORDER — FENTANYL CITRATE (PF) 250 MCG/5ML IJ SOLN
INTRAMUSCULAR | Status: AC
  Filled 2015-03-27: qty 5

## 2015-03-27 MED ORDER — SODIUM CHLORIDE 0.9 % IV SOLN: INTRAVENOUS | Status: DC

## 2015-03-27 MED ORDER — ONDANSETRON HCL 4 MG/2ML IJ SOLN
INTRAMUSCULAR | Status: AC
  Filled 2015-03-27: qty 2

## 2015-03-27 MED ORDER — DIPHENHYDRAMINE HCL 50 MG/ML IJ SOLN
INTRAMUSCULAR | Status: DC | PRN
  Administered 2015-03-27: 25 mg via INTRAVENOUS

## 2015-03-27 MED ORDER — ACETAMINOPHEN 10 MG/ML IV SOLN
1000.0000 mg | INTRAVENOUS | Status: AC
  Administered 2015-03-27: 1000 mg via INTRAVENOUS
  Filled 2015-03-27: qty 100

## 2015-03-27 MED ORDER — FAMOTIDINE 20 MG PO TABS: 20.0000 mg | ORAL_TABLET | Freq: Once | ORAL | Status: DC

## 2015-03-27 MED ORDER — PROMETHAZINE HCL 25 MG/ML IJ SOLN
6.2500 mg | INTRAMUSCULAR | Status: DC | PRN
Start: 2015-03-27 — End: 2015-03-27

## 2015-03-27 MED ORDER — FENTANYL CITRATE (PF) 100 MCG/2ML IJ SOLN: 25.0000 ug | INTRAMUSCULAR | Status: DC | PRN

## 2015-03-27 MED ORDER — HYDROCODONE-ACETAMINOPHEN 5-325 MG PO TABS: 1.0000 | ORAL_TABLET | Freq: Four times a day (QID) | ORAL | Status: DC | PRN

## 2015-03-27 MED ORDER — ACETAMINOPHEN 325 MG PO TABS: 650.0000 mg | ORAL_TABLET | ORAL | Status: DC | PRN

## 2015-03-27 MED ORDER — LACTATED RINGERS IV SOLN
INTRAVENOUS | Status: DC
  Administered 2015-03-27: 12:00:00 via INTRAVENOUS

## 2015-03-27 MED ORDER — LACTATED RINGERS IV SOLN: INTRAVENOUS | Status: DC

## 2015-03-27 MED ORDER — PHENYLEPHRINE 40 MCG/ML (10ML) SYRINGE FOR IV PUSH (FOR BLOOD PRESSURE SUPPORT)
PREFILLED_SYRINGE | INTRAVENOUS | Status: AC
  Filled 2015-03-27: qty 10

## 2015-03-27 MED ORDER — ACETAMINOPHEN 500 MG PO TABS
1000.0000 mg | ORAL_TABLET | Freq: Once | ORAL | Status: DC
Start: 2015-03-27 — End: 2015-03-27

## 2015-03-27 MED ORDER — SUCCINYLCHOLINE CHLORIDE 20 MG/ML IJ SOLN
INTRAMUSCULAR | Status: AC
  Filled 2015-03-27: qty 1

## 2015-03-27 MED ORDER — HYDROMORPHONE HCL 1 MG/ML IJ SOLN
0.2500 mg | INTRAMUSCULAR | Status: DC | PRN
  Administered 2015-03-27: 0.5 mg via INTRAVENOUS
  Administered 2015-03-27 (×2): 0.25 mg via INTRAVENOUS

## 2015-03-27 MED ORDER — VANCOMYCIN HCL 10 G IV SOLR
1500.0000 mg | INTRAVENOUS | Status: AC
  Administered 2015-03-27: 1500 mg via INTRAVENOUS
  Filled 2015-03-27 (×2): qty 1500

## 2015-03-27 SURGICAL SUPPLY — 47 items
ADH SKN CLS APL DERMABOND .7 (GAUZE/BANDAGES/DRESSINGS) ×1
APPLIER CLIP 9.375 MED OPEN (MISCELLANEOUS)
APR CLP MED 9.3 20 MLT OPN (MISCELLANEOUS)
BINDER BREAST LRG (GAUZE/BANDAGES/DRESSINGS) IMPLANT
BINDER BREAST XLRG (GAUZE/BANDAGES/DRESSINGS) ×1 IMPLANT
CANISTER SUCTION 2500CC (MISCELLANEOUS) ×2 IMPLANT
CHLORAPREP W/TINT 26ML (MISCELLANEOUS) ×2 IMPLANT
CLIP APPLIE 9.375 MED OPEN (MISCELLANEOUS) IMPLANT
COVER SURGICAL LIGHT HANDLE (MISCELLANEOUS) ×2 IMPLANT
DECANTER SPIKE VIAL GLASS SM (MISCELLANEOUS) ×1 IMPLANT
DERMABOND ADVANCED (GAUZE/BANDAGES/DRESSINGS) ×1
DERMABOND ADVANCED .7 DNX12 (GAUZE/BANDAGES/DRESSINGS) ×1 IMPLANT
DEVICE DUBIN SPECIMEN MAMMOGRA (MISCELLANEOUS) ×2 IMPLANT
DRAPE LAPAROSCOPIC ABDOMINAL (DRAPES) ×1 IMPLANT
DRAPE UTILITY XL STRL (DRAPES) ×2 IMPLANT
ELECT CAUTERY BLADE 6.4 (BLADE) ×2 IMPLANT
ELECT REM PT RETURN 9FT ADLT (ELECTROSURGICAL) ×2
ELECTRODE REM PT RTRN 9FT ADLT (ELECTROSURGICAL) ×1 IMPLANT
GAUZE SPONGE 4X4 12PLY STRL (GAUZE/BANDAGES/DRESSINGS) ×1 IMPLANT
GLOVE BIOGEL PI IND STRL 7.0 (GLOVE) IMPLANT
GLOVE BIOGEL PI INDICATOR 7.0 (GLOVE) ×1
GLOVE EUDERMIC 7 POWDERFREE (GLOVE) ×2 IMPLANT
GLOVE SURG SS PI 6.5 STRL IVOR (GLOVE) ×1 IMPLANT
GLOVE SURG SS PI 7.0 STRL IVOR (GLOVE) ×1 IMPLANT
GLOVE SURG SS PI 7.5 STRL IVOR (GLOVE) ×1 IMPLANT
GOWN STRL REUS W/ TWL LRG LVL3 (GOWN DISPOSABLE) ×1 IMPLANT
GOWN STRL REUS W/ TWL XL LVL3 (GOWN DISPOSABLE) ×1 IMPLANT
GOWN STRL REUS W/TWL LRG LVL3 (GOWN DISPOSABLE) ×4
GOWN STRL REUS W/TWL XL LVL3 (GOWN DISPOSABLE) ×2
KIT BASIN OR (CUSTOM PROCEDURE TRAY) ×2 IMPLANT
KIT MARKER MARGIN INK (KITS) IMPLANT
KIT ROOM TURNOVER OR (KITS) ×2 IMPLANT
NDL HYPO 25GX1X1/2 BEV (NEEDLE) ×1 IMPLANT
NEEDLE HYPO 25GX1X1/2 BEV (NEEDLE) ×2 IMPLANT
NS IRRIG 1000ML POUR BTL (IV SOLUTION) ×2 IMPLANT
PACK GENERAL/GYN (CUSTOM PROCEDURE TRAY) ×2 IMPLANT
PAD ABD 8X10 STRL (GAUZE/BANDAGES/DRESSINGS) ×1 IMPLANT
PAD ARMBOARD 7.5X6 YLW CONV (MISCELLANEOUS) ×4 IMPLANT
SPONGE LAP 4X18 X RAY DECT (DISPOSABLE) IMPLANT
STAPLER VISISTAT 35W (STAPLE) ×1 IMPLANT
SUT MNCRL AB 4-0 PS2 18 (SUTURE) ×2 IMPLANT
SUT SILK 2 0 SH (SUTURE) IMPLANT
SUT VIC AB 3-0 SH 18 (SUTURE) ×2 IMPLANT
SUT VICRYL AB 3 0 TIES (SUTURE) IMPLANT
SYR CONTROL 10ML LL (SYRINGE) ×2 IMPLANT
TOWEL OR 17X24 6PK STRL BLUE (TOWEL DISPOSABLE) ×1 IMPLANT
TOWEL OR 17X26 10 PK STRL BLUE (TOWEL DISPOSABLE) ×2 IMPLANT

## 2015-03-27 NOTE — Interval H&P Note (Signed)
History and Physical Interval Note:  03/27/2015 11:31 AM  Rachael Russell  has presented today for surgery, with the diagnosis of multiple papillomas left breast  The various methods of treatment have been discussed with the patient and family. After consideration of risks, benefits and other options for treatment, the patient has consented to  Procedure(s): LEFT BREAST LUMPECTOMY X 2 WITH NEEDLE LOCALIZATION X2 (Left) as a surgical intervention .  The patient's history has been reviewed, patient examined, no change in status, stable for surgery.  I have reviewed the patient's chart and labs.  Questions were answered to the patient's satisfaction.     [name]

## 2015-03-27 NOTE — Anesthesia Postprocedure Evaluation (Signed)
  Anesthesia Post-op Note  Patient: Rachael Russell  Procedure(s) Performed: Procedure(s): LEFT BREAST LUMPECTOMY  WITH NEEDLE LOCALIZATION TIMES TWO (Left)  Patient Location: PACU  Anesthesia Type:General  Level of Consciousness: awake, alert  and oriented  Airway and Oxygen Therapy: Patient Spontanous Breathing  Post-op Pain: 5 /10, administering PO pain meds prior to discharge.  Post-op Assessment: Post-op Vital signs reviewed,               Post-op Vital Signs: Reviewed  Last Vitals:  Filed Vitals:   03/27/15 1500  BP: 139/101  Pulse: 61  Temp:   Resp: 10    Complications: No apparent anesthesia complications

## 2015-03-27 NOTE — Anesthesia Procedure Notes (Signed)
Procedure Name: LMA Insertion Date/Time: 03/27/2015 12:29 PM Performed by: Eligha Bridegroom Pre-anesthesia Checklist: Patient identified, Timeout performed, Emergency Drugs available, Suction available and Patient being monitored Patient Re-evaluated:Patient Re-evaluated prior to inductionOxygen Delivery Method: Circle system utilized Preoxygenation: Pre-oxygenation with 100% oxygen Intubation Type: IV induction Ventilation: Mask ventilation without difficulty LMA: LMA inserted LMA Size: 5.0 Number of attempts: 2 Placement Confirmation: positive ETCO2 and breath sounds checked- equal and bilateral Tube secured with: Tape Dental Injury: Teeth and Oropharynx as per pre-operative assessment

## 2015-03-27 NOTE — Transfer of Care (Signed)
Immediate Anesthesia Transfer of Care Note  Patient: Rachael Russell  Procedure(s) Performed: Procedure(s): LEFT BREAST LUMPECTOMY  WITH NEEDLE LOCALIZATION TIMES TWO (Left)  Patient Location: PACU  Anesthesia Type:General  Level of Consciousness: awake, alert  and oriented  Airway & Oxygen Therapy: Patient Spontanous Breathing and Patient connected to nasal cannula oxygen  Post-op Assessment: Report given to RN and Post -op Vital signs reviewed and stable  Post vital signs: Reviewed and stable  Last Vitals:  Filed Vitals:   03/27/15 1352  BP: 141/97  Pulse: 71  Temp: 36.6 C  Resp: 17    Complications: No apparent anesthesia complications

## 2015-03-27 NOTE — Anesthesia Preprocedure Evaluation (Addendum)
Anesthesia Evaluation  Patient identified by MRN, date of birth, ID band Patient awake    Reviewed: Allergy & Precautions, NPO status , Patient's Chart, lab work & pertinent test results  Airway Mallampati: III  TM Distance: >3 FB Neck ROM: Full    Dental  (+) Teeth Intact   Pulmonary  breath sounds clear to auscultation        Cardiovascular hypertension, Pt. on medications Rhythm:Regular Rate:Normal     Neuro/Psych Anxiety    GI/Hepatic GERD-  Medicated and Controlled,GERD is diet related only, no symptoms at present or with laying down.    Endo/Other    Renal/GU      Musculoskeletal   Abdominal   Peds  Hematology   Anesthesia Other Findings   Reproductive/Obstetrics                            Lab Results  Component Value Date   WBC 9.0 03/19/2015   HGB 13.3 03/19/2015   HCT 39.5 03/19/2015   MCV 93.8 03/19/2015   PLT 306 03/19/2015   Lab Results  Component Value Date   CREATININE 0.90 03/19/2015   BUN 9 03/19/2015   NA 139 03/19/2015   K 3.8 03/19/2015   CL 103 03/19/2015   CO2 29 03/19/2015   EKG: normal EKG, normal sinus rhythm.  Anesthesia Physical Anesthesia Plan  ASA: II  Anesthesia Plan: General   Post-op Pain Management:    Induction: Intravenous  Airway Management Planned: LMA  Additional Equipment:   Intra-op Plan:   Post-operative Plan:   Informed Consent: I have reviewed the patients History and Physical, chart, labs and discussed the procedure including the risks, benefits and alternatives for the proposed anesthesia with the patient or authorized representative who has indicated his/her understanding and acceptance.   Dental advisory given  Plan Discussed with: CRNA  Anesthesia Plan Comments: (Anesthetic plan discussed in detail. Associated risk discussed including but not limited to life threatening cardiovascular, pulmonary events and dental  damage. The postoperative pain management and antiemetic plan discussed with patient. All questions answered in detail. Patient is in agreement.   )       Anesthesia Quick Evaluation

## 2015-03-27 NOTE — Addendum Note (Signed)
Addendum  created 03/27/15 1949 by Eligha Bridegroom, CRNA   Modules edited: Anesthesia Events, Narrator   Narrator:  Narrator: Event Log Edited

## 2015-03-27 NOTE — Discharge Instructions (Signed)
[facility] °Office Phone Number [phone] ° °BREAST BIOPSY/ PARTIAL MASTECTOMY: POST OP INSTRUCTIONS ° °Always review your discharge instruction sheet given to you by the facility where your surgery was performed. ° °IF YOU HAVE DISABILITY OR FAMILY LEAVE FORMS, YOU MUST BRING THEM TO THE OFFICE FOR PROCESSING.  DO NOT GIVE THEM TO YOUR DOCTOR. ° °1. A prescription for pain medication may be given to you upon discharge.  Take your pain medication as prescribed, if needed.  If narcotic pain medicine is not needed, then you may take acetaminophen (Tylenol) or ibuprofen (Advil) as needed. °2. Take your usually prescribed medications unless otherwise directed °3. If you need a refill on your pain medication, please contact your pharmacy.  They will contact our office to request authorization.  Prescriptions will not be filled after 5pm or on week-ends. °4. You should eat very light the first 24 hours after surgery, such as soup, crackers, pudding, etc.  Resume your normal diet the day after surgery. °5. Most patients will experience some swelling and bruising in the breast.  Ice packs and a good support bra will help.  Swelling and bruising can take several days to resolve.  °6. It is common to experience some constipation if taking pain medication after surgery.  Increasing fluid intake and taking a stool softener will usually help or prevent this problem from occurring.  A mild laxative (Milk of Magnesia or Miralax) should be taken according to package directions if there are no bowel movements after 48 hours. °7. Unless discharge instructions indicate otherwise, you may remove your bandages 24-48 hours after surgery, and you may shower at that time.  You may have steri-strips (small skin tapes) in place directly over the incision.  These strips should be left on the skin for 7-10 days.  If your surgeon used skin glue on the incision, you may shower in 24 hours.  The glue will flake off over the  next 2-3 weeks.  Any sutures or staples will be removed at the office during your follow-up visit. °8. ACTIVITIES:  You may resume regular daily activities (gradually increasing) beginning the next day.  Wearing a good support bra or sports bra minimizes pain and swelling.  You may have sexual intercourse when it is comfortable. °a. You may drive when you no longer are taking prescription pain medication, you can comfortably wear a seatbelt, and you can safely maneuver your car and apply brakes. °b. RETURN TO WORK:  ______________________________________________________________________________________ °9. You should see your doctor in the office for a follow-up appointment approximately two weeks after your surgery.  Your doctor’s nurse will typically make your follow-up appointment when she calls you with your pathology report.  Expect your pathology report 2-3 business days after your surgery.  You may call to check if you do not hear from us after three days. °10. OTHER INSTRUCTIONS: _______________________________________________________________________________________________ _____________________________________________________________________________________________________________________________________ °_____________________________________________________________________________________________________________________________________ °_____________________________________________________________________________________________________________________________________ ° °WHEN TO CALL YOUR DOCTOR: °1. Fever over 101.0 °2. Nausea and/or vomiting. °3. Extreme swelling or bruising. °4. Continued bleeding from incision. °5. Increased pain, redness, or drainage from the incision. ° °The clinic staff is available to answer your questions during regular business hours.  Please don’t hesitate to call and ask to speak to one of the nurses for clinical concerns.  If you have a medical emergency, go to the nearest  emergency room or call 911.  A surgeon from [facility] is always on call at the hospital. ° °For further questions, please visit [email or web address]  °

## 2015-03-27 NOTE — Op Note (Signed)
Patient Name:           Rachael Russell   Date of Surgery:        03/27/2015  Pre op Diagnosis:      Fibroadenoma left breast subareolar                                      Fibroadenoma left breast, lower outer quadrant  Post op Diagnosis:    Same  Procedure:                 Excision left breast mass, subareolar with wire localization                                      Excision left breast mass, lower outer quadrant, with wire localization  Surgeon:                     Edsel Petrin. 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 She has had 2 image guided biopsies of the right breast, but these were benign findings. On November 18, 2012 she underwent excision of retroareolar ductal system and a left partial mastectomy in the left breast at the 3 o'clock position, 2 separate incisions. Both showed intraductal papilloma with usual ductal hyperplasia. Her nipple discharge resolved. She healed uneventfully. She noted a single episode of left bloody nipple discharge in December, 2015 but did nothing about it. Recent screening mammograms showed a 0.7 mm mass in the left breast at the areolar margin at 3:00, and a second 0.7 cm mass in the left breast at the 4 o'clock position, middle depth. These were biopsied and showed intraductal papilloma, no atypia. Family history is positive for breast cancer in a mother, maternal grandmother at age 44, and a paternal aunt. Comorbidities include hypertension, GERD, hyperlipidemia. She is actually very healthy.  We talked a long time about managing the intraductal papillomas. She knows that she is at increased risk. Both she and I would like to go ahead and conservatively excise these to exclude early breast cancer. I told her that it would be a good idea to refer her to the high risk breast cancer center at the cancer  center and consider genetic counseling after she recovers from the surgery, and she agrees. We talked a long time about different options for managing her risk. We are going to schedule her for excision of left breast papillomas 2, with wire localization 2. These areas are too close to put 2 radioactive seeds in.   Operative Findings:       The subareolar and the lower outer quadrant wire and marker clips were completely excised and visible on the specimen mammogram.  2 separate incisions were used.  The specimens were sent to pathology  Procedure in Detail:          The patient underwent wire localization by Dr. Marcelo Baldy at [facility] imaging this morning.  Both wires seemed to be appropriately placed.  One wire was at the left areolar margin inferiorly and one wire was in the left breast at about the 5:00 position.  Patient underwent general endotracheal anesthesia.  Surgical timeout was performed.  Intravenous antibiotic's were given.  The breast was prepped and draped in a sterile fashion.  0.5% Marcaine with epinephrine was used as a local infiltration anesthetic.     A circumareolar incision was made in the left breast at the inferior areolar margin.  I dissected out the wire and did a specimen mammogram which looked good.  I then made a more inferiorly placed circumareolar incision through the wire insertion site at 5:00 position.  I dissected out the wire.  The specimen also contained the wire and the marker clip and we were satisfied with the excision.  Hemostasis was excellent and achieved with electrocautery.  The deeper breast tissues were reapproximated with multiple interrupted sutures of 3-0 Vicryl and both skin incisions were closed separately with running sutures of 4-0 Monocryl and Dermabond.  Breast binder and ice pack were placed.  The patient tolerated procedure well was taken to PACU in stable condition.  EBL 20 mL or less.  Counts correct.  Complications none.     Edsel Petrin.  Dalbert Batman, M.D., FACS General and Minimally Invasive Surgery Breast and Colorectal Surgery  03/27/2015 1:26 PM

## 2015-03-28 ENCOUNTER — Encounter (HOSPITAL_COMMUNITY): Payer: Self-pay | Admitting: General Surgery

## 2015-03-29 ENCOUNTER — Other Ambulatory Visit: Payer: Self-pay | Admitting: Family Medicine

## 2015-03-29 ENCOUNTER — Encounter: Payer: Self-pay | Admitting: Family Medicine

## 2015-03-31 NOTE — Progress Notes (Signed)
Quick Note:  Inform patient of Pathology report,. Tell her that both biopsy areas are benign papillomas, as expected. Will discuss in detail; at next [place] ______

## 2015-04-16 ENCOUNTER — Telehealth: Payer: Self-pay | Admitting: Family Medicine

## 2015-04-16 DIAGNOSIS — Z0000 Encounter for general adult medical examination without abnormal findings: Secondary | ICD-10-CM

## 2015-04-16 NOTE — Telephone Encounter (Signed)
-----   Message from Ellamae Sia sent at 04/10/2015 12:07 PM EDT ----- Regarding: Lab orders for Thursdday, 8.25.16 Patient is scheduled for CPX labs, please order future labs, Thanks , Karna Christmas

## 2015-04-19 ENCOUNTER — Other Ambulatory Visit (INDEPENDENT_AMBULATORY_CARE_PROVIDER_SITE_OTHER): Payer: 59

## 2015-04-19 DIAGNOSIS — Z0000 Encounter for general adult medical examination without abnormal findings: Secondary | ICD-10-CM

## 2015-04-20 LAB — COMPREHENSIVE METABOLIC PANEL
ALT: 12 IU/L (ref 0–32)
AST: 16 IU/L (ref 0–40)
Albumin/Globulin Ratio: 1.8 (ref 1.1–2.5)
Albumin: 4.1 g/dL (ref 3.5–5.5)
Alkaline Phosphatase: 56 IU/L (ref 39–117)
BUN/Creatinine Ratio: 19 (ref 9–23)
BUN: 16 mg/dL (ref 6–24)
Bilirubin Total: <0.2 mg/dL (ref 0.0–1.2)
CO2: 24 mmol/L (ref 18–29)
Calcium: 9.2 mg/dL (ref 8.7–10.2)
Chloride: 103 mmol/L (ref 97–108)
Creatinine, Ser: 0.84 mg/dL (ref 0.57–1.00)
GFR calc Af Amer: 94 mL/min/{1.73_m2} (ref >59)
GFR calc non Af Amer: 82 mL/min/{1.73_m2} (ref >59)
Globulin, Total: 2.3 g/dL (ref 1.5–4.5)
Glucose: 92 mg/dL (ref 65–99)
Potassium: 4.4 mmol/L (ref 3.5–5.2)
Sodium: 140 mmol/L (ref 134–144)
Total Protein: 6.4 g/dL (ref 6.0–8.5)

## 2015-04-20 LAB — LIPID PANEL
Chol/HDL Ratio: 3.3 ratio units (ref 0.0–4.4)
Cholesterol, Total: 237 mg/dL — ABNORMAL HIGH (ref 100–199)
HDL: 71 mg/dL (ref >39)
LDL Calculated: 152 mg/dL — ABNORMAL HIGH (ref 0–99)
Triglycerides: 69 mg/dL (ref 0–149)
VLDL Cholesterol Cal: 14 mg/dL (ref 5–40)

## 2015-04-20 LAB — CBC WITH DIFFERENTIAL/PLATELET
Basophils Absolute: 0 10*3/uL (ref 0.0–0.2)
Basos: 0 %
EOS (ABSOLUTE): 0.1 10*3/uL (ref 0.0–0.4)
Eos: 1 %
Hematocrit: 37.4 % (ref 34.0–46.6)
Hemoglobin: 12.6 g/dL (ref 11.1–15.9)
Immature Grans (Abs): 0 10*3/uL (ref 0.0–0.1)
Immature Granulocytes: 0 %
Lymphocytes Absolute: 1.9 10*3/uL (ref 0.7–3.1)
Lymphs: 26 %
MCH: 31 pg (ref 26.6–33.0)
MCHC: 33.7 g/dL (ref 31.5–35.7)
MCV: 92 fL (ref 79–97)
Monocytes Absolute: 0.5 10*3/uL (ref 0.1–0.9)
Monocytes: 7 %
Neutrophils Absolute: 4.8 10*3/uL (ref 1.4–7.0)
Neutrophils: 66 %
Platelets: 348 10*3/uL (ref 150–379)
RBC: 4.06 x10E6/uL (ref 3.77–5.28)
RDW: 13.4 % (ref 12.3–15.4)
WBC: 7.3 10*3/uL (ref 3.4–10.8)

## 2015-04-20 LAB — TSH: TSH: 2.55 u[IU]/mL (ref 0.450–4.500)

## 2015-04-26 ENCOUNTER — Other Ambulatory Visit (HOSPITAL_COMMUNITY)
Admission: RE | Admit: 2015-04-26 | Discharge: 2015-04-26 | Disposition: A | Discharge status: Home / Self Care | Payer: 59 | Source: Ambulatory Visit | Attending: Family Medicine | Admitting: Family Medicine

## 2015-04-26 ENCOUNTER — Encounter: Payer: Self-pay | Admitting: Family Medicine

## 2015-04-26 ENCOUNTER — Ambulatory Visit (INDEPENDENT_AMBULATORY_CARE_PROVIDER_SITE_OTHER): Payer: 59 | Admitting: Family Medicine

## 2015-04-26 VITALS — BP 122/68 | HR 62 | Temp 98.1°F | Wt 171.0 lb

## 2015-04-26 DIAGNOSIS — I10 Essential (primary) hypertension: Secondary | ICD-10-CM

## 2015-04-26 DIAGNOSIS — Z01419 Encounter for gynecological examination (general) (routine) without abnormal findings: Secondary | ICD-10-CM | POA: Diagnosis present

## 2015-04-26 DIAGNOSIS — Z1151 Encounter for screening for human papillomavirus (HPV): Secondary | ICD-10-CM | POA: Insufficient documentation

## 2015-04-26 DIAGNOSIS — Z0000 Encounter for general adult medical examination without abnormal findings: Secondary | ICD-10-CM

## 2015-04-26 DIAGNOSIS — E559 Vitamin D deficiency, unspecified: Secondary | ICD-10-CM

## 2015-04-26 DIAGNOSIS — E785 Hyperlipidemia, unspecified: Secondary | ICD-10-CM

## 2015-04-26 MED ORDER — LISINOPRIL 40 MG PO TABS: ORAL_TABLET | ORAL | Status: DC

## 2015-04-26 MED ORDER — TORSEMIDE 20 MG PO TABS: 20.0000 mg | ORAL_TABLET | Freq: Every day | ORAL | Status: DC

## 2015-04-26 NOTE — Progress Notes (Signed)
Subjective:    Patient ID: Rachael Russell, female    DOB: [birth year], 49 y.o.   MRN: [id number]  HPI Here for health maintenance exam and to review chronic medical problems    Wt is down 2 lb with bmi of 28 Not really eating right or taking great care of herself  Will be able to exercise next week   HIV screen -declines   Flu shot -declines  Mammogram was 6/16- had US guided bx for B9 papillomas -sees Dr Dalbert Batman Going to do genetic testing - mother had breast cancer  Self exam -some irritation at her bx site   Pap 8/15 nl  Ascus pap in the past  Menstrual status -having period about every 25 days /not skipping at all  Sometimes heavier than others   Td 8/10   bp is stable today  No cp or palpitations or headaches or edema  No side effects to medicines  BP Readings from Last 3 Encounters:  04/26/15 122/68  03/27/15 149/88  03/19/15 135/86     Hx of anemia that is resolved Lab Results  Component Value Date   WBC 7.3 04/19/2015   HGB 13.3 03/19/2015   HCT 37.4 04/19/2015   MCV 93.8 03/19/2015   PLT 306 03/19/2015     Cholesterol Lab Results  Component Value Date   CHOL 237* 04/19/2015   CHOL 206* 04/18/2014   CHOL 228* 04/14/2013   Lab Results  Component Value Date   HDL 71 04/19/2015   HDL 68 04/18/2014   HDL 74 04/14/2013   Lab Results  Component Value Date   LDLCALC 152* 04/19/2015   LDLCALC 122* 04/18/2014   LDLCALC 142* 04/14/2013   Lab Results  Component Value Date   TRIG 69 04/19/2015   TRIG 79 04/18/2014   TRIG 59 04/14/2013   Lab Results  Component Value Date   CHOLHDL 3.3 04/19/2015   CHOLHDL 3.0 04/18/2014   CHOLHDL 3.1 04/14/2013   Lab Results  Component Value Date   LDLDIRECT 149.9 05/11/2011   LDLDIRECT 123.2 01/03/2007    This is up significantly  Lot of fast food the past several weeks  Plans to get back to normal   Results for orders placed or performed in visit on 04/19/15  CBC with Differential/Platelet  Result  Value Ref Range   WBC 7.3 3.4 - 10.8 x10E3/uL   RBC 4.06 3.77 - 5.28 x10E6/uL   Hemoglobin 12.6 11.1 - 15.9 g/dL   Hematocrit 37.4 34.0 - 46.6 %   MCV 92 79 - 97 fL   MCH 31.0 26.6 - 33.0 pg   MCHC 33.7 31.5 - 35.7 g/dL   RDW 13.4 12.3 - 15.4 %   Platelets 348 150 - 379 x10E3/uL   Neutrophils 66 %   Lymphs 26 %   Monocytes 7 %   Eos 1 %   Basos 0 %   Neutrophils Absolute 4.8 1.4 - 7.0 x10E3/uL   Lymphocytes Absolute 1.9 0.7 - 3.1 x10E3/uL   Monocytes Absolute 0.5 0.1 - 0.9 x10E3/uL   EOS (ABSOLUTE) 0.1 0.0 - 0.4 x10E3/uL   Basophils Absolute 0.0 0.0 - 0.2 x10E3/uL   Immature Granulocytes 0 %   Immature Grans (Abs) 0.0 0.0 - 0.1 x10E3/uL  Comprehensive metabolic panel  Result Value Ref Range   Glucose 92 65 - 99 mg/dL   BUN 16 6 - 24 mg/dL   Creatinine, Ser 0.84 0.57 - 1.00 mg/dL   GFR calc non Af Amer 82 >  59 mL/min/1.73   GFR calc Af Amer 94 >59 mL/min/1.73   BUN/Creatinine Ratio 19 9 - 23   Sodium 140 134 - 144 mmol/L   Potassium 4.4 3.5 - 5.2 mmol/L   Chloride 103 97 - 108 mmol/L   CO2 24 18 - 29 mmol/L   Calcium 9.2 8.7 - 10.2 mg/dL   Total Protein 6.4 6.0 - 8.5 g/dL   Albumin 4.1 3.5 - 5.5 g/dL   Globulin, Total 2.3 1.5 - 4.5 g/dL   Albumin/Globulin Ratio 1.8 1.1 - 2.5   Bilirubin Total <0.2 0.0 - 1.2 mg/dL   Alkaline Phosphatase 56 39 - 117 IU/L   AST 16 0 - 40 IU/L   ALT 12 0 - 32 IU/L  Lipid panel  Result Value Ref Range   Cholesterol, Total 237 (H) 100 - 199 mg/dL   Triglycerides 69 0 - 149 mg/dL   HDL 71 >39 mg/dL   VLDL Cholesterol Cal 14 5 - 40 mg/dL   LDL Calculated 152 (H) 0 - 99 mg/dL   Chol/HDL Ratio 3.3 0.0 - 4.4 ratio units  TSH  Result Value Ref Range   TSH 2.550 0.450 - 4.500 uIU/mL     Patient Active Problem List   Diagnosis Date Noted  . Vitamin D deficiency 04/26/2015  . Breast lump in female 01/15/2015  . Fatigue 11/06/2014  . Hemorrhoids, external 10/13/2013  . Right arm pain 09/26/2013  . Cervical disc disorder with  radiculopathy of cervical region 09/15/2013  . Herpes zoster 03/07/2013  . 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 Nipple discharge, bloody 10/21/2012  . Routine general medical examination at a health care facility 05/11/2011  . Encounter for routine gynecological examination 05/11/2011  . Palpitations 12/19/2010  . Stress reaction, emotional 12/19/2010  . Allergic rhinitis 12/19/2010  . Fibrocystic breast disease 12/19/2010  . LEUKORRHEA 08/01/2010  . GERD 04/14/2010  . PLANTAR FASCIITIS, LEFT 04/14/2010  . 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 ASCUS PAP 07/12/2009  . SINUSITIS, CHRONIC FRONTAL 04/22/2007  . Iron deficiency anemia 01/04/2007  . Hyperlipidemia 12/21/2006  . METABOLIC SYNDROME X 67/20/9470  . Essential hypertension 12/21/2006  . EDEMA 12/21/2006   Past Medical History  Diagnosis Date  . Hyperlipidemia   . Hypertension   . Breast mass, right 03/14/08    mammogram and ultrasound  . Wears glasses   . GERD (gastroesophageal reflux disease)    Past Surgical History  Procedure Laterality Date  . Cesarean section  [id number]  . Tsa    . Hernia repair  2006    umb  . Tonsillectomy    . Dilation and curettage of uterus  1998  . Breast lumpectomy with needle localization Left 11/18/2012    Procedure: excise ductal system left breast. subarealar. left partial mastectomy with radiographic guidance;  Surgeon: [name], MD;  Location: [facility];  Service: General;  Laterality: Left;  excise ductal system left breast. subarealar. left partial mastectomy with radiographic guidance  . Breast ductal system excision Left 11/18/2012    Procedure: EXCISION DUCTAL SYSTEM BREAST;  Surgeon: [name], MD;  Location: [facility];  Service: General;  Laterality: Left;  . Breast surgery    . 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 Breast lumpectomy with needle localization Left  03/27/2015    Procedure: LEFT BREAST LUMPECTOMY  WITH NEEDLE LOCALIZATION TIMES TWO;  Surgeon: Fanny Skates,  MD;  Location: [facility];  Service: General;  Laterality: Left;   Social History  Substance Use Topics  . Smoking status: Never Smoker   . Smokeless tobacco: Never Used  . Alcohol Use: No   Family History  Problem Relation Age of Onset  . Hypertension Mother   . Arthritis Mother     osteoarthritis  . Breast cancer Mother   . Hypertension Father   . Arthritis Father     osteoarthritis  . Cancer Maternal Grandmother     breast   Allergies  Allergen Reactions  . Ferrous Sulfate     constipation  . Penicillins     REACTION: ? if allergic as child  . Prilosec [Omeprazole Magnesium]     Dizziness    Current Outpatient Prescriptions on File Prior to Visit  Medication Sig Dispense Refill  . cholecalciferol (VITAMIN D) 1000 UNITS tablet Take 1,000 Units by mouth daily.    . ferrous sulfate 325 (65 FE) MG tablet Take 325 mg by mouth daily with breakfast.    . HYDROcodone-acetaminophen (NORCO) 5-325 MG per tablet Take 1-2 tablets by mouth every 6 (six) hours as needed for moderate pain or severe pain. 30 tablet 0  . Vitamin D, Ergocalciferol, (DRISDOL) 50000 UNITS CAPS capsule Take 1 capsule (50,000 Units total) by mouth every 7 (seven) days. 30 capsule 12   No current facility-administered medications on file prior to visit.    Review of Systems Review of Systems  Constitutional: Negative for fever, appetite change, fatigue and unexpected weight change.  Eyes: Negative for pain and visual disturbance.  Respiratory: Negative for cough and shortness of breath.   Cardiovascular: Negative for cp or palpitations    Gastrointestinal: Negative for nausea, diarrhea and constipation.  Genitourinary: Negative for urgency and frequency. pos for fibrocystic breasts  Skin: Negative for pallor or rash   Neurological: Negative for weakness, light-headedness, numbness and headaches.    Hematological: Negative for adenopathy. Does not bruise/bleed easily.  Psychiatric/Behavioral: Negative for dysphoric mood. The patient is not nervous/anxious.         Objective:   Physical Exam  Constitutional: She appears well-developed and well-nourished. No distress.  Well appearing   HENT:  Head: Normocephalic and atraumatic.  Right Ear: External ear normal.  Left Ear: External ear normal.  Mouth/Throat: Oropharynx is clear and moist.  Eyes: Conjunctivae and EOM are normal. Pupils are equal, round, and reactive to light. No scleral icterus.  Neck: Normal range of motion. Neck supple. No JVD present. Carotid bruit is not present. No thyromegaly present.  Cardiovascular: Normal rate, regular rhythm, normal heart sounds and intact distal pulses.  Exam reveals no gallop.   Pulmonary/Chest: Effort normal and breath sounds normal. No respiratory distress. She has no wheezes. She exhibits no tenderness.  Abdominal: Soft. Bowel sounds are normal. She exhibits no distension, no abdominal bruit and no mass. There is no tenderness.  Genitourinary: No breast swelling, tenderness, discharge or bleeding. There is no rash, tenderness or lesion on the right labia. There is no rash, tenderness or lesion on the left labia. Uterus is enlarged. Uterus is not deviated, not fixed and not tender. Cervix exhibits no motion tenderness, no discharge and no friability. Right adnexum displays no mass, no tenderness and no fullness. Left adnexum displays no mass, no tenderness and no fullness. No erythema, tenderness or bleeding in the vagina.  Surgical changes on L breast with bx sites  Lower one has developed palpable scarring No drainage  Breast exam:  No mass, nodules,, tenderness, bulging, retraction, inflamation, nipple discharge or skin changes noted.  No axillary or clavicular LA.      Baseline uterine fibroids noted on exam  Musculoskeletal: Normal range of motion. She exhibits no edema or tenderness.   Lymphadenopathy:    She has no cervical adenopathy.  Neurological: She is alert. She has normal reflexes. No cranial nerve deficit. She exhibits normal muscle tone. Coordination normal.  Skin: Skin is warm and dry. No rash noted. No erythema. No pallor.  Psychiatric: She has a normal mood and affect.          Assessment & Plan:   Problem List Items Addressed This Visit      Cardiovascular and Mediastinum   Essential hypertension    bp in fair control at this time  BP Readings from Last 1 Encounters:  04/26/15 122/68   No changes needed Disc lifstyle change with low sodium diet and exercise  Labs reviewed       Relevant Medications   torsemide (DEMADEX) 20 MG tablet   lisinopril (PRINIVIL,ZESTRIL) 40 MG tablet     Other   Encounter for routine gynecological examination    Routine exam Still having menses monthly  Hx of fibroids       Relevant Orders   Cytology - PAP   Hyperlipidemia    Cholesterol is up with change in eating  Disc goals for lipids and reasons to control them Rev labs with pt Rev low sat fat diet in detail Pt wants to work on diet again with goal of LDL under 130 (even better 100) Handout given on diet       Relevant Medications   torsemide (DEMADEX) 20 MG tablet   lisinopril (PRINIVIL,ZESTRIL) 40 MG tablet   Routine general medical examination at a health care facility - Primary    Reviewed health habits including diet and exercise and skin cancer prevention Reviewed appropriate screening tests for age  Also reviewed health mt list, fam hx and immunization status , as well as social and family history   See HPI Labs rev  Gyn exam done  Disc low cholesterol diet  Pt declines flu shot  Declines HIV screen since she is low risk       Vitamin D deficiency    Level today on current dosage  Pt has hx of very low D in the past  Disc imp to overall and bone health

## 2015-04-26 NOTE — Assessment & Plan Note (Signed)
bp in fair control at this time  BP Readings from Last 1 Encounters:  04/26/15 122/68   No changes needed Disc lifstyle change with low sodium diet and exercise  Labs reviewed

## 2015-04-26 NOTE — Progress Notes (Signed)
Pre visit review using our clinic review tool, if applicable. No additional management support is needed unless otherwise documented below in the visit note. 

## 2015-04-26 NOTE — Assessment & Plan Note (Signed)
Routine exam Still having menses monthly  Hx of fibroids

## 2015-04-26 NOTE — Patient Instructions (Signed)
Labs look ok but cholesterol is up  Avoid red meat/ fried foods/ egg yolks/ fatty breakfast meats/ butter, cheese and high fat dairy/ and shellfish   Get back to exercise when cleared to do so

## 2015-04-28 NOTE — Assessment & Plan Note (Signed)
Level today on current dosage  Pt has hx of very low D in the past  Disc imp to overall and bone health

## 2015-04-28 NOTE — Assessment & Plan Note (Signed)
Cholesterol is up with change in eating  Disc goals for lipids and reasons to control them Rev labs with pt Rev low sat fat diet in detail Pt wants to work on diet again with goal of LDL under 130 (even better 100) Handout given on diet

## 2015-04-28 NOTE — Assessment & Plan Note (Addendum)
Reviewed health habits including diet and exercise and skin cancer prevention Reviewed appropriate screening tests for age  Also reviewed health mt list, fam hx and immunization status , as well as social and family history   See HPI Labs rev  Gyn exam done  Disc low cholesterol diet  Pt declines flu shot  Declines HIV screen since she is low risk

## 2015-05-01 ENCOUNTER — Other Ambulatory Visit: Payer: 59

## 2015-05-02 LAB — CYTOLOGY - PAP

## 2015-06-24 ENCOUNTER — Telehealth: Payer: Self-pay

## 2015-06-24 NOTE — Telephone Encounter (Signed)
Pt left v/m; pt last annual exam was 04/26/2015. Pt had stopped ranitidine for acid reflux and indigestion because pt was not having symptoms; pt now request refill ranitidine to [facility] [place]; on hx med list ranitidine 150 mg  # 90 x 1 09/22/2012. Please advise.

## 2015-06-24 NOTE — Telephone Encounter (Signed)
Please refill that for a year

## 2015-06-25 MED ORDER — RANITIDINE HCL 150 MG PO TABS: 150.0000 mg | ORAL_TABLET | Freq: Every day | ORAL | Status: DC | PRN

## 2015-06-25 NOTE — Telephone Encounter (Signed)
Done, and pt notified Rx sent

## 2015-08-12 ENCOUNTER — Other Ambulatory Visit (INDEPENDENT_AMBULATORY_CARE_PROVIDER_SITE_OTHER): Payer: 59

## 2015-08-12 DIAGNOSIS — E559 Vitamin D deficiency, unspecified: Secondary | ICD-10-CM | POA: Diagnosis not present

## 2015-08-13 LAB — VITAMIN D 25 HYDROXY (VIT D DEFICIENCY, FRACTURES): Vit D, 25-Hydroxy: 44.6 ng/mL (ref 30.0–100.0)

## 2015-08-20 ENCOUNTER — Other Ambulatory Visit: Payer: Self-pay

## 2015-08-20 MED ORDER — LISINOPRIL 40 MG PO TABS: ORAL_TABLET | ORAL | Status: DC

## 2015-08-20 NOTE — Telephone Encounter (Signed)
Pt request 30 day supply lisinopril 40 mg sent to [facility] while waiting on mail order delivery. Advised pt done per protocol. Pt voiced understanding.

## 2015-08-28 ENCOUNTER — Telehealth: Payer: Self-pay | Admitting: Family Medicine

## 2015-08-28 MED ORDER — LOSARTAN POTASSIUM 50 MG PO TABS: 50.0000 mg | ORAL_TABLET | Freq: Every day | ORAL | Status: DC

## 2015-08-28 NOTE — Telephone Encounter (Addendum)
Pt read the side eff of lisinopril, and she has seen on TV how a lot of people are having major issues with med, pt said she didn't connect her sxs as being side eff of lisinopril until she read the booklet that comes with the med. Pt said she has been dealing with blurry vision, her voice has been hoarse, and she feels foggy/unable to focus in the morning, and she said all of these were side eff of lisinopril pt is afraid to keep taking it because she doesn't want to cause any long term damage to her body and she is requesting a alt med sent to her pharmacy, please advise   Pt also advise of Dr. Marliss Coots comments about vit D on mychart

## 2015-08-28 NOTE — Addendum Note (Signed)
Addended by: Loura Pardon A on: 08/28/2015 01:14 PM   Modules accepted: Orders, Medications

## 2015-08-28 NOTE — Telephone Encounter (Signed)
We will start with losartan 50 mg and if bp is too high we can increase it to 100  If she can check bp outside the office let me know how she is doing in 2-4 weeks  If not -follow up in 4 weeks approx for f/u to check bp

## 2015-08-28 NOTE — Telephone Encounter (Signed)
Left detailed voicemail letting pt know labs were released on mychart and advise pt of what Dr. Marliss Coots comments were

## 2015-08-28 NOTE — Telephone Encounter (Signed)
Much improved vitamin D level - take 2000-4000 iu D3 otc daily now   Let's change lisinopril to losartan and see how you do  If any problems let me know

## 2015-08-28 NOTE — Telephone Encounter (Signed)
Pt called to get lab results and has ? About meds

## 2015-08-29 NOTE — Telephone Encounter (Signed)
Pt notified Rx sent to pharmacy and advise of Dr. Tower's comments and verbalized understanding  

## 2015-08-30 MED ORDER — VALSARTAN-HYDROCHLOROTHIAZIDE 160-25 MG PO TABS: 1.0000 | ORAL_TABLET | Freq: Every day | ORAL | Status: DC

## 2015-08-30 NOTE — Telephone Encounter (Addendum)
Pt left v/m for [facility]; pt has not picked up the losartan; pt has been reading about losartan and concerned about possible side effects; pt understands that should not take losartan if taking a fluid pill. Pt wants to know if Diovan HCT 160/25 would be a possible med for pt to substitute instead of lisinopril and torsemide. Pt is not presently taking any BP med.Pt request cb. [place] [facility]

## 2015-08-30 NOTE — Addendum Note (Signed)
Addended by: Tammi Sou on: 08/30/2015 12:51 PM   Modules accepted: Orders, Medications

## 2015-08-30 NOTE — Telephone Encounter (Signed)
That is fine if that is what she feels more comfortable with  Stop torsemide  Dc losartan Call in diovan hct 160/25 one po qd #30 3 ref F/u 2 weeks for visit (and lab that day) If this lowers bp too much hold it and let me know

## 2015-08-30 NOTE — Telephone Encounter (Signed)
Pt notified of Dr. Marliss Coots comments/instructions. Rx sent to pharmacy and 2 week f/u appt scheduled

## 2015-09-02 ENCOUNTER — Telehealth: Payer: Self-pay | Admitting: Family Medicine

## 2015-09-02 NOTE — Telephone Encounter (Signed)
[facility]  Patient Name: Rachael Russell  DOB: 03/17/1966    Initial Comment Caller states dr started taking new meds on Sat, changed from Lisinopril to Diovan, had discomfort in right chest on Sat, bp been elevated, 129/95, coughed a lot last night, scratchy throat, headache,    Nurse Assessment  Nurse: Wynetta Emery, RN, Baker Janus Date/Time (Eastern Time): 09/02/2015 4:58:18 PM  Confirm and document reason for call. If symptomatic, describe symptoms. ---Gerhard Perches was having upper chest stabbing numbing feeling pain which went away in right side of chest; Saturday then yesterday scratchy throat, coughing not as much coughing -- blood pressure elevated; headache; -- headache not present unless coughing -- 129/95 better than yesterday actually feel better today  Has the patient traveled out of the country within the last 30 days? ---No  Does the patient have any new or worsening symptoms? ---Yes  Will a triage be completed? ---Yes  Related visit to physician within the last 2 weeks? ---No  Does the PT have any chronic conditions? (i.e. diabetes, asthma, etc.) ---Yes  List chronic conditions. ---HTN  Did the patient indicate they were pregnant? ---No  Is this a behavioral health or substance abuse call? ---No     Guidelines    Guideline Title Affirmed Question Affirmed Notes       Final Disposition User        Comments  NOTE; was able to give appt but declined too early in am, roads have not been cleaned yet and afraid to leave home BEFORE they are cleaned will call in tomorrow to obtain appt only TRIAGED all ready  NOTE: on Saturday started NEW HTN MEDICATION Diovan -- may be having side effects verses flu like symptoms do not feel she needs to wait until 25th for f/u would like her evaluated since symptoms were pretty serious when occurred Saturday night better today but needs evaluated

## 2015-09-03 ENCOUNTER — Encounter: Payer: Self-pay | Admitting: Primary Care

## 2015-09-03 ENCOUNTER — Ambulatory Visit (INDEPENDENT_AMBULATORY_CARE_PROVIDER_SITE_OTHER): Payer: 59 | Admitting: Primary Care

## 2015-09-03 ENCOUNTER — Ambulatory Visit: Payer: 59 | Admitting: Primary Care

## 2015-09-03 VITALS — BP 144/94 | HR 79 | Temp 98.1°F | Ht 65.25 in | Wt 185.0 lb

## 2015-09-03 DIAGNOSIS — I10 Essential (primary) hypertension: Secondary | ICD-10-CM | POA: Diagnosis not present

## 2015-09-03 MED ORDER — LISINOPRIL 40 MG PO TABS: 40.0000 mg | ORAL_TABLET | Freq: Every day | ORAL | Status: DC

## 2015-09-03 MED ORDER — TORSEMIDE 20 MG PO TABS: 20.0000 mg | ORAL_TABLET | Freq: Every day | ORAL | Status: DC

## 2015-09-03 NOTE — Assessment & Plan Note (Addendum)
Once managed on lisinopril and torsemide for years, doing well on this medication, but she had become worried as she had been reading about side effects of lisinopril on the internet Switched to diovan which she started Saturday.  Home BP readings elevated, retaining fluid.  Spent a long time discussing effects of various medications. She felt much better overall on lisinopril and torsemide; switched back to both medications as they have historically worked well in the past.  Suspect her symptoms are related to the beginning of a viral URI. Headaches likely due to elevated BP readings. Supportive treatment provided.  She is to record home BP readings and bring to her next appointment with PCP on 09/18/15.

## 2015-09-03 NOTE — Patient Instructions (Addendum)
Stop taking Diovan.  Start Lisinopril 40 mg and Torsemide 20 mg tablets for blood pressure. Take 1 tablet of each, by mouth, once daily.  Please notify myself or Dr. Glori Bickers if no improvement in blood pressure readings.  Check your blood pressure daily, around the same time of day, for the next 2 weeks.   Ensure that you have rested for 30 minutes prior to checking your blood pressure. Record your readings and sent them to Dr. Glori Bickers through My Chart in 2 weeks.  Follow up with Dr. Glori Bickers in 4 weeks for re-evaluation.  Nasal congestion: Flonase (fluticasone) nasal spray. Instill 2 sprays in each nostril once daily.  Throat drainage: Zyrtec at bedtime.   Cough/Chest congestion: Mucinex DM tablets. Ensure you take this with a full glass of water.  It was a pleasure meeting you!

## 2015-09-03 NOTE — Telephone Encounter (Signed)
Pt scheduled appt 09/03/15 at 1:45 with Allie Bossier NP.

## 2015-09-03 NOTE — Progress Notes (Signed)
Subjective:    Patient ID: Rachael Russell, female    DOB: April 28, 1966, 50 y.o.   MRN: [id number]  HPI  Rachael Russell is a 50 year old female who presents today wit ha chief complaint of hypertension. She is currently managed on Diovan HCT 160/25 that was initiated last week and began taking this on Satuday January 7th. She was once managed on lisinopril and torsemide for years. She started reading on the internet regarding side effects and wanted to switch due to noticing "hoarsness and scratchy throat".   She was initiated on Losartan initially but did not take and wanted to try Diovan insteaed. 1 hour after taking she noticed a discomfort to her right anterior chest wall when moving her right arm. Since initiation she's also noticed an increase in her blood pressure and retention of fluid.   Her recent home BP readings have been: Saturday: 144-149/105 Sunday: Cough began. She took cough drops and continued taking her BP medication. BP 140's/100 Monday: Cough improved during day, worse at night, felt better. BP still about the same at 140's/100. She   She also reports scratchy throat, cough, post nasal drip, and headaches that are worse at night. These symptoms began late last week. Denies fevers, chills, sick contacts. She's not taken anything OTC for her symptoms.  Review of Systems  Constitutional: Negative for fever and chills.  HENT: Positive for congestion, postnasal drip and sore throat.   Respiratory: Positive for cough. Negative for shortness of breath.   Cardiovascular: Negative for chest pain.  Neurological: Positive for headaches.       Past Medical History  Diagnosis Date  . Hyperlipidemia   . Hypertension   . Breast mass, right 03/14/08    mammogram and ultrasound  . Wears glasses   . GERD (gastroesophageal reflux disease)     Social History   Social History  . Marital Status: Married    Spouse Name: N/A  . Number of Children: 2  . Years of Education: N/A    Occupational History  . 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 Smoking status: Never Smoker   . Smokeless tobacco: Never Used  . Alcohol Use: No  . Drug Use: No  . Sexual Activity: Not on file   Other Topics Concern  . Not on file   Social History Narrative    Past Surgical History  Procedure Laterality Date  . Cesarean section  [id number]  . Tsa    . Hernia repair  2006    umb  . Tonsillectomy    . Dilation and curettage of uterus  1998  . Breast lumpectomy with needle localization Left 11/18/2012    Procedure: excise ductal system left breast. subarealar. left partial mastectomy with radiographic guidance;  Surgeon: [name], MD;  Location: [facility];  Service: General;  Laterality: Left;  excise ductal system left breast. subarealar. left partial mastectomy with radiographic guidance  . Breast ductal system excision Left 11/18/2012    Procedure: EXCISION DUCTAL SYSTEM BREAST;  Surgeon: [name], MD;  Location: [facility];  Service: General;  Laterality: Left;  . Breast surgery    . 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 Breast lumpectomy with needle localization Left 03/27/2015    Procedure: LEFT BREAST LUMPECTOMY  WITH NEEDLE LOCALIZATION TIMES TWO;  Surgeon: Fanny Skates, MD;  Location: [facility];  Service: General;  Laterality: Left;    Family History  Problem Relation Age of Onset  . Hypertension Mother   . Arthritis Mother     osteoarthritis  . Breast cancer Mother   . Hypertension Father   . Arthritis Father     osteoarthritis  . Cancer Maternal Grandmother     breast    Allergies  Allergen Reactions  . Ferrous Sulfate     constipation  . Lisinopril Other (See Comments)    Blurry vision/ hoarse voice   . Penicillins     REACTION: ? if allergic as child  . Prilosec [Omeprazole Magnesium]     Dizziness     Current  Outpatient Prescriptions on File Prior to Visit  Medication Sig Dispense Refill  . cholecalciferol (VITAMIN D) 1000 UNITS tablet Take 1,000 Units by mouth daily.    . ferrous sulfate 325 (65 FE) MG tablet Take 325 mg by mouth daily with breakfast.    . HYDROcodone-acetaminophen (NORCO) 5-325 MG per tablet Take 1-2 tablets by mouth every 6 (six) hours as needed for moderate pain or severe pain. 30 tablet 0  . ranitidine (ZANTAC) 150 MG tablet Take 1 tablet (150 mg total) by mouth daily as needed for heartburn. 90 tablet 3   No current facility-administered medications on file prior to visit.    BP 144/94 mmHg  Pulse 79  Temp(Src) 98.1 F (36.7 C) (Oral)  Ht 5' 5.25" (1.657 m)  Wt 185 lb (83.915 kg)  BMI 30.56 kg/m2  SpO2 98%  LMP 08/25/2015    Objective:   Physical Exam  Constitutional: She appears well-nourished.  HENT:  Right Ear: Tympanic membrane and ear canal normal.  Left Ear: Tympanic membrane and ear canal normal.  Nose: Right sinus exhibits no maxillary sinus tenderness and no frontal sinus tenderness. Left sinus exhibits no maxillary sinus tenderness and no frontal sinus tenderness.  Eyes: Conjunctivae are normal.  Neck: Neck supple.  Cardiovascular: Normal rate and regular rhythm.   Pulmonary/Chest: Effort normal and breath sounds normal. She has no rales.  Lymphadenopathy:    She has no cervical adenopathy.  Skin: Skin is warm and dry.          Assessment & Plan:

## 2015-09-03 NOTE — Progress Notes (Signed)
Pre visit review using our clinic review tool, if applicable. No additional management support is needed unless otherwise documented below in the visit note. 

## 2015-09-09 ENCOUNTER — Telehealth: Payer: Self-pay | Admitting: Family Medicine

## 2015-09-09 NOTE — Telephone Encounter (Signed)
Patient Name: Rachael Russell  DOB: [birth year]    Initial Comment Caller states her medication was changed on about 1/10 then switched back, but still has cough and sore throat   Nurse Assessment  Nurse: Wisdom, RN, Susie Date/Time (Eastern Time): 09/09/2015 11:29:41 AM  Confirm and document reason for call. If symptomatic, describe symptoms. You must click the next button to save text entered. ---Caller states her medication was changed on about 1/10 then switched back to Lisinopril; still retaining fluid still has a really bad cough and headache; cough has been productive x 1 with a green mucus;  Has the patient traveled out of the country within the last 30 days? ---Not Applicable  Does the patient have any new or worsening symptoms? ---No     Guidelines    Guideline Title Affirmed Question Affirmed Notes       Final Disposition User        Comments  Returned call to caller - appointment availabilities provided - she selected 0915 in AM (aware that no appointments available today)

## 2015-09-09 NOTE — Telephone Encounter (Signed)
I will see her then  

## 2015-09-09 NOTE — Telephone Encounter (Signed)
Pt has appt with Dr Glori Bickers 09/10/15 at 9:15.

## 2015-09-10 ENCOUNTER — Encounter: Payer: Self-pay | Admitting: Family Medicine

## 2015-09-10 ENCOUNTER — Ambulatory Visit (INDEPENDENT_AMBULATORY_CARE_PROVIDER_SITE_OTHER): Payer: 59 | Admitting: Family Medicine

## 2015-09-10 VITALS — BP 122/86 | HR 72 | Temp 98.5°F | Ht 65.25 in | Wt 184.8 lb

## 2015-09-10 DIAGNOSIS — J019 Acute sinusitis, unspecified: Secondary | ICD-10-CM | POA: Insufficient documentation

## 2015-09-10 DIAGNOSIS — J0120 Acute ethmoidal sinusitis, unspecified: Secondary | ICD-10-CM | POA: Diagnosis not present

## 2015-09-10 DIAGNOSIS — I10 Essential (primary) hypertension: Secondary | ICD-10-CM | POA: Diagnosis not present

## 2015-09-10 MED ORDER — LEVOFLOXACIN 500 MG PO TABS: 500.0000 mg | ORAL_TABLET | Freq: Every day | ORAL | Status: DC

## 2015-09-10 MED ORDER — BENZONATATE 200 MG PO CAPS: 200.0000 mg | ORAL_CAPSULE | Freq: Three times a day (TID) | ORAL | Status: DC | PRN

## 2015-09-10 MED ORDER — HYDROCODONE-HOMATROPINE 5-1.5 MG/5ML PO SYRP: 5.0000 mL | ORAL_SOLUTION | Freq: Every evening | ORAL | Status: DC | PRN

## 2015-09-10 NOTE — Progress Notes (Signed)
Pre visit review using our clinic review tool, if applicable. No additional management support is needed unless otherwise documented below in the visit note. 

## 2015-09-10 NOTE — Progress Notes (Signed)
Subjective:    Patient ID: Rachael Russell, female    DOB: [birth year], 50 y.o.   MRN: [id number]  HPI Here for HTN   She switched from lisinopril and torsemide to diovan hct because she was afraid of side eff of the lisinopril and torsemide  Had heard of someone she knew who died of ace anaphylaxis   When she switched it - fluid back up and bp went up   Went back to lisinopril and torsemide = 5 days  Still swollen   BP Readings from Last 3 Encounters:  09/10/15 122/86  09/03/15 144/94  04/26/15 122/68   She thinks in retrospect her cough is not due to medicines  Cough/ pnd started on 1/4  achey and run down  Little to no nasal congestion  Sunday she had sinus pain on the L side - ? Sinus pain (she took tylenol and robitussin)  Runny nose -worse with cough   Now taking robitussin and ny quil  Cough was prod Monday- a little green phlegm  No fever   Patient Active Problem List   Diagnosis Date Noted  . Vitamin D deficiency 04/26/2015  . Breast lump in female 01/15/2015  . Fatigue 11/06/2014  . Hemorrhoids, external 10/13/2013  . Right arm pain 09/26/2013  . Cervical disc disorder with radiculopathy of cervical region 09/15/2013  . Herpes zoster 03/07/2013  . 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 Nipple discharge, bloody 10/21/2012  . Routine general medical examination at a health care facility 05/11/2011  . Encounter for routine gynecological examination 05/11/2011  . Palpitations 12/19/2010  . Stress reaction, emotional 12/19/2010  . Allergic rhinitis 12/19/2010  . Fibrocystic breast disease 12/19/2010  . LEUKORRHEA 08/01/2010  . GERD 04/14/2010  . PLANTAR FASCIITIS, LEFT 04/14/2010  . 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 ASCUS PAP 07/12/2009  . SINUSITIS, CHRONIC FRONTAL 04/22/2007  . Iron deficiency anemia 01/04/2007  . Hyperlipidemia 12/21/2006  . METABOLIC SYNDROME X 99991111  . Essential hypertension 12/21/2006  .  EDEMA 12/21/2006   Past Medical History  Diagnosis Date  . Hyperlipidemia   . Hypertension   . Breast mass, right 03/14/08    mammogram and ultrasound  . Wears glasses   . GERD (gastroesophageal reflux disease)    Past Surgical History  Procedure Laterality Date  . Cesarean section  [id number]  . Tsa    . Hernia repair  2006    umb  . Tonsillectomy    . Dilation and curettage of uterus  1998  . Breast lumpectomy with needle localization Left 11/18/2012    Procedure: excise ductal system left breast. subarealar. left partial mastectomy with radiographic guidance;  Surgeon: [name], MD;  Location: [facility];  Service: General;  Laterality: Left;  excise ductal system left breast. subarealar. left partial mastectomy with radiographic guidance  . Breast ductal system excision Left 11/18/2012    Procedure: EXCISION DUCTAL SYSTEM BREAST;  Surgeon: [name], MD;  Location: [facility];  Service: General;  Laterality: Left;  . Breast surgery    . 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 Breast lumpectomy with needle localization Left 03/27/2015    Procedure: LEFT BREAST LUMPECTOMY  WITH NEEDLE LOCALIZATION TIMES TWO;  Surgeon: Fanny Skates, MD;  Location: [facility];  Service: General;  Laterality: Left;   Social History  Substance Use Topics  . Smoking status: Never Smoker   .  Smokeless tobacco: Never Used  . Alcohol Use: No   Family History  Problem Relation Age of Onset  . Hypertension Mother   . Arthritis Mother     osteoarthritis  . Breast cancer Mother   . Hypertension Father   . Arthritis Father     osteoarthritis  . Cancer Maternal Grandmother     breast   Allergies  Allergen Reactions  . Ferrous Sulfate     constipation  . Lisinopril Other (See Comments)    Blurry vision/ hoarse voice   . Penicillins     REACTION: ? if allergic as child  . Prilosec [Omeprazole Magnesium]     Dizziness     Current Outpatient Prescriptions on File Prior to Visit  Medication Sig Dispense Refill  . cholecalciferol (VITAMIN D) 1000 UNITS tablet Take 1,000 Units by mouth daily.    . ferrous sulfate 325 (65 FE) MG tablet Take 325 mg by mouth daily with breakfast.    . HYDROcodone-acetaminophen (NORCO) 5-325 MG per tablet Take 1-2 tablets by mouth every 6 (six) hours as needed for moderate pain or severe pain. 30 tablet 0  . lisinopril (PRINIVIL,ZESTRIL) 40 MG tablet Take 1 tablet (40 mg total) by mouth daily. 30 tablet 2  . ranitidine (ZANTAC) 150 MG tablet Take 1 tablet (150 mg total) by mouth daily as needed for heartburn. 90 tablet 3  . torsemide (DEMADEX) 20 MG tablet Take 1 tablet (20 mg total) by mouth daily. 30 tablet 2   No current facility-administered medications on file prior to visit.      Review of Systems Review of Systems  Constitutional: Negative for fever, appetite change,  and unexpected weight change.  ENt pos for rhinorrhea/ sinus pain and pnd Eyes: Negative for pain and visual disturbance.  Respiratory: Negative for wheeze  and shortness of breath.   Cardiovascular: Negative for cp or palpitations   pos for pedal edema that is not pitting  Gastrointestinal: Negative for nausea, diarrhea and constipation.  Genitourinary: Negative for urgency and frequency.  Skin: Negative for pallor or rash   Neurological: Negative for weakness, light-headedness, numbness and headaches.  Hematological: Negative for adenopathy. Does not bruise/bleed easily.  Psychiatric/Behavioral: Negative for dysphoric mood. The patient is not nervous/anxious.         Objective:   Physical Exam  Constitutional: She appears well-developed and well-nourished. No distress.  overwt and well app   HENT:  Head: Normocephalic and atraumatic.  Right Ear: External ear normal.  Left Ear: External ear normal.  Mouth/Throat: Oropharynx is clear and moist. No oropharyngeal exudate.  Nares are injected and  congested  Bilateral maxillary sinus tenderness  Post nasal drip   Eyes: Conjunctivae and EOM are normal. Pupils are equal, round, and reactive to light. Right eye exhibits no discharge. Left eye exhibits no discharge.  Neck: Normal range of motion. Neck supple. No JVD present. Carotid bruit is not present. No thyromegaly present.  Cardiovascular: Normal rate, regular rhythm, normal heart sounds and intact distal pulses.  Exam reveals no gallop.   Pulmonary/Chest: Effort normal and breath sounds normal. No respiratory distress. She has no wheezes. She has no rales. She exhibits no tenderness.  No crackles  Abdominal: Soft. Bowel sounds are normal. She exhibits no distension, no abdominal bruit and no mass. There is no tenderness.  Musculoskeletal: She exhibits no edema.  No pitting edema noted    Lymphadenopathy:    She has no cervical adenopathy.  Neurological: She is alert. She has  normal reflexes. No cranial nerve deficit.  Skin: Skin is warm and dry. No rash noted.  Psychiatric: She has a normal mood and affect.          Assessment & Plan:   Problem List Items Addressed This Visit      Cardiovascular and Mediastinum   Essential hypertension - Primary    bp in fair control at this time  BP Readings from Last 1 Encounters:  09/10/15 122/86   No changes needed-pt decided to stay on lisinopril and torsemide -suspect edema will get better  If cough does not stop (suspect due to a sinus infection)- will update  ARB with hctz did not control bp as well Disc lifstyle change with low sodium diet and exercise          Respiratory   Acute sinusitis    With cough and facial pain  Cover with levaquin (pcn all)  Take the levaquin for a suspected sinus infection  Drink lots of water You can try claritin or zyrtec or allegra for the drip and runny nose  Tessalon pills (do not bite the pill) for cough as needed  Hycodan at night for cough as needed- caution of sedation   Update  if not starting to improve in a week or if worsening        Relevant Medications   levofloxacin (LEVAQUIN) 500 MG tablet   benzonatate (TESSALON) 200 MG capsule   HYDROcodone-homatropine (HYCODAN) 5-1.5 MG/5ML syrup

## 2015-09-10 NOTE — Patient Instructions (Signed)
Take the levaquin for a suspected sinus infection  Drink lots of water You can try claritin or zyrtec or allegra for the drip and runny nose  Tessalon pills (do not bite the pill) for cough as needed  Hycodan at night for cough as needed- caution of sedation   Update if not starting to improve in a week or if worsening    Stay on current blood pressure medicine

## 2015-09-11 NOTE — Assessment & Plan Note (Signed)
bp in fair control at this time  BP Readings from Last 1 Encounters:  09/10/15 122/86   No changes needed-pt decided to stay on lisinopril and torsemide -suspect edema will get better  If cough does not stop (suspect due to a sinus infection)- will update  ARB with hctz did not control bp as well Disc lifstyle change with low sodium diet and exercise

## 2015-09-11 NOTE — Assessment & Plan Note (Signed)
With cough and facial pain  Cover with levaquin (pcn all)  Take the levaquin for a suspected sinus infection  Drink lots of water You can try claritin or zyrtec or allegra for the drip and runny nose  Tessalon pills (do not bite the pill) for cough as needed  Hycodan at night for cough as needed- caution of sedation   Update if not starting to improve in a week or if worsening

## 2015-09-18 ENCOUNTER — Ambulatory Visit: Payer: 59 | Admitting: Family Medicine

## 2015-10-21 ENCOUNTER — Other Ambulatory Visit: Payer: Self-pay | Admitting: *Deleted

## 2015-10-21 MED ORDER — RANITIDINE HCL 150 MG PO TABS: 150.0000 mg | ORAL_TABLET | Freq: Every day | ORAL | Status: DC | PRN

## 2015-10-26 ENCOUNTER — Encounter (HOSPITAL_COMMUNITY): Payer: Self-pay | Admitting: Nurse Practitioner

## 2015-10-26 ENCOUNTER — Emergency Department (HOSPITAL_COMMUNITY)
Admission: EM | Admit: 2015-10-26 | Discharge: 2015-10-26 | Disposition: A | Discharge status: Home / Self Care | Payer: 59 | Attending: Emergency Medicine | Admitting: Emergency Medicine

## 2015-10-26 ENCOUNTER — Emergency Department (HOSPITAL_COMMUNITY): Payer: 59

## 2015-10-26 DIAGNOSIS — S0990XA Unspecified injury of head, initial encounter: Secondary | ICD-10-CM | POA: Insufficient documentation

## 2015-10-26 DIAGNOSIS — R519 Headache, unspecified: Secondary | ICD-10-CM

## 2015-10-26 DIAGNOSIS — S3992XA Unspecified injury of lower back, initial encounter: Secondary | ICD-10-CM | POA: Diagnosis not present

## 2015-10-26 DIAGNOSIS — S161XXA Strain of muscle, fascia and tendon at neck level, initial encounter: Secondary | ICD-10-CM | POA: Diagnosis not present

## 2015-10-26 DIAGNOSIS — Z8742 Personal history of other diseases of the female genital tract: Secondary | ICD-10-CM | POA: Insufficient documentation

## 2015-10-26 DIAGNOSIS — K219 Gastro-esophageal reflux disease without esophagitis: Secondary | ICD-10-CM | POA: Diagnosis not present

## 2015-10-26 DIAGNOSIS — Y9389 Activity, other specified: Secondary | ICD-10-CM | POA: Diagnosis not present

## 2015-10-26 DIAGNOSIS — Z79899 Other long term (current) drug therapy: Secondary | ICD-10-CM | POA: Insufficient documentation

## 2015-10-26 DIAGNOSIS — Z8639 Personal history of other endocrine, nutritional and metabolic disease: Secondary | ICD-10-CM | POA: Insufficient documentation

## 2015-10-26 DIAGNOSIS — Z792 Long term (current) use of antibiotics: Secondary | ICD-10-CM | POA: Insufficient documentation

## 2015-10-26 DIAGNOSIS — Y92410 Unspecified street and highway as the place of occurrence of the external cause: Secondary | ICD-10-CM | POA: Insufficient documentation

## 2015-10-26 DIAGNOSIS — Y998 Other external cause status: Secondary | ICD-10-CM | POA: Diagnosis not present

## 2015-10-26 DIAGNOSIS — Z880 Allergy status to penicillin: Secondary | ICD-10-CM | POA: Insufficient documentation

## 2015-10-26 DIAGNOSIS — R51 Headache: Secondary | ICD-10-CM

## 2015-10-26 DIAGNOSIS — S199XXA Unspecified injury of neck, initial encounter: Secondary | ICD-10-CM | POA: Diagnosis present

## 2015-10-26 DIAGNOSIS — S29001A Unspecified injury of muscle and tendon of front wall of thorax, initial encounter: Secondary | ICD-10-CM | POA: Insufficient documentation

## 2015-10-26 DIAGNOSIS — I10 Essential (primary) hypertension: Secondary | ICD-10-CM | POA: Insufficient documentation

## 2015-10-26 MED ORDER — ACETAMINOPHEN 325 MG PO TABS
650.0000 mg | ORAL_TABLET | Freq: Once | ORAL | Status: AC
  Administered 2015-10-26: 650 mg via ORAL
  Filled 2015-10-26: qty 2

## 2015-10-26 MED ORDER — METHOCARBAMOL 500 MG PO TABS: 1000.0000 mg | ORAL_TABLET | Freq: Four times a day (QID) | ORAL | Status: DC
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
## 2015-11-06 ENCOUNTER — Ambulatory Visit (INDEPENDENT_AMBULATORY_CARE_PROVIDER_SITE_OTHER): Payer: 59 | Admitting: Family Medicine

## 2015-11-06 ENCOUNTER — Encounter: Payer: Self-pay | Admitting: Family Medicine

## 2015-11-06 ENCOUNTER — Ambulatory Visit (INDEPENDENT_AMBULATORY_CARE_PROVIDER_SITE_OTHER)
Admission: RE | Admit: 2015-11-06 | Discharge: 2015-11-06 | Disposition: A | Discharge status: Home / Self Care | Payer: 59 | Source: Ambulatory Visit | Attending: Family Medicine | Admitting: Family Medicine

## 2015-11-06 VITALS — BP 128/79 | HR 72 | Temp 98.5°F | Ht 65.25 in | Wt 186.5 lb

## 2015-11-06 DIAGNOSIS — M545 Low back pain, unspecified: Secondary | ICD-10-CM

## 2015-11-06 DIAGNOSIS — M542 Cervicalgia: Secondary | ICD-10-CM | POA: Diagnosis not present

## 2015-11-06 MED ORDER — CYCLOBENZAPRINE HCL 10 MG PO TABS: 10.0000 mg | ORAL_TABLET | Freq: Three times a day (TID) | ORAL | Status: DC | PRN

## 2015-11-06 MED ORDER — IBUPROFEN 600 MG PO TABS: 600.0000 mg | ORAL_TABLET | Freq: Three times a day (TID) | ORAL | Status: DC | PRN

## 2015-11-06 NOTE — Progress Notes (Signed)
Pre visit review using our clinic review tool, if applicable. No additional management support is needed unless otherwise documented below in the visit note. 
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
## 2015-12-09 ENCOUNTER — Ambulatory Visit: Payer: 59 | Admitting: Family Medicine

## 2015-12-09 ENCOUNTER — Ambulatory Visit (INDEPENDENT_AMBULATORY_CARE_PROVIDER_SITE_OTHER): Payer: 59 | Admitting: Family Medicine

## 2015-12-09 ENCOUNTER — Encounter: Payer: Self-pay | Admitting: Family Medicine

## 2015-12-09 VITALS — BP 124/84 | HR 72 | Temp 98.1°F | Ht 65.25 in | Wt 184.1 lb

## 2015-12-09 DIAGNOSIS — M545 Low back pain, unspecified: Secondary | ICD-10-CM

## 2015-12-09 DIAGNOSIS — M542 Cervicalgia: Secondary | ICD-10-CM | POA: Diagnosis not present

## 2015-12-09 NOTE — Progress Notes (Signed)
Pre visit review using our clinic review tool, if applicable. No additional management support is needed unless otherwise documented below in the visit note. 
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
## 2016-03-31 LAB — HM MAMMOGRAPHY: HM Mammogram: NORMAL (ref 0–4)

## 2016-04-16 ENCOUNTER — Encounter: Payer: Self-pay | Admitting: Family Medicine

## 2016-04-18 ENCOUNTER — Telehealth: Payer: Self-pay | Admitting: Family Medicine

## 2016-04-18 DIAGNOSIS — Z0000 Encounter for general adult medical examination without abnormal findings: Secondary | ICD-10-CM

## 2016-04-18 DIAGNOSIS — E559 Vitamin D deficiency, unspecified: Secondary | ICD-10-CM

## 2016-04-18 NOTE — Telephone Encounter (Signed)
-----   Message from Marchia Bond sent at 04/16/2016  1:17 PM EDT ----- Regarding: Cpx labs Thurs 8/31, need orders. Thanks! :-) Please order  future cpx labs for pt's upcoming lab appt. Thanks Aniceto Boss

## 2016-04-23 ENCOUNTER — Other Ambulatory Visit (INDEPENDENT_AMBULATORY_CARE_PROVIDER_SITE_OTHER): Payer: 59

## 2016-04-23 DIAGNOSIS — Z0000 Encounter for general adult medical examination without abnormal findings: Secondary | ICD-10-CM

## 2016-04-23 DIAGNOSIS — E559 Vitamin D deficiency, unspecified: Secondary | ICD-10-CM

## 2016-04-28 ENCOUNTER — Encounter: Payer: Self-pay | Admitting: Family Medicine

## 2016-04-28 ENCOUNTER — Encounter: Payer: Self-pay | Admitting: Gastroenterology

## 2016-04-28 ENCOUNTER — Ambulatory Visit (INDEPENDENT_AMBULATORY_CARE_PROVIDER_SITE_OTHER): Payer: 59 | Admitting: Family Medicine

## 2016-04-28 VITALS — BP 122/68 | HR 74 | Temp 98.4°F | Ht 64.75 in | Wt 179.2 lb

## 2016-04-28 DIAGNOSIS — Z1211 Encounter for screening for malignant neoplasm of colon: Secondary | ICD-10-CM | POA: Insufficient documentation

## 2016-04-28 DIAGNOSIS — E559 Vitamin D deficiency, unspecified: Secondary | ICD-10-CM

## 2016-04-28 DIAGNOSIS — I10 Essential (primary) hypertension: Secondary | ICD-10-CM | POA: Diagnosis not present

## 2016-04-28 DIAGNOSIS — N920 Excessive and frequent menstruation with regular cycle: Secondary | ICD-10-CM | POA: Insufficient documentation

## 2016-04-28 DIAGNOSIS — Z0000 Encounter for general adult medical examination without abnormal findings: Secondary | ICD-10-CM | POA: Diagnosis not present

## 2016-04-28 DIAGNOSIS — E785 Hyperlipidemia, unspecified: Secondary | ICD-10-CM | POA: Diagnosis not present

## 2016-04-28 DIAGNOSIS — K219 Gastro-esophageal reflux disease without esophagitis: Secondary | ICD-10-CM

## 2016-04-28 MED ORDER — RANITIDINE HCL 150 MG PO TABS: 150.0000 mg | ORAL_TABLET | Freq: Two times a day (BID) | ORAL | 3 refills | Status: DC

## 2016-04-28 MED ORDER — LISINOPRIL 40 MG PO TABS: 40.0000 mg | ORAL_TABLET | Freq: Every day | ORAL | 3 refills | Status: DC

## 2016-04-28 MED ORDER — TORSEMIDE 20 MG PO TABS: 20.0000 mg | ORAL_TABLET | Freq: Every day | ORAL | 3 refills | Status: DC

## 2016-04-28 NOTE — Assessment & Plan Note (Signed)
bp in fair control at this time  BP Readings from Last 1 Encounters:  04/28/16 122/68   No changes needed Disc lifstyle change with low sodium diet and exercise  Labs reviewed  More edema lately -  ? If related -will continue demadex and try supp hose

## 2016-04-28 NOTE — Progress Notes (Signed)
Subjective:    Patient ID: Rachael Russell, female    DOB: 15-Mar-1966, 50 y.o.   MRN: [id number]  HPI   Here for health maintenance exam and to review chronic medical problems    Wt Readings from Last 3 Encounters:  04/28/16 179 lb 4 oz (81.3 kg)  12/09/15 184 lb 1.9 oz (83.5 kg)  11/06/15 186 lb 8 oz (84.6 kg)  is down 5 lb since last weight  She does not think she has changed anything  She is trying to get back to weight watchers - great! , meetings and on line  Exercise is sporatic - with her schedule / has a [employer] membership  bmi is 30.06  Colonoscopy- has not had one yet - is interested in one - will refer for that    Mammogram 03/31/16 normal (she has had lumpectomy)  Self breast exam - no changes/ has fibrocystic dz however   Flu shot - she wants that today  Pap 9/16- negative with neg HPV Having really bad periods- doubled over in pain  Very heavy bleeding and often bleeds twice per month Also menstrual migraine  Does not have a gyn  Husband had a vasectomy  Would like to see gyn   Tetanus shot 8/10   Hx of vit D def- still pending level  She is still taking vitamin D   bp is stable today  No cp or palpitations or headaches or edema  No side effects to medicines  BP Readings from Last 3 Encounters:  04/28/16 122/68  12/09/15 124/84  11/06/15 128/79     Hx of hyperlipidemia Lab Results  Component Value Date   CHOL 207 (H) 04/23/2016   CHOL 237 (H) 04/19/2015   CHOL 206 (H) 04/18/2014   Lab Results  Component Value Date   HDL 65 04/23/2016   HDL 71 04/19/2015   HDL 68 04/18/2014   Lab Results  Component Value Date   LDLCALC 131 (H) 04/23/2016   LDLCALC 152 (H) 04/19/2015   LDLCALC 122 (H) 04/18/2014   Lab Results  Component Value Date   TRIG 55 04/23/2016   TRIG 69 04/19/2015   TRIG 79 04/18/2014   Lab Results  Component Value Date   CHOLHDL 3.2 04/23/2016   CHOLHDL 3.3 04/19/2015   CHOLHDL 3.0 04/18/2014   Lab Results  Component Value  Date   LDLDIRECT 149.9 05/11/2011   LDLDIRECT 123.2 01/03/2007  overall improved with good HDL and lower LDL  Diet controlled  Weight watchers may be helping   More reflux lately  Worse during times of stress  Makes her tight in chest  She is not taking ranitidine every single day     Lab today Lab on 04/23/2016  Component Date Value Ref Range Status  . Vit D, 25-Hydroxy 04/25/2016 WILL FOLLOW   Preliminary  . TSH 04/25/2016 1.610  0.450 - 4.500 uIU/mL Final  . Cholesterol, Total 04/25/2016 207* 100 - 199 mg/dL Final  . Triglycerides 04/25/2016 55  0 - 149 mg/dL Final  . HDL 04/25/2016 65  >39 mg/dL Final  . VLDL Cholesterol Cal 04/25/2016 11  5 - 40 mg/dL Final  . LDL Calculated 04/25/2016 131* 0 - 99 mg/dL Final  . Chol/HDL Ratio 04/25/2016 3.2  0.0 - 4.4 ratio units Final   Comment:  T. Chol/HDL Ratio                                             Men  Women                               1/2 Avg.Risk  3.4    3.3                                   Avg.Risk  5.0    4.4                                2X Avg.Risk  9.6    7.1                                3X Avg.Risk 23.4   11.0   . WBC 04/25/2016 7.1  3.4 - 10.8 x10E3/uL Final  . RBC 04/25/2016 3.74* 3.77 - 5.28 x10E6/uL Final  . Hemoglobin 04/25/2016 11.6  11.1 - 15.9 g/dL Final  . Hematocrit 04/25/2016 34.4  34.0 - 46.6 % Final  . MCV 04/25/2016 92  79 - 97 fL Final  . MCH 04/25/2016 31.0  26.6 - 33.0 pg Final  . MCHC 04/25/2016 33.7  31.5 - 35.7 g/dL Final  . RDW 04/25/2016 12.8  12.3 - 15.4 % Final  . Platelets 04/25/2016 317  150 - 379 x10E3/uL Final  . Neutrophils 04/25/2016 65  % Final  . Lymphs 04/25/2016 25  % Final  . Monocytes 04/25/2016 8  % Final  . Eos 04/25/2016 2  % Final  . Basos 04/25/2016 0  % Final  . Neutrophils Absolute 04/25/2016 4.5  1.4 - 7.0 x10E3/uL Final  . Lymphocytes Absolute 04/25/2016 1.8  0.7 - 3.1 x10E3/uL Final  . Monocytes Absolute 04/25/2016 0.6  0.1 -  0.9 x10E3/uL Final  . EOS (ABSOLUTE) 04/25/2016 0.2  0.0 - 0.4 x10E3/uL Final  . Basophils Absolute 04/25/2016 0.0  0.0 - 0.2 x10E3/uL Final  . Immature Granulocytes 04/25/2016 0  % Final  . Immature Grans (Abs) 04/25/2016 0.0  0.0 - 0.1 x10E3/uL Final  . Glucose 04/25/2016 93  65 - 99 mg/dL Final  . BUN 04/25/2016 12  6 - 24 mg/dL Final  . Creatinine, Ser 04/25/2016 0.88  0.57 - 1.00 mg/dL Final  . GFR calc non Af Amer 04/25/2016 77  >59 mL/min/1.73 Final  . GFR calc Af Amer 04/25/2016 89  >59 mL/min/1.73 Final  . BUN/Creatinine Ratio 04/25/2016 14  9 - 23 Final  . Sodium 04/25/2016 140  134 - 144 mmol/L Final  . Potassium 04/25/2016 4.0  3.5 - 5.2 mmol/L Final  . Chloride 04/25/2016 104  96 - 106 mmol/L Final  . CO2 04/25/2016 19  18 - 29 mmol/L Final  . Calcium 04/25/2016 8.9  8.7 - 10.2 mg/dL Final  . Total Protein 04/25/2016 6.5  6.0 - 8.5 g/dL Final  . Albumin 04/25/2016 3.9  3.5 - 5.5 g/dL Final  . Globulin, Total 04/25/2016 2.6  1.5 - 4.5 g/dL Final  . Albumin/Globulin Ratio 04/25/2016 1.5  1.2 - 2.2 Final  .  Bilirubin Total 04/25/2016 <0.2  0.0 - 1.2 mg/dL Final  . Alkaline Phosphatase 04/25/2016 51  39 - 117 IU/L Final  . AST 04/25/2016 13  0 - 40 IU/L Final  . ALT 04/25/2016 9  0 - 32 IU/L Final        Review of Systems Review of Systems  Constitutional: Negative for fever, appetite change, fatigue and unexpected weight change.  Eyes: Negative for pain and visual disturbance.  Respiratory: Negative for cough and shortness of breath.  pos for swelling in ankles (end of the day)  Cardiovascular: Negative for cp or palpitations    Gastrointestinal: Negative for nausea, diarrhea and constipation. pos for gerd symptoms incl heartburn  Genitourinary: Negative for urgency and frequency. pos for heavy painful menses Skin: Negative for pallor or rash   Neurological: Negative for weakness, light-headedness, numbness and headaches.  Hematological: Negative for adenopathy. Does  not bruise/bleed easily.  Psychiatric/Behavioral: Negative for dysphoric mood. The patient is not nervous/anxious.  pos for stressors        Objective:   Physical Exam  Constitutional: She appears well-developed and well-nourished. No distress.  overwt and well appearing   HENT:  Head: Normocephalic and atraumatic.  Right Ear: External ear normal.  Left Ear: External ear normal.  Mouth/Throat: Oropharynx is clear and moist.  Eyes: Conjunctivae and EOM are normal. Pupils are equal, round, and reactive to light. No scleral icterus.  Neck: Normal range of motion. Neck supple. No JVD present. Carotid bruit is not present. No thyromegaly present.  Cardiovascular: Normal rate, regular rhythm, normal heart sounds and intact distal pulses.  Exam reveals no gallop.   Pulmonary/Chest: Effort normal and breath sounds normal. No respiratory distress. She has no wheezes.  Abdominal: Soft. Bowel sounds are normal. She exhibits no distension, no abdominal bruit and no mass. There is no tenderness. There is no rebound and no guarding.  Genitourinary: No breast swelling, tenderness, discharge or bleeding.  Genitourinary Comments: Breast exam: No mass, nodules, thickening, tenderness, bulging, retraction, inflamation, nipple discharge or skin changes noted.  No axillary or clavicular LA.      Musculoskeletal: Normal range of motion. She exhibits no edema or tenderness.  No kyphosis   Lymphadenopathy:    She has no cervical adenopathy.  Neurological: She is alert. She has normal reflexes. No cranial nerve deficit. She exhibits normal muscle tone. Coordination normal.  Skin: Skin is warm and dry. No rash noted. No erythema. No pallor.  Psychiatric: She has a normal mood and affect.          Assessment & Plan:   Problem List Items Addressed This Visit      Cardiovascular and Mediastinum   Essential hypertension    bp in fair control at this time  BP Readings from Last 1 Encounters:  04/28/16  122/68   No changes needed Disc lifstyle change with low sodium diet and exercise  Labs reviewed  More edema lately -  ? If related -will continue demadex and try supp hose      Relevant Medications   torsemide (DEMADEX) 20 MG tablet   lisinopril (PRINIVIL,ZESTRIL) 40 MG tablet     Digestive   GERD    More chest and throat symptoms lately inst to take her ranitidine bid instead of prn  Also watch diet  F/u if no imp in 1-2 wk or if worse       Relevant Medications   ranitidine (ZANTAC) 150 MG tablet     Other  Vitamin D deficiency    Pending labs- [facility] listed result as pending       Routine general medical examination at a health care facility - Primary    Reviewed health habits including diet and exercise and skin cancer prevention Reviewed appropriate screening tests for age  Also reviewed health mt list, fam hx and immunization status , as well as social and family history   See HPI Labs rev (pending vit D)  Enc further wt loss and more exercise  Ref for screening colonoscopy        Hyperlipidemia    Improved with better diet (on wt watchers)  LDL is 131-disc goal of 130 or less Good HDL Disc goals for lipids and reasons to control them Rev labs with pt Rev low sat fat diet in detail       Relevant Medications   torsemide (DEMADEX) 20 MG tablet   lisinopril (PRINIVIL,ZESTRIL) 40 MG tablet   Colon cancer screening    Referred for first screening colonoscopy      Relevant Orders   Ambulatory referral to Gastroenterology    Other Visit Diagnoses   None.

## 2016-04-28 NOTE — Patient Instructions (Addendum)
Stop at check out for ref for colonoscopy  Flu shot today Increase ranitidine to twice daily (every day)  If no improvement in symptoms in 1-2 week - come back for a visit  Look for some knee highs with support to wear during the day  Keep doing weight watchers    Take care of yourself

## 2016-04-28 NOTE — Assessment & Plan Note (Signed)
Improved with better diet (on wt watchers)  LDL is 131-disc goal of 130 or less Good HDL Disc goals for lipids and reasons to control them Rev labs with pt Rev low sat fat diet in detail

## 2016-04-28 NOTE — Progress Notes (Signed)
Pre visit review using our clinic review tool, if applicable. No additional management support is needed unless otherwise documented below in the visit note. 

## 2016-04-28 NOTE — Assessment & Plan Note (Signed)
More chest and throat symptoms lately inst to take her ranitidine bid instead of prn  Also watch diet  F/u if no imp in 1-2 wk or if worse

## 2016-04-28 NOTE — Assessment & Plan Note (Signed)
Referred for first screening colonoscopy  

## 2016-04-28 NOTE — Assessment & Plan Note (Signed)
Reviewed health habits including diet and exercise and skin cancer prevention Reviewed appropriate screening tests for age  Also reviewed health mt list, fam hx and immunization status , as well as social and family history   See HPI Labs rev (pending vit D)  Enc further wt loss and more exercise  Ref for screening colonoscopy

## 2016-04-28 NOTE — Assessment & Plan Note (Signed)
Pending labs- [facility] listed result as pending

## 2016-04-30 LAB — COMPREHENSIVE METABOLIC PANEL
ALT: 9 IU/L (ref 0–32)
AST: 13 IU/L (ref 0–40)
Albumin/Globulin Ratio: 1.5 (ref 1.2–2.2)
Albumin: 3.9 g/dL (ref 3.5–5.5)
Alkaline Phosphatase: 51 IU/L (ref 39–117)
BUN/Creatinine Ratio: 14 (ref 9–23)
BUN: 12 mg/dL (ref 6–24)
Bilirubin Total: <0.2 mg/dL (ref 0.0–1.2)
CO2: 19 mmol/L (ref 18–29)
Calcium: 8.9 mg/dL (ref 8.7–10.2)
Chloride: 104 mmol/L (ref 96–106)
Creatinine, Ser: 0.88 mg/dL (ref 0.57–1.00)
GFR calc Af Amer: 89 mL/min/{1.73_m2} (ref >59)
GFR calc non Af Amer: 77 mL/min/{1.73_m2} (ref >59)
Globulin, Total: 2.6 g/dL (ref 1.5–4.5)
Glucose: 93 mg/dL (ref 65–99)
Potassium: 4 mmol/L (ref 3.5–5.2)
Sodium: 140 mmol/L (ref 134–144)
Total Protein: 6.5 g/dL (ref 6.0–8.5)

## 2016-04-30 LAB — LIPID PANEL
Chol/HDL Ratio: 3.2 ratio units (ref 0.0–4.4)
Cholesterol, Total: 207 mg/dL — ABNORMAL HIGH (ref 100–199)
HDL: 65 mg/dL (ref >39)
LDL Calculated: 131 mg/dL — ABNORMAL HIGH (ref 0–99)
Triglycerides: 55 mg/dL (ref 0–149)
VLDL Cholesterol Cal: 11 mg/dL (ref 5–40)

## 2016-04-30 LAB — CBC WITH DIFFERENTIAL/PLATELET
Basophils Absolute: 0 10*3/uL (ref 0.0–0.2)
Basos: 0 %
EOS (ABSOLUTE): 0.2 10*3/uL (ref 0.0–0.4)
Eos: 2 %
Hematocrit: 34.4 % (ref 34.0–46.6)
Hemoglobin: 11.6 g/dL (ref 11.1–15.9)
Immature Grans (Abs): 0 10*3/uL (ref 0.0–0.1)
Immature Granulocytes: 0 %
Lymphocytes Absolute: 1.8 10*3/uL (ref 0.7–3.1)
Lymphs: 25 %
MCH: 31 pg (ref 26.6–33.0)
MCHC: 33.7 g/dL (ref 31.5–35.7)
MCV: 92 fL (ref 79–97)
Monocytes Absolute: 0.6 10*3/uL (ref 0.1–0.9)
Monocytes: 8 %
Neutrophils Absolute: 4.5 10*3/uL (ref 1.4–7.0)
Neutrophils: 65 %
Platelets: 317 10*3/uL (ref 150–379)
RBC: 3.74 x10E6/uL — ABNORMAL LOW (ref 3.77–5.28)
RDW: 12.8 % (ref 12.3–15.4)
WBC: 7.1 10*3/uL (ref 3.4–10.8)

## 2016-04-30 LAB — TSH: TSH: 1.61 u[IU]/mL (ref 0.450–4.500)

## 2016-04-30 LAB — VITAMIN D 25 HYDROXY (VIT D DEFICIENCY, FRACTURES): Vit D, 25-Hydroxy: 34.4 ng/mL (ref 30.0–100.0)

## 2016-05-04 ENCOUNTER — Telehealth: Payer: Self-pay | Admitting: Family Medicine

## 2016-05-04 DIAGNOSIS — N921 Excessive and frequent menstruation with irregular cycle: Secondary | ICD-10-CM

## 2016-05-04 NOTE — Telephone Encounter (Signed)
Pt req gyn ref at last visit for heavy menses Will do ref and route to [facility]

## 2016-05-06 ENCOUNTER — Telehealth: Payer: Self-pay

## 2016-05-06 NOTE — Telephone Encounter (Signed)
Pt left /vm; pt got wellness rewards results back from work and it suggest pt have appt with PCP to discuss plan for pts wellness (weight loss also) so pt can get insurance deductions where pt works. Pt had annual exam on 04/28/16. Pt request cb to see if Dr Glori Bickers wants pt to schedule appt .

## 2016-05-06 NOTE — Telephone Encounter (Signed)
I don't think she really needs it.  We discussed wellness at her annual exam in length and also plan for weight loss (weight watchers and more frequent exercise)

## 2016-05-06 NOTE — Telephone Encounter (Signed)
Called patient and she will make her own appt and will call us back.

## 2016-05-07 NOTE — Telephone Encounter (Signed)
Pt notified of Dr. Marliss Coots comments. Pt said their is a form her insurance makes them fill out saying this info was discussed. She will fax it over now

## 2016-05-07 NOTE — Telephone Encounter (Signed)
Left message on voicemail to call back, 

## 2016-05-08 NOTE — Telephone Encounter (Signed)
Done and in IN box 

## 2016-05-08 NOTE — Telephone Encounter (Signed)
Form received and placed in Dr. Marliss Coots inbox

## 2016-05-11 NOTE — Telephone Encounter (Signed)
Called pt and she decided she would like to pick up form instead of me faxing it, form placed at the front desk for pick-up

## 2016-06-02 ENCOUNTER — Encounter: Payer: Self-pay | Admitting: Gastroenterology

## 2016-07-02 ENCOUNTER — Encounter: Payer: 59 | Admitting: Gastroenterology

## 2016-07-03 ENCOUNTER — Encounter: Payer: 59 | Admitting: Gastroenterology

## 2016-07-27 ENCOUNTER — Other Ambulatory Visit: Payer: Self-pay | Admitting: Obstetrics & Gynecology

## 2016-07-29 ENCOUNTER — Other Ambulatory Visit: Payer: Self-pay | Admitting: Obstetrics & Gynecology

## 2016-08-03 ENCOUNTER — Encounter (HOSPITAL_BASED_OUTPATIENT_CLINIC_OR_DEPARTMENT_OTHER): Payer: Self-pay | Admitting: *Deleted

## 2016-08-03 NOTE — Progress Notes (Signed)
NPO AFTER MN.  ARRIVE AT 1030.  NEEDS CBC, BMET, URINE PREG., AND EKG.

## 2016-08-05 ENCOUNTER — Encounter (HOSPITAL_BASED_OUTPATIENT_CLINIC_OR_DEPARTMENT_OTHER)
Admission: RE | Disposition: A | Discharge status: Home / Self Care | Payer: Self-pay | Source: Ambulatory Visit | Attending: Obstetrics & Gynecology

## 2016-08-05 ENCOUNTER — Ambulatory Visit (HOSPITAL_BASED_OUTPATIENT_CLINIC_OR_DEPARTMENT_OTHER): Payer: 59 | Admitting: Anesthesiology

## 2016-08-05 ENCOUNTER — Encounter (HOSPITAL_BASED_OUTPATIENT_CLINIC_OR_DEPARTMENT_OTHER): Payer: Self-pay | Admitting: Anesthesiology

## 2016-08-05 ENCOUNTER — Ambulatory Visit (HOSPITAL_BASED_OUTPATIENT_CLINIC_OR_DEPARTMENT_OTHER)
Admission: RE | Admit: 2016-08-05 | Discharge: 2016-08-05 | Disposition: A | Discharge status: Home / Self Care | Payer: 59 | Source: Ambulatory Visit | Attending: Obstetrics & Gynecology | Admitting: Obstetrics & Gynecology

## 2016-08-05 DIAGNOSIS — N920 Excessive and frequent menstruation with regular cycle: Secondary | ICD-10-CM | POA: Diagnosis not present

## 2016-08-05 DIAGNOSIS — I10 Essential (primary) hypertension: Secondary | ICD-10-CM | POA: Diagnosis not present

## 2016-08-05 DIAGNOSIS — E785 Hyperlipidemia, unspecified: Secondary | ICD-10-CM | POA: Insufficient documentation

## 2016-08-05 DIAGNOSIS — Z880 Allergy status to penicillin: Secondary | ICD-10-CM | POA: Diagnosis not present

## 2016-08-05 DIAGNOSIS — K219 Gastro-esophageal reflux disease without esophagitis: Secondary | ICD-10-CM | POA: Insufficient documentation

## 2016-08-05 DIAGNOSIS — N800 Endometriosis of uterus: Secondary | ICD-10-CM | POA: Diagnosis not present

## 2016-08-05 HISTORY — DX: Family history of other specified conditions: Z84.89

## 2016-08-05 HISTORY — DX: Diffuse cystic mastopathy of right breast: N60.11

## 2016-08-05 HISTORY — DX: Benign neoplasm of left breast: D24.2

## 2016-08-05 HISTORY — PX: DILITATION & CURRETTAGE/HYSTROSCOPY WITH NOVASURE ABLATION: SHX5568

## 2016-08-05 HISTORY — DX: Excessive and frequent menstruation with regular cycle: N92.0

## 2016-08-05 LAB — CBC
HCT: 38.2 % (ref 36.0–46.0)
Hemoglobin: 12.9 g/dL (ref 12.0–15.0)
MCH: 31.4 pg (ref 26.0–34.0)
MCHC: 33.8 g/dL (ref 30.0–36.0)
MCV: 92.9 fL (ref 78.0–100.0)
Platelets: 363 10*3/uL (ref 150–400)
RBC: 4.11 MIL/uL (ref 3.87–5.11)
RDW: 13 % (ref 11.5–15.5)
WBC: 8.1 10*3/uL (ref 4.0–10.5)

## 2016-08-05 LAB — BASIC METABOLIC PANEL
Anion gap: 9 (ref 5–15)
BUN: 14 mg/dL (ref 6–20)
CO2: 25 mmol/L (ref 22–32)
Calcium: 8.7 mg/dL — ABNORMAL LOW (ref 8.9–10.3)
Chloride: 106 mmol/L (ref 101–111)
Creatinine, Ser: 0.97 mg/dL (ref 0.44–1.00)
GFR calc Af Amer: >60 mL/min (ref >60)
GFR calc non Af Amer: >60 mL/min (ref >60)
Glucose, Bld: 95 mg/dL (ref 65–99)
Potassium: 3.4 mmol/L — ABNORMAL LOW (ref 3.5–5.1)
Sodium: 140 mmol/L (ref 135–145)

## 2016-08-05 LAB — POCT PREGNANCY, URINE: Preg Test, Ur: NEGATIVE

## 2016-08-05 SURGERY — DILATATION & CURETTAGE/HYSTEROSCOPY WITH NOVASURE ABLATION
Anesthesia: General | Site: Uterus

## 2016-08-05 MED ORDER — METOCLOPRAMIDE HCL 5 MG/ML IJ SOLN
INTRAMUSCULAR | Status: DC | PRN
  Administered 2016-08-05: 10 mg via INTRAVENOUS

## 2016-08-05 MED ORDER — GENTAMICIN SULFATE 40 MG/ML IJ SOLN
5.0000 mg/kg | INTRAVENOUS | Status: DC
  Filled 2016-08-05: qty 8.5

## 2016-08-05 MED ORDER — DEXAMETHASONE SODIUM PHOSPHATE 10 MG/ML IJ SOLN
INTRAMUSCULAR | Status: AC
  Filled 2016-08-05: qty 1

## 2016-08-05 MED ORDER — OXYCODONE-ACETAMINOPHEN 7.5-325 MG PO TABS: 1.0000 | ORAL_TABLET | ORAL | 0 refills | Status: DC | PRN

## 2016-08-05 MED ORDER — HYDROCODONE-ACETAMINOPHEN 7.5-325 MG PO TABS
1.0000 | ORAL_TABLET | Freq: Once | ORAL | Status: DC | PRN
  Filled 2016-08-05: qty 1

## 2016-08-05 MED ORDER — KETOROLAC TROMETHAMINE 30 MG/ML IJ SOLN
INTRAMUSCULAR | Status: DC | PRN
  Administered 2016-08-05: 30 mg via INTRAVENOUS

## 2016-08-05 MED ORDER — MIDAZOLAM HCL 5 MG/5ML IJ SOLN
INTRAMUSCULAR | Status: DC | PRN
  Administered 2016-08-05: 2 mg via INTRAVENOUS

## 2016-08-05 MED ORDER — METRONIDAZOLE IN NACL 5-0.79 MG/ML-% IV SOLN
INTRAVENOUS | Status: AC
  Filled 2016-08-05: qty 100

## 2016-08-05 MED ORDER — CHLOROPROCAINE HCL 1 % IJ SOLN
INTRAMUSCULAR | Status: DC | PRN
  Administered 2016-08-05: 20 mL

## 2016-08-05 MED ORDER — PROPOFOL 10 MG/ML IV BOLUS
INTRAVENOUS | Status: DC | PRN
  Administered 2016-08-05: 50 mg via INTRAVENOUS
  Administered 2016-08-05: 200 mg via INTRAVENOUS

## 2016-08-05 MED ORDER — OXYCODONE-ACETAMINOPHEN 5-325 MG PO TABS
ORAL_TABLET | ORAL | Status: AC
  Filled 2016-08-05: qty 1

## 2016-08-05 MED ORDER — LIDOCAINE 2% (20 MG/ML) 5 ML SYRINGE
INTRAMUSCULAR | Status: AC
  Filled 2016-08-05: qty 5

## 2016-08-05 MED ORDER — CEFAZOLIN SODIUM-DEXTROSE 2-4 GM/100ML-% IV SOLN
2.0000 g | INTRAVENOUS | Status: DC
  Filled 2016-08-05: qty 100

## 2016-08-05 MED ORDER — MIDAZOLAM HCL 2 MG/2ML IJ SOLN
INTRAMUSCULAR | Status: AC
  Filled 2016-08-05: qty 2

## 2016-08-05 MED ORDER — KETOROLAC TROMETHAMINE 30 MG/ML IJ SOLN
INTRAMUSCULAR | Status: AC
  Filled 2016-08-05: qty 1

## 2016-08-05 MED ORDER — DEXAMETHASONE SODIUM PHOSPHATE 4 MG/ML IJ SOLN
INTRAMUSCULAR | Status: DC | PRN
  Administered 2016-08-05: 10 mg via INTRAVENOUS

## 2016-08-05 MED ORDER — ONDANSETRON HCL 4 MG/2ML IJ SOLN
INTRAMUSCULAR | Status: AC
  Filled 2016-08-05: qty 2

## 2016-08-05 MED ORDER — FENTANYL CITRATE (PF) 100 MCG/2ML IJ SOLN
25.0000 ug | INTRAMUSCULAR | Status: DC | PRN
  Administered 2016-08-05: 25 ug via INTRAVENOUS
  Administered 2016-08-05: 50 ug via INTRAVENOUS
  Filled 2016-08-05: qty 1

## 2016-08-05 MED ORDER — MEPERIDINE HCL 25 MG/ML IJ SOLN
6.2500 mg | INTRAMUSCULAR | Status: DC | PRN
  Filled 2016-08-05: qty 1

## 2016-08-05 MED ORDER — FENTANYL CITRATE (PF) 100 MCG/2ML IJ SOLN
INTRAMUSCULAR | Status: AC
Start: 2016-08-05 — End: 2016-08-05
  Filled 2016-08-05: qty 2

## 2016-08-05 MED ORDER — ONDANSETRON HCL 4 MG/2ML IJ SOLN
INTRAMUSCULAR | Status: DC | PRN
  Administered 2016-08-05: 4 mg via INTRAVENOUS

## 2016-08-05 MED ORDER — SILVER NITRATE-POT NITRATE 75-25 % EX MISC
CUTANEOUS | Status: AC
  Filled 2016-08-05: qty 1

## 2016-08-05 MED ORDER — GENTAMICIN SULFATE 40 MG/ML IJ SOLN
5.0000 mg/kg | INTRAVENOUS | Status: DC
  Administered 2016-08-05: 340 mg via INTRAVENOUS
  Filled 2016-08-05: qty 10.25

## 2016-08-05 MED ORDER — FENTANYL CITRATE (PF) 100 MCG/2ML IJ SOLN
INTRAMUSCULAR | Status: AC
  Filled 2016-08-05: qty 2

## 2016-08-05 MED ORDER — CHLOROPROCAINE HCL 1 % IJ SOLN
INTRAMUSCULAR | Status: AC
  Filled 2016-08-05: qty 30

## 2016-08-05 MED ORDER — SILVER NITRATE-POT NITRATE 75-25 % EX MISC
CUTANEOUS | Status: DC | PRN
  Administered 2016-08-05: 1

## 2016-08-05 MED ORDER — METRONIDAZOLE IN NACL 5-0.79 MG/ML-% IV SOLN
500.0000 mg | INTRAVENOUS | Status: AC
  Administered 2016-08-05: 500 mg via INTRAVENOUS
  Filled 2016-08-05: qty 100

## 2016-08-05 MED ORDER — PROPOFOL 10 MG/ML IV BOLUS
INTRAVENOUS | Status: AC
  Filled 2016-08-05: qty 20

## 2016-08-05 MED ORDER — METOCLOPRAMIDE HCL 5 MG/ML IJ SOLN
INTRAMUSCULAR | Status: AC
  Filled 2016-08-05: qty 2

## 2016-08-05 MED ORDER — METOCLOPRAMIDE HCL 5 MG/ML IJ SOLN
10.0000 mg | Freq: Once | INTRAMUSCULAR | Status: DC | PRN
  Filled 2016-08-05: qty 2

## 2016-08-05 MED ORDER — LIDOCAINE HCL (CARDIAC) 20 MG/ML IV SOLN
INTRAVENOUS | Status: DC | PRN
  Administered 2016-08-05: 60 mg via INTRAVENOUS

## 2016-08-05 MED ORDER — FENTANYL CITRATE (PF) 100 MCG/2ML IJ SOLN
INTRAMUSCULAR | Status: DC | PRN
  Administered 2016-08-05 (×2): 25 ug via INTRAVENOUS
  Administered 2016-08-05: 50 ug via INTRAVENOUS

## 2016-08-05 MED ORDER — OXYCODONE-ACETAMINOPHEN 5-325 MG PO TABS
1.0000 | ORAL_TABLET | Freq: Once | ORAL | Status: AC
  Administered 2016-08-05: 1 via ORAL
  Filled 2016-08-05: qty 1

## 2016-08-05 MED ORDER — LACTATED RINGERS IV SOLN
INTRAVENOUS | Status: DC
  Administered 2016-08-05 (×2): via INTRAVENOUS
  Filled 2016-08-05: qty 1000

## 2016-08-05 SURGICAL SUPPLY — 32 items
ABLATOR ENDOMETRIAL BIPOLAR (ABLATOR) IMPLANT
BIPOLAR CUTTING LOOP 21FR (ELECTRODE)
CANISTER SUCT 3000ML (MISCELLANEOUS) ×2 IMPLANT
CATH ROBINSON RED A/P 16FR (CATHETERS) ×2 IMPLANT
CYLINDER CO2 NOVASURE GENER (MISCELLANEOUS) IMPLANT
DEVICE MYOSURE LITE (MISCELLANEOUS) ×1 IMPLANT
DILATOR CANAL MILEX (MISCELLANEOUS) IMPLANT
DRAPE LG THREE QUARTER DISP (DRAPES) ×2 IMPLANT
DRSG TELFA 3X8 NADH (GAUZE/BANDAGES/DRESSINGS) ×2 IMPLANT
ELECT REM PT RETURN 9FT ADLT (ELECTROSURGICAL) ×2
ELECTRODE REM PT RTRN 9FT ADLT (ELECTROSURGICAL) ×1 IMPLANT
GAUZE VASELINE 1X8 (GAUZE/BANDAGES/DRESSINGS) IMPLANT
GLOVE BIO SURGEON STRL SZ 6.5 (GLOVE) ×2 IMPLANT
GLOVE BIOGEL PI IND STRL 7.0 (GLOVE) ×1 IMPLANT
GLOVE BIOGEL PI INDICATOR 7.0 (GLOVE) ×1
GOWN STRL REUS W/ TWL XL LVL3 (GOWN DISPOSABLE) ×1 IMPLANT
GOWN STRL REUS W/TWL XL LVL3 (GOWN DISPOSABLE) ×2
KIT ROOM TURNOVER WOR (KITS) ×2 IMPLANT
LEGGING LITHOTOMY PAIR STRL (DRAPES) ×2 IMPLANT
LOOP CUTTING BIPOLAR 21FR (ELECTRODE) IMPLANT
MANIFOLD NEPTUNE II (INSTRUMENTS) IMPLANT
NOVASURE GENER CO2 CL (MISCELLANEOUS) ×2
PACK BASIN DAY SURGERY FS (CUSTOM PROCEDURE TRAY) ×2 IMPLANT
PAD DRESSING TELFA 3X8 NADH (GAUZE/BANDAGES/DRESSINGS) ×1 IMPLANT
PAD OB MATERNITY 4.3X12.25 (PERSONAL CARE ITEMS) ×2 IMPLANT
PAD PREP 24X48 CUFFED NSTRL (MISCELLANEOUS) ×2 IMPLANT
TOWEL OR 17X24 6PK STRL BLUE (TOWEL DISPOSABLE) ×4 IMPLANT
TRAY DSU PREP LF (CUSTOM PROCEDURE TRAY) ×2 IMPLANT
TUBE CONNECTING 12X1/4 (SUCTIONS) IMPLANT
TUBING AQUILEX INFLOW (TUBING) ×2 IMPLANT
TUBING AQUILEX OUTFLOW (TUBING) ×2 IMPLANT
WATER STERILE IRR 500ML POUR (IV SOLUTION) ×2 IMPLANT

## 2016-08-05 NOTE — Anesthesia Preprocedure Evaluation (Addendum)
Anesthesia Evaluation  Patient identified by MRN, date of birth, ID band Patient awake    Reviewed: Allergy & Precautions, NPO status , Patient's Chart, lab work & pertinent test results  History of Anesthesia Complications (+) Family history of anesthesia reaction  Airway Mallampati: III  TM Distance: >3 FB Neck ROM: Full    Dental no notable dental hx. (+) Teeth Intact, Dental Advisory Given   Pulmonary neg pulmonary ROS,    Pulmonary exam normal breath sounds clear to auscultation       Cardiovascular Exercise Tolerance: Good hypertension, Pt. on medications Normal cardiovascular exam Rhythm:Regular Rate:Normal     Neuro/Psych negative neurological ROS  negative psych ROS   GI/Hepatic Neg liver ROS, GERD  Medicated and Controlled,  Endo/Other  Obesity Hyperlipidemia  Renal/GU negative Renal ROS  negative genitourinary   Musculoskeletal negative musculoskeletal ROS (+)   Abdominal (+) + obese,   Peds  Hematology  (+) anemia ,   Anesthesia Other Findings   Reproductive/Obstetrics Menorrhagia                            Anesthesia Physical Anesthesia Plan  ASA: II  Anesthesia Plan: General   Post-op Pain Management:    Induction: Intravenous  Airway Management Planned: LMA  Additional Equipment:   Intra-op Plan:   Post-operative Plan: Extubation in OR  Informed Consent: I have reviewed the patients History and Physical, chart, labs and discussed the procedure including the risks, benefits and alternatives for the proposed anesthesia with the patient or authorized representative who has indicated his/her understanding and acceptance.   Dental advisory given  Plan Discussed with: Anesthesiologist, CRNA and Surgeon  Anesthesia Plan Comments:         Anesthesia Quick Evaluation

## 2016-08-05 NOTE — Discharge Summary (Signed)
Physician Discharge Summary  Patient ID: Rachael Russell MRN: [id number] DOB/AGE: Jan 27, 1966 50 y.o.  Admit date: 08/05/2016 Discharge date: 08/05/2016  Admission Diagnoses: Menorrhagia  Discharge Diagnoses: Menorrhagia, Polyp, Submucosal myoma        Active Problems:   * No active hospital problems. *   Discharged Condition: good  Hospital Course: outpatient  Consults: None  Treatments: surgery: Hysteroscopy with D+C, Myosure resection, Novasure Endometrial Ablation  Disposition: 01-Home or Self Care     Medication List    TAKE these medications   CAMILA 0.35 MG tablet Generic drug:  norethindrone Take 1 tablet by mouth daily.   Cholecalciferol 2000 units Tabs Take 1 tablet by mouth daily.   ferrous sulfate 325 (65 FE) MG tablet Take 325 mg by mouth daily with breakfast.   lisinopril 40 MG tablet Commonly known as:  PRINIVIL,ZESTRIL Take 1 tablet (40 mg total) by mouth daily. What changed:  when to take this   oxyCODONE-acetaminophen 7.5-325 MG tablet Commonly known as:  PERCOCET Take 1 tablet by mouth every 4 (four) hours as needed for severe pain.   ranitidine 150 MG tablet Commonly known as:  ZANTAC Take 1 tablet (150 mg total) by mouth 2 (two) times daily.   torsemide 20 MG tablet Commonly known as:  DEMADEX Take 1 tablet (20 mg total) by mouth daily. What changed:  when to take this      Follow-up Information    Princess Bruins, MD Follow up in 3 week(s).   Specialty:  Obstetrics and Gynecology Contact information: [place] [address] [phone]           Signed: Princess Bruins, MD 08/05/2016, 1:25 PM

## 2016-08-05 NOTE — H&P (Signed)
Rachael Russell is an 50 y.o. female   RP:  Menorrhagia for HSC/Resection/D+C/Novasure Endometrial Ablation  Pertinent Gynecological History: Menses: flow is excessive with use of many pads or tampons on heaviest days Blood transfusions: none Last mammogram: normal  Last pap: normal    Menstrual History:  Patient's last menstrual period was 07/15/2016 (exact date).    Past Medical History:  Diagnosis Date  . Family history of adverse reaction to anesthesia    mother has difficult intubation  . Fibroadenoma of breast, left   . Fibrocystic breast, right   . GERD (gastroesophageal reflux disease)   . Hyperlipidemia   . Hypertension   . Menorrhagia   . Wears glasses     Past Surgical History:  Procedure Laterality Date  . BREAST DUCTAL SYSTEM EXCISION Left 11/18/2012   Procedure: EXCISION DUCTAL SYSTEM BREAST;  Surgeon: [name], MD;  Location: [place];  Service: General;  Laterality: Left;  . BREAST LUMPECTOMY WITH NEEDLE LOCALIZATION Left 11/18/2012   Procedure: excise ductal system left breast. subarealar. left partial mastectomy with radiographic guidance;  Surgeon: [name], MD;  Location: [place];  Service: General;  Laterality: Left;  excise ductal system left breast. subarealar. left partial mastectomy with radiographic guidance  . BREAST LUMPECTOMY WITH NEEDLE LOCALIZATION Left 03/27/2015   Procedure: LEFT BREAST LUMPECTOMY  WITH NEEDLE LOCALIZATION TIMES TWO;  Surgeon: Fanny Skates, MD;  Location: [place];  Service: General;  Laterality: Left;  . CESAREAN SECTION  [id number]  . DILATION AND CURETTAGE OF UTERUS  1998  . TONSILLECTOMY  age 6  . UMBILICAL HERNIA REPAIR  2006    Family History  Problem Relation Age of Onset  . Hypertension Mother   . Arthritis Mother     osteoarthritis  . Breast cancer Mother   . Hypertension Father   . Arthritis Father     osteoarthritis  . Cancer Maternal Grandmother     breast     Social History:  reports that she has never smoked. She has never used smokeless tobacco. She reports that she drinks alcohol. She reports that she does not use drugs.  Allergies:  Allergies  Allergen Reactions  . Ferrous Sulfate     constipation  . Penicillins Other (See Comments)    Unknown childhood reaction  . Prilosec [Omeprazole Magnesium]     Dizziness     ROS  Neg  Last menstrual period 07/15/2016. Physical Exam   [facility]elvic [facility] at [facility]    Assessment/Plan: Menorrhagia for HSC/Resection/D+C/Endometrial Ablation.  Surgery and risks reviewed.  Marie-Lyne Alvah Gilder 08/05/2016, 11:24 AM

## 2016-08-05 NOTE — Progress Notes (Signed)
Rachael Russell is an 50 y.o. female   RP:  Menorrhagia for HSC/Resection/D+C/Novasure Endometrial Ablation  Pertinent Gynecological History: Menses: flow is excessive with use of many pads or tampons on heaviest days Blood transfusions: none Last mammogram: normal  Last pap: normal    Menstrual History:  Patient's last menstrual period was 07/15/2016 (exact date).    Past Medical History:  Diagnosis Date  . Family history of adverse reaction to anesthesia    mother has difficult intubation  . Fibroadenoma of breast, left   . Fibrocystic breast, right   . GERD (gastroesophageal reflux disease)   . Hyperlipidemia   . Hypertension   . Menorrhagia   . Wears glasses     Past Surgical History:  Procedure Laterality Date  . BREAST DUCTAL SYSTEM EXCISION Left 11/18/2012   Procedure: EXCISION DUCTAL SYSTEM BREAST;  Surgeon: [name], MD;  Location: [place];  Service: General;  Laterality: Left;  . BREAST LUMPECTOMY WITH NEEDLE LOCALIZATION Left 11/18/2012   Procedure: excise ductal system left breast. subarealar. left partial mastectomy with radiographic guidance;  Surgeon: [name], MD;  Location: [place];  Service: General;  Laterality: Left;  excise ductal system left breast. subarealar. left partial mastectomy with radiographic guidance  . BREAST LUMPECTOMY WITH NEEDLE LOCALIZATION Left 03/27/2015   Procedure: LEFT BREAST LUMPECTOMY  WITH NEEDLE LOCALIZATION TIMES TWO;  Surgeon: Fanny Skates, MD;  Location: [place];  Service: General;  Laterality: Left;  . CESAREAN SECTION  [id number]  . DILATION AND CURETTAGE OF UTERUS  1998  . TONSILLECTOMY  age 47  . UMBILICAL HERNIA REPAIR  2006    Family History  Problem Relation Age of Onset  . Hypertension Mother   . Arthritis Mother     osteoarthritis  . Breast cancer Mother   . Hypertension Father   . Arthritis Father     osteoarthritis  . Cancer Maternal Grandmother     breast     Social History:  reports that she has never smoked. She has never used smokeless tobacco. She reports that she drinks alcohol. She reports that she does not use drugs.  Allergies:  Allergies  Allergen Reactions  . Ferrous Sulfate     constipation  . Penicillins Other (See Comments)    Unknown childhood reaction  . Prilosec [Omeprazole Magnesium]     Dizziness     ROS  Neg  Last menstrual period 07/15/2016. Physical Exam   [facility] at [facility]    Assessment/Plan: Menorrhagia for HSC/Resection/D+C/Endometrial Ablation.  Surgery and risks reviewed.  [name] 08/05/2016, 11:24 AM

## 2016-08-05 NOTE — Transfer of Care (Addendum)
Last Vitals:  Vitals:   08/05/16 1045  BP: (!) 148/96  Pulse: 71  Resp: 16  Temp: 36.3 C    Last Pain:  Vitals:   08/05/16 1045  TempSrc: Oral         Immediate Anesthesia Transfer of Care Note  Patient: Rachael Russell  Procedure(s) Performed: Procedure(s) (LRB): DILATATION & CURETTAGE/HYSTEROSCOPY WITH NOVASURE ABLATION (N/A)  Patient Location: PACU  Anesthesia Type: General  Level of Consciousness: awake, alert  and oriented  Airway & Oxygen Therapy: Patient Spontanous Breathing and Patient connected to nasal cannula oxygen  Post-op Assessment: Report given to PACU RN and Post -op Vital signs reviewed and stable  Post vital signs: Reviewed and stable  Complications: No apparent anesthesia complications

## 2016-08-05 NOTE — Op Note (Signed)
08/05/2016  1:15 PM  PATIENT:  Rachael Russell  50 y.o. female  PRE-OPERATIVE DIAGNOSIS:  Menorrhagia  POST-OPERATIVE DIAGNOSIS:  Menorrhagia with polyp and submucosal myoma  PROCEDURE:  Procedure(s): DILATATION & CURETTAGE/HYSTEROSCOPY WITH NOVASURE ABLATION  SURGEON:  Surgeon(s): Princess Bruins, MD  ASSISTANTS: none   ANESTHESIA:   general   PROCEDURE:  Under general anesthesia with laryngeal mask the patient is in lithotomy position. She is prepped with Betadine on the suprapubic, vulvar and vaginal areas. She is draped as usual. The bladder is catheterized. The uterus is in retroverted position slightly increased in size no adnexal mass. The speculum is inserted in the vagina and the anterior lip of the cervix was grasped with a tenaculum.  A paracervical block is done with Nesacaine 1% a total of 20 cc at 4 and 8:00. The hysterometry is at 10 cm with the cervix at 3.5 cm for a cavity length of 6.5 cm. Dilation of the cervix with Hegar dilators up to #23 without difficulty. The diagnostic hysteroscope is inserted in the intrauterine cavity revealing one small polyp on the left anterior side of the cavity and a small submucosal myoma on the right lower aspect.  The diagnostic hysteroscope is removed.  An endometrial curettage is done with a sharp curette on all endometrial surfaces.  We then used Myosure for resection of the polyp and submucosal myoma as well as resection of areas of endometrial thickness.  The Myosure instrument is removed.  We then inserted the NovaSure instrument in the intrauterine cavity and measured the cavity width at 4.5 cm.  The security test is passed.  We proceed with NovaSure endometrial ablation at a power of 161 W for 1 minute and 14 seconds.  The NovaSure instrument is then removed. The tenaculum is removed from the cervix.  Silver nitrate is used to control hemostasis at that level. The speculum is removed.  The patient is brought to recovery room in good and  stable status.  ESTIMATED BLOOD LOSS: 5 cc  FLUID DEFICIT:  285 cc   Intake/Output Summary (Last 24 hours) at 08/05/16 1315 Last data filed at 08/05/16 1304  Gross per 24 hour  Intake             1000 ml  Output                5 ml  Net              995 ml     BLOOD ADMINISTERED:none   LOCAL MEDICATIONS USED:  Nesacaine, Amount: 20 ml  SPECIMEN:  Source of Specimen:  Endometrial resection (polyp/myoma) and curettings  DISPOSITION OF SPECIMEN:  PATHOLOGY  COUNTS:  YES  PLAN OF CARE: Transfer to PACU  Princess Bruins MD  08/05/2016 at 1:17 pm

## 2016-08-05 NOTE — Anesthesia Procedure Notes (Addendum)
Procedure Name: LMA Insertion Date/Time: 08/05/2016 12:20 PM Performed by: Josephine Igo Pre-anesthesia Checklist: Patient identified, Emergency Drugs available, Suction available and Patient being monitored Patient Re-evaluated:Patient Re-evaluated prior to inductionOxygen Delivery Method: Circle system utilized Preoxygenation: Pre-oxygenation with 100% oxygen Intubation Type: IV induction Ventilation: Mask ventilation without difficulty LMA: LMA inserted LMA Size: 4.0 Number of attempts: 1 Airway Equipment and Method: Bite block Placement Confirmation: positive ETCO2 Tube secured with: Tape Dental Injury: Teeth and Oropharynx as per pre-operative assessment  Comments: LMA insertion by Marthann Schiller

## 2016-08-05 NOTE — Discharge Instructions (Addendum)
Endometrial Ablation °Endometrial ablation removes the lining of the uterus (endometrium). It is usually a same-day, outpatient treatment. Ablation helps avoid major surgery, such as surgery to remove the cervix and uterus (hysterectomy). After endometrial ablation, you will have little or no menstrual bleeding and may not be able to have children. However, if you are premenopausal, you will need to use a reliable method of birth control following the procedure because of the small chance that pregnancy can occur. °There are different reasons to have this procedure. These reasons include: °· Heavy periods. °· Bleeding that is causing anemia. °· Irregular bleeding. °· Bleeding fibroids on the lining inside the uterus if they are smaller than 3 centimeters. °This procedure may not be possible for you if:  °· You want to have children in the future.   °· You have severe cramps with your menstrual period.   °· You have precancerous or cancerous cells in your uterus.   °· You were recently pregnant.   °· You have gone through menopause.   °· You have had major surgery on your uterus, resulting in thinning of the uterine wall. Surgeries may include: °¨ The removal of one or more uterine fibroids (myomectomy). °¨ A cesarean section with a classic (vertical) incision on your uterus. Ask your health care provider what type of cesarean you had. Sometimes the scar on your skin is different than the scar on your uterus. °Even if you have had surgery on your uterus, certain types of ablation may still be safe for you. Talk with your health care provider. °LET YOUR HEALTH CARE PROVIDER KNOW ABOUT: °· Any allergies you have. °· All medicines you are taking, including vitamins, herbs, eye drops, creams, and over-the-counter medicines. °· Previous problems you or members of your family have had with the use of anesthetics. °· Any blood disorders you have. °· Previous surgeries you have had. °· Medical conditions you have. °RISKS AND  COMPLICATIONS  °Generally, this is a safe procedure. However, as with any procedure, complications can occur. Possible complications include: °· Perforation of the uterus. °· Bleeding. °· Infection of the uterus, bladder, or vagina. °· Injury to surrounding organs. °· An air bubble to the lung (air embolus). °· Pregnancy following the procedure. °· Failure of the procedure to help the problem, requiring hysterectomy. °· Decreased ability to diagnose cancer in the lining of the uterus. °BEFORE THE PROCEDURE °· The lining of the uterus must be tested to make sure there is no pre-cancerous or cancer cells present. °· An ultrasound may be performed to look at the size of the uterus and to check for abnormalities. °· Medicines may be given to thin the lining of the uterus. °PROCEDURE  °During the procedure, your health care provider will use a tool called a resectoscope to help see inside your uterus. There are different ways to remove the lining of your uterus.  °· Radiofrequency - This method uses a radiofrequency-alternating electric current to remove the lining of the uterus. °· Cryotherapy - This method uses extreme cold to freeze the lining of the uterus. °· Heated-Free Liquid - This method uses heated salt (saline) solution to remove the lining of the uterus. °· Microwave - This method uses high-energy microwaves to heat up the lining of the uterus to remove it. °· Thermal balloon - This method involves inserting a catheter with a balloon tip into the uterus. The balloon tip is filled with heated fluid to remove the lining of the uterus. °AFTER THE PROCEDURE  °After your procedure, do   not have sexual intercourse or insert anything into your vagina until permitted by your health care provider. After the procedure, you may experience:  Cramps.  Vaginal discharge.  Frequent urination. This information is not intended to replace advice given to you by your health care provider. Make sure you discuss any  questions you have with your health care provider. Document Released: 06/19/2004 Document Revised: 05/01/2015 Document Reviewed: 01/11/2013 Elsevier Interactive Patient Education  2017 [place] care Instructions:   Personal hygiene:  Used sanitary napkins for vaginal drainage not tampons. Shower or tub bathe the day after your procedure. No douching until bleeding stops. Always wipe from front to back after  Elimination.  Activity: Do not drive or operate any equipment today. The effects of the anesthesia are still present and drowsiness may result. Rest today, not necessarily flat bed rest, just take it easy. You may resume your normal activity in one to 2 days.  Sexual activity: No intercourse for one week or as indicated by your physician  Diet: Eat a light diet as desired this evening. You may resume a regular diet tomorrow.  Return to work: One to 2 days.  General Expectations of your surgery: Vaginal bleeding should be no heavier than a normal period. Spotting may continue up to 10 days. Mild cramps may continue for a couple of days. You may have a regular period in 2-6 weeks.  Unexpected observations call your doctor if these occur: persistent or heavy bleeding. Severe abdominal cramping or pain. Elevation of temperature greater than 100F.  Call for an appointment in one week.    Patient's Signature_______________________________________________________  Nurse's Signature________________________________________________________   Post Anesthesia Home Care Instructions  Activity: Get plenty of rest for the remainder of the day. A responsible adult should stay with you for 24 hours following the procedure.  For the next 24 hours, DO NOT: -Drive a car -Paediatric nurse -Drink alcoholic beverages -Take any medication unless instructed by your physician -Make any legal decisions or sign important papers.  Meals: Start with liquid foods such as  gelatin or soup. Progress to regular foods as tolerated. Avoid greasy, spicy, heavy foods. If nausea and/or vomiting occur, drink only clear liquids until the nausea and/or vomiting subsides. Call your physician if vomiting continues.  Special Instructions/Symptoms: Your throat may feel dry or sore from the anesthesia or the breathing tube placed in your throat during surgery. If this causes discomfort, gargle with warm salt water. The discomfort should disappear within 24 hours.  If you had a scopolamine patch placed behind your ear for the management of post- operative nausea and/or vomiting:  1. The medication in the patch is effective for 72 hours, after which it should be removed.  Wrap patch in a tissue and discard in the trash. Wash hands thoroughly with soap and water. 2. You may remove the patch earlier than 72 hours if you experience unpleasant side effects which may include dry mouth, dizziness or visual disturbances. 3. Avoid touching the patch. Wash your hands with soap and water after contact with the patch.

## 2016-08-06 ENCOUNTER — Encounter (HOSPITAL_BASED_OUTPATIENT_CLINIC_OR_DEPARTMENT_OTHER): Payer: Self-pay | Admitting: Obstetrics & Gynecology

## 2016-08-06 NOTE — Anesthesia Postprocedure Evaluation (Signed)
Anesthesia Post Note  Patient: Rachael Russell  Procedure(s) Performed: Procedure(s) (LRB): DILATATION & CURETTAGE/HYSTEROSCOPY WITH NOVASURE ABLATION (N/A)  Patient location during evaluation: PACU Anesthesia Type: General Level of consciousness: awake and alert and oriented Pain management: pain level controlled Vital Signs Assessment: post-procedure vital signs reviewed and stable Respiratory status: spontaneous breathing, nonlabored ventilation and respiratory function stable Cardiovascular status: blood pressure returned to baseline and stable Postop Assessment: no signs of nausea or vomiting Anesthetic complications: no    Last Vitals:  Vitals:   08/05/16 1430 08/05/16 1531  BP: (!) 135/94 (!) 150/96  Pulse: 77 78  Resp: 19 16  Temp:  36.8 C    Last Pain:  Vitals:   08/05/16 1531  TempSrc: Tympanic  PainSc:                  [name]

## 2016-08-11 ENCOUNTER — Ambulatory Visit (INDEPENDENT_AMBULATORY_CARE_PROVIDER_SITE_OTHER): Payer: 59 | Admitting: Gastroenterology

## 2016-08-11 ENCOUNTER — Encounter: Payer: Self-pay | Admitting: Gastroenterology

## 2016-08-11 VITALS — BP 134/98 | HR 76 | Ht 65.25 in | Wt 186.2 lb

## 2016-08-11 DIAGNOSIS — K219 Gastro-esophageal reflux disease without esophagitis: Secondary | ICD-10-CM

## 2016-08-11 DIAGNOSIS — Z1211 Encounter for screening for malignant neoplasm of colon: Secondary | ICD-10-CM

## 2016-08-11 DIAGNOSIS — R12 Heartburn: Secondary | ICD-10-CM | POA: Diagnosis not present

## 2016-08-11 MED ORDER — PANTOPRAZOLE SODIUM 40 MG PO TBEC: 40.0000 mg | DELAYED_RELEASE_TABLET | Freq: Every day | ORAL | 12 refills | Status: DC

## 2016-08-11 MED ORDER — NA SULFATE-K SULFATE-MG SULF 17.5-3.13-1.6 GM/177ML PO SOLN: 1.0000 | Freq: Once | ORAL | 0 refills | Status: AC

## 2016-08-11 NOTE — Patient Instructions (Signed)
You have been scheduled for a colonoscopy. Please follow written instructions given to you at your visit today.  Please pick up your prep supplies at the pharmacy within the next 1-3 days. If you use inhalers (even only as needed), please bring them with you on the day of your procedure. Your physician has requested that you go to www.startemmi.com and enter the access code given to you at your visit today. This web site gives a general overview about your procedure. However, you should still follow specific instructions given to you by our office regarding your preparation for the procedure.   Gastroesophageal Reflux Disease, Adult Normally, food travels down the esophagus and stays in the stomach to be digested. However, when a person has gastroesophageal reflux disease (GERD), food and stomach acid move back up into the esophagus. When this happens, the esophagus becomes sore and inflamed. Over time, GERD can create small holes (ulcers) in the lining of the esophagus. What are the causes? This condition is caused by a problem with the muscle between the esophagus and the stomach (lower esophageal sphincter, or LES). Normally, the LES muscle closes after food passes through the esophagus to the stomach. When the LES is weakened or abnormal, it does not close properly, and that allows food and stomach acid to go back up into the esophagus. The LES can be weakened by certain dietary substances, medicines, and medical conditions, including:  Tobacco use.  Pregnancy.  Having a hiatal hernia.  Heavy alcohol use.  Certain foods and beverages, such as coffee, chocolate, onions, and peppermint. What increases the risk? This condition is more likely to develop in:  People who have an increased body weight.  People who have connective tissue disorders.  People who use NSAID medicines. What are the signs or symptoms? Symptoms of this condition include:  Heartburn.  Difficult or painful  swallowing.  The feeling of having a lump in the throat.  Abitter taste in the mouth.  Bad breath.  Having a large amount of saliva.  Having an upset or bloated stomach.  Belching.  Chest pain.  Shortness of breath or wheezing.  Ongoing (chronic) cough or a night-time cough.  Wearing away of tooth enamel.  Weight loss. Different conditions can cause chest pain. Make sure to see your health care provider if you experience chest pain. How is this diagnosed? Your health care provider will take a medical history and perform a physical exam. To determine if you have mild or severe GERD, your health care provider may also monitor how you respond to treatment. You may also have other tests, including:  An endoscopy toexamine your stomach and esophagus with a small camera.  A test thatmeasures the acidity level in your esophagus.  A test thatmeasures how much pressure is on your esophagus.  A barium swallow or modified barium swallow to show the shape, size, and functioning of your esophagus. How is this treated? The goal of treatment is to help relieve your symptoms and to prevent complications. Treatment for this condition may vary depending on how severe your symptoms are. Your health care provider may recommend:  Changes to your diet.  Medicine.  Surgery. Follow these instructions at home: Diet  Follow a diet as recommended by your health care provider. This may involve avoiding foods and drinks such as:  Coffee and tea (with or without caffeine).  Drinks that containalcohol.  Energy drinks and sports drinks.  Carbonated drinks or sodas.  Chocolate and cocoa.  Peppermint and  mint flavorings.  Garlic and onions.  Horseradish.  Spicy and acidic foods, including peppers, chili powder, curry powder, vinegar, hot sauces, and barbecue sauce.  Citrus fruit juices and citrus fruits, such as oranges, lemons, and limes.  Tomato-based foods, such as red sauce,  chili, salsa, and pizza with red sauce.  Fried and fatty foods, such as donuts, french fries, potato chips, and high-fat dressings.  High-fat meats, such as hot dogs and fatty cuts of red and white meats, such as rib eye steak, sausage, ham, and bacon.  High-fat dairy items, such as whole milk, butter, and cream cheese.  Eat small, frequent meals instead of large meals.  Avoid drinking large amounts of liquid with your meals.  Avoid eating meals during the 2-3 hours before bedtime.  Avoid lying down right after you eat.  Do not exercise right after you eat. General instructions  Pay attention to any changes in your symptoms.  Take over-the-counter and prescription medicines only as told by your health care provider. Do not take aspirin, ibuprofen, or other NSAIDs unless your health care provider told you to do so.  Do not use any tobacco products, including cigarettes, chewing tobacco, and e-cigarettes. If you need help quitting, ask your health care provider.  Wear loose-fitting clothing. Do not wear anything tight around your waist that causes pressure on your abdomen.  Raise (elevate) the head of your bed 6 inches (15cm).  Try to reduce your stress, such as with yoga or meditation. If you need help reducing stress, ask your health care provider.  If you are overweight, reduce your weight to an amount that is healthy for you. Ask your health care provider for guidance about a safe weight loss goal.  Keep all follow-up visits as told by your health care provider. This is important. Contact a health care provider if:  You have new symptoms.  You have unexplained weight loss.  You have difficulty swallowing, or it hurts to swallow.  You have wheezing or a persistent cough.  Your symptoms do not improve with treatment.  You have a hoarse voice. Get help right away if:  You have pain in your arms, neck, jaw, teeth, or back.  You feel sweaty, dizzy, or  light-headed.  You have chest pain or shortness of breath.  You vomit and your vomit looks like blood or coffee grounds.  You faint.  Your stool is bloody or black.  You cannot swallow, drink, or eat. This information is not intended to replace advice given to you by your health care provider. Make sure you discuss any questions you have with your health care provider. Document Released: 05/20/2005 Document Revised: 01/08/2016 Document Reviewed: 12/05/2014 Elsevier Interactive Patient Education  2017 [employer].

## 2016-08-11 NOTE — Progress Notes (Signed)
Rachael Russell    YC:[id number]    [birth year]  Primary Care Physician:Marne Tower, MD  Referring Physician: Abner Greenspan, MD [place], [place] [address]  Chief complaint:  GERD  HPI:  50 yr F here for new patient evaluation with c/o persistent heartburn and reflux symptoms. She has been having reflux symptoms for the past 2 years with severe heartburn and occasionally wakes up at night with choking sensation and chest discomfort . she was started on ranitidine by Dr.Tower about a year ago and was increased to twice daily 3 months ago with no significant improvement. She has heavy menses and severe anemia due to menorrhagia , was started on oral iron therapy and has been noticed having dark tarry stools since she started taking iron , more dark green to black then maroon stools . Denies any bright red blood per rectum, constipation or diarrhea. No odynophagia, dysphagia or weight loss She never had a EGD or colonoscopy. No family history of colon cancer    Outpatient Encounter Prescriptions as of 08/11/2016  Medication Sig  . Cholecalciferol 2000 units TABS Take 1 tablet by mouth daily.  . ferrous sulfate 325 (65 FE) MG tablet Take 325 mg by mouth daily with breakfast.  . lisinopril (PRINIVIL,ZESTRIL) 40 MG tablet Take 1 tablet (40 mg total) by mouth daily.  . ranitidine (ZANTAC) 150 MG tablet Take 1 tablet (150 mg total) by mouth 2 (two) times daily.  Marland Kitchen torsemide (DEMADEX) 20 MG tablet Take 1 tablet (20 mg total) by mouth daily.  . [DISCONTINUED] norethindrone (CAMILA) 0.35 MG tablet Take 1 tablet by mouth daily.  . [DISCONTINUED] oxyCODONE-acetaminophen (PERCOCET) 7.5-325 MG tablet Take 1 tablet by mouth every 4 (four) hours as needed for severe pain.  . [EXPIRED] metroNIDAZOLE (FLAGYL) IVPB 500 mg   . [DISCONTINUED] ceFAZolin (ANCEF) IVPB 2g/100 mL premix   . [DISCONTINUED] gentamicin (GARAMYCIN) 340 mg in dextrose 5 % 100 mL IVPB   . [DISCONTINUED]  gentamicin (GARAMYCIN) 410 mg in dextrose 5 % 100 mL IVPB   . [DISCONTINUED] lactated ringers infusion    No facility-administered encounter medications on file as of 08/11/2016.     Allergies as of 08/11/2016 - Review Complete 08/11/2016  Allergen Reaction Noted  . Ferrous sulfate  05/11/2011  . Penicillins Other (See Comments) 04/22/2007  . Prilosec [omeprazole magnesium]  05/11/2011  . Chlorhexidine gluconate Rash 08/05/2016    Past Medical History:  Diagnosis Date  . Anemia   . Family history of adverse reaction to anesthesia    mother has difficult intubation  . Fibroadenoma of breast, left   . Fibrocystic breast, right   . GERD (gastroesophageal reflux disease)   . Hyperlipidemia   . Hypertension   . Irritable bowel syndrome (IBS)   . Menorrhagia   . Wears glasses     Past Surgical History:  Procedure Laterality Date  . BREAST DUCTAL SYSTEM EXCISION Left 11/18/2012   Procedure: EXCISION DUCTAL SYSTEM BREAST;  Surgeon: [name], MD;  Location: [facility];  Service: General;  Laterality: Left;  . BREAST LUMPECTOMY WITH NEEDLE LOCALIZATION Left 11/18/2012   Procedure: excise ductal system left breast. subarealar. left partial mastectomy with radiographic guidance;  Surgeon: [name], MD;  Location: [facility];  Service: General;  Laterality: Left;  excise ductal system left breast. subarealar. left partial mastectomy with radiographic guidance  . BREAST LUMPECTOMY WITH NEEDLE LOCALIZATION  Left 03/27/2015   Procedure: LEFT BREAST LUMPECTOMY  WITH NEEDLE LOCALIZATION TIMES TWO;  Surgeon: Fanny Skates, MD;  Location: [facility];  Service: General;  Laterality: Left;  . CESAREAN SECTION  [id number]  . DILATION AND CURETTAGE OF UTERUS  1998  . DILITATION & CURRETTAGE/HYSTROSCOPY WITH NOVASURE ABLATION N/A 08/05/2016   Procedure: DILATATION & CURETTAGE/HYSTEROSCOPY WITH NOVASURE ABLATION;  Surgeon: Princess Bruins, MD;  Location: [facility];  Service: Gynecology;  Laterality: N/A;  . TONSILLECTOMY  age 29  . UMBILICAL HERNIA REPAIR  2006    Family History  Problem Relation Age of Onset  . Hypertension Mother   . Arthritis Mother     osteoarthritis  . Breast cancer Mother   . Hypertension Father   . Arthritis Father     osteoarthritis  . Breast cancer Maternal Grandmother   . Breast cancer Paternal Aunt     Social History   Social History  . Marital status: Married    Spouse name: N/A  . Number of children: 2  . Years of education: N/A   Occupational History  . 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 Smoking status: Never Smoker  . Smokeless tobacco: Never Used  . Alcohol use Yes     Comment: occasional  . Drug use: No  . Sexual activity: Not on file     Comment: vasectomy   Other Topics Concern  . Not on file   Social History Narrative  . No narrative on file      Review of systems: Review of Systems  Constitutional: Negative for fever and chills.  HENT: Negative.   Eyes: Negative for blurred vision.  Respiratory: Negative for cough, shortness of breath and wheezing.   Cardiovascular: Negative for chest pain and palpitations.  Gastrointestinal: as per HPI Genitourinary: Negative for dysuria, urgency, frequency and hematuria.  Musculoskeletal: Negative for myalgias, back pain and joint pain.  Skin: Negative for itching and rash.  Neurological: Negative for dizziness, tremors, focal weakness, seizures and loss of consciousness.  Endo/Heme/Allergies: Negative for seasonal allergies. Anemia, heavy menses Psychiatric/Behavioral: Negative for depression, suicidal ideas and hallucinations.  All other systems reviewed and are negative.   Physical Exam: Vitals:   08/11/16 0909  BP: (!) 134/98  Pulse: 76   Body mass index is 30.76 kg/m. Gen:      No acute distress HEENT:  EOMI, sclera anicteric Neck:     No masses; no  thyromegaly Lungs:    Clear to auscultation bilaterally; normal respiratory effort CV:         Regular rate and rhythm; no murmurs Abd:      + bowel sounds; soft, non-tender; no palpable masses, no distension Ext:    No edema; adequate peripheral perfusion Skin:      Warm and dry; no rash Neuro: alert and oriented x 3 Psych: normal mood and affect  Data Reviewed:  Reviewed labs, radiology imaging, old records and pertinent past GI work up   Assessment and Plan/Recommendations: 50 year old female with history of worsening GERD symptoms for the past 2 years here for evaluation Patient continues to have persistent symptoms despite H2 blocker We will start PPI, Protonix 40 mg, 30 minutes before breakfast Discussed antireflux measures in detail with the patient We'll reassess in 2-3 months, if continues to have persistent symptoms despite PPI therapy will consider EGD for further evaluation Patient is due for colonoscopy for colorectal cancer screening, we'll  schedule it The risks and benefits as well as alternatives of endoscopic procedure(s) have been discussed and reviewed. All questions answered. The patient agrees to proceed.  Greater than 50% of the time used for counseling as well as treatment plan and follow-up. She had multiple questions which were answered to her satisfaction  K. Denzil Magnuson , MD [phone] Mon-Fri 8a-5p [phone] after 5p, weekends, holidays  CC: Tower, Wynelle Fanny, MD

## 2016-09-10 ENCOUNTER — Encounter: Payer: Self-pay | Admitting: Gastroenterology

## 2016-09-22 ENCOUNTER — Telehealth: Payer: Self-pay | Admitting: Gastroenterology

## 2016-09-22 NOTE — Telephone Encounter (Signed)
Ok thanks 

## 2016-09-22 NOTE — Telephone Encounter (Signed)
Patient stated that she has her prep instructions and that she has ate salad Sat Sun and Monday  Wants to make sure its ok to continue woth her colonoscopy, I explained top her that it is okay that she still have her colonoscopy but that from this day forward she can not eat any more salads or raw vegetables.  And to stay on clear liquikds all day tomorrow  Dr Darleen Crocker

## 2016-09-24 ENCOUNTER — Encounter: Payer: Self-pay | Admitting: Gastroenterology

## 2016-09-24 ENCOUNTER — Ambulatory Visit (AMBULATORY_SURGERY_CENTER): Payer: 59 | Admitting: Gastroenterology

## 2016-09-24 VITALS — BP 126/76 | HR 68 | Temp 98.2°F | Resp 15 | Ht 62.0 in | Wt 186.0 lb

## 2016-09-24 DIAGNOSIS — Z1212 Encounter for screening for malignant neoplasm of rectum: Secondary | ICD-10-CM

## 2016-09-24 DIAGNOSIS — Z1211 Encounter for screening for malignant neoplasm of colon: Secondary | ICD-10-CM

## 2016-09-24 MED ORDER — SODIUM CHLORIDE 0.9 % IV SOLN: 500.0000 mL | INTRAVENOUS | Status: DC

## 2016-09-24 NOTE — Progress Notes (Signed)
Report to PACU, RN, vss, BBS= Clear.  

## 2016-09-24 NOTE — Patient Instructions (Signed)
YOU HAD AN ENDOSCOPIC PROCEDURE TODAY AT [facility]:   Refer to the procedure report that was given to you for any specific questions about what was found during the examination.  If the procedure report does not answer your questions, please call your gastroenterologist to clarify.  If you requested that your care partner not be given the details of your procedure findings, then the procedure report has been included in a sealed envelope for you to review at your convenience later.  YOU SHOULD EXPECT: Some feelings of bloating in the abdomen. Passage of more gas than usual.  Walking can help get rid of the air that was put into your GI tract during the procedure and reduce the bloating. If you had a lower endoscopy (such as a colonoscopy or flexible sigmoidoscopy) you may notice spotting of blood in your stool or on the toilet paper. If you underwent a bowel prep for your procedure, you may not have a normal bowel movement for a few days.  Please Note:  You might notice some irritation and congestion in your nose or some drainage.  This is from the oxygen used during your procedure.  There is no need for concern and it should clear up in a day or so.  SYMPTOMS TO REPORT IMMEDIATELY:   Following lower endoscopy (colonoscopy or flexible sigmoidoscopy):  Excessive amounts of blood in the stool  Significant tenderness or worsening of abdominal pains  Swelling of the abdomen that is new, acute  Fever of 100F or higher   Following upper endoscopy (EGD)  Vomiting of blood or coffee ground material  New chest pain or pain under the shoulder blades  Painful or persistently difficult swallowing  New shortness of breath  Fever of 100F or higher  Black, tarry-looking stools  For urgent or emergent issues, a gastroenterologist can be reached at any hour by calling [phone].   DIET:  We do recommend a small meal at first, but then you may proceed to your regular diet.  Drink  plenty of fluids but you should avoid alcoholic beverages for 24 hours.  ACTIVITY:  You should plan to take it easy for the rest of today and you should NOT DRIVE or use heavy machinery until tomorrow (because of the sedation medicines used during the test).    FOLLOW UP: Our staff will call the number listed on your records the next business day following your procedure to check on you and address any questions or concerns that you may have regarding the information given to you following your procedure. If we do not reach you, we will leave a message.  However, if you are feeling well and you are not experiencing any problems, there is no need to return our call.  We will assume that you have returned to your regular daily activities without incident.  If any biopsies were taken you will be contacted by phone or by letter within the next 1-3 weeks.  Please call us at [phone] if you have not heard about the biopsies in 3 weeks.    SIGNATURES/CONFIDENTIALITY: You and/or your care partner have signed paperwork which will be entered into your electronic medical record.  These signatures attest to the fact that that the information above on your After Visit Summary has been reviewed and is understood.  Full responsibility of the confidentiality of this discharge information lies with you and/or your care-partner.   INFORMATION ON DIVERTICULOSIS AND HEMORRHOIDS GIVEN TO YOU TODAY

## 2016-09-24 NOTE — Op Note (Signed)
[facility] Patient Name: Rachael Russell Procedure Date: 09/24/2016 11:15 AM MRN: [id number] Endoscopist: Mauri Pole , MD Age: 51 Referring MD:  Date of Birth: 1966/08/20 Gender: Female Account #: [id number] Procedure:                Colonoscopy Indications:              Screening for colorectal malignant neoplasm, This                            is the patient's first colonoscopy Medicines:                Monitored Anesthesia Care Procedure:                Pre-Anesthesia Assessment:                           - Prior to the procedure, a History and Physical                            was performed, and patient medications and                            allergies were reviewed. The patient's tolerance of                            previous anesthesia was also reviewed. The risks                            and benefits of the procedure and the sedation                            options and risks were discussed with the patient.                            All questions were answered, and informed consent                            was obtained. Prior Anticoagulants: The patient has                            taken no previous anticoagulant or antiplatelet                            agents. ASA Grade Assessment: II - A patient with                            mild systemic disease. After reviewing the risks                            and benefits, the patient was deemed in                            satisfactory condition to undergo the procedure.  After obtaining informed consent, the colonoscope                            was passed under direct vision. Throughout the                            procedure, the patient's blood pressure, pulse, and                            oxygen saturations were monitored continuously. The                            Model CF-HQ190L [id number]) scope was introduced                            through the anus and  advanced to the the cecum,                            identified by appendiceal orifice and ileocecal                            valve. The colonoscopy was performed without                            difficulty. The patient tolerated the procedure                            well. The quality of the bowel preparation was                            adequate. The ileocecal valve, appendiceal orifice,                            and rectum were photographed. Scope In: 11:17:59 AM Scope Out: 11:38:32 AM Scope Withdrawal Time: 0 hours 16 minutes 22 seconds  Total Procedure Duration: 0 hours 20 minutes 33 seconds  Findings:                 The perianal and digital rectal examinations were                            normal.                           A few small-mouthed diverticula were found in the                            sigmoid colon and descending colon.                           Non-bleeding internal hemorrhoids were found during                            retroflexion. The hemorrhoids were small.  The exam was otherwise without abnormality. Complications:            No immediate complications. Estimated Blood Loss:     Estimated blood loss: none. Impression:               - Diverticulosis in the sigmoid colon and in the                            descending colon.                           - Non-bleeding internal hemorrhoids.                           - The examination was otherwise normal.                           - No specimens collected. Recommendation:           - Patient has a contact number available for                            emergencies. The signs and symptoms of potential                            delayed complications were discussed with the                            patient. Return to normal activities tomorrow.                            Written discharge instructions were provided to the                            patient.                            - Resume previous diet.                           - Continue present medications.                           - Repeat colonoscopy in 10 years for screening                            purposes. Mauri Pole, MD 09/24/2016 11:44:52 AM This report has been signed electronically.

## 2016-09-25 ENCOUNTER — Telehealth: Payer: Self-pay

## 2016-09-25 NOTE — Telephone Encounter (Signed)
  Follow up Call-  Call back number 09/24/2016  Post procedure Call Back phone  # [phone]  Permission to leave phone message Yes  Some recent data might be hidden     Patient questions:  Do you have a fever, pain , or abdominal swelling? No. Pain Score  0 *  Have you tolerated food without any problems? Yes.    Have you been able to return to your normal activities? Yes.    Do you have any questions about your discharge instructions: Diet   No. Medications  No. Follow up visit  No.  Do you have questions or concerns about your Care? No.  Actions: * If pain score is 4 or above: No action needed, pain <4.

## 2016-09-28 ENCOUNTER — Other Ambulatory Visit: Payer: Self-pay

## 2016-09-28 DIAGNOSIS — I10 Essential (primary) hypertension: Secondary | ICD-10-CM

## 2016-09-28 MED ORDER — LISINOPRIL 40 MG PO TABS: 40.0000 mg | ORAL_TABLET | Freq: Every day | ORAL | 0 refills | Status: DC

## 2016-09-28 NOTE — Telephone Encounter (Signed)
Pt was late on ordering lisinopril from [facility] and request # 15 to [facility] [place]. Advised pt done.

## 2017-02-23 ENCOUNTER — Telehealth: Payer: Self-pay

## 2017-02-23 DIAGNOSIS — R600 Localized edema: Secondary | ICD-10-CM

## 2017-02-23 DIAGNOSIS — E7800 Pure hypercholesterolemia, unspecified: Secondary | ICD-10-CM

## 2017-02-23 NOTE — Telephone Encounter (Signed)
Pt left v/m requesting lab testing for statin levels and kidney function. Pt having swelling and wants to make sure statins have not affected kidney function. Pt last annual 04/28/16. Last BMP 08/05/16 and last lipid 04/23/16.Please advise. Pt has cpx labs on 04/28/17 and CPX scheduled on 04/30/17.

## 2017-02-23 NOTE — Telephone Encounter (Signed)
We do not do statin levels but can check liver and kidney function  I will put in future lab order for cmet and also lipids  Alert me if symptoms worsen or change Thanks

## 2017-02-23 NOTE — Telephone Encounter (Signed)
Pt notified of Dr. Tower's comments and lab appt scheduled  

## 2017-02-26 ENCOUNTER — Other Ambulatory Visit (INDEPENDENT_AMBULATORY_CARE_PROVIDER_SITE_OTHER): Payer: 59

## 2017-02-26 DIAGNOSIS — E7800 Pure hypercholesterolemia, unspecified: Secondary | ICD-10-CM

## 2017-02-26 DIAGNOSIS — R600 Localized edema: Secondary | ICD-10-CM

## 2017-02-26 NOTE — Addendum Note (Signed)
Addended by: Ellamae Sia on: 02/26/2017 09:43 AM   Modules accepted: Orders

## 2017-02-27 LAB — COMPREHENSIVE METABOLIC PANEL
ALT: 14 IU/L (ref 0–32)
AST: 17 IU/L (ref 0–40)
Albumin/Globulin Ratio: 1.7 (ref 1.2–2.2)
Albumin: 4.3 g/dL (ref 3.5–5.5)
Alkaline Phosphatase: 69 IU/L (ref 39–117)
BUN/Creatinine Ratio: 15 (ref 9–23)
BUN: 15 mg/dL (ref 6–24)
Bilirubin Total: 0.6 mg/dL (ref 0.0–1.2)
CO2: 24 mmol/L (ref 20–29)
Calcium: 9.4 mg/dL (ref 8.7–10.2)
Chloride: 102 mmol/L (ref 96–106)
Creatinine, Ser: 1.01 mg/dL — ABNORMAL HIGH (ref 0.57–1.00)
GFR calc Af Amer: 74 mL/min/{1.73_m2} (ref >59)
GFR calc non Af Amer: 65 mL/min/{1.73_m2} (ref >59)
Globulin, Total: 2.5 g/dL (ref 1.5–4.5)
Glucose: 86 mg/dL (ref 65–99)
Potassium: 3.9 mmol/L (ref 3.5–5.2)
Sodium: 142 mmol/L (ref 134–144)
Total Protein: 6.8 g/dL (ref 6.0–8.5)

## 2017-02-27 LAB — LIPID PANEL
Chol/HDL Ratio: 3.5 ratio (ref 0.0–4.4)
Cholesterol, Total: 254 mg/dL — ABNORMAL HIGH (ref 100–199)
HDL: 72 mg/dL (ref >39)
LDL Calculated: 166 mg/dL — ABNORMAL HIGH (ref 0–99)
Triglycerides: 79 mg/dL (ref 0–149)
VLDL Cholesterol Cal: 16 mg/dL (ref 5–40)

## 2017-03-08 ENCOUNTER — Ambulatory Visit: Payer: 59 | Admitting: Family Medicine

## 2017-04-08 ENCOUNTER — Other Ambulatory Visit: Payer: Self-pay | Admitting: Family Medicine

## 2017-04-08 DIAGNOSIS — I10 Essential (primary) hypertension: Secondary | ICD-10-CM

## 2017-04-14 ENCOUNTER — Encounter: Payer: Self-pay | Admitting: Family Medicine

## 2017-04-21 ENCOUNTER — Other Ambulatory Visit: Payer: Self-pay | Admitting: Radiology

## 2017-04-21 ENCOUNTER — Encounter: Payer: Self-pay | Admitting: Family Medicine

## 2017-04-22 ENCOUNTER — Telehealth: Payer: Self-pay | Admitting: Family Medicine

## 2017-04-22 DIAGNOSIS — D509 Iron deficiency anemia, unspecified: Secondary | ICD-10-CM

## 2017-04-22 DIAGNOSIS — E7800 Pure hypercholesterolemia, unspecified: Secondary | ICD-10-CM

## 2017-04-22 DIAGNOSIS — E559 Vitamin D deficiency, unspecified: Secondary | ICD-10-CM

## 2017-04-22 DIAGNOSIS — I10 Essential (primary) hypertension: Secondary | ICD-10-CM

## 2017-04-22 NOTE — Telephone Encounter (Signed)
-----   Message from Ellamae Sia sent at 04/21/2017 11:25 AM EDT ----- Regarding: Lab orders for Wednesday, 9.5.18 Patient is scheduled for CPX labs, please order future labs, Thanks , Karna Christmas

## 2017-04-27 ENCOUNTER — Telehealth: Payer: Self-pay | Admitting: Family Medicine

## 2017-04-27 NOTE — Telephone Encounter (Signed)
Best number [phone] Pt has cpx labs appointment 9/4 she stated she had labs done 02/26/17 and wanted to know if she still needed to come in tomorrow

## 2017-04-27 NOTE — Telephone Encounter (Signed)
I see that she had labs - they did not do a blood count or thyroid so I would prefer she did  However if she would rather have blood drawn after I see her (the day of her physical) I am also ok with that

## 2017-04-27 NOTE — Telephone Encounter (Signed)
Pt said she will keep her appt tomorrow

## 2017-04-27 NOTE — Telephone Encounter (Signed)
Would you please take the lipid order out of tomorrow's labs?  She had it done last month Thanks

## 2017-04-28 ENCOUNTER — Other Ambulatory Visit (INDEPENDENT_AMBULATORY_CARE_PROVIDER_SITE_OTHER): Payer: 59

## 2017-04-28 DIAGNOSIS — I10 Essential (primary) hypertension: Secondary | ICD-10-CM | POA: Diagnosis not present

## 2017-04-28 DIAGNOSIS — D509 Iron deficiency anemia, unspecified: Secondary | ICD-10-CM

## 2017-04-28 DIAGNOSIS — E559 Vitamin D deficiency, unspecified: Secondary | ICD-10-CM

## 2017-04-28 NOTE — Addendum Note (Signed)
Addended by: Mady Haagensen on: 04/28/2017 09:45 AM   Modules accepted: Orders

## 2017-04-29 LAB — COMPREHENSIVE METABOLIC PANEL
ALT: 9 IU/L (ref 0–32)
AST: 16 IU/L (ref 0–40)
Albumin/Globulin Ratio: 1.7 (ref 1.2–2.2)
Albumin: 4.3 g/dL (ref 3.5–5.5)
Alkaline Phosphatase: 64 IU/L (ref 39–117)
BUN/Creatinine Ratio: 14 (ref 9–23)
BUN: 14 mg/dL (ref 6–24)
Bilirubin Total: 0.3 mg/dL (ref 0.0–1.2)
CO2: 21 mmol/L (ref 20–29)
Calcium: 9.3 mg/dL (ref 8.7–10.2)
Chloride: 104 mmol/L (ref 96–106)
Creatinine, Ser: 0.98 mg/dL (ref 0.57–1.00)
GFR calc Af Amer: 77 mL/min/{1.73_m2} (ref >59)
GFR calc non Af Amer: 67 mL/min/{1.73_m2} (ref >59)
Globulin, Total: 2.6 g/dL (ref 1.5–4.5)
Glucose: 91 mg/dL (ref 65–99)
Potassium: 4 mmol/L (ref 3.5–5.2)
Sodium: 139 mmol/L (ref 134–144)
Total Protein: 6.9 g/dL (ref 6.0–8.5)

## 2017-04-29 LAB — CBC WITH DIFFERENTIAL/PLATELET
Basophils Absolute: 0 10*3/uL (ref 0.0–0.2)
Basos: 0 %
EOS (ABSOLUTE): 0.1 10*3/uL (ref 0.0–0.4)
Eos: 2 %
Hematocrit: 37.4 % (ref 34.0–46.6)
Hemoglobin: 12.6 g/dL (ref 11.1–15.9)
Immature Grans (Abs): 0 10*3/uL (ref 0.0–0.1)
Immature Granulocytes: 0 %
Lymphocytes Absolute: 1.8 10*3/uL (ref 0.7–3.1)
Lymphs: 24 %
MCH: 30.6 pg (ref 26.6–33.0)
MCHC: 33.7 g/dL (ref 31.5–35.7)
MCV: 91 fL (ref 79–97)
Monocytes Absolute: 0.6 10*3/uL (ref 0.1–0.9)
Monocytes: 8 %
Neutrophils Absolute: 5.2 10*3/uL (ref 1.4–7.0)
Neutrophils: 66 %
Platelets: 288 10*3/uL (ref 150–379)
RBC: 4.12 x10E6/uL (ref 3.77–5.28)
RDW: 12.6 % (ref 12.3–15.4)
WBC: 7.8 10*3/uL (ref 3.4–10.8)

## 2017-04-29 LAB — FERRITIN: Ferritin: 88 ng/mL (ref 15–150)

## 2017-04-29 LAB — VITAMIN D 25 HYDROXY (VIT D DEFICIENCY, FRACTURES): Vit D, 25-Hydroxy: 29.1 ng/mL — ABNORMAL LOW (ref 30.0–100.0)

## 2017-04-29 LAB — TSH: TSH: 1.82 u[IU]/mL (ref 0.450–4.500)

## 2017-04-30 ENCOUNTER — Encounter: Payer: Self-pay | Admitting: Family Medicine

## 2017-04-30 ENCOUNTER — Ambulatory Visit (INDEPENDENT_AMBULATORY_CARE_PROVIDER_SITE_OTHER): Payer: 59 | Admitting: Family Medicine

## 2017-04-30 VITALS — BP 118/82 | HR 72 | Temp 98.2°F | Ht 65.0 in | Wt 180.2 lb

## 2017-04-30 DIAGNOSIS — D509 Iron deficiency anemia, unspecified: Secondary | ICD-10-CM

## 2017-04-30 DIAGNOSIS — E7800 Pure hypercholesterolemia, unspecified: Secondary | ICD-10-CM | POA: Diagnosis not present

## 2017-04-30 DIAGNOSIS — E559 Vitamin D deficiency, unspecified: Secondary | ICD-10-CM

## 2017-04-30 DIAGNOSIS — Z0000 Encounter for general adult medical examination without abnormal findings: Secondary | ICD-10-CM

## 2017-04-30 DIAGNOSIS — I10 Essential (primary) hypertension: Secondary | ICD-10-CM | POA: Diagnosis not present

## 2017-04-30 DIAGNOSIS — N921 Excessive and frequent menstruation with irregular cycle: Secondary | ICD-10-CM

## 2017-04-30 MED ORDER — LISINOPRIL 40 MG PO TABS: 40.0000 mg | ORAL_TABLET | Freq: Every day | ORAL | 3 refills | Status: DC

## 2017-04-30 MED ORDER — TORSEMIDE 20 MG PO TABS: 20.0000 mg | ORAL_TABLET | Freq: Every day | ORAL | 3 refills | Status: DC

## 2017-04-30 NOTE — Progress Notes (Signed)
Subjective:    Patient ID: Rachael Russell, female    DOB: 1966/07/21, 51 y.o.   MRN: [id number]  HPI Here for health maintenance exam and to review chronic medical problems    Doing well overall  Tired of going through mammogram/ breast imaging     Wt Readings from Last 3 Encounters:  04/30/17 180 lb 4 oz (81.8 kg)  09/24/16 186 lb (84.4 kg)  08/11/16 186 lb 4 oz (84.5 kg)   30.00 kg/m  Mammogram = has another papilloma / may have to have surgery (had to re schedule it)  Mother had breast cancer Self breast exam-no lumps/ does not check often due to anxiety about it and frequent checks   Flu shot -declines   Pap 9/16 neg with neg HPV Needs ref to gyn (hers moved) -- she had an ablation  Still having periods/not as heavy    Colonoscopy 2/18 10 year recall   Td 8/10  Vit D level is low at 29.1    bp is stable today  No cp or palpitations or headaches or edema  No side effects to medicines  BP Readings from Last 3 Encounters:  04/30/17 118/82  09/24/16 126/76  08/11/16 (!) 134/98     Hx of anemia on iron  Lab Results  Component Value Date   WBC 7.8 04/28/2017   HGB 12.6 04/28/2017   HCT 37.4 04/28/2017   MCV 91 04/28/2017   PLT 288 04/28/2017    Hyperlipidemia Lab Results  Component Value Date   CHOL 254 (H) 02/26/2017   CHOL 207 (H) 04/23/2016   CHOL 237 (H) 04/19/2015   Lab Results  Component Value Date   HDL 72 02/26/2017   HDL 65 04/23/2016   HDL 71 04/19/2015   Lab Results  Component Value Date   LDLCALC 166 (H) 02/26/2017   LDLCALC 131 (H) 04/23/2016   LDLCALC 152 (H) 04/19/2015   Lab Results  Component Value Date   TRIG 79 02/26/2017   TRIG 55 04/23/2016   TRIG 69 04/19/2015   Lab Results  Component Value Date   CHOLHDL 3.5 02/26/2017   CHOLHDL 3.2 04/23/2016   CHOLHDL 3.3 04/19/2015   Lab Results  Component Value Date   LDLDIRECT 149.9 05/11/2011   LDLDIRECT 123.2 01/03/2007   LDL is up  Diet is good -no red meat    Likes bacon  Eating more shellfish      Review of Systems Review of Systems  Constitutional: Negative for fever, appetite change, fatigue and unexpected weight change.  Eyes: Negative for pain and visual disturbance.  Respiratory: Negative for cough and shortness of breath.   Cardiovascular: Negative for cp or palpitations    Gastrointestinal: Negative for nausea, diarrhea and constipation. pos for gerd when stressed  Pos for gas/bloating  Genitourinary: Negative for urgency and frequency.  Skin: Negative for pallor or rash   Neurological: Negative for weakness, light-headedness, numbness and headaches.  Hematological: Negative for adenopathy. Does not bruise/bleed easily.  Psychiatric/Behavioral: Negative for dysphoric mood. The patient is not nervous/anxious.         Objective:   Physical Exam  Constitutional: She appears well-developed and well-nourished. No distress.  overwt and well app  HENT:  Head: Normocephalic and atraumatic.  Right Ear: External ear normal.  Left Ear: External ear normal.  Mouth/Throat: Oropharynx is clear and moist.  Eyes: Pupils are equal, round, and reactive to light. Conjunctivae and EOM are normal. No scleral icterus.  Neck: Normal  range of motion. Neck supple. No JVD present. Carotid bruit is not present. No thyromegaly present.  Cardiovascular: Normal rate, regular rhythm, normal heart sounds and intact distal pulses.  Exam reveals no gallop.   Pulmonary/Chest: Effort normal and breath sounds normal. No respiratory distress. She has no wheezes. She exhibits no tenderness.  Abdominal: Soft. Bowel sounds are normal. She exhibits no distension, no abdominal bruit and no mass. There is no tenderness.  Pt had episode of esophageal discomfort when discussing stressful event in the office It resolved within several minutes   Genitourinary: No breast swelling, tenderness, discharge or bleeding.  Genitourinary Comments: Breast exam: No mass, nodules,  thickening, tenderness, bulging, retraction, inflamation, nipple discharge or skin changes noted.  No axillary or clavicular LA.    Very dense breast tissue/fibrocystic  Musculoskeletal: Normal range of motion. She exhibits no edema or tenderness.  Lymphadenopathy:    She has no cervical adenopathy.  Neurological: She is alert. She has normal reflexes. No cranial nerve deficit. She exhibits normal muscle tone. Coordination normal.  Skin: Skin is warm and dry. No rash noted. No erythema. No pallor.  Few brown nevi and skin tags  Psychiatric: She has a normal mood and affect.          Assessment & Plan:   Problem List Items Addressed This Visit      Cardiovascular and Mediastinum   Essential hypertension - Primary    bp in fair control at this time  BP Readings from Last 1 Encounters:  04/30/17 118/82   No changes needed Disc lifstyle change with low sodium diet and exercise  Labs rev      Relevant Medications   torsemide (DEMADEX) 20 MG tablet   lisinopril (PRINIVIL,ZESTRIL) 40 MG tablet     Other   Heavy menses    With hx of ablation and improvement Her gyn retired  She needs ref to new one for f/u and annual exam      Relevant Orders   Ambulatory referral to Gynecology   Hyperlipidemia    Disc goals for lipids and reasons to control them Rev labs with pt Rev low sat fat diet in detail LDL is up  Diet handout given  Consider statin if this continues to rise      Relevant Medications   torsemide (DEMADEX) 20 MG tablet   lisinopril (PRINIVIL,ZESTRIL) 40 MG tablet   Iron deficiency anemia    In pt who had heavy menses followed by ablation  In control with better menses and oral iron       Routine general medical examination at a health care facility    Reviewed health habits including diet and exercise and skin cancer prevention Reviewed appropriate screening tests for age  Also reviewed health mt list, fam hx and immunization status , as well as social and  family history   See HPI Labs rev  Ref made to gyn for annual visit F/u for breast surgery/papilloma upcoming  Disc diet for cholesterol/inc LDL       Vitamin D deficiency    Pt will inc her D3 to 2000 iu daily  Continue to follow  Disc imp to bone and overall health

## 2017-04-30 NOTE — Patient Instructions (Addendum)
Try to avoid carbonation  Stir the bubbles out of your ginger ale  Also avoid drinking out of straws   Please take vitamin D3 2000 iu daily - do not miss a dose     For cholesterol    Avoid red meat/ fried foods/ egg yolks/ fatty breakfast meats/ butter, cheese and high fat dairy/ and shellfish   (fish with fins are good and shellfish are bad)

## 2017-05-02 NOTE — Assessment & Plan Note (Signed)
In pt who had heavy menses followed by ablation  In control with better menses and oral iron

## 2017-05-02 NOTE — Assessment & Plan Note (Signed)
Pt will inc her D3 to 2000 iu daily  Continue to follow  Disc imp to bone and overall health

## 2017-05-02 NOTE — Assessment & Plan Note (Signed)
With hx of ablation and improvement Her gyn retired  She needs ref to new one for f/u and annual exam

## 2017-05-02 NOTE — Assessment & Plan Note (Signed)
Reviewed health habits including diet and exercise and skin cancer prevention Reviewed appropriate screening tests for age  Also reviewed health mt list, fam hx and immunization status , as well as social and family history   See HPI Labs rev  Ref made to gyn for annual visit F/u for breast surgery/papilloma upcoming  Disc diet for cholesterol/inc LDL

## 2017-05-02 NOTE — Assessment & Plan Note (Signed)
bp in fair control at this time  BP Readings from Last 1 Encounters:  04/30/17 118/82   No changes needed Disc lifstyle change with low sodium diet and exercise  Labs rev

## 2017-05-02 NOTE — Assessment & Plan Note (Signed)
Disc goals for lipids and reasons to control them Rev labs with pt Rev low sat fat diet in detail LDL is up  Diet handout given  Consider statin if this continues to rise

## 2017-05-25 ENCOUNTER — Encounter: Payer: Self-pay | Admitting: Family Medicine

## 2017-05-25 ENCOUNTER — Ambulatory Visit (INDEPENDENT_AMBULATORY_CARE_PROVIDER_SITE_OTHER): Payer: 59 | Admitting: Family Medicine

## 2017-05-25 VITALS — BP 135/87 | HR 82 | Wt 189.6 lb

## 2017-05-25 DIAGNOSIS — N921 Excessive and frequent menstruation with irregular cycle: Secondary | ICD-10-CM

## 2017-05-25 DIAGNOSIS — Z1151 Encounter for screening for human papillomavirus (HPV): Secondary | ICD-10-CM | POA: Diagnosis not present

## 2017-05-25 DIAGNOSIS — F529 Unspecified sexual dysfunction not due to a substance or known physiological condition: Secondary | ICD-10-CM

## 2017-05-25 DIAGNOSIS — IMO0001 Reserved for inherently not codable concepts without codable children: Secondary | ICD-10-CM

## 2017-05-25 DIAGNOSIS — Z124 Encounter for screening for malignant neoplasm of cervix: Secondary | ICD-10-CM | POA: Diagnosis not present

## 2017-05-25 NOTE — Patient Instructions (Signed)
Preventive Care 40-64 Years, Female Preventive care refers to lifestyle choices and visits with your health care provider that can promote health and wellness. What does preventive care include?  A yearly physical exam. This is also called an annual well check.  Dental exams once or twice a year.  Routine eye exams. Ask your health care provider how often you should have your eyes checked.  Personal lifestyle choices, including: ? Daily care of your teeth and gums. ? Regular physical activity. ? Eating a healthy diet. ? Avoiding tobacco and drug use. ? Limiting alcohol use. ? Practicing safe sex. ? Taking low-dose aspirin daily starting at age 58. ? Taking vitamin and mineral supplements as recommended by your health care provider. What happens during an annual well check? The services and screenings done by your health care provider during your annual well check will depend on your age, overall health, lifestyle risk factors, and family history of disease. Counseling Your health care provider may ask you questions about your:  Alcohol use.  Tobacco use.  Drug use.  Emotional well-being.  Home and relationship well-being.  Sexual activity.  Eating habits.  Work and work Statistician.  Method of birth control.  Menstrual cycle.  Pregnancy history.  Screening You may have the following tests or measurements:  Height, weight, and BMI.  Blood pressure.  Lipid and cholesterol levels. These may be checked every 5 years, or more frequently if you are over 81 years old.  Skin check.  Lung cancer screening. You may have this screening every year starting at age 78 if you have a 30-pack-year history of smoking and currently smoke or have quit within the past 15 years.  Fecal occult blood test (FOBT) of the stool. You may have this test every year starting at age 65.  Flexible sigmoidoscopy or colonoscopy. You may have a sigmoidoscopy every 5 years or a colonoscopy  every 10 years starting at age 30.  Hepatitis C blood test.  Hepatitis B blood test.  Sexually transmitted disease (STD) testing.  Diabetes screening. This is done by checking your blood sugar (glucose) after you have not eaten for a while (fasting). You may have this done every 1-3 years.  Mammogram. This may be done every 1-2 years. Talk to your health care provider about when you should start having regular mammograms. This may depend on whether you have a family history of breast cancer.  BRCA-related cancer screening. This may be done if you have a family history of breast, ovarian, tubal, or peritoneal cancers.  Pelvic exam and Pap test. This may be done every 3 years starting at age 80. Starting at age 36, this may be done every 5 years if you have a Pap test in combination with an HPV test.  Bone density scan. This is done to screen for osteoporosis. You may have this scan if you are at high risk for osteoporosis.  Discuss your test results, treatment options, and if necessary, the need for more tests with your health care provider. Vaccines Your health care provider may recommend certain vaccines, such as:  Influenza vaccine. This is recommended every year.  Tetanus, diphtheria, and acellular pertussis (Tdap, Td) vaccine. You may need a Td booster every 10 years.  Varicella vaccine. You may need this if you have not been vaccinated.  Zoster vaccine. You may need this after age 5.  Measles, mumps, and rubella (MMR) vaccine. You may need at least one dose of MMR if you were born in  1957 or later. You may also need a second dose.  Pneumococcal 13-valent conjugate (PCV13) vaccine. You may need this if you have certain conditions and were not previously vaccinated.  Pneumococcal polysaccharide (PPSV23) vaccine. You may need one or two doses if you smoke cigarettes or if you have certain conditions.  Meningococcal vaccine. You may need this if you have certain  conditions.  Hepatitis A vaccine. You may need this if you have certain conditions or if you travel or work in places where you may be exposed to hepatitis A.  Hepatitis B vaccine. You may need this if you have certain conditions or if you travel or work in places where you may be exposed to hepatitis B.  Haemophilus influenzae type b (Hib) vaccine. You may need this if you have certain conditions.  Talk to your health care provider about which screenings and vaccines you need and how often you need them. This information is not intended to replace advice given to you by your health care provider. Make sure you discuss any questions you have with your health care provider. Document Released: 09/06/2015 Document Revised: 04/29/2016 Document Reviewed: 06/11/2015 Elsevier Interactive Patient Education  2017 [employer].

## 2017-05-25 NOTE — Progress Notes (Signed)
LAST PAP 2016 Mammogram 03/2017 Last menses 04/27/2017

## 2017-05-25 NOTE — Progress Notes (Signed)
    Subjective:    Patient ID: Rachael Russell is a 51 y.o. female presenting with New Patient (Initial Visit)  on 05/25/2017  HPI: Reports menorrhagia since age 32. Began Oc's and cycles were better. On OC's until age 62. Cycles were bad. She had heavy cycles and bad cramping. Saw Dr. Dellis Filbert and underwent polyp removal, fibroid removal with Myosure and Novasure in 12/17. Still having cycles but not as heavy and not as crampy. She spotted for 4 days straight in September.  Cycles are getting closer together. She reports diminished sexual desire, stimulation and satisfaction during encounters with her husband. PCP handles most preventive care.  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  PMH, PSH, Meds, Allergies, FH, SH, Problem lest reviewed  Review of Systems  Constitutional: Negative for chills and fever.  Respiratory: Negative for shortness of breath.   Cardiovascular: Negative for chest pain.  Gastrointestinal: Negative for abdominal pain, nausea and vomiting.  Genitourinary: Negative for dysuria.  Skin: Negative for rash.      Objective:    BP 135/87   Pulse 82   Wt 189 lb 9.6 oz (86 kg)   BMI 31.55 kg/m  Physical Exam  Constitutional: She is oriented to person, place, and time. She appears well-developed and well-nourished. No distress.  HENT:  Head: Normocephalic and atraumatic.  Eyes: Pupils are equal, round, and reactive to light. No scleral icterus.  Neck: Normal range of motion. Neck supple. No thyromegaly present.  Cardiovascular: Normal rate, regular rhythm and intact distal pulses.   Pulmonary/Chest: Effort normal and breath sounds normal. Right breast exhibits no inverted nipple, no mass and no nipple discharge. Left breast exhibits no inverted nipple, no mass and no nipple discharge.  Abdominal: Soft. She exhibits no distension. There is no tenderness.  Genitourinary:  Genitourinary Comments: BUS normal, vagina is pink and rugated, cervix  is nulliparous without lesion, uterus is enlarged at 8 wk size and retroverted, no adnexal mass or tenderness.   Neurological: She is alert and oriented to person, place, and time.  Skin: Skin is warm and dry.  Psychiatric: She has a normal mood and affect.        Assessment & Plan:   Problem List Items Addressed This Visit      Unprioritized   Heavy menses - Primary   Relevant Orders   Follicle stimulating hormone (Completed)    Other Visit Diagnoses    Screening for cervical cancer       Relevant Orders   Pap IG and HPV (high risk) DNA detection   Sexual problems       advised open communication, consideration of different anti-hypertensive for husband, intimate sharing, seeking sex therapist if needed.     Return in about 1 year (around 05/25/2018).   Donnamae Jude 05/25/2017 2:25 PM

## 2017-05-26 LAB — PAP IG AND HPV HIGH-RISK
HPV, high-risk: NEGATIVE
PAP Smear Comment: 0

## 2017-05-26 LAB — FOLLICLE STIMULATING HORMONE: FSH: 5.6 m[IU]/mL

## 2017-05-26 NOTE — Assessment & Plan Note (Signed)
Doing well--will see if she is close to menopause

## 2017-05-27 ENCOUNTER — Other Ambulatory Visit: Payer: Self-pay | Admitting: General Surgery

## 2017-05-27 DIAGNOSIS — N6012 Diffuse cystic mastopathy of left breast: Secondary | ICD-10-CM

## 2017-07-04 NOTE — H&P (Signed)
[place] Location: [facility] Patient #: [id number] DOB: 1966/04/28 Married / Language: English / Race: Black or African American Female        History of Present Illness      The patient is a 51 year old female who presents with a complaint of papillomatosis left breast, recurrent. This is a very pleasant 51 year old female. Her husband is here with her throughout the encounter. She is referred by Dr. Marcelo Baldy at [facility] mammography for evaluation of atypical papillary lesion left breast. Dr. Rica Mote is her PCP.      She has had multiple papillomas of the left breast. In 2014 are removed to papillomas from the left breast. In 2016 removed 2 papillomas without atypia. She was referred to the high-risk clinic but for some reason did not go. She says no one called her. Recent imaging studies at [facility] on April 13, 2017 and biopsies on August 29 reveal a cyst at the 4 o'clock position which was aspirated and the fluid was benign on cytology. They also performed ultrasound guided biopsy of an abnormal duct in the left breast at 3 o'clock position, middle depth. Pathology showed atypical papillary lesion. I told her this is a high-risk finding and if advised excision to define her pathology.      Past history reveals the 2 surgeries in the past removing four papillomas. Hypertension. Hyperlipidemia. Basically very healthy.      Family history significant with mother diagnosed with breast cancer 43s. Maternal grandmother diagnosed with breast cancer in her 33s. Paternal aunt had breast cancer. No family history of ovarian or colon cancer or high risk prostate cancer Socially she is married with 2 sons. 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 Denies tobacco. Drinks alcohol occasionally.      We had a very long conversation. We talked about risk factors including multiple papillomas and family history being significant. I told her that she was  clearly at increased risk for cancer in the future. We talked about different strategies. I told that it was important to define the nature of this lesion histologically and to be sure that she did not have cancer at this time. She then clearly needs to be referred to genetic counseling and to the high-risk clinic at the cancer center. She agrees with this. She doesn't seem particularly interested in more aggressive surgery and I did not encourage that at this point in time. Most important thing is to get the histology of this lesion and to then better define her future risk.        She'll be scheduled for left breast lumpectomy with radioactive seed localization. I discussed the the indications, details, techniques and numerous risk of the surgery. She is well aware of all of this because her previous surgeries. She agrees with this plan.   Allergies Ferrous Sulfate *HEMATOPOIETIC AGENTS*  Penicillamine *ASSORTED CLASSES*  PriLOSEC *ULCER DRUGS*  Allergies Reconciled   Medication History  Norco (5-325MG  Tablet, 1 (one)- 2 (two) Tablet Oral every four hours, as needed, Taken starting 04/08/2015) Active. Lisinopril (40MG  Tablet, Oral) Active. Torsemide (20MG  Tablet, Oral) Active. Vitamin D (Ergocalciferol) (50000UNIT Capsule, Oral) Active. Tri-Sprintec (0.18/0.215/0.25MG -35 MCG Tablet, Oral) Active. Zantac (150MG  Tablet, Oral) Active. Medications Reconciled  Vitals  Weight: 183 lb Height: 65in Body Surface Area: 1.9 m Body Mass Index: 30.45 kg/m  Temp.: 97.66F  Pulse: 77 (Regular)  BP: 130/72 (Sitting, Left Arm, Standard)    Physical Exam  General Mental Status-Alert. General  Appearance-Consistent with stated age. Hydration-Well hydrated. Voice-Normal.  Head and Neck Head-normocephalic, atraumatic with no lesions or palpable masses. Trachea-midline. Thyroid Gland Characteristics - normal size and consistency.  Eye Eyeball -  Bilateral-Extraocular movements intact. Sclera/Conjunctiva - Bilateral-No scleral icterus.  Chest and Lung Exam Chest and lung exam reveals -quiet, even and easy respiratory effort with no use of accessory muscles and on auscultation, normal breath sounds, no adventitious sounds and normal vocal resonance. Inspection Chest Wall - Normal. Back - normal.  Breast Note: Breasts are moderately large. Right breast reveals no mass skin change scars or axillary adenopathy. Left breast reveals 3 scars. There is a curvilinear scar at the lateral areolar margin. There is a curvilinear scar at the 3 o'clock position of the left breast there is a linear incision in the lower outer quadrant of the left breast. I don't really feel a mass, hematoma, or any adenopathy.   Cardiovascular Cardiovascular examination reveals -normal heart sounds, regular rate and rhythm with no murmurs and normal pedal pulses bilaterally.  Abdomen Inspection Inspection of the abdomen reveals - No Hernias. Skin - Scar - no surgical scars. Palpation/Percussion Palpation and Percussion of the abdomen reveal - Soft, Non Tender, No Rebound tenderness, No Rigidity (guarding) and No hepatosplenomegaly. Auscultation Auscultation of the abdomen reveals - Bowel sounds normal.  Neurologic Neurologic evaluation reveals -alert and oriented x 3 with no impairment of recent or remote memory. Mental Status-Normal.  Musculoskeletal Normal Exam - Left-Upper Extremity Strength Normal and Lower Extremity Strength Normal. Normal Exam - Right-Upper Extremity Strength Normal and Lower Extremity Strength Normal.  Lymphatic Head & Neck  General Head & Neck Lymphatics: Bilateral - Description - Normal. Axillary  General Axillary Region: Bilateral - Description - Normal. Tenderness - Non Tender. Femoral & Inguinal  Generalized Femoral & Inguinal Lymphatics: Bilateral - Description - Normal. Tenderness - Non  Tender.    Assessment & Plan  PAPILLOMA OF BREAST, LEFT (D24.2)    Your recent imaging studies and biopsy show an atypical papilloma in the left breast This is the fifth papilloma that you have had This is a high risk finding You also have a strong family history for breast cancer which is also a risk factor  We have talked about different algorithms and strategies. Because there is atypical cells in the biopsy, I have urged you to have a left breast lumpectomy to define whether this is just a papilloma or early cancer You definitely need to be referred to the high risk clinic and to genetic counseling after we define this diagnosis  You have agreed with this plan. you will be scheduled for left breast lumpectomy with radioactive seed localization I have discussed the indications, techniques, and risk of the surgery in detail  [place] (Z80.3) HYPERTENSION, BENIGN (I10)    Trooper Olander M. Dalbert Batman, M.D., [facility] General and Minimally invasive Surgery Breast and Colorectal Surgery Office:   [phone] Pager:   [phone]

## 2017-07-05 ENCOUNTER — Encounter (HOSPITAL_BASED_OUTPATIENT_CLINIC_OR_DEPARTMENT_OTHER): Payer: Self-pay | Admitting: *Deleted

## 2017-07-05 ENCOUNTER — Other Ambulatory Visit: Payer: Self-pay

## 2017-07-07 ENCOUNTER — Encounter (HOSPITAL_BASED_OUTPATIENT_CLINIC_OR_DEPARTMENT_OTHER)
Admission: RE | Admit: 2017-07-07 | Discharge: 2017-07-07 | Disposition: A | Discharge status: Home / Self Care | Payer: No Typology Code available for payment source | Source: Ambulatory Visit | Attending: General Surgery | Admitting: General Surgery

## 2017-07-07 DIAGNOSIS — Z880 Allergy status to penicillin: Secondary | ICD-10-CM | POA: Diagnosis not present

## 2017-07-07 DIAGNOSIS — Z882 Allergy status to sulfonamides status: Secondary | ICD-10-CM | POA: Diagnosis not present

## 2017-07-07 DIAGNOSIS — D242 Benign neoplasm of left breast: Secondary | ICD-10-CM | POA: Diagnosis not present

## 2017-07-07 DIAGNOSIS — E785 Hyperlipidemia, unspecified: Secondary | ICD-10-CM | POA: Diagnosis not present

## 2017-07-07 DIAGNOSIS — N6092 Unspecified benign mammary dysplasia of left breast: Secondary | ICD-10-CM | POA: Diagnosis present

## 2017-07-07 DIAGNOSIS — K219 Gastro-esophageal reflux disease without esophagitis: Secondary | ICD-10-CM | POA: Diagnosis not present

## 2017-07-07 DIAGNOSIS — I10 Essential (primary) hypertension: Secondary | ICD-10-CM | POA: Diagnosis not present

## 2017-07-07 DIAGNOSIS — Z803 Family history of malignant neoplasm of breast: Secondary | ICD-10-CM | POA: Diagnosis not present

## 2017-07-07 DIAGNOSIS — Z79899 Other long term (current) drug therapy: Secondary | ICD-10-CM | POA: Diagnosis not present

## 2017-07-07 LAB — COMPREHENSIVE METABOLIC PANEL
ALT: 14 U/L (ref 14–54)
AST: 20 U/L (ref 15–41)
Albumin: 4 g/dL (ref 3.5–5.0)
Alkaline Phosphatase: 63 U/L (ref 38–126)
Anion gap: 6 (ref 5–15)
BUN: 17 mg/dL (ref 6–20)
CO2: 27 mmol/L (ref 22–32)
Calcium: 9.2 mg/dL (ref 8.9–10.3)
Chloride: 107 mmol/L (ref 101–111)
Creatinine, Ser: 1.17 mg/dL — ABNORMAL HIGH (ref 0.44–1.00)
GFR calc Af Amer: >60 mL/min (ref >60)
GFR calc non Af Amer: 53 mL/min — ABNORMAL LOW (ref >60)
Glucose, Bld: 74 mg/dL (ref 65–99)
Potassium: 4.1 mmol/L (ref 3.5–5.1)
Sodium: 140 mmol/L (ref 135–145)
Total Bilirubin: 0.7 mg/dL (ref 0.3–1.2)
Total Protein: 7.1 g/dL (ref 6.5–8.1)

## 2017-07-07 LAB — CBC WITH DIFFERENTIAL/PLATELET
Basophils Absolute: 0 10*3/uL (ref 0.0–0.1)
Basophils Relative: 0 %
Eosinophils Absolute: 0.2 10*3/uL (ref 0.0–0.7)
Eosinophils Relative: 2 %
HCT: 40.1 % (ref 36.0–46.0)
Hemoglobin: 13.5 g/dL (ref 12.0–15.0)
Lymphocytes Relative: 23 %
Lymphs Abs: 2 10*3/uL (ref 0.7–4.0)
MCH: 31.1 pg (ref 26.0–34.0)
MCHC: 33.7 g/dL (ref 30.0–36.0)
MCV: 92.4 fL (ref 78.0–100.0)
Monocytes Absolute: 0.6 10*3/uL (ref 0.1–1.0)
Monocytes Relative: 7 %
Neutro Abs: 6.1 10*3/uL (ref 1.7–7.7)
Neutrophils Relative %: 68 %
Platelets: 302 10*3/uL (ref 150–400)
RBC: 4.34 MIL/uL (ref 3.87–5.11)
RDW: 12.7 % (ref 11.5–15.5)
WBC: 8.9 10*3/uL (ref 4.0–10.5)

## 2017-07-07 NOTE — Progress Notes (Addendum)
Ensure pre surgery drink given with instructions to complete by Parsons, pt verbalized understanding.  Labs reviewed by Dr. Deatra Canter, will proceed with surgery as scheduled.

## 2017-07-09 ENCOUNTER — Encounter (HOSPITAL_BASED_OUTPATIENT_CLINIC_OR_DEPARTMENT_OTHER): Payer: Self-pay | Admitting: Anesthesiology

## 2017-07-09 ENCOUNTER — Encounter (HOSPITAL_BASED_OUTPATIENT_CLINIC_OR_DEPARTMENT_OTHER)
Admission: RE | Disposition: A | Discharge status: Home / Self Care | Payer: Self-pay | Source: Ambulatory Visit | Attending: General Surgery

## 2017-07-09 ENCOUNTER — Ambulatory Visit (HOSPITAL_BASED_OUTPATIENT_CLINIC_OR_DEPARTMENT_OTHER): Payer: 59 | Admitting: Anesthesiology

## 2017-07-09 ENCOUNTER — Ambulatory Visit (HOSPITAL_BASED_OUTPATIENT_CLINIC_OR_DEPARTMENT_OTHER)
Admission: RE | Admit: 2017-07-09 | Discharge: 2017-07-09 | Disposition: A | Discharge status: Home / Self Care | Payer: 59 | Source: Ambulatory Visit | Attending: General Surgery | Admitting: General Surgery

## 2017-07-09 ENCOUNTER — Other Ambulatory Visit: Payer: Self-pay

## 2017-07-09 DIAGNOSIS — E785 Hyperlipidemia, unspecified: Secondary | ICD-10-CM | POA: Insufficient documentation

## 2017-07-09 DIAGNOSIS — Z882 Allergy status to sulfonamides status: Secondary | ICD-10-CM | POA: Insufficient documentation

## 2017-07-09 DIAGNOSIS — N6092 Unspecified benign mammary dysplasia of left breast: Secondary | ICD-10-CM | POA: Insufficient documentation

## 2017-07-09 DIAGNOSIS — Z803 Family history of malignant neoplasm of breast: Secondary | ICD-10-CM | POA: Insufficient documentation

## 2017-07-09 DIAGNOSIS — Z79899 Other long term (current) drug therapy: Secondary | ICD-10-CM | POA: Insufficient documentation

## 2017-07-09 DIAGNOSIS — D242 Benign neoplasm of left breast: Secondary | ICD-10-CM | POA: Diagnosis present

## 2017-07-09 DIAGNOSIS — I10 Essential (primary) hypertension: Secondary | ICD-10-CM | POA: Insufficient documentation

## 2017-07-09 DIAGNOSIS — Z880 Allergy status to penicillin: Secondary | ICD-10-CM | POA: Insufficient documentation

## 2017-07-09 DIAGNOSIS — K219 Gastro-esophageal reflux disease without esophagitis: Secondary | ICD-10-CM | POA: Insufficient documentation

## 2017-07-09 HISTORY — DX: Benign neoplasm of left breast: D24.2

## 2017-07-09 HISTORY — PX: BREAST LUMPECTOMY WITH RADIOACTIVE SEED LOCALIZATION: SHX6424

## 2017-07-09 HISTORY — DX: Anxiety disorder, unspecified: F41.9

## 2017-07-09 SURGERY — BREAST LUMPECTOMY WITH RADIOACTIVE SEED LOCALIZATION
Anesthesia: General | Site: Breast | Laterality: Left

## 2017-07-09 MED ORDER — FENTANYL CITRATE (PF) 100 MCG/2ML IJ SOLN: 50.0000 ug | INTRAMUSCULAR | Status: DC | PRN

## 2017-07-09 MED ORDER — FENTANYL CITRATE (PF) 100 MCG/2ML IJ SOLN
INTRAMUSCULAR | Status: AC
  Filled 2017-07-09: qty 2

## 2017-07-09 MED ORDER — BUPIVACAINE-EPINEPHRINE (PF) 0.5% -1:200000 IJ SOLN
INTRAMUSCULAR | Status: AC
  Filled 2017-07-09: qty 60

## 2017-07-09 MED ORDER — MIDAZOLAM HCL 2 MG/2ML IJ SOLN
INTRAMUSCULAR | Status: AC
  Filled 2017-07-09: qty 2

## 2017-07-09 MED ORDER — PROPOFOL 10 MG/ML IV BOLUS
INTRAVENOUS | Status: DC | PRN
  Administered 2017-07-09: 150 mg via INTRAVENOUS
  Administered 2017-07-09: 50 mg via INTRAVENOUS

## 2017-07-09 MED ORDER — ACETAMINOPHEN 500 MG PO TABS
1000.0000 mg | ORAL_TABLET | ORAL | Status: AC
  Administered 2017-07-09: 1000 mg via ORAL

## 2017-07-09 MED ORDER — LIDOCAINE HCL (CARDIAC) 20 MG/ML IV SOLN
INTRAVENOUS | Status: DC | PRN
  Administered 2017-07-09: 30 mg via INTRAVENOUS

## 2017-07-09 MED ORDER — OXYCODONE HCL 5 MG PO TABS
ORAL_TABLET | ORAL | Status: AC
  Filled 2017-07-09: qty 1

## 2017-07-09 MED ORDER — METOCLOPRAMIDE HCL 5 MG/ML IJ SOLN: 10.0000 mg | Freq: Once | INTRAMUSCULAR | Status: DC | PRN

## 2017-07-09 MED ORDER — CHLORHEXIDINE GLUCONATE CLOTH 2 % EX PADS: 6.0000 | MEDICATED_PAD | Freq: Once | CUTANEOUS | Status: DC

## 2017-07-09 MED ORDER — FENTANYL CITRATE (PF) 100 MCG/2ML IJ SOLN
INTRAMUSCULAR | Status: DC | PRN
  Administered 2017-07-09: 100 ug via INTRAVENOUS

## 2017-07-09 MED ORDER — MIDAZOLAM HCL 5 MG/5ML IJ SOLN
INTRAMUSCULAR | Status: DC | PRN
  Administered 2017-07-09: 2 mg via INTRAVENOUS

## 2017-07-09 MED ORDER — LACTATED RINGERS IV SOLN
INTRAVENOUS | Status: DC
  Administered 2017-07-09 (×2): via INTRAVENOUS

## 2017-07-09 MED ORDER — ACETAMINOPHEN 500 MG PO TABS
ORAL_TABLET | ORAL | Status: AC
  Filled 2017-07-09: qty 1

## 2017-07-09 MED ORDER — FENTANYL CITRATE (PF) 100 MCG/2ML IJ SOLN
25.0000 ug | INTRAMUSCULAR | Status: DC | PRN
  Administered 2017-07-09: 50 ug via INTRAVENOUS

## 2017-07-09 MED ORDER — DEXAMETHASONE SODIUM PHOSPHATE 10 MG/ML IJ SOLN
INTRAMUSCULAR | Status: AC
  Filled 2017-07-09: qty 1

## 2017-07-09 MED ORDER — HYDROCODONE-ACETAMINOPHEN 7.5-325 MG PO TABS: 1.0000 | ORAL_TABLET | Freq: Once | ORAL | Status: DC | PRN

## 2017-07-09 MED ORDER — MIDAZOLAM HCL 2 MG/2ML IJ SOLN: 1.0000 mg | INTRAMUSCULAR | Status: DC | PRN

## 2017-07-09 MED ORDER — HYDROCODONE-ACETAMINOPHEN 5-325 MG PO TABS: 1.0000 | ORAL_TABLET | Freq: Four times a day (QID) | ORAL | 0 refills | Status: DC | PRN

## 2017-07-09 MED ORDER — GABAPENTIN 300 MG PO CAPS
ORAL_CAPSULE | ORAL | Status: AC
  Filled 2017-07-09: qty 1

## 2017-07-09 MED ORDER — SCOPOLAMINE 1 MG/3DAYS TD PT72
MEDICATED_PATCH | TRANSDERMAL | Status: AC
  Filled 2017-07-09: qty 1

## 2017-07-09 MED ORDER — ONDANSETRON HCL 4 MG/2ML IJ SOLN
INTRAMUSCULAR | Status: AC
  Filled 2017-07-09: qty 2

## 2017-07-09 MED ORDER — CEFAZOLIN SODIUM-DEXTROSE 2-4 GM/100ML-% IV SOLN
INTRAVENOUS | Status: AC
  Filled 2017-07-09: qty 100

## 2017-07-09 MED ORDER — SCOPOLAMINE 1 MG/3DAYS TD PT72
1.0000 | MEDICATED_PATCH | Freq: Once | TRANSDERMAL | Status: DC | PRN
  Administered 2017-07-09: 1.5 mg via TRANSDERMAL

## 2017-07-09 MED ORDER — GABAPENTIN 300 MG PO CAPS
300.0000 mg | ORAL_CAPSULE | ORAL | Status: AC
  Administered 2017-07-09: 300 mg via ORAL

## 2017-07-09 MED ORDER — CELECOXIB 200 MG PO CAPS
200.0000 mg | ORAL_CAPSULE | ORAL | Status: AC
  Administered 2017-07-09: 200 mg via ORAL

## 2017-07-09 MED ORDER — PHENYLEPHRINE HCL 10 MG/ML IJ SOLN
INTRAMUSCULAR | Status: DC | PRN
  Administered 2017-07-09 (×2): 80 ug via INTRAVENOUS

## 2017-07-09 MED ORDER — CEFAZOLIN SODIUM-DEXTROSE 2-4 GM/100ML-% IV SOLN
2.0000 g | INTRAVENOUS | Status: AC
  Administered 2017-07-09: 2 g via INTRAVENOUS

## 2017-07-09 MED ORDER — DEXAMETHASONE SODIUM PHOSPHATE 4 MG/ML IJ SOLN
INTRAMUSCULAR | Status: DC | PRN
  Administered 2017-07-09: 10 mg via INTRAVENOUS

## 2017-07-09 MED ORDER — MEPERIDINE HCL 25 MG/ML IJ SOLN: 6.2500 mg | INTRAMUSCULAR | Status: DC | PRN

## 2017-07-09 MED ORDER — PROPOFOL 10 MG/ML IV BOLUS
INTRAVENOUS | Status: AC
  Filled 2017-07-09: qty 20

## 2017-07-09 MED ORDER — OXYCODONE HCL 5 MG PO TABS
5.0000 mg | ORAL_TABLET | Freq: Once | ORAL | Status: AC
  Administered 2017-07-09: 5 mg via ORAL

## 2017-07-09 MED ORDER — BUPIVACAINE-EPINEPHRINE (PF) 0.5% -1:200000 IJ SOLN
INTRAMUSCULAR | Status: DC | PRN
  Administered 2017-07-09: 8.5 mL

## 2017-07-09 MED ORDER — CELECOXIB 200 MG PO CAPS
ORAL_CAPSULE | ORAL | Status: AC
  Filled 2017-07-09: qty 1

## 2017-07-09 MED ORDER — LIDOCAINE 2% (20 MG/ML) 5 ML SYRINGE
INTRAMUSCULAR | Status: AC
  Filled 2017-07-09: qty 5

## 2017-07-09 MED ORDER — ACETAMINOPHEN 500 MG PO TABS
ORAL_TABLET | ORAL | Status: AC
  Filled 2017-07-09: qty 2

## 2017-07-09 SURGICAL SUPPLY — 64 items
ADH SKN CLS APL DERMABOND .7 (GAUZE/BANDAGES/DRESSINGS) ×1
APL SKNCLS STERI-STRIP NONHPOA (GAUZE/BANDAGES/DRESSINGS)
APPLIER CLIP 9.375 MED OPEN (MISCELLANEOUS) ×2
APR CLP MED 9.3 20 MLT OPN (MISCELLANEOUS) ×1
BENZOIN TINCTURE PRP APPL 2/3 (GAUZE/BANDAGES/DRESSINGS) IMPLANT
BINDER BREAST LRG (GAUZE/BANDAGES/DRESSINGS) ×1 IMPLANT
BINDER BREAST MEDIUM (GAUZE/BANDAGES/DRESSINGS) IMPLANT
BINDER BREAST XLRG (GAUZE/BANDAGES/DRESSINGS) IMPLANT
BINDER BREAST XXLRG (GAUZE/BANDAGES/DRESSINGS) IMPLANT
BLADE HEX COATED 2.75 (ELECTRODE) ×2 IMPLANT
BLADE SURG 10 STRL SS (BLADE) IMPLANT
BLADE SURG 15 STRL LF DISP TIS (BLADE) ×1 IMPLANT
BLADE SURG 15 STRL SS (BLADE) ×2
CANISTER SUC SOCK COL 7IN (MISCELLANEOUS) IMPLANT
CANISTER SUCT 1200ML W/VALVE (MISCELLANEOUS) ×2 IMPLANT
CHLORAPREP W/TINT 26ML (MISCELLANEOUS) ×1 IMPLANT
CLIP APPLIE 9.375 MED OPEN (MISCELLANEOUS) IMPLANT
COVER BACK TABLE 60X90IN (DRAPES) ×2 IMPLANT
COVER MAYO STAND STRL (DRAPES) ×2 IMPLANT
COVER PROBE W GEL 5X96 (DRAPES) ×2 IMPLANT
DECANTER SPIKE VIAL GLASS SM (MISCELLANEOUS) IMPLANT
DERMABOND ADVANCED (GAUZE/BANDAGES/DRESSINGS) ×1
DERMABOND ADVANCED .7 DNX12 (GAUZE/BANDAGES/DRESSINGS) ×1 IMPLANT
DEVICE DUBIN W/COMP PLATE 8390 (MISCELLANEOUS) ×2 IMPLANT
DRAPE LAPAROSCOPIC ABDOMINAL (DRAPES) ×2 IMPLANT
DRAPE UTILITY XL STRL (DRAPES) ×2 IMPLANT
DRSG PAD ABDOMINAL 8X10 ST (GAUZE/BANDAGES/DRESSINGS) ×1 IMPLANT
ELECT REM PT RETURN 9FT ADLT (ELECTROSURGICAL) ×2
ELECTRODE REM PT RTRN 9FT ADLT (ELECTROSURGICAL) ×1 IMPLANT
GAUZE SPONGE 4X4 12PLY STRL LF (GAUZE/BANDAGES/DRESSINGS) IMPLANT
GLOVE BIO SURGEON STRL SZ7 (GLOVE) ×1 IMPLANT
GLOVE EUDERMIC 7 POWDERFREE (GLOVE) ×2 IMPLANT
GLOVE EXAM NITRILE MD LF STRL (GLOVE) ×1 IMPLANT
GOWN STRL REUS W/ TWL LRG LVL3 (GOWN DISPOSABLE) ×1 IMPLANT
GOWN STRL REUS W/ TWL XL LVL3 (GOWN DISPOSABLE) ×1 IMPLANT
GOWN STRL REUS W/TWL LRG LVL3 (GOWN DISPOSABLE)
GOWN STRL REUS W/TWL XL LVL3 (GOWN DISPOSABLE) ×4
ILLUMINATOR WAVEGUIDE N/F (MISCELLANEOUS) IMPLANT
KIT MARKER MARGIN INK (KITS) ×2 IMPLANT
LIGHT WAVEGUIDE WIDE FLAT (MISCELLANEOUS) IMPLANT
NDL HYPO 25X1 1.5 SAFETY (NEEDLE) ×1 IMPLANT
NEEDLE HYPO 25X1 1.5 SAFETY (NEEDLE) ×2 IMPLANT
NS IRRIG 1000ML POUR BTL (IV SOLUTION) ×2 IMPLANT
PACK BASIN DAY SURGERY FS (CUSTOM PROCEDURE TRAY) ×2 IMPLANT
PENCIL BUTTON HOLSTER BLD 10FT (ELECTRODE) ×2 IMPLANT
SHEET MEDIUM DRAPE 40X70 STRL (DRAPES) IMPLANT
SLEEVE SCD COMPRESS KNEE MED (MISCELLANEOUS) ×2 IMPLANT
SPONGE LAP 18X18 X RAY DECT (DISPOSABLE) IMPLANT
SPONGE LAP 4X18 X RAY DECT (DISPOSABLE) ×3 IMPLANT
STRIP CLOSURE SKIN 1/2X4 (GAUZE/BANDAGES/DRESSINGS) IMPLANT
SUT ETHILON 3 0 FSL (SUTURE) IMPLANT
SUT MNCRL AB 4-0 PS2 18 (SUTURE) ×2 IMPLANT
SUT SILK 2 0 SH (SUTURE) ×2 IMPLANT
SUT VIC AB 2-0 CT1 27 (SUTURE)
SUT VIC AB 2-0 CT1 TAPERPNT 27 (SUTURE) IMPLANT
SUT VIC AB 3-0 SH 27 (SUTURE)
SUT VIC AB 3-0 SH 27X BRD (SUTURE) IMPLANT
SUT VICRYL 3-0 CR8 SH (SUTURE) ×2 IMPLANT
SYR 10ML LL (SYRINGE) ×2 IMPLANT
TOWEL OR 17X24 6PK STRL BLUE (TOWEL DISPOSABLE) ×2 IMPLANT
TOWEL OR NON WOVEN STRL DISP B (DISPOSABLE) ×1 IMPLANT
TRAY DSU PREP LF (CUSTOM PROCEDURE TRAY) ×1 IMPLANT
TUBE CONNECTING 20X1/4 (TUBING) ×2 IMPLANT
YANKAUER SUCT BULB TIP NO VENT (SUCTIONS) ×2 IMPLANT

## 2017-07-09 NOTE — Interval H&P Note (Signed)
History and Physical Interval Note:  07/09/2017 8:51 AM  Rachael Russell  has presented today for surgery, with the diagnosis of atypical papillary lesion left breast  The various methods of treatment have been discussed with the patient and family. After consideration of risks, benefits and other options for treatment, the patient has consented to  Procedure(s): LEFT BREAST LUMPECTOMY WITH RADIOACTIVE SEED LOCALIZATION ERAS PATHWAY (Left) as a surgical intervention .  The patient's history has been reviewed, patient examined, no change in status, stable for surgery.  I have reviewed the patient's chart and labs.  Questions were answered to the patient's satisfaction.     [name]

## 2017-07-09 NOTE — Transfer of Care (Signed)
Immediate Anesthesia Transfer of Care Note  Patient: Rachael Russell  Procedure(s) Performed: LEFT BREAST LUMPECTOMY WITH RADIOACTIVE SEED LOCALIZATION (Left Breast)  Patient Location: PACU  Anesthesia Type:General  Level of Consciousness: sedated  Airway & Oxygen Therapy: Patient Spontanous Breathing and Patient connected to face mask oxygen  Post-op Assessment: Report given to RN and Post -op Vital signs reviewed and stable  Post vital signs: Reviewed and stable  Last Vitals:  Vitals:   07/09/17 0832  BP: (!) 141/96  Pulse: 67  Resp: 18  Temp: 36.4 C  SpO2: 100%    Last Pain:  Vitals:   07/09/17 0832  TempSrc: Oral         Complications: No apparent anesthesia complications

## 2017-07-09 NOTE — Anesthesia Preprocedure Evaluation (Signed)
Anesthesia Evaluation  Patient identified by MRN, date of birth, ID band Patient awake    Reviewed: Allergy & Precautions, NPO status , Patient's Chart, lab work & pertinent test results  History of Anesthesia Complications (+) Family history of anesthesia reaction  Airway Mallampati: III  TM Distance: >3 FB Neck ROM: Full    Dental no notable dental hx. (+) Teeth Intact   Pulmonary neg pulmonary ROS,    Pulmonary exam normal breath sounds clear to auscultation       Cardiovascular hypertension, Pt. on medications Normal cardiovascular exam Rhythm:Regular Rate:Normal     Neuro/Psych Anxiety negative neurological ROS     GI/Hepatic Neg liver ROS, GERD  Medicated and Controlled,  Endo/Other  negative endocrine ROSObesity Left Breast mass  Renal/GU negative Renal ROS  negative genitourinary   Musculoskeletal   Abdominal (+) + obese,   Peds  Hematology  (+) anemia ,   Anesthesia Other Findings   Reproductive/Obstetrics                             Anesthesia Physical Anesthesia Plan  ASA: II  Anesthesia Plan: General   Post-op Pain Management:    Induction: Intravenous  PONV Risk Score and Plan: 4 or greater and Ondansetron, Dexamethasone, Scopolamine patch - Pre-op and Treatment may vary due to age or medical condition  Airway Management Planned: LMA  Additional Equipment:   Intra-op Plan:   Post-operative Plan: Extubation in OR  Informed Consent: I have reviewed the patients History and Physical, chart, labs and discussed the procedure including the risks, benefits and alternatives for the proposed anesthesia with the patient or authorized representative who has indicated his/her understanding and acceptance.   Dental advisory given  Plan Discussed with: CRNA, Anesthesiologist and Surgeon  Anesthesia Plan Comments:         Anesthesia Quick Evaluation

## 2017-07-09 NOTE — Op Note (Signed)
Patient Name:           Rachael Russell   Date of Surgery:        07/09/2017  Pre op Diagnosis:      Atypical papillary lesion left breast, 3:00 position  Post op Diagnosis:    Same  Procedure:                 Left breast lumpectomy with radioactive seed localization and margin assessment  Surgeon:                     Edsel Petrin. Dalbert Batman, M.D., FACS  Assistant:                      OR staff  Operative Indications:  This is a very pleasant 51 year old female. Her husband is here with her throughout the encounter. She is referred by Dr. Marcelo Baldy at [facility] mammography for evaluation of atypical papillary lesion left breast. Dr. Rica Mote is her PCP.      She has had multiple papillomas of the left breast. In 2014 we removed two papillomas from the left breast. In 2016 we removed 2 papillomas without atypia. She was referred to the high-risk clinic but for some reason did not go. She says no one called her. Recent imaging studies at [facility] on April 13, 2017 and biopsies on August 29 reveal a cyst at the 4 o'clock position which was aspirated and the fluid was benign on cytology. They also performed ultrasound guided biopsy of an abnormal duct in the left breast at 3 o'clock position, middle depth. Pathology showed atypical papillary lesion. I told her this is a high-risk finding and if advised excision to define her pathology.      Past history reveals the 2 surgeries in the past removing four papillomas. Hypertension. Hyperlipidemia. Basically very healthy.      Family history significant with mother diagnosed with breast cancer 35s. Maternal grandmother diagnosed with breast cancer in her 17s. Paternal aunt had breast cancer. No family history of ovarian or colon cancer or high risk prostate cancer.      We had a very long conversation. We talked about risk factors including multiple papillomas and family history being significant. I told her that she was clearly at increased risk  for cancer in the future. We talked about different strategies. I told that it was important to define the nature of this lesion histologically and to be sure that she did not have cancer at this time. She then clearly needs to be referred to genetic counseling and to the high-risk clinic at the cancer center. She agrees with this.        She'll be scheduled for left breast lumpectomy with radioactive seed localization. She agrees with this plan.    Operative Findings:       The titanium biopsy marker clip and the radioactive seed were immediately adjacent to each other in the left breast at the 3:00 position.  This was relatively superficial, and so I performed skin flaps both medial and lateral.  Anatomically, the broad anterior margin is the skin.  The specimen mammogram looked good with the radioactive seed in the marker clip in the center of the specimen.  Procedure in Detail:          Following the induction of general LMA anesthesia the patient's left breast was prepped and draped in a sterile fashion, surgical timeout was performed, intravenous antibodies were given,  and a field block using 0.5% Marcaine with epinephrine as a local infiltration anesthetic was performed.     Using the neoprobe I identified and planned the area of incision.  Laterally at 3:00.  I chose to make a curvilinear incision that paralleled the areolar margin but was much further lateral.  Lumpectomy was performed using the neoprobe and electrocautery.  The specimen was marked with silk sutures and a 6 color ink kit to orient the pathologist.  Specimen mammogram looked good and the specimen was marked and sent to the lab.  Hemostasis was excellent.  The wound was irrigated.  The lumpectomy cavity was marked with 5 metal clips.  The breast tissues were reapproximated in 2 separate layers with interrupted 3-0 Vicryl and the skin closed with a running subcuticular 4-0 Monocryl and Dermabond.  Dry bandages and a breast binder  were placed.  The patient tolerated the procedure well was taken to PACU in stable condition.  EBL 10 mL.  Counts correct.  Complications none.     Edsel Petrin. Dalbert Batman, M.D., FACS General and Minimally Invasive Surgery Breast and Colorectal Surgery   Addendum: I logged on to the [facility] website and reviewed her prescription medication history  07/09/2017 10:02 AM

## 2017-07-09 NOTE — Discharge Instructions (Signed)
[facility] °Office Phone Number [phone] ° °BREAST BIOPSY/ PARTIAL MASTECTOMY: POST OP INSTRUCTIONS ° °Always review your discharge instruction sheet given to you by the facility where your surgery was performed. ° °IF YOU HAVE DISABILITY OR FAMILY LEAVE FORMS, YOU MUST BRING THEM TO THE OFFICE FOR PROCESSING.  DO NOT GIVE THEM TO YOUR DOCTOR. ° °1. A prescription for pain medication may be given to you upon discharge.  Take your pain medication as prescribed, if needed.  If narcotic pain medicine is not needed, then you may take acetaminophen (Tylenol) or ibuprofen (Advil) as needed. °2. Take your usually prescribed medications unless otherwise directed °3. If you need a refill on your pain medication, please contact your pharmacy.  They will contact our office to request authorization.  Prescriptions will not be filled after 5pm or on week-ends. °4. You should eat very light the first 24 hours after surgery, such as soup, crackers, pudding, etc.  Resume your normal diet the day after surgery. °5. Most patients will experience some swelling and bruising in the breast.  Ice packs and a good support bra will help.  Swelling and bruising can take several days to resolve.  °6. It is common to experience some constipation if taking pain medication after surgery.  Increasing fluid intake and taking a stool softener will usually help or prevent this problem from occurring.  A mild laxative (Milk of Magnesia or Miralax) should be taken according to package directions if there are no bowel movements after 48 hours. °7. Unless discharge instructions indicate otherwise, you may remove your bandages 24-48 hours after surgery, and you may shower at that time.  You may have steri-strips (small skin tapes) in place directly over the incision.  These strips should be left on the skin for 7-10 days.  If your surgeon used skin glue on the incision, you may shower in 24 hours.  The glue will flake off over the  next 2-3 weeks.  Any sutures or staples will be removed at the office during your follow-up visit. °8. ACTIVITIES:  You may resume regular daily activities (gradually increasing) beginning the next day.  Wearing a good support bra or sports bra minimizes pain and swelling.  You may have sexual intercourse when it is comfortable. °a. You may drive when you no longer are taking prescription pain medication, you can comfortably wear a seatbelt, and you can safely maneuver your car and apply brakes. °b. RETURN TO WORK:  ______________________________________________________________________________________ °9. You should see your doctor in the office for a follow-up appointment approximately two weeks after your surgery.  Your doctor’s nurse will typically make your follow-up appointment when she calls you with your pathology report.  Expect your pathology report 2-3 business days after your surgery.  You may call to check if you do not hear from us after three days. °10. OTHER INSTRUCTIONS: _______________________________________________________________________________________________ _____________________________________________________________________________________________________________________________________ °_____________________________________________________________________________________________________________________________________ °_____________________________________________________________________________________________________________________________________ ° °WHEN TO CALL YOUR DOCTOR: °1. Fever over 101.0 °2. Nausea and/or vomiting. °3. Extreme swelling or bruising. °4. Continued bleeding from incision. °5. Increased pain, redness, or drainage from the incision. ° °The clinic staff is available to answer your questions during regular business hours.  Please don’t hesitate to call and ask to speak to one of the nurses for clinical concerns.  If you have a medical emergency, go to the nearest  emergency room or call 911.  A surgeon from [facility] is always on call at the hospital. ° °For further questions, please visit [email or web address]  ° ° ° ° °  Post Anesthesia Home Care Instructions ° °Activity: °Get plenty of rest for the remainder of the day. A responsible individual must stay with you for 24 hours following the procedure.  °For the next 24 hours, DO NOT: °-Drive a car °-Operate machinery °-Drink alcoholic beverages °-Take any medication unless instructed by your physician °-Make any legal decisions or sign important papers. ° °Meals: °Start with liquid foods such as gelatin or soup. Progress to regular foods as tolerated. Avoid greasy, spicy, heavy foods. If nausea and/or vomiting occur, drink only clear liquids until the nausea and/or vomiting subsides. Call your physician if vomiting continues. ° °Special Instructions/Symptoms: °Your throat may feel dry or sore from the anesthesia or the breathing tube placed in your throat during surgery. If this causes discomfort, gargle with warm salt water. The discomfort should disappear within 24 hours. ° °If you had a scopolamine patch placed behind your ear for the management of post- operative nausea and/or vomiting: ° °1. The medication in the patch is effective for 72 hours, after which it should be removed.  Wrap patch in a tissue and discard in the trash. Wash hands thoroughly with soap and water. °2. You may remove the patch earlier than 72 hours if you experience unpleasant side effects which may include dry mouth, dizziness or visual disturbances. °3. Avoid touching the patch. Wash your hands with soap and water after contact with the patch. °  ° °

## 2017-07-09 NOTE — Anesthesia Procedure Notes (Signed)
Procedure Name: LMA Insertion Date/Time: 07/09/2017 9:19 AM Performed by: Marrianne Mood, CRNA Pre-anesthesia Checklist: Patient identified, Emergency Drugs available, Suction available, Patient being monitored and Timeout performed Patient Re-evaluated:Patient Re-evaluated prior to induction Oxygen Delivery Method: Circle system utilized Preoxygenation: Pre-oxygenation with 100% oxygen Induction Type: IV induction Ventilation: Mask ventilation without difficulty LMA: LMA inserted LMA Size: 4.0 Number of attempts: 1 Airway Equipment and Method: Bite block Placement Confirmation: positive ETCO2 Tube secured with: Tape Dental Injury: Teeth and Oropharynx as per pre-operative assessment

## 2017-07-09 NOTE — Anesthesia Postprocedure Evaluation (Signed)
Anesthesia Post Note  Patient: Rachael Russell  Procedure(s) Performed: LEFT BREAST LUMPECTOMY WITH RADIOACTIVE SEED LOCALIZATION (Left Breast)     Patient location during evaluation: PACU Anesthesia Type: General Level of consciousness: awake and alert and oriented Pain management: pain level controlled Vital Signs Assessment: post-procedure vital signs reviewed and stable Respiratory status: spontaneous breathing, nonlabored ventilation and respiratory function stable Cardiovascular status: blood pressure returned to baseline and stable Postop Assessment: no apparent nausea or vomiting Anesthetic complications: no    Last Vitals:  Vitals:   07/09/17 1030 07/09/17 1045  BP:    Pulse: (!) 58 66  Resp: 17 15  Temp:    SpO2: 97% 99%    Last Pain:  Vitals:   07/09/17 1015  TempSrc:   PainSc: 3                  Bill Mcvey A.

## 2017-07-12 ENCOUNTER — Encounter (HOSPITAL_BASED_OUTPATIENT_CLINIC_OR_DEPARTMENT_OTHER): Payer: Self-pay | Admitting: General Surgery

## 2017-07-12 NOTE — Progress Notes (Signed)
Inform patient of Pathology report,. Breast pathology is benign.  There is no cancer.  She had intraductal papilloma and benign ductal hyperplasia. I will discuss this with her in detail at her next office visit Please let me know that she discuss this with her   hmi

## 2017-07-22 ENCOUNTER — Telehealth: Payer: Self-pay | Admitting: Family Medicine

## 2017-07-22 DIAGNOSIS — I10 Essential (primary) hypertension: Secondary | ICD-10-CM

## 2017-07-22 MED ORDER — LISINOPRIL 40 MG PO TABS: 40.0000 mg | ORAL_TABLET | Freq: Every day | ORAL | 0 refills | Status: DC

## 2017-07-22 NOTE — Telephone Encounter (Signed)
Copied from [place] [id number]. Topic: Quick Communication - See Telephone Encounter >> Jul 22, 2017  1:42 PM Bea Graff, NT wrote: CRM for notification. See Telephone encounter for: Patient needing a 14 day supply for Lisinopril called into [facility] in [place].  07/22/17.

## 2017-08-06 ENCOUNTER — Telehealth: Payer: Self-pay | Admitting: Genetic Counselor

## 2017-08-06 NOTE — Telephone Encounter (Signed)
08/06/17 called and spoke with patient @ 11:44 am to confirm her appointment with Rachael Russell on 09/15/17 @ 10:00 am.  Informed patient to arrive 30 minutes prior to appt. to register.

## 2017-08-31 ENCOUNTER — Ambulatory Visit: Payer: Self-pay | Admitting: *Deleted
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
## 2017-08-31 NOTE — Telephone Encounter (Signed)
Agree with advisement to seek care now  I will follow in epic

## 2017-09-08 ENCOUNTER — Ambulatory Visit: Payer: Self-pay

## 2017-09-08 ENCOUNTER — Ambulatory Visit: Payer: Managed Care, Other (non HMO) | Admitting: Family Medicine

## 2017-09-08 ENCOUNTER — Encounter: Payer: Self-pay | Admitting: Family Medicine

## 2017-09-08 VITALS — BP 150/94 | HR 72 | Temp 98.4°F | Ht 65.0 in | Wt 194.0 lb

## 2017-09-08 DIAGNOSIS — I10 Essential (primary) hypertension: Secondary | ICD-10-CM | POA: Diagnosis not present

## 2017-09-08 MED ORDER — AMLODIPINE BESYLATE 5 MG PO TABS: 5.0000 mg | ORAL_TABLET | Freq: Every day | ORAL | 11 refills | Status: DC

## 2017-09-08 NOTE — Telephone Encounter (Signed)
If she prefers to be seen today I can see her at 4:15

## 2017-09-08 NOTE — Telephone Encounter (Signed)
Pt. called office to request an appt. with PCP for elevated BP.  Reported her BP last week, in the ER, was 176/111.  (Reported she happened to be at the ER for another reason, but was informed of the high BP reading.)  Stated this morning, her BP was 143/103 @ 8:30 AM, and 124/102 @ 10:00 AM.  Reported she is taking her BP medications as ordered; took both the Lisinopril and Torsemide at 8:00 AM.  C/o headache, and tightness in her "upper chest/ neck area, that comes and goes."  Stated she was sick last week with a cold.  With further questioning about the chest tightness, she denied any radiation of chest discomfort, nausea, or having any increased sweating.  Denied shortness of breath.  Denied any blurred vision, and numbness/ weakness of face or unilateral extremities.  Reported she feels tired.  Pt. Has an appt. Tomorrow with Dr. Lorelei Pont.  Care advice given per protocol.  Advised to go to the ER if her symptoms worsen or her BP increases to 180/100.  Verb. Understanding.  The pt. Verbalized she would like the nurse to make Dr. Glori Bickers aware of her symptoms/ blood pressure today.  Will send note to the office and call FC. Pt. Agreed.        Reason for Disposition . Systolic BP  >= 161 OR Diastolic >= 096  Answer Assessment - Initial Assessment Questions 1. BLOOD PRESSURE: "What is the blood pressure?" "Did you take at least two measurements 5 minutes apart?"     143/103  @ 8:30   124/102 @ 10:00 AM 2. ONSET: "When did you take your blood pressure?"     This morning 3. HOW: "How did you obtain the blood pressure?" (e.g., visiting nurse, automatic home BP monitor)     Digital machine 4. HISTORY: "Do you have a history of high blood pressure?"     Yes 5. MEDICATIONS: "Are you taking any medications for blood pressure?" "Have you missed any doses recently?"    Lisinopril 40 mg daily;  Torsemide 20 mg daily  6. OTHER SYMPTOMS: "Do you have any symptoms?" (e.g., headache, chest pain, blurred vision,  difficulty breathing, weakness)     C/o headache now @ 5/10; denied blurred vision; c/o tightness in upper chest close to the neck; this comes and goes; denied SOB; c/o being tired; denied weakness   7. PREGNANCY: "Is there any chance you are pregnant?" "When was your last menstrual period?"    No ; husband had vasectomy  Protocols used: HIGH BLOOD PRESSURE-A-AH
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
## 2017-09-08 NOTE — Patient Instructions (Signed)
Continue current medicines and add amlodipine 5 mg once daily   If side effects or problems let me know  Follow up in about 2 weeks for a re check  Bring your cuff with you   Get back on track with healthy diet and exercise

## 2017-09-08 NOTE — Telephone Encounter (Signed)
appt scheduled today per pt request

## 2017-09-09 ENCOUNTER — Ambulatory Visit: Payer: 59 | Admitting: Family Medicine

## 2017-09-09 NOTE — Assessment & Plan Note (Signed)
bp is up with weight gain and poor diet /exercise habits  Disc DASH eating and lifestyle change Add amlodipine 5 mg daily  BP: (!) 150/94    Alert if side effects or problems  F/u planned 2 wk

## 2017-09-14 ENCOUNTER — Telehealth: Payer: Self-pay | Admitting: Genetic Counselor

## 2017-09-14 NOTE — Telephone Encounter (Signed)
Patient called to reschedule appointment due to upcoming inclement weather.

## 2017-09-15 ENCOUNTER — Other Ambulatory Visit: Payer: 59

## 2017-09-15 ENCOUNTER — Encounter: Payer: 59 | Admitting: Genetic Counselor

## 2017-09-17 ENCOUNTER — Telehealth: Payer: Self-pay | Admitting: Genetic Counselor

## 2017-09-17 NOTE — Telephone Encounter (Signed)
Spoke to patient regarding voicemail she left earlier today. Patient wanted to know about insurance covering Gen Counseling appointment.

## 2017-09-20 ENCOUNTER — Other Ambulatory Visit: Payer: Self-pay

## 2017-09-20 ENCOUNTER — Encounter: Payer: Managed Care, Other (non HMO) | Admitting: Genetic Counselor

## 2017-09-22 ENCOUNTER — Encounter: Payer: Self-pay | Admitting: Family Medicine

## 2017-09-22 ENCOUNTER — Ambulatory Visit: Payer: Managed Care, Other (non HMO) | Admitting: Family Medicine

## 2017-09-22 VITALS — BP 120/80 | HR 79 | Temp 98.0°F | Ht 65.0 in | Wt 187.5 lb

## 2017-09-22 DIAGNOSIS — I10 Essential (primary) hypertension: Secondary | ICD-10-CM

## 2017-09-22 NOTE — Progress Notes (Signed)
Subjective:    Patient ID: Rachael Russell, female    DOB: [birth year], 52 y.o.   MRN: [id number]  HPI Here for f/u of chronic health problems  Last visit added amlodipine 5 mg for uncontrolled HTN  Also discussed DASH eating and health habits   Wt Readings from Last 3 Encounters:  09/22/17 187 lb 8 oz (85 kg)  09/08/17 194 lb (88 kg)  07/09/17 188 lb (85.3 kg)  wt is down also! Really working on it  Drinking lots of water  Gets up and moves every 30 min at work  Planning an exercise program- planning a kickboxing class  31.20 kg/m   bp is much improved today No cp or palpitations or headaches or edema  No side effects to medicines  BP Readings from Last 3 Encounters:  09/22/17 126/84  09/08/17 (!) 150/94  07/09/17 (!) 131/91    Feels a lot better also  Re check BP: 120/80   No side effects from the amlodipine   Also stress is decreased  Finished a big celebration with a lot of responsibility     Chemistry      Component Value Date/Time   NA 140 07/07/2017 1250   NA 139 04/28/2017 1000   K 4.1 07/07/2017 1250   CL 107 07/07/2017 1250   CO2 27 07/07/2017 1250   BUN 17 07/07/2017 1250   BUN 14 04/28/2017 1000   CREATININE 1.17 (H) 07/07/2017 1250      Component Value Date/Time   CALCIUM 9.2 07/07/2017 1250   ALKPHOS 63 07/07/2017 1250   AST 20 07/07/2017 1250   ALT 14 07/07/2017 1250   BILITOT 0.7 07/07/2017 1250   BILITOT 0.3 04/28/2017 1000      Lab Results  Component Value Date   WBC 8.9 07/07/2017   HGB 13.5 07/07/2017   HCT 40.1 07/07/2017   MCV 92.4 07/07/2017   PLT 302 07/07/2017    Lab Results  Component Value Date   TSH 1.820 04/28/2017     Patient Active Problem List   Diagnosis Date Noted  . Papilloma of left breast 07/09/2017  . Pedal edema 02/23/2017  . Heavy menses 04/28/2016  . Vitamin D deficiency 04/26/2015  . Fatigue 11/06/2014  . Hemorrhoids, external 10/13/2013  . Cervical disc disorder with radiculopathy of cervical  region 09/15/2013  . Herpes zoster 03/07/2013  . 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 Routine general medical examination at a health care facility 05/11/2011  . Palpitations 12/19/2010  . Allergic rhinitis 12/19/2010  . Fibrocystic breast disease 12/19/2010  . GERD 04/14/2010  . 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 SINUSITIS, CHRONIC FRONTAL 04/22/2007  . Iron deficiency anemia 01/04/2007  . Hyperlipidemia 12/21/2006  . Essential hypertension 12/21/2006   Past Medical History:  Diagnosis Date  . Anemia   . Anxiety   . Family history of adverse reaction to anesthesia    mother has difficult intubation  . Fibroadenoma of breast, left   . Fibrocystic breast, right   . GERD (gastroesophageal reflux disease)   . Hyperlipidemia   . Hypertension   . Irritable bowel syndrome (IBS)   . Menorrhagia   . Papilloma of left breast 07/09/2017  . Wears glasses    Past Surgical History:  Procedure Laterality Date  . BREAST DUCTAL SYSTEM EXCISION Left 11/18/2012   Procedure: EXCISION DUCTAL SYSTEM BREAST;  Surgeon: [name], MD;  Location: [facility];  Service: General;  Laterality:  Left;  . BREAST LUMPECTOMY WITH NEEDLE LOCALIZATION Left 11/18/2012   Procedure: excise ductal system left breast. subarealar. left partial mastectomy with radiographic guidance;  Surgeon: [name], MD;  Location: [facility];  Service: General;  Laterality: Left;  excise ductal system left breast. subarealar. left partial mastectomy with radiographic guidance  . BREAST LUMPECTOMY WITH NEEDLE LOCALIZATION Left 03/27/2015   Procedure: LEFT BREAST LUMPECTOMY  WITH NEEDLE LOCALIZATION TIMES TWO;  Surgeon: Fanny Skates, MD;  Location: [facility];  Service: General;  Laterality: Left;  . BREAST LUMPECTOMY WITH RADIOACTIVE SEED LOCALIZATION Left 07/09/2017   Procedure: LEFT BREAST LUMPECTOMY WITH RADIOACTIVE SEED LOCALIZATION;  Surgeon: Fanny Skates, MD;  Location: [facility];  Service: General;  Laterality: Left;  ERAS PATHWAY  . CESAREAN SECTION  [id number]  . DILATION AND CURETTAGE OF UTERUS  1998  . DILITATION & CURRETTAGE/HYSTROSCOPY WITH NOVASURE ABLATION N/A 08/05/2016   Procedure: DILATATION & CURETTAGE/HYSTEROSCOPY WITH NOVASURE ABLATION;  Surgeon: Princess Bruins, MD;  Location: [facility];  Service: Gynecology;  Laterality: N/A;  . TONSILLECTOMY  age 33  . UMBILICAL HERNIA REPAIR  2006   Social History   Tobacco Use  . Smoking status: Never Smoker  . Smokeless tobacco: Never Used  Substance Use Topics  . Alcohol use: Yes    Comment: occasional  . Drug use: No   Family History  Problem Relation Age of Onset  . Hypertension Mother   . Arthritis Mother        osteoarthritis  . Breast cancer Mother   . Hypertension Father   . Arthritis Father        osteoarthritis  . Breast cancer Maternal Grandmother   . Breast cancer Paternal Aunt    Allergies  Allergen Reactions  . Ferrous Sulfate     constipation  . Penicillins Other (See Comments)    Unknown childhood reaction  . Prilosec [Omeprazole Magnesium]     Dizziness   . Chlorhexidine Gluconate Rash   Current Outpatient Medications on File Prior to Visit  Medication Sig Dispense Refill  . amLODipine (NORVASC) 5 MG tablet Take 1 tablet (5 mg total) by mouth daily. 30 tablet 11  . Cholecalciferol 2000 units TABS Take 1 tablet by mouth daily.    . ferrous sulfate 325 (65 FE) MG tablet Take 325 mg by mouth daily with breakfast.    . lisinopril (PRINIVIL,ZESTRIL) 40 MG tablet Take 1 tablet (40 mg total) by mouth daily. 14 tablet 0  . ranitidine (ZANTAC) 150 MG tablet Take 1 tablet (150 mg total) by mouth 2 (two) times daily. (Patient taking differently: Take 150 mg by mouth 2 (two) times daily as needed. ) 180 tablet 3  . torsemide (DEMADEX) 20 MG tablet Take 1 tablet (20 mg total) by mouth daily. 90 tablet 3   No current  facility-administered medications on file prior to visit.     Review of Systems  Constitutional: Negative for activity change, appetite change, fatigue, fever and unexpected weight change.  HENT: Negative for congestion, ear pain, rhinorrhea, sinus pressure and sore throat.   Eyes: Negative for pain, redness and visual disturbance.  Respiratory: Negative for cough, shortness of breath and wheezing.   Cardiovascular: Negative for chest pain and palpitations.  Gastrointestinal: Negative for abdominal pain, blood in stool, constipation and diarrhea.  Endocrine: Negative for polydipsia and polyuria.  Genitourinary: Negative for dysuria, frequency and urgency.  Musculoskeletal: Negative for arthralgias, back pain and myalgias.  Skin: Negative for  pallor and rash.  Allergic/Immunologic: Negative for environmental allergies.  Neurological: Negative for dizziness, syncope and headaches.  Hematological: Negative for adenopathy. Does not bruise/bleed easily.  Psychiatric/Behavioral: Negative for decreased concentration and dysphoric mood. The patient is not nervous/anxious.        Pos for stressors that are improved       Objective:   Physical Exam  Constitutional: She appears well-developed and well-nourished. No distress.  overwt and well app  HENT:  Head: Normocephalic and atraumatic.  Mouth/Throat: Oropharynx is clear and moist.  Eyes: Conjunctivae and EOM are normal. Pupils are equal, round, and reactive to light.  Neck: Normal range of motion. Neck supple. No JVD present. Carotid bruit is not present. No thyromegaly present.  Cardiovascular: Normal rate, regular rhythm, normal heart sounds and intact distal pulses. Exam reveals no gallop.  Pulmonary/Chest: Effort normal and breath sounds normal. No respiratory distress. She has no wheezes. She has no rales.  No crackles  Abdominal: Soft. Bowel sounds are normal. She exhibits no distension, no abdominal bruit and no mass. There is no  tenderness.  Musculoskeletal: She exhibits no edema.  Lymphadenopathy:    She has no cervical adenopathy.  Neurological: She is alert. She has normal reflexes.  Skin: Skin is warm and dry. No rash noted.  Psychiatric: She has a normal mood and affect.          Assessment & Plan:   Problem List Items Addressed This Visit      Cardiovascular and Mediastinum   Essential hypertension - Primary    Improved with add of amlodipine and tolerating well  Also red in stressors  BP: 120/80  Disc good health habits as well

## 2017-09-22 NOTE — Patient Instructions (Addendum)
Continue the amlodipine  No change in other medicines  Keep working on healthy habits and weight loss   Take care of yourself

## 2017-09-23 NOTE — Assessment & Plan Note (Signed)
Improved with add of amlodipine and tolerating well  Also red in stressors  BP: 120/80  Disc good health habits as well

## 2017-09-29 ENCOUNTER — Encounter: Payer: Self-pay | Admitting: Genetic Counselor

## 2017-10-11 ENCOUNTER — Inpatient Hospital Stay: Payer: Managed Care, Other (non HMO) | Attending: Genetic Counselor | Admitting: Genetic Counselor

## 2017-10-11 ENCOUNTER — Encounter: Payer: Self-pay | Admitting: Genetic Counselor

## 2017-10-11 ENCOUNTER — Inpatient Hospital Stay: Payer: Managed Care, Other (non HMO)

## 2017-10-11 DIAGNOSIS — Z803 Family history of malignant neoplasm of breast: Secondary | ICD-10-CM | POA: Diagnosis not present

## 2017-10-11 DIAGNOSIS — D242 Benign neoplasm of left breast: Secondary | ICD-10-CM

## 2017-10-11 DIAGNOSIS — Z8042 Family history of malignant neoplasm of prostate: Secondary | ICD-10-CM | POA: Diagnosis not present

## 2017-10-11 NOTE — Progress Notes (Signed)
REFERRING PROVIDER: Abner Greenspan, MD [place], [place] [address]  PRIMARY PROVIDER:  Abner Greenspan, MD  PRIMARY REASON FOR VISIT:  1. Papilloma of left breast   2. Family history of breast cancer   3. Family history of prostate cancer      HISTORY OF PRESENT ILLNESS:   Rachael Russell, a 52 y.o. female, was seen for a [facility] cancer genetics consultation at the request of Dr. Glori Bickers due to a family history of cancer.  Rachael Russell presents to clinic today to discuss the possibility of a hereditary predisposition to cancer, genetic testing, and to further clarify her future cancer risks, as well as potential cancer risks for family members.   Rachael Russell is a 52 y.o. female with no personal history of cancer.  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  She had one in 2014, two in 2016, and another in 2018.  CANCER HISTORY:   No history exists.     HORMONAL RISK FACTORS:  Menarche was at age 21.  First live birth at age 61.  OCP use for approximately 19 years.  Ovaries intact: yes.  Hysterectomy: no.  Menopausal status: perimenopausal.  HRT use: 0 years. Colonoscopy: yes; normal. Mammogram within the last year: yes. Number of breast biopsies: 4. Up to date with pelvic exams:  yes. Any excessive radiation exposure in the past:  no  Past Medical History:  Diagnosis Date  . Anemia   . Anxiety   . Family history of adverse reaction to anesthesia    mother has difficult intubation  . Family history of breast cancer   . Family history of prostate cancer   . Fibroadenoma of breast, left   . Fibrocystic breast, right   . GERD (gastroesophageal reflux disease)   . Hyperlipidemia   . Hypertension   . Irritable bowel syndrome (IBS)   . Menorrhagia   . Papilloma of left breast 07/09/2017  . Wears glasses     Past Surgical History:  Procedure Laterality Date  . BREAST DUCTAL SYSTEM EXCISION Left 11/18/2012   Procedure:  EXCISION DUCTAL SYSTEM BREAST;  Surgeon: [name], MD;  Location: [facility];  Service: General;  Laterality: Left;  . BREAST LUMPECTOMY WITH NEEDLE LOCALIZATION Left 11/18/2012   Procedure: excise ductal system left breast. subarealar. left partial mastectomy with radiographic guidance;  Surgeon: [name], MD;  Location: [facility];  Service: General;  Laterality: Left;  excise ductal system left breast. subarealar. left partial mastectomy with radiographic guidance  . BREAST LUMPECTOMY WITH NEEDLE LOCALIZATION Left 03/27/2015   Procedure: LEFT BREAST LUMPECTOMY  WITH NEEDLE LOCALIZATION TIMES TWO;  Surgeon: Fanny Skates, MD;  Location: [facility];  Service: General;  Laterality: Left;  . BREAST LUMPECTOMY WITH RADIOACTIVE SEED LOCALIZATION Left 07/09/2017   Procedure: LEFT BREAST LUMPECTOMY WITH RADIOACTIVE SEED LOCALIZATION;  Surgeon: Fanny Skates, MD;  Location: [facility];  Service: General;  Laterality: Left;  ERAS PATHWAY  . CESAREAN SECTION  [id number]  . DILATION AND CURETTAGE OF UTERUS  1998  . DILITATION & CURRETTAGE/HYSTROSCOPY WITH NOVASURE ABLATION N/A 08/05/2016   Procedure: DILATATION & CURETTAGE/HYSTEROSCOPY WITH NOVASURE ABLATION;  Surgeon: Princess Bruins, MD;  Location: [facility];  Service: Gynecology;  Laterality: N/A;  . TONSILLECTOMY  age 15  . UMBILICAL HERNIA REPAIR  2006    Social History   Socioeconomic History  . Marital status: Married  Spouse name: Not on file  . Number of children: 2  . Years of education: Not on file  . Highest education level: Not on file  Social Needs  . Financial resource strain: Not on file  . Food insecurity - worry: Not on file  . Food insecurity - inability: Not on file  . Transportation needs - medical: Not on file  . Transportation needs - non-medical: Not on file  Occupational History  . Occupation: Data processing manager    Comment: Runner, broadcasting/film/video- foster care  Tobacco Use  . Smoking status: Never Smoker  . Smokeless tobacco: Never Used  Substance and Sexual Activity  . Alcohol use: Yes    Comment: occasional  . Drug use: No  . Sexual activity: Not on file    Comment: vasectomy  Other Topics Concern  . Not on file  Social History Narrative  . Not on file     FAMILY HISTORY:  We obtained a detailed, 4-generation family history.  Significant diagnoses are listed below: Family History  Problem Relation Age of Onset  . Hypertension Mother   . Arthritis Mother        osteoarthritis  . Breast cancer Mother 29  . Hypertension Father   . Arthritis Father        osteoarthritis  . Breast cancer Maternal Grandmother 62       bilateral breast cancer  . Breast cancer Paternal Aunt 38       bilateral breast cancer  . Prostate cancer Paternal Uncle   . Tuberculosis Paternal Grandmother   . Prostate cancer Paternal Uncle     The patient has two boys who are cancer free.  She has one brother who is healthy, as are his four children.  Both parents are living.  The patient's mother was diagnosed with breast cancer at 42.  She has two maternal half brothers who are cancer free.  She does not know what her grandfather died from.  Her grandmother was diagnosed with bilateral breast cancer at 23 and had a double mastectomy.  The patient's father had four brothers and two sisters.  One sister had bilateral breast cancer at 24.  Two brothers have been diagnosed with prostate cancer.  The paternal grandmother died after childbirth from TB.  The grandfather died from unknown causes.  Rachael Russell is unaware of previous family history of genetic testing for hereditary cancer risks. Patient's maternal ancestors are of African American descent, and paternal ancestors are of African American descent. There is no reported Ashkenazi Jewish ancestry. There is no known consanguinity.  GENETIC COUNSELING ASSESSMENT: Rachael Russell is a 52 y.o.  female with a personal history of papilloma's and family history of breast and prostate cancer which is somewhat suggestive of a hereditary cancer syndrome and predisposition to cancer. We, therefore, discussed and recommended the following at today's visit.   DISCUSSION: We discussed that about 5-10% of breast cancer is hereditary, with most cases of hereditary breast cancer due to BRCA mutations.  We reviewed the characteristics, features and inheritance patterns of hereditary cancer syndromes. We also discussed genetic testing, including the appropriate family members to test, the process of testing, insurance coverage and turn-around-time for results. We discussed the implications of a negative, positive and/or variant of uncertain significant result. We discussed with Rachael Russell that the family history is not highly consistent with a familial hereditary cancer syndrome.  All family members diagnosed with breast cancer were at older ages.  While she is  just shy of meeting medical criteria in several ways, we feel she is at low risk to harbor a gene mutation associated with such a condition. We discussed that we could send a preverification to [employer] [facility], the testing company where she is in network through her insurance.  If she does not meet criteria for testing, and would have to do a self pay, we would then recommend going through [facility] as their self-pay cost is lower.  Today is President's day, and therefore the laboratories are closed.  We have submitted a preverification for [facility] will hopefully hear back soon.    PLAN: We will contact the different laboratories we are considering, [facility] and [facility], to determine her out of pocket cost for testing.  Once we determine what the cost will be, we will compare costs between the companies, and order testing based on Rachael Russell' decision.  Lastly, we encouraged Rachael Russell to remain in contact with cancer genetics annually so that we can  continuously update the family history and inform her of any changes in cancer genetics and testing that may be of benefit for this family.   Ms.  Russell questions were answered to her satisfaction today. Our contact information was provided should additional questions or concerns arise. Thank you for the referral and allowing [place] to share in the care of your patient.   Leora Platt P. Florene Glen, [place], [facility] Certified Genetic Counselor [email or web address] phone: [phone]  The patient was seen for a total of 45 minutes in face-to-face genetic counseling.  This patient was discussed with Drs. Magrinat, Lindi Adie and/or Burr Medico who agrees with the above.    _______________________________________________________________________ For Office Staff:  Number of people involved in session: 1 Was an Intern/ student involved with case: no

## 2017-10-19 ENCOUNTER — Telehealth: Payer: Self-pay | Admitting: Genetic Counselor

## 2017-10-19 NOTE — Telephone Encounter (Signed)
LM on VM with good news on her preauthorization.  Asked that she CB.

## 2017-10-22 NOTE — Telephone Encounter (Signed)
LM on VM with good news on preauthorization.  Asked that she CB and provided CB instructions.

## 2017-10-22 NOTE — Telephone Encounter (Signed)
Reported to patient that the billing investigation indicated that her OOP cost would be $0.  Patient will call next week to schedule an appointment for a blood draw.

## 2017-11-02 ENCOUNTER — Inpatient Hospital Stay: Payer: Managed Care, Other (non HMO) | Attending: Genetic Counselor

## 2017-12-01 ENCOUNTER — Encounter: Payer: Self-pay | Admitting: Genetic Counselor

## 2017-12-01 ENCOUNTER — Telehealth: Payer: Self-pay | Admitting: Genetic Counselor

## 2017-12-01 DIAGNOSIS — Z1379 Encounter for other screening for genetic and chromosomal anomalies: Secondary | ICD-10-CM | POA: Insufficient documentation

## 2017-12-01 NOTE — Telephone Encounter (Signed)
Revealed negative genetic testing.  Discussed that we do not know why she has  Developed multiple papillomas, or why there is cancer in the family. It could be due to a different gene that we are not testing, or maybe our current technology may not be able to pick something up.  It will be important for her to keep in contact with genetics to keep up with whether additional testing may be needed.

## 2018-01-18 ENCOUNTER — Encounter: Payer: Self-pay | Admitting: Genetic Counselor

## 2018-01-18 ENCOUNTER — Ambulatory Visit: Payer: Self-pay | Admitting: Genetic Counselor

## 2018-01-18 DIAGNOSIS — Z8042 Family history of malignant neoplasm of prostate: Secondary | ICD-10-CM

## 2018-01-18 DIAGNOSIS — Z803 Family history of malignant neoplasm of breast: Secondary | ICD-10-CM

## 2018-01-18 DIAGNOSIS — Z1379 Encounter for other screening for genetic and chromosomal anomalies: Secondary | ICD-10-CM

## 2018-01-18 NOTE — Progress Notes (Signed)
HPI:  Ms. Renshaw was previously seen in the [facility] due to a family history of cancer and concerns regarding a hereditary predisposition to cancer. Please refer to our prior cancer genetics clinic note for more information regarding Ms. Aveni's medical, social and family histories, and our assessment and recommendations, at the time. Ms. Dack recent genetic test results were disclosed to her, as were recommendations warranted by these results. These results and recommendations are discussed in more detail below.  CANCER HISTORY:   No history exists.    FAMILY HISTORY:  We obtained a detailed, 4-generation family history.  Significant diagnoses are listed below: Family History  Problem Relation Age of Onset  . Hypertension Mother   . Arthritis Mother        osteoarthritis  . Breast cancer Mother 89  . Hypertension Father   . Arthritis Father        osteoarthritis  . Breast cancer Maternal Grandmother 62       bilateral breast cancer  . Breast cancer Paternal Aunt 60       bilateral breast cancer  . Prostate cancer Paternal Uncle   . Tuberculosis Paternal Grandmother   . Prostate cancer Paternal Uncle     The patient has two boys who are cancer free.  She has one brother who is healthy, as are his four children.  Both parents are living.  The patient's mother was diagnosed with breast cancer at 75.  She has two maternal half brothers who are cancer free.  She does not know what her grandfather died from.  Her grandmother was diagnosed with bilateral breast cancer at 50 and had a double mastectomy.  The patient's father had four brothers and two sisters.  One sister had bilateral breast cancer at 75.  Two brothers have been diagnosed with prostate cancer.  The paternal grandmother died after childbirth from TB.  The grandfather died from unknown causes.  Ms. Northcraft is unaware of previous family history of genetic testing for hereditary cancer risks.  Patient's maternal ancestors are of African American descent, and paternal ancestors are of African American descent. There is no reported Ashkenazi Jewish ancestry. There is no known consanguinity.  GENETIC TEST RESULTS: Genetic testing reported out on December 01, 2017 through the [place] cancer panel found no deleterious mutations.  The CancerNext gene panel offered by [employer] includes sequencing and rearrangement analysis for the following 34 genes:   APC, ATM, BARD1, BMPR1A, BRCA1, BRCA2, BRIP1, CDH1, CDK4, CDKN2A, CHEK2, DICER1, HOXB13, EPCAM, GREM1, MLH1, MRE11A, MSH2, MSH6, MUTYH, NBN, NF1, PALB2, PMS2, POLD1, POLE, PTEN, RAD50, RAD51C, RAD51D, SMAD4, SMARCA4, STK11, and TP53.  The test report has been scanned into EPIC and is located under the Molecular Pathology section of the Results Review tab.    We discussed with Ms. Swinford that since the current genetic testing is not perfect, it is possible there may be a gene mutation in one of these genes that current testing cannot detect, but that chance is small.  We also discussed, that it is possible that another gene that has not yet been discovered, or that we have not yet tested, is responsible for the cancer diagnoses in the family, and it is, therefore, important to remain in touch with cancer genetics in the future so that we can continue to offer Ms. Sissel the most up to date genetic testing.   Genetic testing did detect two Variants of Unknown Significance - one in the CDKN2A gene called  p.A60T and the other in the RB1 gene called [id number]. At this time, it is unknown if these variants are associated with increased cancer risk or if they are a normal finding, but most variants such as these get reclassified to being inconsequential. They should not be used to make medical management decisions. With time, we suspect the lab will determine the significance of these variants, if any. If we do learn more about them, we will try to  contact Ms. Kleinpeter to discuss it further. However, it is important to stay in touch with [place] periodically and keep the address and phone number up to date.   CANCER SCREENING RECOMMENDATIONS: This result is reassuring and indicates that Ms. Slatten likely does not have an increased risk for a future cancer due to a mutation in one of these genes. This normal test also suggests that Ms. Escorcia's cancer was most likely not due to an inherited predisposition associated with one of these genes.  Most cancers happen by chance and this negative test suggests that her cancer falls into this category.  We, therefore, recommended she continue to follow the cancer management and screening guidelines provided by her oncology and primary healthcare provider.   An individual's cancer risk and medical management are not determined by genetic test results alone. Overall cancer risk assessment incorporates additional factors, including personal medical history, family history, and any available genetic information that may result in a personalized plan for cancer prevention and surveillance.  RECOMMENDATIONS FOR FAMILY MEMBERS:  Women in this family might be at some increased risk of developing cancer, over the general population risk, simply due to the family history of cancer.  We recommended women in this family have a yearly mammogram beginning at age 38, or 87 years younger than the earliest onset of cancer, an annual clinical breast exam, and perform monthly breast self-exams. Women in this family should also have a gynecological exam as recommended by their primary provider. All family members should have a colonoscopy by age 58.  FOLLOW-UP: Lastly, we discussed with Ms. Munar that cancer genetics is a rapidly advancing field and it is possible that new genetic tests will be appropriate for her and/or her family members in the future. We encouraged her to remain in contact with cancer genetics on an annual basis so we  can update her personal and family histories and let her know of advances in cancer genetics that may benefit this family.   Our contact number was provided. Ms. Gallogly questions were answered to her satisfaction, and she knows she is welcome to call us at anytime with additional questions or concerns.   Roma Kayser, MS, [facility] Certified Genetic Counselor [email or web address]

## 2018-04-01 ENCOUNTER — Encounter: Payer: Self-pay | Admitting: Family Medicine

## 2018-04-01 LAB — HM MAMMOGRAPHY

## 2018-04-24 ENCOUNTER — Telehealth: Payer: Self-pay | Admitting: Family Medicine

## 2018-04-24 DIAGNOSIS — E559 Vitamin D deficiency, unspecified: Secondary | ICD-10-CM

## 2018-04-24 DIAGNOSIS — E7800 Pure hypercholesterolemia, unspecified: Secondary | ICD-10-CM

## 2018-04-24 DIAGNOSIS — I10 Essential (primary) hypertension: Secondary | ICD-10-CM

## 2018-04-24 DIAGNOSIS — Z0000 Encounter for general adult medical examination without abnormal findings: Secondary | ICD-10-CM

## 2018-04-24 DIAGNOSIS — D509 Iron deficiency anemia, unspecified: Secondary | ICD-10-CM

## 2018-04-24 NOTE — Telephone Encounter (Signed)
-----   Message from Ellamae Sia sent at 04/19/2018  8:52 AM EDT ----- Regarding: Lab orders for Thursday, 9.5.19 Patient is scheduled for CPX labs, please order future labs, Thanks , Karna Christmas

## 2018-04-28 ENCOUNTER — Other Ambulatory Visit (INDEPENDENT_AMBULATORY_CARE_PROVIDER_SITE_OTHER): Payer: Managed Care, Other (non HMO)

## 2018-04-28 DIAGNOSIS — E7800 Pure hypercholesterolemia, unspecified: Secondary | ICD-10-CM

## 2018-04-28 DIAGNOSIS — E559 Vitamin D deficiency, unspecified: Secondary | ICD-10-CM

## 2018-04-28 DIAGNOSIS — I10 Essential (primary) hypertension: Secondary | ICD-10-CM

## 2018-04-28 NOTE — Addendum Note (Signed)
Addended by: Ellamae Sia on: 04/28/2018 09:00 AM   Modules accepted: Orders

## 2018-04-28 NOTE — Addendum Note (Signed)
Addended by: Ellamae Sia on: 04/28/2018 08:59 AM   Modules accepted: Orders

## 2018-04-29 LAB — COMPREHENSIVE METABOLIC PANEL
ALT: 14 IU/L (ref 0–32)
AST: 20 IU/L (ref 0–40)
Albumin/Globulin Ratio: 1.6 (ref 1.2–2.2)
Albumin: 4.5 g/dL (ref 3.5–5.5)
Alkaline Phosphatase: 71 IU/L (ref 39–117)
BUN/Creatinine Ratio: 12 (ref 9–23)
BUN: 13 mg/dL (ref 6–24)
Bilirubin Total: 0.5 mg/dL (ref 0.0–1.2)
CO2: 23 mmol/L (ref 20–29)
Calcium: 9.7 mg/dL (ref 8.7–10.2)
Chloride: 98 mmol/L (ref 96–106)
Creatinine, Ser: 1.11 mg/dL — ABNORMAL HIGH (ref 0.57–1.00)
GFR calc Af Amer: 66 mL/min/{1.73_m2} (ref >59)
GFR calc non Af Amer: 57 mL/min/{1.73_m2} — ABNORMAL LOW (ref >59)
Globulin, Total: 2.8 g/dL (ref 1.5–4.5)
Glucose: 87 mg/dL (ref 65–99)
Potassium: 4 mmol/L (ref 3.5–5.2)
Sodium: 136 mmol/L (ref 134–144)
Total Protein: 7.3 g/dL (ref 6.0–8.5)

## 2018-04-29 LAB — CBC WITH DIFFERENTIAL/PLATELET
Basophils Absolute: 0 10*3/uL (ref 0.0–0.2)
Basos: 0 %
EOS (ABSOLUTE): 0 10*3/uL (ref 0.0–0.4)
Eos: 0 %
Hematocrit: 38.5 % (ref 34.0–46.6)
Hemoglobin: 13.1 g/dL (ref 11.1–15.9)
Immature Grans (Abs): 0 10*3/uL (ref 0.0–0.1)
Immature Granulocytes: 0 %
Lymphocytes Absolute: 1.6 10*3/uL (ref 0.7–3.1)
Lymphs: 17 %
MCH: 30.6 pg (ref 26.6–33.0)
MCHC: 34 g/dL (ref 31.5–35.7)
MCV: 90 fL (ref 79–97)
Monocytes Absolute: 0.5 10*3/uL (ref 0.1–0.9)
Monocytes: 5 %
Neutrophils Absolute: 6.9 10*3/uL (ref 1.4–7.0)
Neutrophils: 78 %
Platelets: 297 10*3/uL (ref 150–450)
RBC: 4.28 x10E6/uL (ref 3.77–5.28)
RDW: 12.1 % — ABNORMAL LOW (ref 12.3–15.4)
WBC: 9 10*3/uL (ref 3.4–10.8)

## 2018-04-29 LAB — LIPID PANEL
Chol/HDL Ratio: 3.6 ratio (ref 0.0–4.4)
Cholesterol, Total: 273 mg/dL — ABNORMAL HIGH (ref 100–199)
HDL: 75 mg/dL (ref >39)
LDL Calculated: 186 mg/dL — ABNORMAL HIGH (ref 0–99)
Triglycerides: 60 mg/dL (ref 0–149)
VLDL Cholesterol Cal: 12 mg/dL (ref 5–40)

## 2018-04-29 LAB — VITAMIN D 25 HYDROXY (VIT D DEFICIENCY, FRACTURES): Vit D, 25-Hydroxy: 25.2 ng/mL — ABNORMAL LOW (ref 30.0–100.0)

## 2018-04-29 LAB — TSH: TSH: 1.27 u[IU]/mL (ref 0.450–4.500)

## 2018-05-02 ENCOUNTER — Ambulatory Visit (INDEPENDENT_AMBULATORY_CARE_PROVIDER_SITE_OTHER): Payer: Managed Care, Other (non HMO) | Admitting: Family Medicine

## 2018-05-02 ENCOUNTER — Encounter: Payer: Self-pay | Admitting: Family Medicine

## 2018-05-02 VITALS — BP 130/72 | HR 72 | Temp 98.1°F | Ht 65.0 in | Wt 184.0 lb

## 2018-05-02 DIAGNOSIS — E559 Vitamin D deficiency, unspecified: Secondary | ICD-10-CM

## 2018-05-02 DIAGNOSIS — E7800 Pure hypercholesterolemia, unspecified: Secondary | ICD-10-CM

## 2018-05-02 DIAGNOSIS — N6012 Diffuse cystic mastopathy of left breast: Secondary | ICD-10-CM | POA: Diagnosis not present

## 2018-05-02 DIAGNOSIS — Z23 Encounter for immunization: Secondary | ICD-10-CM | POA: Diagnosis not present

## 2018-05-02 DIAGNOSIS — I10 Essential (primary) hypertension: Secondary | ICD-10-CM | POA: Diagnosis not present

## 2018-05-02 DIAGNOSIS — Z0000 Encounter for general adult medical examination without abnormal findings: Secondary | ICD-10-CM | POA: Diagnosis not present

## 2018-05-02 MED ORDER — LISINOPRIL 40 MG PO TABS: 40.0000 mg | ORAL_TABLET | Freq: Every day | ORAL | 3 refills | Status: DC

## 2018-05-02 MED ORDER — ATORVASTATIN CALCIUM 10 MG PO TABS: 10.0000 mg | ORAL_TABLET | Freq: Every day | ORAL | 11 refills | Status: DC

## 2018-05-02 MED ORDER — AMLODIPINE BESYLATE 5 MG PO TABS: 5.0000 mg | ORAL_TABLET | Freq: Every day | ORAL | 3 refills | Status: DC

## 2018-05-02 MED ORDER — TORSEMIDE 20 MG PO TABS: 20.0000 mg | ORAL_TABLET | Freq: Every day | ORAL | 3 refills | Status: DC

## 2018-05-02 MED ORDER — RANITIDINE HCL 150 MG PO TABS: 150.0000 mg | ORAL_TABLET | Freq: Two times a day (BID) | ORAL | 3 refills | Status: DC | PRN

## 2018-05-02 NOTE — Assessment & Plan Note (Signed)
Disc goals for lipids and reasons to control them Rev last labs with pt Rev low sat fat diet in detail LDL continues to climb despite better diet  Will start atorvastatin 10 mg daily  Lab in 6 wk Alert if side effect (like muscle pain)

## 2018-05-02 NOTE — Progress Notes (Signed)
Subjective:    Patient ID: Rachael Russell, female    DOB: 1965-10-01, 52 y.o.   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  30.62 kg/m   Flu shot today as well   Mammogram 8/17-hx of breast papillomas , had one in august  Self breast exam - hard to assess for changes - "everything feels the same" Had genetic testing (strong fam hx)- neg   Gyn - Dr Kennon Rounds / will schedule follow up   Tetanus shot 8/10   Pap 10/18- ascus with neg HPV reflex Sees gyn and 3 y recommended for next pap   Colonoscopy 2/18 -nl with 10 y recall    bp is stable today  No cp or palpitations or headaches or edema  No side effects to medicines  BP Readings from Last 3 Encounters:  05/02/18 130/72  09/22/17 120/80  09/08/17 (!) 150/94     Hyperlipidemia Lab Results  Component Value Date   CHOL 273 (H) 04/28/2018   CHOL 254 (H) 02/26/2017   CHOL 207 (H) 04/23/2016   Lab Results  Component Value Date   HDL 75 04/28/2018   HDL 72 02/26/2017   HDL 65 04/23/2016   Lab Results  Component Value Date   LDLCALC 186 (H) 04/28/2018   LDLCALC 166 (H) 02/26/2017   LDLCALC 131 (H) 04/23/2016   Lab Results  Component Value Date   TRIG 60 04/28/2018   TRIG 79 02/26/2017   TRIG 55 04/23/2016   Lab Results  Component Value Date   CHOLHDL 3.6 04/28/2018   CHOLHDL 3.5 02/26/2017   CHOLHDL 3.2 04/23/2016   Lab Results  Component Value Date   LDLDIRECT 149.9 05/11/2011   LDLDIRECT 123.2 01/03/2007  was on keto diet  Now weight watchers   She is ready to start statin    H/o  vit D def Level 25.2 She just started back taking it   Lab Results  Component Value Date   WBC 9.0 04/28/2018   HGB 13.1 04/28/2018   HCT 38.5 04/28/2018   MCV 90 04/28/2018   PLT 297 04/28/2018    Lab Results  Component Value Date   CREATININE 1.11 (H) 04/28/2018   BUN 13 04/28/2018   NA 136 04/28/2018   K 4.0 04/28/2018   CL 98 04/28/2018   CO2 23 04/28/2018   Lab Results  Component Value Date   ALT 14 04/28/2018   AST 20 04/28/2018   ALKPHOS 71 04/28/2018   BILITOT 0.5 04/28/2018    Lab Results  Component Value Date   TSH 1.270 04/28/2018    Patient Active Problem List   Diagnosis Date Noted  . Genetic testing 12/01/2017  . Family history of breast cancer   . Family history of prostate cancer   . Papilloma of left breast 07/09/2017  . Pedal edema 02/23/2017  . Heavy menses 04/28/2016  . Vitamin D deficiency 04/26/2015  . Fatigue 11/06/2014  . Hemorrhoids, external 10/13/2013  . Cervical disc disorder  with radiculopathy of cervical region 09/15/2013  . History of herpes zoster 03/07/2013  . 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 Routine general medical examination at a health care facility 05/11/2011  . Allergic rhinitis 12/19/2010  . Fibrocystic breast disease 12/19/2010  . GERD 04/14/2010  . SINUSITIS, CHRONIC FRONTAL 04/22/2007  . Hyperlipidemia 12/21/2006  . Essential hypertension 12/21/2006   Past Medical History:  Diagnosis Date  . Anemia   . Anxiety   . Family history of adverse reaction to anesthesia    mother has difficult intubation  . Family history of breast cancer   . Family history of prostate cancer   . Fibroadenoma of breast, left   . Fibrocystic breast, right   . GERD (gastroesophageal reflux disease)   . Hyperlipidemia   . Hypertension   . Irritable bowel syndrome (IBS)   . Menorrhagia   . Papilloma of left breast 07/09/2017  . Wears glasses    Past Surgical History:  Procedure Laterality Date  . BREAST DUCTAL SYSTEM  EXCISION Left 11/18/2012   Procedure: EXCISION DUCTAL SYSTEM BREAST;  Surgeon: [name], MD;  Location: [place];  Service: General;  Laterality: Left;  . BREAST LUMPECTOMY WITH NEEDLE LOCALIZATION Left 11/18/2012   Procedure: excise ductal system left breast. subarealar. left partial mastectomy with radiographic guidance;  Surgeon: [name], MD;  Location: [place];  Service: General;  Laterality: Left;  excise ductal system left breast. subarealar. left partial mastectomy with radiographic guidance  . BREAST LUMPECTOMY WITH NEEDLE LOCALIZATION Left 03/27/2015   Procedure: LEFT BREAST LUMPECTOMY  WITH NEEDLE LOCALIZATION TIMES TWO;  Surgeon: Fanny Skates, MD;  Location: [place];  Service: General;  Laterality: Left;  . BREAST LUMPECTOMY WITH RADIOACTIVE SEED LOCALIZATION Left 07/09/2017   Procedure: LEFT BREAST LUMPECTOMY WITH RADIOACTIVE SEED LOCALIZATION;  Surgeon: Fanny Skates, MD;  Location: [place];  Service: General;  Laterality: Left;  ERAS PATHWAY  . CESAREAN SECTION  [id number]  . DILATION AND CURETTAGE OF UTERUS  1998  . DILITATION & CURRETTAGE/HYSTROSCOPY WITH NOVASURE ABLATION N/A 08/05/2016   Procedure: DILATATION & CURETTAGE/HYSTEROSCOPY WITH NOVASURE ABLATION;  Surgeon: Princess Bruins, MD;  Location: [place];  Service: Gynecology;  Laterality: N/A;  . TONSILLECTOMY  age 64  . UMBILICAL HERNIA REPAIR  2006   Social History   Tobacco Use  . Smoking status: Never Smoker  . Smokeless tobacco: Never Used  Substance Use Topics  . Alcohol use: Yes    Comment: occasional  . Drug use: No   Family History  Problem Relation Age of Onset  . Hypertension Mother   . Arthritis Mother        osteoarthritis  . Breast cancer Mother 69  . Hypertension Father   . Arthritis Father        osteoarthritis  . Breast cancer Maternal Grandmother 62       bilateral breast cancer  . Breast cancer Paternal  Aunt 32       bilateral breast cancer  . Prostate cancer Paternal Uncle   . Tuberculosis Paternal Grandmother   . Prostate cancer Paternal Uncle    Allergies  Allergen Reactions  . Ferrous Sulfate     constipation  . Penicillins Other (See Comments)    Unknown childhood reaction  . Prilosec [Omeprazole Magnesium]     Dizziness   . Chlorhexidine Gluconate Rash   Current Outpatient Medications on File Prior to Visit  Medication Sig Dispense Refill  .  Cholecalciferol 2000 units TABS Take 1 tablet by mouth daily.    . ferrous sulfate 325 (65 FE) MG tablet Take 325 mg by mouth daily with breakfast.     No current facility-administered medications on file prior to visit.     Review of Systems  Constitutional: Negative for activity change, appetite change, fatigue, fever and unexpected weight change.  HENT: Negative for congestion, ear pain, rhinorrhea, sinus pressure and sore throat.   Eyes: Negative for pain, redness and visual disturbance.  Respiratory: Negative for cough, shortness of breath and wheezing.   Cardiovascular: Negative for chest pain and palpitations.  Gastrointestinal: Negative for abdominal pain, blood in stool, constipation and diarrhea.  Endocrine: Negative for polydipsia and polyuria.  Genitourinary: Negative for dysuria, frequency and urgency.  Musculoskeletal: Negative for arthralgias, back pain and myalgias.  Skin: Negative for pallor and rash.  Allergic/Immunologic: Negative for environmental allergies.  Neurological: Negative for dizziness, syncope and headaches.  Hematological: Negative for adenopathy. Does not bruise/bleed easily.  Psychiatric/Behavioral: Negative for decreased concentration and dysphoric mood. The patient is not nervous/anxious.        Objective:   Physical Exam  Constitutional: She appears well-developed and well-nourished. No distress.  overwt and well appearing   HENT:  Head: Normocephalic and atraumatic.  Right Ear: External  ear normal.  Left Ear: External ear normal.  Mouth/Throat: Oropharynx is clear and moist.  Eyes: Pupils are equal, round, and reactive to light. Conjunctivae and EOM are normal. No scleral icterus.  Neck: Normal range of motion. Neck supple. No JVD present. Carotid bruit is not present. No thyromegaly present.  Cardiovascular: Normal rate, regular rhythm, normal heart sounds and intact distal pulses. Exam reveals no gallop.  Pulmonary/Chest: Effort normal and breath sounds normal. No respiratory distress. She has no wheezes. She exhibits no tenderness. No breast tenderness, discharge or bleeding.  Abdominal: Soft. Bowel sounds are normal. She exhibits no distension, no abdominal bruit and no mass. There is no tenderness.  Genitourinary: No breast tenderness, discharge or bleeding.  Genitourinary Comments: Breast exam: No mass, nodules, thickening, tenderness, bulging, retraction, inflamation, nipple discharge or skin changes noted.  No axillary or clavicular LA.    Very dense tissue Scarring felt under scar on L breast   Musculoskeletal: Normal range of motion. She exhibits no edema or tenderness.  Lymphadenopathy:    She has no cervical adenopathy.  Neurological: She is alert. She has normal reflexes. She displays normal reflexes. No cranial nerve deficit. She exhibits normal muscle tone. Coordination normal.  Skin: Skin is warm and dry. No rash noted. No erythema. No pallor.  Psychiatric: She has a normal mood and affect.  Pleasant           Assessment & Plan:   Problem List Items Addressed This Visit      Cardiovascular and Mediastinum   Essential hypertension    bp in fair control at this time  BP Readings from Last 1 Encounters:  05/02/18 130/72   No changes needed Most recent labs reviewed  Disc lifstyle change with low sodium diet and exercise        Relevant Medications   atorvastatin (LIPITOR) 10 MG tablet   amLODipine (NORVASC) 5 MG tablet   lisinopril  (PRINIVIL,ZESTRIL) 40 MG tablet   torsemide (DEMADEX) 20 MG tablet     Other   Ductal papillomatosis of breast    Doing well overall - s/p surgery on L  Recent mammogram clear Dense tissue on exam No nipple d/c  Genetic testing is re assuring       Hyperlipidemia    Disc goals for lipids and reasons to control them Rev last labs with pt Rev low sat fat diet in detail LDL continues to climb despite better diet  Will start atorvastatin 10 mg daily  Lab in 6 wk Alert if side effect (like muscle pain)      Relevant Medications   atorvastatin (LIPITOR) 10 MG tablet   amLODipine (NORVASC) 5 MG tablet   lisinopril (PRINIVIL,ZESTRIL) 40 MG tablet   torsemide (DEMADEX) 20 MG tablet   Routine general medical examination at a health care facility - Primary    Reviewed health habits including diet and exercise and skin cancer prevention Reviewed appropriate screening tests for age  Also reviewed health mt list, fam hx and immunization status , as well as social and family history   See HPI Labs reviewed Flu shot today  Mammogram date noted/breast exam done  Will f/u gyn for annual exam  Likely beginning menopause       Vitamin D deficiency    Not compliant with dosing Disc imp of this for bone and overall health Inc to 4000 iu D3 daily        Other Visit Diagnoses    Need for influenza vaccination       Relevant Orders   Flu Vaccine QUAD 6+ mos PF IM (Fluarix Quad PF) (Completed)

## 2018-05-02 NOTE — Assessment & Plan Note (Signed)
Not compliant with dosing Disc imp of this for bone and overall health Inc to 4000 iu D3 daily

## 2018-05-02 NOTE — Assessment & Plan Note (Signed)
bp in fair control at this time  BP Readings from Last 1 Encounters:  05/02/18 130/72   No changes needed Most recent labs reviewed  Disc lifstyle change with low sodium diet and exercise

## 2018-05-02 NOTE — Assessment & Plan Note (Signed)
Reviewed health habits including diet and exercise and skin cancer prevention Reviewed appropriate screening tests for age  Also reviewed health mt list, fam hx and immunization status , as well as social and family history   See HPI Labs reviewed Flu shot today  Mammogram date noted/breast exam done  Will f/u gyn for annual exam  Likely beginning menopause

## 2018-05-02 NOTE — Patient Instructions (Addendum)
Start with atorvastatin - 10 mg once daily - in the evening  If any intolerable side effects - like increased muscle pain , let us know  We will check labs for cholesterol in 6 weeks   Increase vitamin D3 - to 4000 iu daily   For cholesterol Avoid red meat/ fried foods/ egg yolks/ fatty breakfast meats/ butter, cheese and high fat dairy/ and shellfish    Keep up the good diet and exercise

## 2018-05-02 NOTE — Assessment & Plan Note (Signed)
Doing well overall - s/p surgery on L  Recent mammogram clear Dense tissue on exam No nipple d/c Genetic testing is re assuring

## 2018-06-17 ENCOUNTER — Other Ambulatory Visit (INDEPENDENT_AMBULATORY_CARE_PROVIDER_SITE_OTHER): Payer: Managed Care, Other (non HMO)

## 2018-06-17 DIAGNOSIS — E7800 Pure hypercholesterolemia, unspecified: Secondary | ICD-10-CM

## 2018-06-17 NOTE — Addendum Note (Signed)
Addended by: Lendon Collar on: 06/17/2018 09:56 AM   Modules accepted: Orders

## 2018-06-18 LAB — LIPID PANEL
Chol/HDL Ratio: 3 ratio (ref 0.0–4.4)
Cholesterol, Total: 211 mg/dL — ABNORMAL HIGH (ref 100–199)
HDL: 70 mg/dL (ref >39)
LDL Calculated: 131 mg/dL — ABNORMAL HIGH (ref 0–99)
Triglycerides: 52 mg/dL (ref 0–149)
VLDL Cholesterol Cal: 10 mg/dL (ref 5–40)

## 2018-06-18 LAB — AST: AST: 16 IU/L (ref 0–40)

## 2018-06-18 LAB — ALT: ALT: 11 IU/L (ref 0–32)

## 2018-07-19 ENCOUNTER — Telehealth: Payer: Self-pay | Admitting: Family Medicine

## 2018-07-19 MED ORDER — ATORVASTATIN CALCIUM 10 MG PO TABS: 10.0000 mg | ORAL_TABLET | Freq: Every day | ORAL | 10 refills | Status: DC

## 2018-07-19 NOTE — Telephone Encounter (Signed)
Pt called office stating she needs the refill request for the Atorvastatin to be sent to [facility] on [address] [place]. Pt states she will only use the [facility] pharmacy for just the Atorvastatin.

## 2018-07-19 NOTE — Telephone Encounter (Signed)
Rx sent 

## 2018-07-25 ENCOUNTER — Encounter: Payer: Self-pay | Admitting: Family Medicine

## 2018-07-25 ENCOUNTER — Ambulatory Visit (INDEPENDENT_AMBULATORY_CARE_PROVIDER_SITE_OTHER): Payer: Managed Care, Other (non HMO) | Admitting: Family Medicine

## 2018-07-25 VITALS — BP 137/83 | HR 76 | Ht 65.0 in | Wt 183.0 lb

## 2018-07-25 DIAGNOSIS — Z01411 Encounter for gynecological examination (general) (routine) with abnormal findings: Secondary | ICD-10-CM

## 2018-07-25 DIAGNOSIS — Z803 Family history of malignant neoplasm of breast: Secondary | ICD-10-CM

## 2018-07-25 DIAGNOSIS — Z124 Encounter for screening for malignant neoplasm of cervix: Secondary | ICD-10-CM

## 2018-07-25 DIAGNOSIS — N915 Oligomenorrhea, unspecified: Secondary | ICD-10-CM

## 2018-07-25 NOTE — Progress Notes (Signed)
Had Mammogram in August- Normal Has a boil on left labia she would like checked and thinks she is going through menopause.

## 2018-07-25 NOTE — Patient Instructions (Signed)
Preventive Care 40-64 Years, Female Preventive care refers to lifestyle choices and visits with your health care provider that can promote health and wellness. What does preventive care include?  A yearly physical exam. This is also called an annual well check.  Dental exams once or twice a year.  Routine eye exams. Ask your health care provider how often you should have your eyes checked.  Personal lifestyle choices, including: ? Daily care of your teeth and gums. ? Regular physical activity. ? Eating a healthy diet. ? Avoiding tobacco and drug use. ? Limiting alcohol use. ? Practicing safe sex. ? Taking low-dose aspirin daily starting at age 58. ? Taking vitamin and mineral supplements as recommended by your health care provider. What happens during an annual well check? The services and screenings done by your health care provider during your annual well check will depend on your age, overall health, lifestyle risk factors, and family history of disease. Counseling Your health care provider may ask you questions about your:  Alcohol use.  Tobacco use.  Drug use.  Emotional well-being.  Home and relationship well-being.  Sexual activity.  Eating habits.  Work and work Statistician.  Method of birth control.  Menstrual cycle.  Pregnancy history.  Screening You may have the following tests or measurements:  Height, weight, and BMI.  Blood pressure.  Lipid and cholesterol levels. These may be checked every 5 years, or more frequently if you are over 81 years old.  Skin check.  Lung cancer screening. You may have this screening every year starting at age 78 if you have a 30-pack-year history of smoking and currently smoke or have quit within the past 15 years.  Fecal occult blood test (FOBT) of the stool. You may have this test every year starting at age 65.  Flexible sigmoidoscopy or colonoscopy. You may have a sigmoidoscopy every 5 years or a colonoscopy  every 10 years starting at age 30.  Hepatitis C blood test.  Hepatitis B blood test.  Sexually transmitted disease (STD) testing.  Diabetes screening. This is done by checking your blood sugar (glucose) after you have not eaten for a while (fasting). You may have this done every 1-3 years.  Mammogram. This may be done every 1-2 years. Talk to your health care provider about when you should start having regular mammograms. This may depend on whether you have a family history of breast cancer.  BRCA-related cancer screening. This may be done if you have a family history of breast, ovarian, tubal, or peritoneal cancers.  Pelvic exam and Pap test. This may be done every 3 years starting at age 80. Starting at age 36, this may be done every 5 years if you have a Pap test in combination with an HPV test.  Bone density scan. This is done to screen for osteoporosis. You may have this scan if you are at high risk for osteoporosis.  Discuss your test results, treatment options, and if necessary, the need for more tests with your health care provider. Vaccines Your health care provider may recommend certain vaccines, such as:  Influenza vaccine. This is recommended every year.  Tetanus, diphtheria, and acellular pertussis (Tdap, Td) vaccine. You may need a Td booster every 10 years.  Varicella vaccine. You may need this if you have not been vaccinated.  Zoster vaccine. You may need this after age 5.  Measles, mumps, and rubella (MMR) vaccine. You may need at least one dose of MMR if you were born in  1957 or later. You may also need a second dose.  Pneumococcal 13-valent conjugate (PCV13) vaccine. You may need this if you have certain conditions and were not previously vaccinated.  Pneumococcal polysaccharide (PPSV23) vaccine. You may need one or two doses if you smoke cigarettes or if you have certain conditions.  Meningococcal vaccine. You may need this if you have certain  conditions.  Hepatitis A vaccine. You may need this if you have certain conditions or if you travel or work in places where you may be exposed to hepatitis A.  Hepatitis B vaccine. You may need this if you have certain conditions or if you travel or work in places where you may be exposed to hepatitis B.  Haemophilus influenzae type b (Hib) vaccine. You may need this if you have certain conditions.  Talk to your health care provider about which screenings and vaccines you need and how often you need them. This information is not intended to replace advice given to you by your health care provider. Make sure you discuss any questions you have with your health care provider. Document Released: 09/06/2015 Document Revised: 04/29/2016 Document Reviewed: 06/11/2015 Elsevier Interactive Patient Education  2018 Elsevier Inc.  

## 2018-07-25 NOTE — Progress Notes (Signed)
  Subjective:     Rachael Russell is a 52 y.o. female and is here for a comprehensive physical exam. The patient reports no problems. 2 cycles in April, 1 in May, 1 in June, 1 in July, 1 in September and 1 in October. Have become very light. She is also having some hot flashes. Husband works for [employer]. 2 boys in college at [employer]. 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 Husband recently became a Theme park manager of a [employer].  The following portions of the patient's history were reviewed and updated as appropriate: allergies, current medications, past family history, past medical history, past social history, past surgical history and problem list.  Review of Systems Pertinent items noted in HPI and remainder of comprehensive ROS otherwise negative.   Objective:    BP 137/83   Pulse 76   Ht _0  (1.651 m)   Wt 183 lb (83 kg)   LMP 06/18/2018 (Exact Date)   BMI 30.45 kg/m  General appearance: alert, cooperative and appears stated age Head: Normocephalic, without obvious abnormality, atraumatic Neck: no adenopathy, supple, symmetrical, trachea midline and thyroid not enlarged, symmetric, no tenderness/mass/nodules Lungs: clear to auscultation bilaterally Breasts: normal appearance, no masses or tenderness Heart: regular rate and rhythm, S1, S2 normal, no murmur, click, rub or gallop Abdomen: soft, non-tender; bowel sounds normal; no masses,  no organomegaly Pelvic: cervix normal in appearance, external genitalia normal, no adnexal masses or tenderness, no cervical motion tenderness, uterus normal size, shape, and consistency and vagina normal without discharge Extremities: extremities normal, atraumatic, no cyanosis or edema Pulses: 2+ and symmetric Skin: Skin color, texture, turgor normal. No rashes or lesions Lymph nodes: Cervical, supraclavicular, and axillary nodes normal. Neurologic: Grossly normal    Assessment:    Healthy  female exam.      Plan:      Problem List Items Addressed This Visit      Unprioritized   Family history of breast cancer    2 variants of undetermined significance. Negative for BRCA       Other Visit Diagnoses    Encounter for gynecological examination with abnormal finding    -  Primary   Hypomenorrhea/oligomenorrhea       will check FSH   Relevant Orders   Follicle stimulating hormone (Completed)   Screening for cervical cancer       Relevant Orders   Pap IG and HPV (high risk) DNA detection     Return in 1 year (on 07/26/2019).  See After Visit Summary for Counseling Recommendations

## 2018-07-26 LAB — FOLLICLE STIMULATING HORMONE: FSH: 75.5 m[IU]/mL

## 2018-07-26 NOTE — Assessment & Plan Note (Signed)
2 variants of undetermined significance. Negative for BRCA

## 2018-07-27 LAB — PAP IG AND HPV HIGH-RISK
HPV, high-risk: NEGATIVE
PAP Smear Comment: 0

## 2018-08-08 ENCOUNTER — Encounter: Payer: Self-pay | Admitting: Family Medicine

## 2018-08-08 ENCOUNTER — Ambulatory Visit: Payer: Managed Care, Other (non HMO) | Admitting: Family Medicine

## 2018-08-08 ENCOUNTER — Ambulatory Visit (INDEPENDENT_AMBULATORY_CARE_PROVIDER_SITE_OTHER)
Admission: RE | Admit: 2018-08-08 | Discharge: 2018-08-08 | Disposition: A | Discharge status: Home / Self Care | Payer: Managed Care, Other (non HMO) | Source: Ambulatory Visit | Attending: Family Medicine | Admitting: Family Medicine

## 2018-08-08 ENCOUNTER — Telehealth: Payer: Self-pay

## 2018-08-08 VITALS — BP 112/68 | HR 78 | Temp 97.4°F | Ht 65.0 in | Wt 183.5 lb

## 2018-08-08 DIAGNOSIS — M5441 Lumbago with sciatica, right side: Secondary | ICD-10-CM | POA: Diagnosis not present

## 2018-08-08 DIAGNOSIS — M545 Low back pain, unspecified: Secondary | ICD-10-CM | POA: Insufficient documentation

## 2018-08-08 MED ORDER — PREDNISONE 20 MG PO TABS: ORAL_TABLET | ORAL | 0 refills | Status: DC

## 2018-08-08 MED ORDER — CYCLOBENZAPRINE HCL 10 MG PO TABS: 10.0000 mg | ORAL_TABLET | Freq: Three times a day (TID) | ORAL | 0 refills | Status: DC | PRN

## 2018-08-08 NOTE — Assessment & Plan Note (Signed)
Acute onset lumbar pain R sided with radicular symptoms to R leg Xray now (h/o deg change)  Reassuring exam but in mod to severe pain with limited rom  Urgent ref to PT Prednisone taper 60 mg  Flexeril prn spasm  Update if not starting to improve in a week or if worsening
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
## 2018-08-08 NOTE — Telephone Encounter (Signed)
On 08/07/18 pt started with shooting pain down rt leg from hip to foot. Pt has hx of sciatica.pt had been working out.  If pt is off feet pain is not bad. Pt said if standing pain worsens and has some difficulty in walking due to pain and muscle cramping. Pt already has appt to see Dr Glori Bickers 08/08/18 at 12 noon. Pt will cb if pain worsens prior to appt. Offered pt sooner appt but she said as long as sitting 12 noon was fine. FYI to Dr Glori Bickers.

## 2018-08-08 NOTE — Patient Instructions (Signed)
Use ice or heat as needed   Take prednisone taper as directed Flexeril with caution of sedation  Tylenol is ok   Ref to PT asap !  Xray now - we will call you with a result

## 2018-08-08 NOTE — Telephone Encounter (Signed)
I will see her then  

## 2018-08-10 ENCOUNTER — Telehealth: Payer: Self-pay

## 2018-08-10 DIAGNOSIS — M5441 Lumbago with sciatica, right side: Secondary | ICD-10-CM

## 2018-08-10 MED ORDER — TRAMADOL HCL 50 MG PO TABS: 50.0000 mg | ORAL_TABLET | Freq: Three times a day (TID) | ORAL | 0 refills | Status: AC | PRN

## 2018-08-10 NOTE — Telephone Encounter (Signed)
Spoke to pt. She will let us know by Friday if she is not doing any better with the tramadol

## 2018-08-10 NOTE — Telephone Encounter (Signed)
I sent tramadol to her pharmacy Use great caution of sedation (and dizziness)  Hope this helps If no further improvement in 1-2 days I want to order MRI so would she please update [place]?   Thanks  Hope she feels better soon

## 2018-08-10 NOTE — Telephone Encounter (Signed)
Pt called with update on sciatica pain. Pt just finished PT and pain level now is 9. Pt last seen 07/29/18. Pt has been taking flexeril, prednisone and going to PT. Pt is also taking 2 tabs of extra strength tylenol q 8 hr for pain without any relief of sciatic pain. Pt has not been able to rest at night since seen on 08/08/18. Pt request med for pain to [facility] [place]. Pt did say the flexeril helps the muscle spasm but not the pain.

## 2018-08-15 NOTE — Addendum Note (Signed)
Addended by: Loura Pardon A on: 08/15/2018 04:49 PM   Modules accepted: Orders

## 2018-08-15 NOTE — Telephone Encounter (Signed)
I will go ahead and order MRI of LS  Will put it in as urgent so she will get called tomorrow   Thanks for letting me know and sorry to hear she is not improving

## 2018-08-15 NOTE — Telephone Encounter (Signed)
Spoke to pt who states she was to contact office with update. She is still having numbness, tingling and burning, and she has not had any relief with tramadol. pls advise

## 2018-08-16 NOTE — Telephone Encounter (Signed)
Rachael Russell spoke with pt and she is working on MRI order now

## 2018-08-22 ENCOUNTER — Telehealth: Payer: Self-pay

## 2018-08-22 MED ORDER — PREDNISONE 20 MG PO TABS: ORAL_TABLET | ORAL | 0 refills | Status: DC

## 2018-08-22 MED ORDER — CYCLOBENZAPRINE HCL 10 MG PO TABS: 10.0000 mg | ORAL_TABLET | Freq: Three times a day (TID) | ORAL | 0 refills | Status: DC | PRN

## 2018-08-22 NOTE — Telephone Encounter (Signed)
Pt called;pt was seen 08/08/18. pt has MRI of LS spine scheduled for 08/25/18; pt is going to PT. Pt still having back pain and burning, tingling sensation in rt leg from hip to toes.and Pt wants to know if prednisone and flexeril could be refilled. Also the tramadol has not helped the pain in back and rt leg. PT advised pt that the burning sensation was inflammation and that is why pt wants to get refills on prednisone and flexeril.[facility].

## 2018-08-22 NOTE — Telephone Encounter (Signed)
Please refill both prednisone and flexeril once  Thanks for the update  Will watch for MRI report

## 2018-08-22 NOTE — Telephone Encounter (Signed)
Pt notified of Dr. Tower's comments and Rx sent to pharmacy  

## 2018-08-25 ENCOUNTER — Ambulatory Visit
Admission: RE | Admit: 2018-08-25 | Discharge: 2018-08-25 | Disposition: A | Discharge status: Home / Self Care | Payer: Managed Care, Other (non HMO) | Source: Ambulatory Visit | Attending: Family Medicine | Admitting: Family Medicine

## 2018-08-25 DIAGNOSIS — M5441 Lumbago with sciatica, right side: Secondary | ICD-10-CM

## 2018-10-28 ENCOUNTER — Telehealth: Payer: Self-pay

## 2018-10-28 ENCOUNTER — Ambulatory Visit: Payer: Managed Care, Other (non HMO) | Admitting: Family Medicine

## 2018-10-28 ENCOUNTER — Encounter: Payer: Self-pay | Admitting: Family Medicine

## 2018-10-28 VITALS — BP 140/90 | HR 75 | Temp 97.9°F | Ht 65.0 in | Wt 187.2 lb

## 2018-10-28 DIAGNOSIS — I10 Essential (primary) hypertension: Secondary | ICD-10-CM

## 2018-10-28 DIAGNOSIS — R5382 Chronic fatigue, unspecified: Secondary | ICD-10-CM | POA: Diagnosis not present

## 2018-10-28 DIAGNOSIS — R600 Localized edema: Secondary | ICD-10-CM | POA: Diagnosis not present

## 2018-10-28 DIAGNOSIS — R0789 Other chest pain: Secondary | ICD-10-CM | POA: Diagnosis not present

## 2018-10-28 DIAGNOSIS — N6452 Nipple discharge: Secondary | ICD-10-CM

## 2018-10-28 MED ORDER — CYCLOBENZAPRINE HCL 10 MG PO TABS: 10.0000 mg | ORAL_TABLET | Freq: Three times a day (TID) | ORAL | 0 refills | Status: DC | PRN

## 2018-10-28 MED ORDER — FAMOTIDINE 20 MG PO TABS: 20.0000 mg | ORAL_TABLET | Freq: Two times a day (BID) | ORAL | 3 refills | Status: DC | PRN

## 2018-10-28 NOTE — Patient Instructions (Addendum)
Labs for fatigue and swelling and blood pressure today  Also checking BNP (heart failure test)   Take an extra torsemide this weekend   If you develop chest pain or shortness of breath that persists please go to the emergency room   We will have a plan when labs return

## 2018-10-28 NOTE — Progress Notes (Signed)
Subjective:    Patient ID: Rachael Russell, female    DOB: [birth year], 53 y.o.   MRN: [id number]  HPI  Here for swelling  and chest pressure  Also fatigue   Wt Readings from Last 3 Encounters:  10/28/18 187 lb 4 oz (84.9 kg)  08/08/18 183 lb 8 oz (83.2 kg)  07/25/18 183 lb (83 kg)   31.16 kg/m   Back in February noted bp up  153/110 was her highest reading Tried to eat better   Also yoga and protein shakes (? 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  Back is doing much better  The prednisone helped (December)  Needs to have flexeril now  Wants to get back to the elliptical   Then noted her hands were more swollen in am (esp R hand)  Now she feels swollen all over  Does not feel like she has as much urine output   bp is stable today  No cp or palpitations or headaches or edema  No side effects to medicines  BP Readings from Last 3 Encounters:  10/28/18 (!) 146/84  08/08/18 112/68  07/25/18 137/83    Amlodipine 5 Lisinopril 40 demadex 5   Was told years ago she had "fluid around her heart"  Did not see cardiology  Was put on torsemide Had cardiac testing done   Nipple discharge on right  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 Mammogram was in august     Lab Results  Component Value Date   CREATININE 1.11 (H) 04/28/2018   BUN 13 04/28/2018   NA 136 04/28/2018   K 4.0 04/28/2018   CL 98 04/28/2018   CO2 23 04/28/2018   She had sob when sleeping last night  Got up and her chest felt heavy- sat up and used rest room  and she felt better  No reflux or heartburn symptoms   Needs replacement of zantac  Acid reflux from time to time   EKG today NSR with rate of 69   Mentions on the way out the door that she has some R sided nipple discharge  (hx of ductal papillomatosis of L breast in the past) It is gold in color and clear  Patient Active Problem List   Diagnosis Date Noted  . Nipple discharge 10/28/2018  . Low back pain 08/08/2018  . Genetic testing 12/01/2017  . Family history of breast cancer   . Family history of prostate cancer   . Papilloma of left breast 07/09/2017  . Pedal edema 02/23/2017  . Heavy menses 04/28/2016  . Vitamin D deficiency 04/26/2015  . Fatigue 11/06/2014  . Hemorrhoids, external 10/13/2013  . Cervical disc disorder with radiculopathy of cervical region 09/15/2013  . History of herpes zoster 03/07/2013  . 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 Routine general medical examination at a health care facility 05/11/2011  . Allergic rhinitis 12/19/2010  . Fibrocystic  breast disease 12/19/2010  . GERD 04/14/2010  . SINUSITIS, CHRONIC FRONTAL 04/22/2007  . Hyperlipidemia 12/21/2006  . Essential hypertension 12/21/2006   Past Medical History:  Diagnosis Date  . Anemia   . Anxiety   . Family history of adverse reaction to anesthesia    mother has difficult intubation  . Family history of breast cancer   . Family history of prostate cancer   . Fibroadenoma of breast, left   . Fibrocystic breast, right   . GERD (gastroesophageal reflux disease)   . Hyperlipidemia   . Hypertension   . Irritable bowel syndrome (IBS)   . Menorrhagia   . Papilloma of left breast 07/09/2017  . Wears glasses    Past Surgical History:  Procedure Laterality Date  . BREAST DUCTAL SYSTEM EXCISION Left 11/18/2012   Procedure: EXCISION DUCTAL SYSTEM BREAST;  Surgeon: [name], MD;  Location: [facility];  Service: General;  Laterality: Left;  . BREAST LUMPECTOMY  WITH NEEDLE LOCALIZATION Left 11/18/2012   Procedure: excise ductal system left breast. subarealar. left partial mastectomy with radiographic guidance;  Surgeon: [name], MD;  Location: [facility];  Service: General;  Laterality: Left;  excise ductal system left breast. subarealar. left partial mastectomy with radiographic guidance  . BREAST LUMPECTOMY WITH NEEDLE LOCALIZATION Left 03/27/2015   Procedure: LEFT BREAST LUMPECTOMY  WITH NEEDLE LOCALIZATION TIMES TWO;  Surgeon: Fanny Skates, MD;  Location: [facility];  Service: General;  Laterality: Left;  . BREAST LUMPECTOMY WITH RADIOACTIVE SEED LOCALIZATION Left 07/09/2017   Procedure: LEFT BREAST LUMPECTOMY WITH RADIOACTIVE SEED LOCALIZATION;  Surgeon: Fanny Skates, MD;  Location: [facility];  Service: General;  Laterality: Left;  ERAS PATHWAY  . CESAREAN SECTION  [id number]  . DILATION AND CURETTAGE OF UTERUS  1998  . DILITATION & CURRETTAGE/HYSTROSCOPY WITH NOVASURE ABLATION N/A 08/05/2016   Procedure: DILATATION & CURETTAGE/HYSTEROSCOPY WITH NOVASURE ABLATION;  Surgeon: Princess Bruins, MD;  Location: [facility];  Service: Gynecology;  Laterality: N/A;  . TONSILLECTOMY  age 28  . UMBILICAL HERNIA REPAIR  2006   Social History   Tobacco Use  . Smoking status: Never Smoker  . Smokeless tobacco: Never Used  Substance Use Topics  . Alcohol use: Yes    Comment: occasional  . Drug use: No   Family History  Problem Relation Age of Onset  . Hypertension Mother   . Arthritis Mother        osteoarthritis  . Breast cancer Mother 71  . Hypertension Father   . Arthritis Father        osteoarthritis  . Breast cancer Maternal Grandmother 62       bilateral breast cancer  . Breast cancer Paternal Aunt 78       bilateral breast cancer  . Prostate cancer Paternal Uncle   . Tuberculosis Paternal Grandmother   . Prostate cancer Paternal Uncle    Allergies  Allergen Reactions  . Ferrous  Sulfate     constipation  . Penicillins Other (See Comments)    Unknown childhood reaction  . Prilosec [Omeprazole Magnesium]     Dizziness   . Chlorhexidine Gluconate Rash   Current Outpatient Medications on File Prior to Visit  Medication Sig Dispense Refill  . amLODipine (NORVASC) 5 MG tablet Take 1 tablet (5 mg total) by mouth daily. 90 tablet 3  . atorvastatin (LIPITOR) 10 MG tablet Take 1 tablet (10 mg total) by mouth daily. 30 tablet 10  . Cholecalciferol  2000 units TABS Take 1 tablet by mouth daily.    . ferrous sulfate 325 (65 FE) MG tablet Take 325 mg by mouth daily with breakfast.    . lisinopril (PRINIVIL,ZESTRIL) 40 MG tablet Take 1 tablet (40 mg total) by mouth daily. 90 tablet 3  . torsemide (DEMADEX) 20 MG tablet Take 1 tablet (20 mg total) by mouth daily. 90 tablet 3   No current facility-administered medications on file prior to visit.      Review of Systems  Constitutional: Positive for fatigue. Negative for activity change, appetite change, chills, fever and unexpected weight change.  HENT: Negative for congestion, ear pain, rhinorrhea, sinus pressure and sore throat.   Eyes: Negative for pain, redness and visual disturbance.  Respiratory: Positive for chest tightness. Negative for cough, shortness of breath and wheezing.   Cardiovascular: Positive for leg swelling. Negative for chest pain and palpitations.  Gastrointestinal: Negative for abdominal pain, blood in stool, constipation and diarrhea.  Endocrine: Negative for polydipsia and polyuria.  Genitourinary: Positive for decreased urine volume. Negative for difficulty urinating, dysuria, frequency and urgency.  Musculoskeletal: Positive for arthralgias and back pain. Negative for myalgias.       Back pain is improved   Skin: Negative for pallor and rash.  Allergic/Immunologic: Negative for environmental allergies.  Neurological: Negative for dizziness, syncope, light-headedness and headaches.  Hematological:  Negative for adenopathy. Does not bruise/bleed easily.  Psychiatric/Behavioral: Positive for decreased concentration, dysphoric mood and sleep disturbance. Negative for suicidal ideas. The patient is nervous/anxious.        Objective:   Physical Exam Constitutional:      General: She is not in acute distress.    Appearance: Normal appearance. She is well-developed. She is obese. She is not ill-appearing or diaphoretic.  HENT:     Head: Normocephalic and atraumatic.     Mouth/Throat:     Mouth: Mucous membranes are moist.     Pharynx: Oropharynx is clear.  Eyes:     General: No scleral icterus.    Conjunctiva/sclera: Conjunctivae normal.     Pupils: Pupils are equal, round, and reactive to light.  Neck:     Musculoskeletal: Normal range of motion and neck supple.     Thyroid: No thyromegaly.     Vascular: No carotid bruit or JVD.  Cardiovascular:     Rate and Rhythm: Normal rate and regular rhythm.     Pulses: Normal pulses.     Heart sounds: Normal heart sounds. No gallop.   Pulmonary:     Effort: Pulmonary effort is normal. No respiratory distress.     Breath sounds: Normal breath sounds. No wheezing or rales.  Abdominal:     General: Bowel sounds are normal. There is no distension or abdominal bruit.     Palpations: Abdomen is soft. There is no mass.     Tenderness: There is no abdominal tenderness. There is no right CVA tenderness or left CVA tenderness.  Genitourinary:    Comments: Clear yellow nipple d/c can be expressed on the right  Musculoskeletal:     Right lower leg: Edema present.     Left lower leg: Edema present.     Comments: Trace to one plus pitting pedal edema in ankles   Lymphadenopathy:     Cervical: No cervical adenopathy.  Skin:    General: Skin is warm and dry.     Capillary Refill: Capillary refill takes less than 2 seconds.     Findings: No rash.  Neurological:  General: No focal deficit present.     Mental Status: She is alert.     Deep  Tendon Reflexes: Reflexes are normal and symmetric.  Psychiatric:        Mood and Affect: Mood normal.        Speech: Speech normal.        Behavior: Behavior normal.        Cognition and Memory: Cognition normal.     Comments: Pleasant Frustrated with her current symptoms            Assessment & Plan:   Problem List Items Addressed This Visit      Cardiovascular and Mediastinum   Essential hypertension    bp has been going up  BP: 140/90  Also fatigue/hormonal symptoms and fluid retention Labs today  She takes torsemide Lisinopril 40  Amlodipine 5 (apprehensive to inc due to her edema)      Relevant Orders   Hepatic function panel (Completed)   Renal function panel (Completed)     Other   Fatigue    I do wonder if there is a hormonal factor  (menopausal and now breast tenderness and bloating and vaginal spotting and nipple d/c on R) Also pedal edema  Labs today for edema and fatigue May need f/u with gyn also       Relevant Orders   TSH (Completed)   Pedal edema - Primary    Worse per pt  Feels "swollen all over" Also some hormonal symptoms despite being menopausal  No h/o CHF but puzzling w/u years ago at [facility] (we do not have records)  Labs today incl BNP Low threshold for 2D echo or cardiology visit (also occ sob /had chest pressure when lying down)       Relevant Orders   Brain natriuretic peptide (Completed)   Hepatic function panel (Completed)   Renal function panel (Completed)   TSH (Completed)   Nipple discharge    On the R with h/o papillomatosis in other breast  Golden/clear       Other Visit Diagnoses    Chest pressure       Relevant Orders   EKG 12-Lead (Completed)

## 2018-10-28 NOTE — Telephone Encounter (Signed)
Pt said has had swelling on and off but lately when wakes up her rt hand is swollen; this morning pt feels like retaining fluid all over body. Pt is trying to drink 80 oz of water daily but does not feel like getting fluid out of body even though pt is taking her fluid pill. Lately BP ranging 150/100 - 150/110. Today BP is 135/93. No CP but on and off SOB and last night woke up at 4:30 AM with heavy feeling in chest. No CP,heaviness in chest or SOB now. Pt wanted to see Dr Glori Bickers only; offered pt earlier appt with another provided but declined by pt. If pt condition changes or worsens prior to appt pt will go to ED. 30' appt scheduled with Dr Glori Bickers 10/28/18 at 4:15. FYI to Dr Glori Bickers and [place] CMA.

## 2018-10-29 LAB — RENAL FUNCTION PANEL
Albumin: 4.5 g/dL (ref 3.8–4.9)
BUN/Creatinine Ratio: 14 (ref 9–23)
BUN: 12 mg/dL (ref 6–24)
CO2: 22 mmol/L (ref 20–29)
Calcium: 9 mg/dL (ref 8.7–10.2)
Chloride: 102 mmol/L (ref 96–106)
Creatinine, Ser: 0.87 mg/dL (ref 0.57–1.00)
GFR calc Af Amer: 88 mL/min/{1.73_m2} (ref >59)
GFR calc non Af Amer: 76 mL/min/{1.73_m2} (ref >59)
Glucose: 85 mg/dL (ref 65–99)
Phosphorus: 3.6 mg/dL (ref 3.0–4.3)
Potassium: 3.7 mmol/L (ref 3.5–5.2)
Sodium: 138 mmol/L (ref 134–144)

## 2018-10-29 LAB — BRAIN NATRIURETIC PEPTIDE: BNP: 30.6 pg/mL (ref 0.0–100.0)

## 2018-10-29 LAB — HEPATIC FUNCTION PANEL
ALT: 13 IU/L (ref 0–32)
AST: 15 IU/L (ref 0–40)
Alkaline Phosphatase: 68 IU/L (ref 39–117)
Bilirubin Total: 0.3 mg/dL (ref 0.0–1.2)
Bilirubin, Direct: 0.1 mg/dL (ref 0.00–0.40)
Total Protein: 6.6 g/dL (ref 6.0–8.5)

## 2018-10-29 LAB — TSH: TSH: 2.22 u[IU]/mL (ref 0.450–4.500)

## 2018-10-30 NOTE — Assessment & Plan Note (Signed)
I do wonder if there is a hormonal factor  (menopausal and now breast tenderness and bloating and vaginal spotting and nipple d/c on R) Also pedal edema  Labs today for edema and fatigue May need f/u with gyn also

## 2018-10-30 NOTE — Assessment & Plan Note (Signed)
Worse per pt  Feels "swollen all over" Also some hormonal symptoms despite being menopausal  No h/o CHF but puzzling w/u years ago at [facility] (we do not have records)  Labs today incl BNP Low threshold for 2D echo or cardiology visit (also occ sob /had chest pressure when lying down)

## 2018-10-30 NOTE — Assessment & Plan Note (Signed)
On the R with h/o papillomatosis in other breast  Golden/clear

## 2018-10-30 NOTE — Assessment & Plan Note (Signed)
bp has been going up  BP: 140/90  Also fatigue/hormonal symptoms and fluid retention Labs today  She takes torsemide Lisinopril 40  Amlodipine 5 (apprehensive to inc due to her edema)

## 2018-10-31 ENCOUNTER — Telehealth: Payer: Self-pay | Admitting: Family Medicine

## 2018-10-31 DIAGNOSIS — R0600 Dyspnea, unspecified: Secondary | ICD-10-CM | POA: Insufficient documentation

## 2018-10-31 DIAGNOSIS — R0602 Shortness of breath: Secondary | ICD-10-CM

## 2018-10-31 DIAGNOSIS — R600 Localized edema: Secondary | ICD-10-CM

## 2018-10-31 MED ORDER — HYDROCHLOROTHIAZIDE 25 MG PO TABS: 25.0000 mg | ORAL_TABLET | Freq: Every day | ORAL | 3 refills | Status: DC

## 2018-10-31 NOTE — Telephone Encounter (Signed)
I sent in the HCTZ 25 mg once daily  Let me know how it works in a few days  Take it instead of torsemide  Thanks  I ordered future labs [facility] for lab draw (release them if needed)  I also placed ref for echocardiogram and will forward to [facility]

## 2018-10-31 NOTE — Telephone Encounter (Signed)
-----   Message from Tammi Sou, [place] sent at 10/31/2018  4:37 PM EDT ----- Pt notified of DR. Pressley Barsky's recommendations and verbalized understanding. Pt does want to try the HCTZ please send it to the [facility] in [place], pt agrees with 2D echo, please put order in and I advised pt our [facility] will call to schedule appt.,   Also pt is a [employer] employee so she is requesting you put the lab orders in and she will get them done at work

## 2018-10-31 NOTE — Addendum Note (Signed)
Addended by: Ellamae Sia on: 10/31/2018 05:36 PM   Modules accepted: Orders

## 2018-11-02 ENCOUNTER — Telehealth: Payer: Self-pay | Admitting: Family Medicine

## 2018-11-02 NOTE — Telephone Encounter (Signed)
Called patient to schedule the 2DEcho , she called me back and left message that she wants to discuss the 2D Echo with Dr Glori Bickers before scheduling the test

## 2018-11-02 NOTE — Telephone Encounter (Signed)
How is she feeling? How is the swelling?   Any new chest pain or shortness of breath?

## 2018-11-02 NOTE — Telephone Encounter (Signed)
Pt left v/m since EKG and labs were normal does pt have to have the echo now; pt cannot really afford the echo now and pt request cb to see if an wait on 2D echo.Please advise.

## 2018-11-02 NOTE — Telephone Encounter (Signed)
That sounds re assuring (if symptoms are improved) -I am ok to cancel the echo Please let me know if worse or no further improvement   (I hope the hctz helps)  Get labs as planned  If anything changes I will ref to cardiology  Please cancel the echo

## 2018-11-02 NOTE — Telephone Encounter (Signed)
Pt said overall she feels fine she has no SOB, and no Chest pain. Pt said the overall swelling is down completely she is back to her baseline weight. The only swelling she has is right hand and ankle swelling. Pt said that she can't afford the imaging because it's $1500 out of pocket unless it's done as an in office procedure

## 2018-11-03 NOTE — Telephone Encounter (Signed)
Pt notified of Dr. Tower's comments and verbalized understanding  

## 2018-11-03 NOTE — Telephone Encounter (Signed)
Left VM requesting pt to call the office back 

## 2018-11-03 NOTE — Addendum Note (Signed)
Addended by: Tammi Sou on: 11/03/2018 02:28 PM   Modules accepted: Orders

## 2018-11-10 LAB — BASIC METABOLIC PANEL
BUN/Creatinine Ratio: 15 (ref 9–23)
BUN: 16 mg/dL (ref 6–24)
CO2: 24 mmol/L (ref 20–29)
Calcium: 9.6 mg/dL (ref 8.7–10.2)
Chloride: 102 mmol/L (ref 96–106)
Creatinine, Ser: 1.05 mg/dL — ABNORMAL HIGH (ref 0.57–1.00)
GFR calc Af Amer: 70 mL/min/{1.73_m2} (ref >59)
GFR calc non Af Amer: 61 mL/min/{1.73_m2} (ref >59)
Glucose: 79 mg/dL (ref 65–99)
Potassium: 4.1 mmol/L (ref 3.5–5.2)
Sodium: 140 mmol/L (ref 134–144)

## 2018-11-14 ENCOUNTER — Other Ambulatory Visit: Payer: Self-pay | Admitting: *Deleted

## 2018-11-14 MED ORDER — FAMOTIDINE 20 MG PO TABS: 20.0000 mg | ORAL_TABLET | Freq: Two times a day (BID) | ORAL | 0 refills | Status: DC | PRN

## 2018-11-14 NOTE — Telephone Encounter (Signed)
Rx sent to mail order pharmacy  

## 2018-11-23 ENCOUNTER — Telehealth: Payer: Self-pay | Admitting: *Deleted

## 2018-11-23 NOTE — Telephone Encounter (Signed)
Called patient to inform her that Dr Kennon Rounds said to watch at this time and if it gets worse to call us back and we  can set up a virtual visit , but this is most likely related to menopause. Pt verbalizes and agrees with plan.

## 2018-11-23 NOTE — Telephone Encounter (Signed)
Pt left message about her cycle, called patient back. Pt states sh hasn't had a period since September and has had frequent spotting for the last 2 weeks and it started off red blood than has turned into darker brown with stringy like clots in it. Sent message to provider, will call patient back.

## 2018-12-07 NOTE — Telephone Encounter (Signed)
Pt seen 10/28/18.

## 2019-02-13 ENCOUNTER — Telehealth: Payer: Self-pay | Admitting: Family Medicine

## 2019-02-13 NOTE — Telephone Encounter (Signed)
Best number [phone] Pt would like to get a copy of low sodium diet.  Can you put this on my chart

## 2019-02-13 NOTE — Telephone Encounter (Signed)
I like the DASH diet- and often use epic to print this out but I need to be in a progress note to do it .  (I usually print with AVS)   If you can do that without being in a note please do   (also another provider may be able to print the [place]) Otherwise I will do it when I return  (please remind me)  Thanks

## 2019-02-14 NOTE — Telephone Encounter (Signed)
Went to references and printed out the DASH eating plan Dr. Glori Bickers referred to in her prev message and mailed it to pt since I can't upload on mychart

## 2019-03-17 ENCOUNTER — Telehealth: Payer: Self-pay | Admitting: Family Medicine

## 2019-03-17 MED ORDER — FAMOTIDINE 20 MG PO TABS: 20.0000 mg | ORAL_TABLET | Freq: Two times a day (BID) | ORAL | 0 refills | Status: DC | PRN

## 2019-03-17 MED ORDER — HYDROCHLOROTHIAZIDE 25 MG PO TABS: 25.0000 mg | ORAL_TABLET | Freq: Every day | ORAL | 0 refills | Status: DC

## 2019-03-17 NOTE — Telephone Encounter (Signed)
Rx sent 

## 2019-03-17 NOTE — Telephone Encounter (Signed)
Patient needs prescriptions sent in to another pharmacy . She stated that [facility] did not have them in stock    Famotidine 20 mg Hydrochlorothiazide 25mg     [place]   Patient's C/B # [id number]

## 2019-03-31 ENCOUNTER — Other Ambulatory Visit: Payer: Self-pay | Admitting: Family Medicine

## 2019-04-21 ENCOUNTER — Telehealth: Payer: Self-pay

## 2019-04-21 DIAGNOSIS — Z0000 Encounter for general adult medical examination without abnormal findings: Secondary | ICD-10-CM

## 2019-04-21 DIAGNOSIS — E559 Vitamin D deficiency, unspecified: Secondary | ICD-10-CM

## 2019-04-21 DIAGNOSIS — E7800 Pure hypercholesterolemia, unspecified: Secondary | ICD-10-CM

## 2019-04-21 DIAGNOSIS — R5382 Chronic fatigue, unspecified: Secondary | ICD-10-CM

## 2019-04-21 DIAGNOSIS — I10 Essential (primary) hypertension: Secondary | ICD-10-CM

## 2019-04-21 DIAGNOSIS — Z1231 Encounter for screening mammogram for malignant neoplasm of breast: Secondary | ICD-10-CM

## 2019-04-21 NOTE — Telephone Encounter (Signed)
Pt called to cancel lab appt on 04/28/19; pts husband is [employer] employee and said pt needs written lab orders to take to [facility] and the labs will be no cost to pt. Pt request CPX labs, Vit D, and covid test; pt not having any symptoms and no exposure but [facility] will do free covid testing for employees and their spouse but needs doctors order. Pt request cb when written lab orders ready for pick up.pt also needs order for 3D diagnostic bilateral mammogram to [facility] mammography in [place]. Last mammogram 04/01/2018. Pt has CPX scheduled with Dr Glori Bickers on 05/05/19.

## 2019-04-25 DIAGNOSIS — Z1231 Encounter for screening mammogram for malignant neoplasm of breast: Secondary | ICD-10-CM | POA: Insufficient documentation

## 2019-04-25 NOTE — Telephone Encounter (Signed)
I ordered mammogram- please direct to whoever is making appts  I ordered her [facility] tests for future lab collect  The covid test may get deleted since they tend do do that -warn her of this (not sure if they will because it is not for our drawing site) Please print out orders/release for her Thanks

## 2019-04-26 NOTE — Telephone Encounter (Signed)
Pt notified orders are done and if she wants to pick them up she can but they are also in [facility]'s system. Also pt advised 3D mammogram order done and she can call directly to schedule mammogram.

## 2019-04-27 ENCOUNTER — Telehealth: Payer: Self-pay | Admitting: Family Medicine

## 2019-04-27 DIAGNOSIS — E559 Vitamin D deficiency, unspecified: Secondary | ICD-10-CM

## 2019-04-27 DIAGNOSIS — Z0000 Encounter for general adult medical examination without abnormal findings: Secondary | ICD-10-CM

## 2019-04-27 DIAGNOSIS — E7800 Pure hypercholesterolemia, unspecified: Secondary | ICD-10-CM

## 2019-04-27 DIAGNOSIS — I10 Essential (primary) hypertension: Secondary | ICD-10-CM

## 2019-04-27 NOTE — Telephone Encounter (Signed)
There are several antibody tests to choose some (I don't order it because we don't know if results are significant in terms of resistance, etc)  Does she know which panel they do  Perhaps she can just ask and they will not need an order?

## 2019-04-27 NOTE — Telephone Encounter (Signed)
yes

## 2019-04-27 NOTE — Telephone Encounter (Signed)
Robin faxed the order to [facility] and I called and advised pt she can call them to schedule mammogram, but I did advise her to give them an hr or so to make sure the received the order

## 2019-04-27 NOTE — Telephone Encounter (Signed)
Pt said thinks her mammography facility is not in [facility] network and request written order for mammography./ pt request cb.

## 2019-04-27 NOTE — Telephone Encounter (Signed)
Pt left v/m and pt wants blood test instead of nasal test and request the order to be resent. Please advise.

## 2019-04-27 NOTE — Telephone Encounter (Signed)
-----   Message from Cloyd Stagers, RT sent at 04/18/2019  9:59 AM EDT ----- Regarding: Lab Orders for Friday 9.4.2020 Please place lab orders for Friday 9.4.2020, office visit for physical on Friday 9.11.2020 Thank you, Dyke Maes RT(R)

## 2019-04-27 NOTE — Telephone Encounter (Signed)
Does she mean, the antibody test?

## 2019-04-28 ENCOUNTER — Other Ambulatory Visit: Payer: Managed Care, Other (non HMO)

## 2019-04-28 LAB — CBC WITH DIFFERENTIAL/PLATELET
Basophils Absolute: 0.1 10*3/uL (ref 0.0–0.2)
Basos: 1 %
EOS (ABSOLUTE): 0.1 10*3/uL (ref 0.0–0.4)
Eos: 1 %
Hematocrit: 38.8 % (ref 34.0–46.6)
Hemoglobin: 13.2 g/dL (ref 11.1–15.9)
Immature Grans (Abs): 0 10*3/uL (ref 0.0–0.1)
Immature Granulocytes: 0 %
Lymphocytes Absolute: 2.5 10*3/uL (ref 0.7–3.1)
Lymphs: 27 %
MCH: 31 pg (ref 26.6–33.0)
MCHC: 34 g/dL (ref 31.5–35.7)
MCV: 91 fL (ref 79–97)
Monocytes Absolute: 0.7 10*3/uL (ref 0.1–0.9)
Monocytes: 8 %
Neutrophils Absolute: 6.2 10*3/uL (ref 1.4–7.0)
Neutrophils: 63 %
Platelets: 336 10*3/uL (ref 150–450)
RBC: 4.26 x10E6/uL (ref 3.77–5.28)
RDW: 12 % (ref 11.7–15.4)
WBC: 9.6 10*3/uL (ref 3.4–10.8)

## 2019-04-28 LAB — LIPID PANEL
Chol/HDL Ratio: 3.1 ratio (ref 0.0–4.4)
Cholesterol, Total: 215 mg/dL — ABNORMAL HIGH (ref 100–199)
HDL: 70 mg/dL (ref >39)
LDL Chol Calc (NIH): 133 mg/dL — ABNORMAL HIGH (ref 0–99)
Triglycerides: 66 mg/dL (ref 0–149)
VLDL Cholesterol Cal: 12 mg/dL (ref 5–40)

## 2019-04-28 LAB — COMPREHENSIVE METABOLIC PANEL
ALT: 12 IU/L (ref 0–32)
AST: 21 IU/L (ref 0–40)
Albumin/Globulin Ratio: 1.8 (ref 1.2–2.2)
Albumin: 4.4 g/dL (ref 3.8–4.9)
Alkaline Phosphatase: 56 IU/L (ref 39–117)
BUN/Creatinine Ratio: 15 (ref 9–23)
BUN: 14 mg/dL (ref 6–24)
Bilirubin Total: 0.4 mg/dL (ref 0.0–1.2)
CO2: 22 mmol/L (ref 20–29)
Calcium: 9.6 mg/dL (ref 8.7–10.2)
Chloride: 102 mmol/L (ref 96–106)
Creatinine, Ser: 0.93 mg/dL (ref 0.57–1.00)
GFR calc Af Amer: 81 mL/min/{1.73_m2} (ref >59)
GFR calc non Af Amer: 70 mL/min/{1.73_m2} (ref >59)
Globulin, Total: 2.5 g/dL (ref 1.5–4.5)
Glucose: 82 mg/dL (ref 65–99)
Potassium: 3.9 mmol/L (ref 3.5–5.2)
Sodium: 138 mmol/L (ref 134–144)
Total Protein: 6.9 g/dL (ref 6.0–8.5)

## 2019-04-28 LAB — TSH: TSH: 1.65 u[IU]/mL (ref 0.450–4.500)

## 2019-04-28 LAB — VITAMIN D 25 HYDROXY (VIT D DEFICIENCY, FRACTURES): Vit D, 25-Hydroxy: 44 ng/mL (ref 30.0–100.0)

## 2019-04-28 NOTE — Telephone Encounter (Signed)
Pt called to ck on status of ordering the antibody test for covid; pt notified as instructed and pt will ck with [facility] to find out which panel [facility] does and pt will cb with info.

## 2019-05-02 ENCOUNTER — Telehealth: Payer: Self-pay | Admitting: *Deleted

## 2019-05-02 NOTE — Telephone Encounter (Signed)
I called and added order number, [id number], sars antibody IGG

## 2019-05-02 NOTE — Telephone Encounter (Signed)
Patient left a voicemail stating that she needed to call today with the information for Dr. Glori Bickers to order the covid antibody test for her.. Rachael Russell stated that the code that needs to be sent to [facility] is [phone]. Patient stated that her husband works for [employer].

## 2019-05-02 NOTE — Telephone Encounter (Signed)
I tried to plug that in  Will send this to Karna Christmas also  ? Is some sort of covid ab test but I'm not sure which one and pt does not know the name of it?

## 2019-05-03 LAB — SPECIMEN STATUS REPORT

## 2019-05-03 LAB — SAR COV2 SEROLOGY (COVID19)AB(IGG),IA: SARS-CoV-2 Ab, IgG: NEGATIVE

## 2019-05-05 ENCOUNTER — Other Ambulatory Visit: Payer: Self-pay

## 2019-05-05 ENCOUNTER — Encounter: Payer: Self-pay | Admitting: Family Medicine

## 2019-05-05 ENCOUNTER — Ambulatory Visit (INDEPENDENT_AMBULATORY_CARE_PROVIDER_SITE_OTHER): Payer: Managed Care, Other (non HMO) | Admitting: Family Medicine

## 2019-05-05 VITALS — BP 122/74 | HR 75 | Temp 97.4°F | Ht 65.25 in | Wt 189.4 lb

## 2019-05-05 DIAGNOSIS — E7800 Pure hypercholesterolemia, unspecified: Secondary | ICD-10-CM

## 2019-05-05 DIAGNOSIS — I10 Essential (primary) hypertension: Secondary | ICD-10-CM

## 2019-05-05 DIAGNOSIS — E559 Vitamin D deficiency, unspecified: Secondary | ICD-10-CM

## 2019-05-05 DIAGNOSIS — Z0000 Encounter for general adult medical examination without abnormal findings: Secondary | ICD-10-CM

## 2019-05-05 DIAGNOSIS — Z23 Encounter for immunization: Secondary | ICD-10-CM

## 2019-05-05 MED ORDER — ATORVASTATIN CALCIUM 10 MG PO TABS: 10.0000 mg | ORAL_TABLET | Freq: Every day | ORAL | 3 refills | Status: DC

## 2019-05-05 MED ORDER — FAMOTIDINE 20 MG PO TABS: 20.0000 mg | ORAL_TABLET | Freq: Two times a day (BID) | ORAL | 3 refills | Status: DC | PRN

## 2019-05-05 MED ORDER — HYDROCHLOROTHIAZIDE 25 MG PO TABS: 25.0000 mg | ORAL_TABLET | Freq: Every day | ORAL | 3 refills | Status: DC

## 2019-05-05 MED ORDER — CYCLOBENZAPRINE HCL 10 MG PO TABS: 10.0000 mg | ORAL_TABLET | Freq: Three times a day (TID) | ORAL | 1 refills | Status: DC | PRN

## 2019-05-05 MED ORDER — LISINOPRIL 40 MG PO TABS: 40.0000 mg | ORAL_TABLET | Freq: Every day | ORAL | 3 refills | Status: DC

## 2019-05-05 MED ORDER — AMLODIPINE BESYLATE 5 MG PO TABS: 5.0000 mg | ORAL_TABLET | Freq: Every day | ORAL | 3 refills | Status: DC

## 2019-05-05 NOTE — Progress Notes (Signed)
Subjective:    Patient ID: Rachael Russell, female    DOB: 05/18/1966, 53 y.o.   MRN: [id number]  HPI Here for health maintenance exam and to review chronic medical problems   She is doing ok overall   Wt Readings from Last 3 Encounters:  05/05/19 189 lb 7 oz (85.9 kg)  10/28/18 187 lb 4 oz (84.9 kg)  08/08/18 183 lb 8 oz (83.2 kg)  trying to exercise - has some issues with R low back pain with radiculopathy  Stopped PT early- doing exercises at home  31.28 kg/m    Flu vaccine -today   Mammogram 8/19 -has it ordered /will call to schedule  Self breast exam -hard to tell, has dense tissue with h/o nipple d/c (none lately) H/o ductal papillomatosis of the breast  Had reassuring genetic testing (no BRACA)   Tetanus shot 8/10  Wants to do that   Pap 12/19 -neg with neg HPV Still having regular periods  LMP 8/19 (it had stopped for a while)  Pretty light   Colonoscopy 2/18   bp in fair control at this time  BP Readings from Last 1 Encounters:  05/05/19 122/74   No changes needed Most recent labs reviewed  Disc lifstyle change with low sodium diet and exercise   Lab Results  Component Value Date   CREATININE 0.93 04/27/2019   BUN 14 04/27/2019   NA 138 04/27/2019   K 3.9 04/27/2019   CL 102 04/27/2019   CO2 22 04/27/2019   Lab Results  Component Value Date   ALT 12 04/27/2019   AST 21 04/27/2019   ALKPHOS 56 04/27/2019   BILITOT 0.4 04/27/2019   Lab Results  Component Value Date   TSH 1.650 04/27/2019   Lab Results  Component Value Date   WBC 9.6 04/27/2019   HGB 13.2 04/27/2019   HCT 38.8 04/27/2019   MCV 91 04/27/2019   PLT 336 04/27/2019     Hyperlipidemia Lab Results  Component Value Date   CHOL 215 (H) 04/27/2019   CHOL 211 (H) 06/17/2018   CHOL 273 (H) 04/28/2018   Lab Results  Component Value Date   HDL 70 04/27/2019   HDL 70 06/17/2018   HDL 75 04/28/2018   Lab Results  Component Value Date   LDLCALC 131 (H) 06/17/2018   LDLCALC 186 (H) 04/28/2018   LDLCALC 166 (H) 02/26/2017   Lab Results  Component Value Date   TRIG 66 04/27/2019   TRIG 52 06/17/2018   TRIG 60 04/28/2018   Lab Results  Component Value Date   CHOLHDL 3.1 04/27/2019   CHOLHDL 3.0 06/17/2018   CHOLHDL 3.6 04/28/2018   Lab Results  Component Value Date   LDLDIRECT 149.9 05/11/2011   LDLDIRECT 123.2 01/03/2007  atorvastatin and diet  LDL 131   H/o D def Level 44.0   Patient Active Problem List   Diagnosis Date Noted  . Screening mammogram, encounter for 04/25/2019  . Dyspnea 10/31/2018  . Nipple discharge 10/28/2018  . Low back pain 08/08/2018  . Genetic testing 12/01/2017  . Family history of breast cancer   . Family history of prostate cancer   . Papilloma of left breast 07/09/2017  . Pedal edema 02/23/2017  . Heavy menses 04/28/2016  . Vitamin D deficiency 04/26/2015  . Fatigue 11/06/2014  . Hemorrhoids, external 10/13/2013  . Cervical disc disorder with radiculopathy of cervical region 09/15/2013  . History of herpes zoster 03/07/2013  . Ductal papillomatosis of breast  12/05/2012  . Routine general medical examination at a health care facility 05/11/2011  . Allergic rhinitis 12/19/2010  . Fibrocystic breast disease 12/19/2010  . GERD 04/14/2010  . SINUSITIS, CHRONIC FRONTAL 04/22/2007  . Hyperlipidemia 12/21/2006  . Essential hypertension 12/21/2006   Past Medical History:  Diagnosis Date  . Anemia   . Anxiety   . Family history of adverse reaction to anesthesia    mother has difficult intubation  . Family history of breast cancer   . Family history of prostate cancer   . Fibroadenoma of breast, left   . Fibrocystic breast, right   . GERD (gastroesophageal reflux disease)   . Hyperlipidemia   . Hypertension   . Irritable bowel syndrome (IBS)   . Menorrhagia   . Papilloma of left breast 07/09/2017  . Wears glasses    Past Surgical History:  Procedure Laterality Date  . BREAST DUCTAL SYSTEM  EXCISION Left 11/18/2012   Procedure: EXCISION DUCTAL SYSTEM BREAST;  Surgeon: [name], MD;  Location: [facility];  Service: General;  Laterality: Left;  . BREAST LUMPECTOMY WITH NEEDLE LOCALIZATION Left 11/18/2012   Procedure: excise ductal system left breast. subarealar. left partial mastectomy with radiographic guidance;  Surgeon: [name], MD;  Location: [place];  Service: General;  Laterality: Left;  excise ductal system left breast. subarealar. left partial mastectomy with radiographic guidance  . BREAST LUMPECTOMY WITH NEEDLE LOCALIZATION Left 03/27/2015   Procedure: LEFT BREAST LUMPECTOMY  WITH NEEDLE LOCALIZATION TIMES TWO;  Surgeon: Fanny Skates, MD;  Location: [place];  Service: General;  Laterality: Left;  . BREAST LUMPECTOMY WITH RADIOACTIVE SEED LOCALIZATION Left 07/09/2017   Procedure: LEFT BREAST LUMPECTOMY WITH RADIOACTIVE SEED LOCALIZATION;  Surgeon: Fanny Skates, MD;  Location: [facility];  Service: General;  Laterality: Left;  ERAS PATHWAY  . CESAREAN SECTION  [id number]  . DILATION AND CURETTAGE OF UTERUS  1998  . DILITATION & CURRETTAGE/HYSTROSCOPY WITH NOVASURE ABLATION N/A 08/05/2016   Procedure: DILATATION & CURETTAGE/HYSTEROSCOPY WITH NOVASURE ABLATION;  Surgeon: Princess Bruins, MD;  Location: [facility];  Service: Gynecology;  Laterality: N/A;  . TONSILLECTOMY  age 67  . UMBILICAL HERNIA REPAIR  2006   Social History   Tobacco Use  . Smoking status: Never Smoker  . Smokeless tobacco: Never Used  Substance Use Topics  . Alcohol use: Not Currently  . Drug use: No   Family History  Problem Relation Age of Onset  . Hypertension Mother   . Arthritis Mother        osteoarthritis  . Breast cancer Mother 65  . Hypertension Father   . Arthritis Father        osteoarthritis  . Breast cancer Maternal Grandmother 62       bilateral breast cancer  . Breast cancer Paternal Aunt 74        bilateral breast cancer  . Prostate cancer Paternal Uncle   . Tuberculosis Paternal Grandmother   . Prostate cancer Paternal Uncle    Allergies  Allergen Reactions  . Ferrous Sulfate     constipation  . Penicillins Other (See Comments)    Unknown childhood reaction  . Prilosec [Omeprazole Magnesium]     Dizziness   . Chlorhexidine Gluconate Rash   Current Outpatient Medications on File Prior to Visit  Medication Sig Dispense Refill  . Cholecalciferol 2000 units TABS Take 1 tablet by mouth daily.    . ferrous sulfate 325 (65 FE) MG tablet Take  325 mg by mouth daily with breakfast.     No current facility-administered medications on file prior to visit.      Review of Systems  Constitutional: Negative for activity change, appetite change, fatigue, fever and unexpected weight change.  HENT: Negative for congestion, ear pain, rhinorrhea, sinus pressure and sore throat.   Eyes: Negative for pain, redness and visual disturbance.  Respiratory: Negative for cough, shortness of breath and wheezing.   Cardiovascular: Negative for chest pain and palpitations.  Gastrointestinal: Negative for abdominal pain, blood in stool, constipation and diarrhea.  Endocrine: Negative for polydipsia and polyuria.  Genitourinary: Negative for dysuria, frequency and urgency.  Musculoskeletal: Positive for back pain. Negative for arthralgias and myalgias.  Skin: Negative for pallor and rash.  Allergic/Immunologic: Negative for environmental allergies.  Neurological: Negative for dizziness, syncope and headaches.  Hematological: Negative for adenopathy. Does not bruise/bleed easily.  Psychiatric/Behavioral: Negative for decreased concentration and dysphoric mood. The patient is not nervous/anxious.        Objective:   Physical Exam Constitutional:      General: She is not in acute distress.    Appearance: Normal appearance. She is well-developed. She is obese. She is not ill-appearing or diaphoretic.   HENT:     Head: Normocephalic and atraumatic.     Right Ear: Tympanic membrane, ear canal and external ear normal.     Left Ear: Tympanic membrane, ear canal and external ear normal.     Nose: Nose normal. No congestion.     Mouth/Throat:     Mouth: Mucous membranes are moist.     Pharynx: Oropharynx is clear. No posterior oropharyngeal erythema.  Eyes:     General: No scleral icterus.    Extraocular Movements: Extraocular movements intact.     Conjunctiva/sclera: Conjunctivae normal.     Pupils: Pupils are equal, round, and reactive to light.  Neck:     Musculoskeletal: Normal range of motion and neck supple. No neck rigidity or muscular tenderness.     Thyroid: No thyromegaly.     Vascular: No carotid bruit or JVD.  Cardiovascular:     Rate and Rhythm: Normal rate and regular rhythm.     Pulses: Normal pulses.     Heart sounds: Normal heart sounds. No gallop.   Pulmonary:     Effort: Pulmonary effort is normal. No respiratory distress.     Breath sounds: Normal breath sounds. No wheezing.     Comments: Good air exch Chest:     Chest wall: No tenderness.  Abdominal:     General: Bowel sounds are normal. There is no distension or abdominal bruit.     Palpations: Abdomen is soft. There is no mass.     Tenderness: There is no abdominal tenderness.     Hernia: No hernia is present.  Genitourinary:    Comments: Breast exam: No mass, nodules, thickening, tenderness, bulging, retraction, inflamation, nipple discharge or skin changes noted.  No axillary or clavicular LA.     Very dense breast tissue w/o focal mass   Musculoskeletal: Normal range of motion.        General: No tenderness.     Right lower leg: No edema.     Left lower leg: No edema.  Lymphadenopathy:     Cervical: No cervical adenopathy.  Skin:    General: Skin is warm and dry.     Coloration: Skin is not pale.     Findings: No erythema or rash.  Neurological:  Mental Status: She is alert. Mental status is at  baseline.     Cranial Nerves: No cranial nerve deficit.     Motor: No abnormal muscle tone.     Coordination: Coordination normal.     Gait: Gait normal.     Deep Tendon Reflexes: Reflexes are normal and symmetric. Reflexes normal.  Psychiatric:        Mood and Affect: Mood normal.        Cognition and Memory: Cognition and memory normal.           Assessment & Plan:   Problem List Items Addressed This Visit      Cardiovascular and Mediastinum   Essential hypertension    bp in fair control at this time  BP Readings from Last 1 Encounters:  05/05/19 122/74   No changes needed Most recent labs reviewed  Disc lifstyle change with low sodium diet and exercise        Relevant Medications   hydrochlorothiazide (HYDRODIURIL) 25 MG tablet   amLODipine (NORVASC) 5 MG tablet   lisinopril (ZESTRIL) 40 MG tablet   atorvastatin (LIPITOR) 10 MG tablet     Other   Hyperlipidemia    Well controlled with atorvastatin and diet  LDL of 131  Goal below 130 Disc goals for lipids and reasons to control them Rev last labs with pt Rev low sat fat diet in detail       Relevant Medications   hydrochlorothiazide (HYDRODIURIL) 25 MG tablet   amLODipine (NORVASC) 5 MG tablet   lisinopril (ZESTRIL) 40 MG tablet   atorvastatin (LIPITOR) 10 MG tablet   Routine general medical examination at a health care facility - Primary    Reviewed health habits including diet and exercise and skin cancer prevention Reviewed appropriate screening tests for age  Also reviewed health mt list, fam hx and immunization status , as well as social and family history   See HPI Labs reviewed Tdap and flu shots given  Disc imp of mammogram in light of papilloma and dense breasts  (stable on exam today)  Continue healthy diet and exercise       Relevant Orders   Flu Vaccine QUAD 6+ mos PF IM (Fluarix Quad PF) (Completed)   Tdap vaccine greater than or equal to 7yo IM (Completed)   Vitamin D deficiency     Vitamin D level is therapeutic with current supplementation Disc importance of this to bone and overall health Level of 44.0          Other Visit Diagnoses    Need for influenza vaccination       Relevant Orders   Flu Vaccine QUAD 6+ mos PF IM (Fluarix Quad PF) (Completed)   Need for Tdap vaccination       Relevant Orders   Tdap vaccine greater than or equal to 7yo IM (Completed)

## 2019-05-05 NOTE — Patient Instructions (Addendum)
Don't forget to schedule your mammogram  Flu shot and tetanus shot today  Take care of yourself  Keep eating healthy  Keep exercising/walking

## 2019-05-06 NOTE — Assessment & Plan Note (Signed)
Well controlled with atorvastatin and diet  LDL of 131  Goal below 130 Disc goals for lipids and reasons to control them Rev last labs with pt Rev low sat fat diet in detail

## 2019-05-06 NOTE — Assessment & Plan Note (Signed)
bp in fair control at this time  BP Readings from Last 1 Encounters:  05/05/19 122/74   No changes needed Most recent labs reviewed  Disc lifstyle change with low sodium diet and exercise

## 2019-05-06 NOTE — Assessment & Plan Note (Signed)
Vitamin D level is therapeutic with current supplementation Disc importance of this to bone and overall health Level of 44.0

## 2019-05-06 NOTE — Assessment & Plan Note (Signed)
Reviewed health habits including diet and exercise and skin cancer prevention Reviewed appropriate screening tests for age  Also reviewed health mt list, fam hx and immunization status , as well as social and family history   See HPI Labs reviewed Tdap and flu shots given  Disc imp of mammogram in light of papilloma and dense breasts  (stable on exam today)  Continue healthy diet and exercise

## 2019-06-09 ENCOUNTER — Encounter: Payer: Self-pay | Admitting: Radiology

## 2019-07-04 ENCOUNTER — Encounter: Payer: Self-pay | Admitting: Family Medicine

## 2019-09-21 ENCOUNTER — Ambulatory Visit (INDEPENDENT_AMBULATORY_CARE_PROVIDER_SITE_OTHER): Payer: Managed Care, Other (non HMO) | Admitting: Family Medicine

## 2019-09-21 ENCOUNTER — Other Ambulatory Visit: Payer: Self-pay

## 2019-09-21 ENCOUNTER — Encounter: Payer: Self-pay | Admitting: Family Medicine

## 2019-09-21 VITALS — BP 137/90 | HR 75

## 2019-09-21 DIAGNOSIS — Z01419 Encounter for gynecological examination (general) (routine) without abnormal findings: Secondary | ICD-10-CM

## 2019-09-21 NOTE — Progress Notes (Signed)
  Subjective:     Rachael Russell is a 54 y.o. female and is here for a comprehensive physical exam. The patient reports problems - no cycle since 04/2019. Having occasional night flashes. Pap was WNL with neg HPV in 07/2018.   The following portions of the patient's history were reviewed and updated as appropriate: allergies, current medications, past family history, past medical history, past social history, past surgical history and problem list.  Review of Systems Pertinent items noted in HPI and remainder of comprehensive ROS otherwise negative.   Objective:    BP 137/90   Pulse 75   LMP 04/25/2019  General appearance: alert, cooperative and appears stated age Head: Normocephalic, without obvious abnormality, atraumatic Neck: no adenopathy, supple, symmetrical, trachea midline and thyroid not enlarged, symmetric, no tenderness/mass/nodules Lungs: clear to auscultation bilaterally Breasts: normal appearance, no masses or tenderness Heart: regular rate and rhythm, S1, S2 normal, no murmur, click, rub or gallop Abdomen: soft, non-tender; bowel sounds normal; no masses,  no organomegaly Pelvic: cervix normal in appearance, external genitalia normal, no adnexal masses or tenderness, no cervical motion tenderness, uterus normal size, shape, and consistency and vagina normal without discharge Extremities: extremities normal, atraumatic, no cyanosis or edema Pulses: 2+ and symmetric Skin: Skin color, texture, turgor normal. No rashes or lesions Lymph nodes: Cervical, supraclavicular, and axillary nodes normal. Neurologic: Grossly normal    Assessment:    Healthy female exam.      Plan:   Problem List Items Addressed This Visit    None    Visit Diagnoses    Encounter for gynecological examination without abnormal finding    -  Primary     Menopausal - let me know if her symptoms are intolerable. Advised healthy diet and exercise and layers and fan at night.  Return in 1 year (on  09/20/2020).    See After Visit Summary for Counseling Recommendations

## 2019-09-21 NOTE — Progress Notes (Signed)
LAST PAP 2019-normal Mammogram 06/2019- normal  Tdap and Flu are utd

## 2019-09-21 NOTE — Patient Instructions (Signed)

## 2019-10-13 ENCOUNTER — Telehealth: Payer: Self-pay

## 2019-10-13 NOTE — Telephone Encounter (Signed)
Patient would like to know if it is ok for her to take Vitamin B3 or B12? Wanted to make sure it would not interfere with her other medications. Reason for her wanting to take this is because she has noted some hair thinning and wonders if this vitamin would help. CB [phone]

## 2019-10-13 NOTE — Telephone Encounter (Signed)
I think they should be ok to try - unsure if they will help or not.   If problems continue let me know

## 2019-10-13 NOTE — Telephone Encounter (Signed)
Pt notified of Dr. Tower's comments and verbalized understanding  

## 2019-12-15 ENCOUNTER — Encounter: Payer: Self-pay | Admitting: Genetic Counselor

## 2020-01-10 ENCOUNTER — Ambulatory Visit: Payer: Managed Care, Other (non HMO) | Admitting: Family Medicine

## 2020-01-11 ENCOUNTER — Encounter: Payer: Self-pay | Admitting: Family Medicine

## 2020-01-11 ENCOUNTER — Other Ambulatory Visit: Payer: Self-pay

## 2020-01-11 ENCOUNTER — Ambulatory Visit (INDEPENDENT_AMBULATORY_CARE_PROVIDER_SITE_OTHER)
Admission: RE | Admit: 2020-01-11 | Discharge: 2020-01-11 | Disposition: A | Discharge status: Home / Self Care | Payer: Managed Care, Other (non HMO) | Source: Ambulatory Visit | Attending: Family Medicine | Admitting: Family Medicine

## 2020-01-11 ENCOUNTER — Ambulatory Visit: Payer: Managed Care, Other (non HMO) | Admitting: Family Medicine

## 2020-01-11 VITALS — BP 140/90 | HR 81 | Temp 98.4°F | Ht 65.25 in | Wt 193.0 lb

## 2020-01-11 DIAGNOSIS — M2391 Unspecified internal derangement of right knee: Secondary | ICD-10-CM | POA: Diagnosis not present

## 2020-01-11 DIAGNOSIS — M25561 Pain in right knee: Secondary | ICD-10-CM

## 2020-01-11 NOTE — Progress Notes (Signed)
Rachael Russell T. Wretha Laris, MD, [place] at [facility] [place], [address]  Phone: [phone]  FAX: [place] - 54 y.o. female  MRN [id number]  Date of Birth: 09/27/1965  Date: 01/11/2020  PCP: Abner Greenspan, MD  Referral: Abner Greenspan, MD  Chief Complaint  Patient presents with  . Knee Pain    Right  . Fall    Last week-landing on right knee    This visit occurred during the SARS-CoV-2 public health emergency.  Safety protocols were in place, including screening questions prior to the visit, additional usage of staff PPE, and extensive cleaning of exam room while observing appropriate contact time as indicated for disinfecting solutions.   Subjective:   Rachael Russell is a 54 y.o. very pleasant female patient with Body mass index is 31.87 kg/m. who presents with the following:  Last week, the patient fell and struck her right knee, and since then she had a significant effusion.  On Monday she woke up and her knee was hurting quite a bit and was limping.  It was last week when she fell on her right knee directly with blunt trauma.  After few days she did start wrapping it and icing it quite a bit.  She also used some topical creams.  All these seem to help a little bit, and her effusion is decreased today compared to prior state of injury.  She has no prior injury of significant knee injuries, trauma, fracture or surgery.  Woke up on Monday then her knee hurt quite a bit and was limping.  Fell on her right side last week.  Monday night used some topical cream.  Wrapped it and iced it a lot.  Wrapped it up and swelling got better and then hurting again.  Review of Systems is noted in the HPI, as appropriate   Objective:   BP 140/90   Pulse 81   Temp 98.4 F (36.9 C) (Temporal)   Ht 5' 5.25" (1.657 m)   Wt 193 lb (87.5 kg)   SpO2 98%   BMI 31.87 kg/m     GEN: No acute distress; alert,appropriate. PULM: Breathing comfortably in no respiratory distress PSYCH: Normally interactive.    Right knee: Full extension and flexion to 120 degrees.  Nontender at the tibial plateau, patellar and quad tendon.  She does have some notable tenderness with manipulation of the patella, particular in the lateral aspect of the palpation.  She does have some pain medial joint line as well as to a lesser extent the lateral joint line.  ACL, PCL, MCL, and LCL are all stable.  Minimal pain with McMurray's in both directions and some modest pain with flexion pinch.  Radiology: DG Knee 4 Views W/Patella Right  Result Date: 01/11/2020 CLINICAL DATA:  History of fall EXAM: RIGHT KNEE - COMPLETE 4+ VIEW COMPARISON:  None. FINDINGS: No fracture or dislocation. Small knee effusion. Mild medial, lateral and patellofemoral degenerative changes. 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 No acute osseous abnormality Electronically Signed   By: Donavan Foil M.D.   On: 01/11/2020 20:17    The radiological images were independently reviewed by myself in the office and results were reviewed with the patient. My independent interpretation of images: Disagree with radiology in this case.  Patient does have some mild tricompartmental arthritis.  There is a very small opacity lateral in  the patella that is seen on multiple views.  There appears to be a very slight irregularity of the patellar cortical bone.  Clinically this corresponds to an area of maximal tenderness in the lateral patella and represents a very small avulsion.Electronically Signed  By: Owens Loffler, MD On: 01/11/2020 11:40 AM EDT   Assessment and Plan:     ICD-10-CM   1. Acute internal derangement of right knee  M23.91   2. Acute pain of right knee  M25.561 DG Knee 4 Views W/Patella Right   Level of Medical Decision-Making in this case is MODERATE.   Clinically and radiographically,  the patient has a very small avulsion fracture at the lateral patella.  Greatest pain at the medial joint line.  She also has some lateral joint line pain.  She certainly could have a internal derangement, meniscal bruising or some abrupt bony edema.  Continue with basic range of motion, time, ice, Tylenol, NSAIDs. Nothing needs to be done for the tiny avulsion.  Follow-up with me in 1 month.  No orders of the defined types were placed in this encounter.  There are no discontinued medications. Orders Placed This Encounter  Procedures  . DG Knee 4 Views W/Patella Right    Signed,  Frederico Hamman T. Kailene Steinhart, MD   Outpatient Encounter Medications as of 01/11/2020  Medication Sig  . amLODipine (NORVASC) 5 MG tablet Take 1 tablet (5 mg total) by mouth daily.  Marland Kitchen atorvastatin (LIPITOR) 10 MG tablet Take 1 tablet (10 mg total) by mouth daily.  . Cholecalciferol 2000 units TABS Take 1 tablet by mouth daily.  . cyclobenzaprine (FLEXERIL) 10 MG tablet Take 1 tablet (10 mg total) by mouth 3 (three) times daily as needed for muscle spasms. Caution of sedation  . famotidine (PEPCID) 20 MG tablet Take 1 tablet (20 mg total) by mouth 2 (two) times daily as needed for heartburn or indigestion.  . ferrous sulfate 325 (65 FE) MG tablet Take 325 mg by mouth daily with breakfast.  . hydrochlorothiazide (HYDRODIURIL) 25 MG tablet Take 1 tablet (25 mg total) by mouth daily.  Marland Kitchen lisinopril (ZESTRIL) 40 MG tablet Take 1 tablet (40 mg total) by mouth daily.   No facility-administered encounter medications on file as of 01/11/2020.

## 2020-04-10 ENCOUNTER — Other Ambulatory Visit: Payer: Self-pay

## 2020-04-10 ENCOUNTER — Encounter: Payer: Self-pay | Admitting: Family Medicine

## 2020-04-10 ENCOUNTER — Ambulatory Visit (INDEPENDENT_AMBULATORY_CARE_PROVIDER_SITE_OTHER): Payer: Managed Care, Other (non HMO) | Admitting: Family Medicine

## 2020-04-10 VITALS — BP 120/80 | HR 65 | Temp 97.8°F | Ht 64.5 in | Wt 195.0 lb

## 2020-04-10 DIAGNOSIS — E7800 Pure hypercholesterolemia, unspecified: Secondary | ICD-10-CM

## 2020-04-10 DIAGNOSIS — Z0000 Encounter for general adult medical examination without abnormal findings: Secondary | ICD-10-CM

## 2020-04-10 DIAGNOSIS — E559 Vitamin D deficiency, unspecified: Secondary | ICD-10-CM | POA: Diagnosis not present

## 2020-04-10 DIAGNOSIS — D242 Benign neoplasm of left breast: Secondary | ICD-10-CM

## 2020-04-10 DIAGNOSIS — I10 Essential (primary) hypertension: Secondary | ICD-10-CM | POA: Diagnosis not present

## 2020-04-10 NOTE — Assessment & Plan Note (Signed)
bp in fair control at this time  BP Readings from Last 1 Encounters:  04/10/20 120/80   No changes needed Most recent labs reviewed  Disc lifstyle change with low sodium diet and exercise

## 2020-04-10 NOTE — Assessment & Plan Note (Signed)
Due for lipid panel Disc goals for lipids and reasons to control them Rev last labs with pt Rev low sat fat diet in detail Taking atorvastatin

## 2020-04-10 NOTE — Assessment & Plan Note (Signed)
Lab today  Takes 4000 iu daily disc imp to bone and overall health  Plans to add calcium if she tolerates it

## 2020-04-10 NOTE — Patient Instructions (Addendum)
Get a flu shot in the fall   If you are interested in the shingles vaccine series (Shingrix), call your insurance or pharmacy to check on coverage and location it must be given.  If affordable - you can schedule it here or at your pharmacy depending on coverage   Consider the NOOM program for weight management   Labs today

## 2020-04-10 NOTE — Progress Notes (Signed)
Subjective:    Patient ID: Rachael Russell, female    DOB: [birth year], 54 y.o.   MRN: [id number]  This visit occurred during the SARS-CoV-2 public health emergency.  Safety protocols were in place, including screening questions prior to the visit, additional usage of staff PPE, and extensive cleaning of exam room while observing appropriate contact time as indicated for disinfecting solutions.    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 (had genetic testing also) Has a fam hx of breast cancer  Self breast exam - is difficult with her issues  No nipple d/c lately   Pap 12/19 -negative with neg HPV Did have gyn visit 1/21 No period since last sept   Colonoscopy 2/18   HTN  bp is stable today  No cp or palpitations or headaches or edema  No side effects to medicines  BP Readings from Last 3 Encounters:  04/10/20 120/80  01/11/20 140/90  09/21/19 137/90     Pulse Readings from Last 3 Encounters:  04/10/20 65  01/11/20 81  09/21/19 75   Hyperlipidemia  Due for labs Takes atorvastatin  Lab Results  Component Value Date   CHOL 215 (H) 04/27/2019   HDL 70 04/27/2019   LDLCALC 133 (H) 04/27/2019   LDLDIRECT 149.9 05/11/2011   TRIG 66 04/27/2019   CHOLHDL 3.1 04/27/2019   Vit D def =last level 44 Due for labs  Take 4000 iu daily  May add calcium    Has taken iron for anemia  No longer has period  Should be able to stop  No [place] trait   Patient Active Problem  List   Diagnosis Date Noted  . Screening mammogram, encounter for 04/25/2019  . Dyspnea 10/31/2018  . Nipple discharge 10/28/2018  . Low back pain 08/08/2018  . Genetic testing 12/01/2017  . Family history of breast cancer   . Family history of prostate cancer   . Papilloma of left breast 07/09/2017  . Pedal edema 02/23/2017  . Heavy menses 04/28/2016  . Vitamin D deficiency 04/26/2015  . Fatigue 11/06/2014  . Hemorrhoids, external 10/13/2013  . Cervical disc disorder with radiculopathy of cervical region 09/15/2013  . History of herpes zoster 03/07/2013  . 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 Routine general medical examination at a health care facility 05/11/2011  . Allergic rhinitis 12/19/2010  . Fibrocystic breast disease 12/19/2010  . GERD 04/14/2010  . SINUSITIS, CHRONIC FRONTAL 04/22/2007  . Hyperlipidemia 12/21/2006  . Essential hypertension 12/21/2006   Past Medical History:  Diagnosis Date  . Anemia   . Anxiety   . Family history of adverse reaction to anesthesia    mother has difficult intubation  . Family history of breast cancer   . Family history of prostate cancer   . Fibroadenoma of breast, left   . Fibrocystic breast, right   . GERD (gastroesophageal reflux disease)   .  Hyperlipidemia   . Hypertension   . Irritable bowel syndrome (IBS)   . Menorrhagia   . Papilloma of left breast 07/09/2017  . Wears glasses    Past Surgical History:  Procedure Laterality Date  . BREAST DUCTAL SYSTEM EXCISION Left 11/18/2012   Procedure: EXCISION DUCTAL SYSTEM BREAST;  Surgeon: [name], MD;  Location: [place];  Service: General;  Laterality: Left;  . BREAST LUMPECTOMY WITH NEEDLE LOCALIZATION Left 11/18/2012   Procedure: excise ductal system left breast. subarealar. left partial mastectomy with radiographic guidance;  Surgeon: [name], MD;  Location: [place];  Service: General;  Laterality: Left;  excise  ductal system left breast. subarealar. left partial mastectomy with radiographic guidance  . BREAST LUMPECTOMY WITH NEEDLE LOCALIZATION Left 03/27/2015   Procedure: LEFT BREAST LUMPECTOMY  WITH NEEDLE LOCALIZATION TIMES TWO;  Surgeon: Fanny Skates, MD;  Location: [place];  Service: General;  Laterality: Left;  . BREAST LUMPECTOMY WITH RADIOACTIVE SEED LOCALIZATION Left 07/09/2017   Procedure: LEFT BREAST LUMPECTOMY WITH RADIOACTIVE SEED LOCALIZATION;  Surgeon: Fanny Skates, MD;  Location: [place];  Service: General;  Laterality: Left;  ERAS PATHWAY  . CESAREAN SECTION  [id number]  . DILATION AND CURETTAGE OF UTERUS  1998  . DILITATION & CURRETTAGE/HYSTROSCOPY WITH NOVASURE ABLATION N/A 08/05/2016   Procedure: DILATATION & CURETTAGE/HYSTEROSCOPY WITH NOVASURE ABLATION;  Surgeon: Princess Bruins, MD;  Location: [facility];  Service: Gynecology;  Laterality: N/A;  . TONSILLECTOMY  age 43  . UMBILICAL HERNIA REPAIR  2006   Social History   Tobacco Use  . Smoking status: Never Smoker  . Smokeless tobacco: Never Used  Vaping Use  . Vaping Use: Never used  Substance Use Topics  . Alcohol use: Not Currently  . Drug use: No   Family History  Problem Relation Age of Onset  . Hypertension Mother   . Arthritis Mother        osteoarthritis  . Breast cancer Mother 90  . Hypertension Father   . Arthritis Father        osteoarthritis  . Breast cancer Maternal Grandmother 62       bilateral breast cancer  . Breast cancer Paternal Aunt 26       bilateral breast cancer  . Prostate cancer Paternal Uncle   . Tuberculosis Paternal Grandmother   . Prostate cancer Paternal Uncle    Allergies  Allergen Reactions  . Ferrous Sulfate     constipation  . Penicillins Other (See Comments)    Unknown childhood reaction  . Prilosec [Omeprazole Magnesium]     Dizziness   . Chlorhexidine Gluconate Rash   Current Outpatient Medications on File Prior to Visit    Medication Sig Dispense Refill  . amLODipine (NORVASC) 5 MG tablet Take 1 tablet (5 mg total) by mouth daily. 90 tablet 3  . atorvastatin (LIPITOR) 10 MG tablet Take 1 tablet (10 mg total) by mouth daily. 90 tablet 3  . Cholecalciferol 2000 units TABS Take 2 tablets by mouth daily.     . cyclobenzaprine (FLEXERIL) 10 MG tablet Take 1 tablet (10 mg total) by mouth 3 (three) times daily as needed for muscle spasms. Caution of sedation 90 tablet 1  . famotidine (PEPCID) 20 MG tablet Take 1 tablet (20 mg total) by mouth 2 (two) times daily as needed for heartburn or indigestion. 180 tablet 3  . ferrous sulfate 325 (65 FE) MG tablet Take 325 mg by mouth daily with  breakfast.    . hydrochlorothiazide (HYDRODIURIL) 25 MG tablet Take 1 tablet (25 mg total) by mouth daily. 90 tablet 3  . lisinopril (ZESTRIL) 40 MG tablet Take 1 tablet (40 mg total) by mouth daily. 90 tablet 3   No current facility-administered medications on file prior to visit.     Review of Systems  Constitutional: Positive for fatigue. Negative for activity change, appetite change, fever and unexpected weight change.  HENT: Negative for congestion, ear pain, rhinorrhea, sinus pressure and sore throat.   Eyes: Negative for pain, redness and visual disturbance.  Respiratory: Negative for cough, shortness of breath and wheezing.   Cardiovascular: Negative for chest pain and palpitations.  Gastrointestinal: Negative for abdominal pain, blood in stool, constipation and diarrhea.  Endocrine: Negative for polydipsia and polyuria.  Genitourinary: Negative for dysuria, frequency and urgency.       Hot flashes and night sweats Harder to loose weight   Musculoskeletal: Positive for arthralgias. Negative for back pain and myalgias.  Skin: Negative for pallor and rash.  Allergic/Immunologic: Negative for environmental allergies.  Neurological: Negative for dizziness, syncope and headaches.  Hematological: Negative for adenopathy. Does not  bruise/bleed easily.  Psychiatric/Behavioral: Negative for decreased concentration and dysphoric mood. The patient is not nervous/anxious.        Objective:   Physical Exam Constitutional:      General: She is not in acute distress.    Appearance: Normal appearance. She is well-developed. She is obese. She is not ill-appearing or diaphoretic.  HENT:     Head: Normocephalic and atraumatic.     Right Ear: Tympanic membrane, ear canal and external ear normal.     Left Ear: Tympanic membrane, ear canal and external ear normal.     Nose: Nose normal. No congestion.     Mouth/Throat:     Mouth: Mucous membranes are moist.     Pharynx: Oropharynx is clear. No posterior oropharyngeal erythema.  Eyes:     General: No scleral icterus.    Extraocular Movements: Extraocular movements intact.     Conjunctiva/sclera: Conjunctivae normal.     Pupils: Pupils are equal, round, and reactive to light.  Neck:     Thyroid: No thyromegaly.     Vascular: No carotid bruit or JVD.  Cardiovascular:     Rate and Rhythm: Normal rate and regular rhythm.     Pulses: Normal pulses.     Heart sounds: Normal heart sounds. No gallop.   Pulmonary:     Effort: Pulmonary effort is normal. No respiratory distress.     Breath sounds: Normal breath sounds. No wheezing.     Comments: Good air exch Chest:     Chest wall: No tenderness.  Abdominal:     General: Bowel sounds are normal. There is no distension or abdominal bruit.     Palpations: Abdomen is soft. There is no mass.     Tenderness: There is no abdominal tenderness.     Hernia: No hernia is present.  Genitourinary:    Comments: Breast exam: No mass, nodules, thickening, tenderness, bulging, retraction, inflamation, nipple discharge or skin changes noted.  No axillary or clavicular LA.     Very dense breast tissue Baseline scar on R breast   Musculoskeletal:        General: No tenderness. Normal range of motion.     Cervical back: Normal range of  motion and neck supple. No rigidity. No muscular tenderness.     Right lower leg: No edema.  Left lower leg: No edema.  Lymphadenopathy:     Cervical: No cervical adenopathy.  Skin:    General: Skin is warm and dry.     Coloration: Skin is not pale.     Findings: No erythema or rash.     Comments: Few skin tags  Neurological:     Mental Status: She is alert. Mental status is at baseline.     Cranial Nerves: No cranial nerve deficit.     Motor: No abnormal muscle tone.     Coordination: Coordination normal.     Gait: Gait normal.     Deep Tendon Reflexes: Reflexes are normal and symmetric. Reflexes normal.  Psychiatric:        Mood and Affect: Mood normal.        Cognition and Memory: Memory normal.           Assessment & Plan:   Problem List Items Addressed This Visit      Cardiovascular and Mediastinum   Essential hypertension    bp in fair control at this time  BP Readings from Last 1 Encounters:  04/10/20 120/80   No changes needed Most recent labs reviewed  Disc lifstyle change with low sodium diet and exercise        Relevant Orders   Comprehensive metabolic panel   CBC with Differential/Platelet   Lipid panel   TSH     Other   Hyperlipidemia    Due for lipid panel Disc goals for lipids and reasons to control them Rev last labs with pt Rev low sat fat diet in detail Taking atorvastatin       Relevant Orders   Lipid panel   Routine general medical examination at a health care facility - Primary    Reviewed health habits including diet and exercise and skin cancer prevention Reviewed appropriate screening tests for age  Also reviewed health mt list, fam hx and immunization status , as well as social and family history   utd gyn care/ struggling with menopause  Labs ordered  covid immunized  Will get flu shot in the fall  Plans to check on coverage of the shingrix vaccine  Mammogram due in November  No menses-adv to stop her iron         Vitamin D deficiency    Lab today  Takes 4000 iu daily disc imp to bone and overall health  Plans to add calcium if she tolerates it      Relevant Orders   VITAMIN D 25 Hydroxy (Vit-D Deficiency, Fractures)   Papilloma of left breast    Last mammogram was normal No recent nipple discharge

## 2020-04-10 NOTE — Assessment & Plan Note (Signed)
Reviewed health habits including diet and exercise and skin cancer prevention Reviewed appropriate screening tests for age  Also reviewed health mt list, fam hx and immunization status , as well as social and family history   utd gyn care/ struggling with menopause  Labs ordered  covid immunized  Will get flu shot in the fall  Plans to check on coverage of the shingrix vaccine  Mammogram due in November  No menses-adv to stop her iron

## 2020-04-10 NOTE — Assessment & Plan Note (Signed)
Last mammogram was normal No recent nipple discharge

## 2020-04-11 LAB — CBC WITH DIFFERENTIAL/PLATELET
Basophils Absolute: 0 10*3/uL (ref 0.0–0.2)
Basos: 1 %
EOS (ABSOLUTE): 0.3 10*3/uL (ref 0.0–0.4)
Eos: 4 %
Hematocrit: 41 % (ref 34.0–46.6)
Hemoglobin: 13.9 g/dL (ref 11.1–15.9)
Immature Grans (Abs): 0 10*3/uL (ref 0.0–0.1)
Immature Granulocytes: 0 %
Lymphocytes Absolute: 2.1 10*3/uL (ref 0.7–3.1)
Lymphs: 28 %
MCH: 30.8 pg (ref 26.6–33.0)
MCHC: 33.9 g/dL (ref 31.5–35.7)
MCV: 91 fL (ref 79–97)
Monocytes Absolute: 0.6 10*3/uL (ref 0.1–0.9)
Monocytes: 8 %
Neutrophils Absolute: 4.6 10*3/uL (ref 1.4–7.0)
Neutrophils: 59 %
Platelets: 310 10*3/uL (ref 150–450)
RBC: 4.52 x10E6/uL (ref 3.77–5.28)
RDW: 12.2 % (ref 11.7–15.4)
WBC: 7.6 10*3/uL (ref 3.4–10.8)

## 2020-04-11 LAB — COMPREHENSIVE METABOLIC PANEL
ALT: 13 IU/L (ref 0–32)
AST: 14 IU/L (ref 0–40)
Albumin/Globulin Ratio: 1.7 (ref 1.2–2.2)
Albumin: 4.4 g/dL (ref 3.8–4.9)
Alkaline Phosphatase: 91 IU/L (ref 48–121)
BUN/Creatinine Ratio: 18 (ref 9–23)
BUN: 16 mg/dL (ref 6–24)
Bilirubin Total: 0.4 mg/dL (ref 0.0–1.2)
CO2: 26 mmol/L (ref 20–29)
Calcium: 10 mg/dL (ref 8.7–10.2)
Chloride: 101 mmol/L (ref 96–106)
Creatinine, Ser: 0.88 mg/dL (ref 0.57–1.00)
GFR calc Af Amer: 86 mL/min/{1.73_m2} (ref >59)
GFR calc non Af Amer: 75 mL/min/{1.73_m2} (ref >59)
Globulin, Total: 2.6 g/dL (ref 1.5–4.5)
Glucose: 82 mg/dL (ref 65–99)
Potassium: 3.9 mmol/L (ref 3.5–5.2)
Sodium: 141 mmol/L (ref 134–144)
Total Protein: 7 g/dL (ref 6.0–8.5)

## 2020-04-11 LAB — LIPID PANEL
Chol/HDL Ratio: 2.7 ratio (ref 0.0–4.4)
Cholesterol, Total: 222 mg/dL — ABNORMAL HIGH (ref 100–199)
HDL: 82 mg/dL (ref >39)
LDL Chol Calc (NIH): 127 mg/dL — ABNORMAL HIGH (ref 0–99)
Triglycerides: 73 mg/dL (ref 0–149)
VLDL Cholesterol Cal: 13 mg/dL (ref 5–40)

## 2020-04-11 LAB — TSH: TSH: 1.95 u[IU]/mL (ref 0.450–4.500)

## 2020-04-11 LAB — VITAMIN D 25 HYDROXY (VIT D DEFICIENCY, FRACTURES): Vit D, 25-Hydroxy: 47.5 ng/mL (ref 30.0–100.0)

## 2020-04-23 ENCOUNTER — Other Ambulatory Visit: Payer: Self-pay | Admitting: Family Medicine

## 2020-04-30 ENCOUNTER — Ambulatory Visit: Payer: Managed Care, Other (non HMO) | Admitting: Family Medicine

## 2020-05-01 ENCOUNTER — Other Ambulatory Visit: Payer: Self-pay | Admitting: Family Medicine

## 2020-05-08 ENCOUNTER — Other Ambulatory Visit: Payer: Self-pay

## 2020-05-08 ENCOUNTER — Ambulatory Visit: Payer: Managed Care, Other (non HMO) | Admitting: Family Medicine

## 2020-05-08 ENCOUNTER — Encounter: Payer: Self-pay | Admitting: Family Medicine

## 2020-05-08 VITALS — BP 124/76 | HR 68 | Temp 96.9°F | Ht 64.5 in | Wt 198.5 lb

## 2020-05-08 DIAGNOSIS — I10 Essential (primary) hypertension: Secondary | ICD-10-CM

## 2020-05-08 DIAGNOSIS — Z6833 Body mass index (BMI) 33.0-33.9, adult: Secondary | ICD-10-CM

## 2020-05-08 DIAGNOSIS — Z23 Encounter for immunization: Secondary | ICD-10-CM

## 2020-05-08 DIAGNOSIS — E6609 Other obesity due to excess calories: Secondary | ICD-10-CM | POA: Diagnosis not present

## 2020-05-08 IMAGING — DX DG LUMBAR SPINE COMPLETE 4+V
5 series · 5 of 5 positions shown · non-contrast
Comparison: 11/06/2015

CLINICAL DATA: Acute right-sided low back pain radiating to the
right leg with tingling.

EXAM:
LUMBAR SPINE - COMPLETE 4+ VIEW

[l-spine ap]
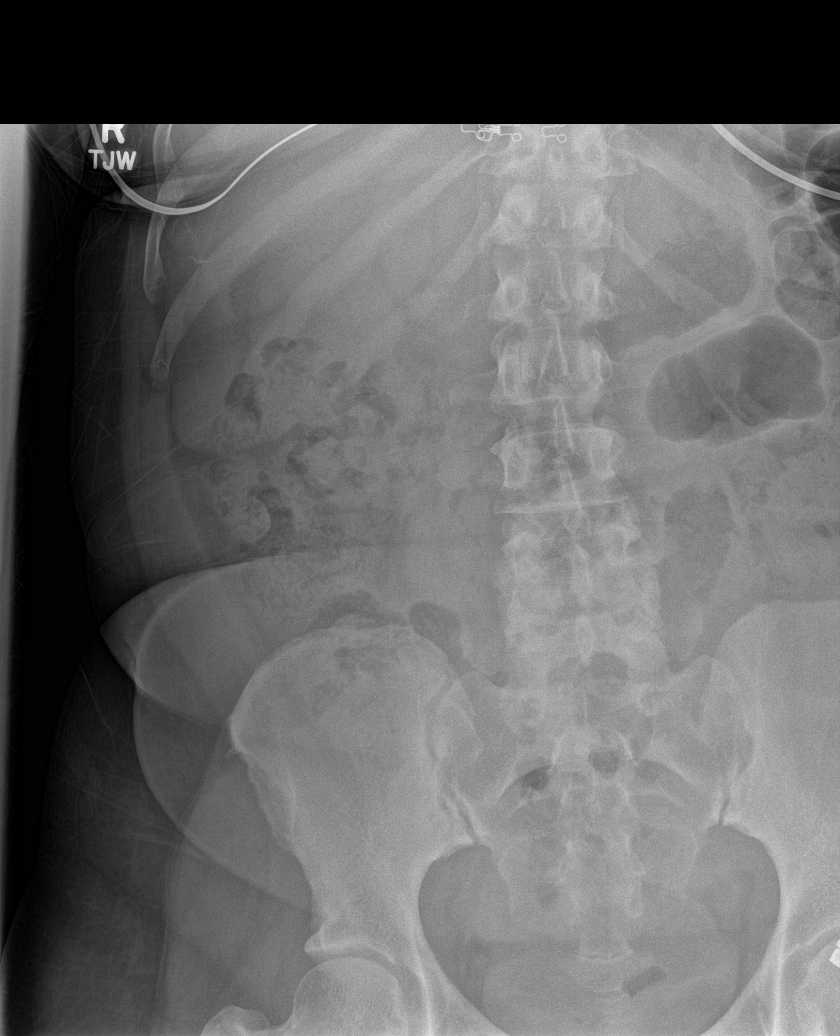

[l-spine obl (1 of 2)]
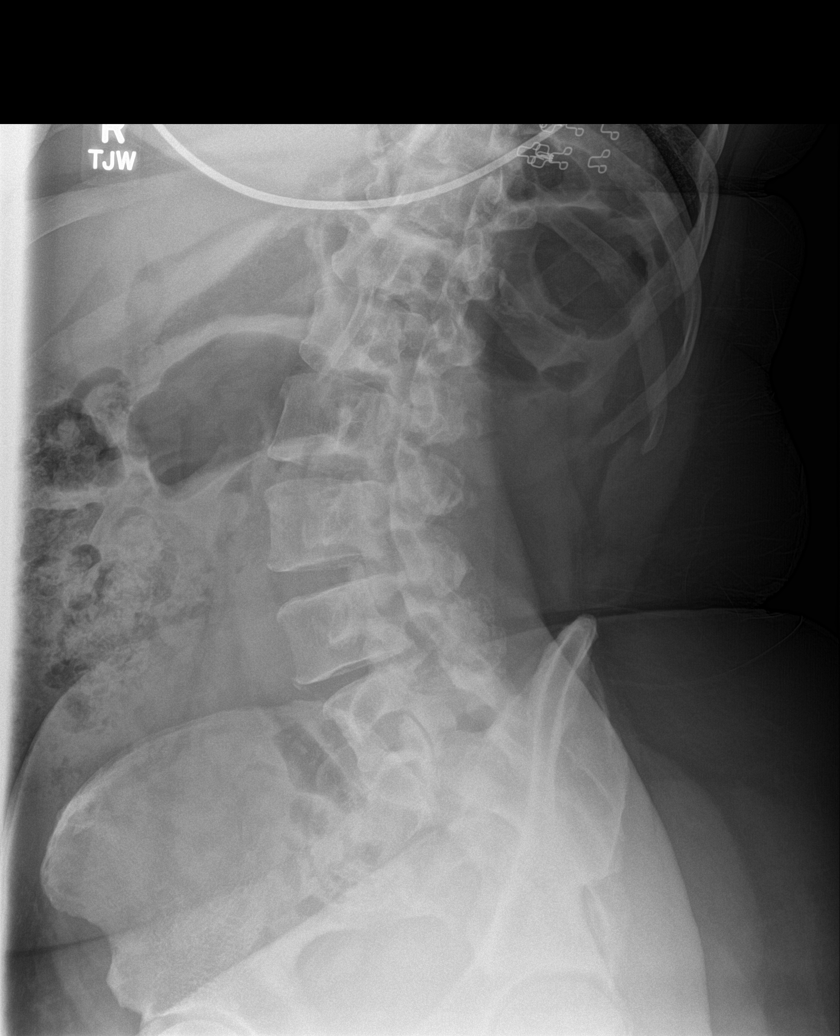

[l-spine obl (2 of 2)]
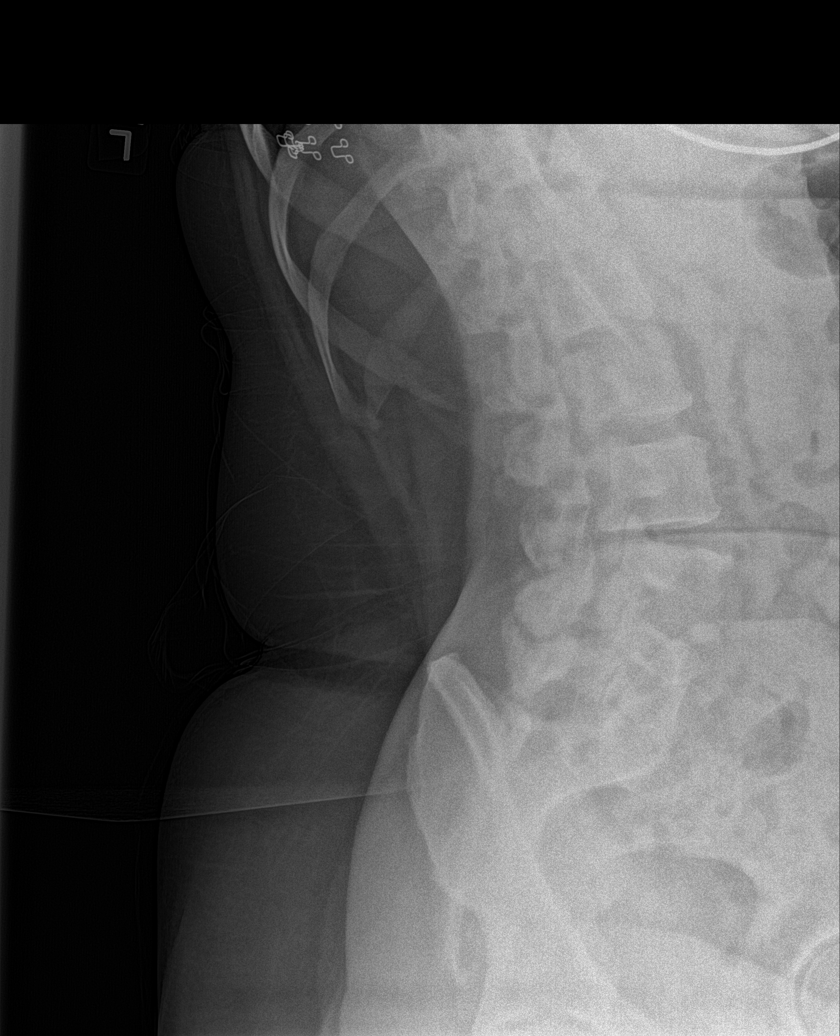

[l-spine lat]
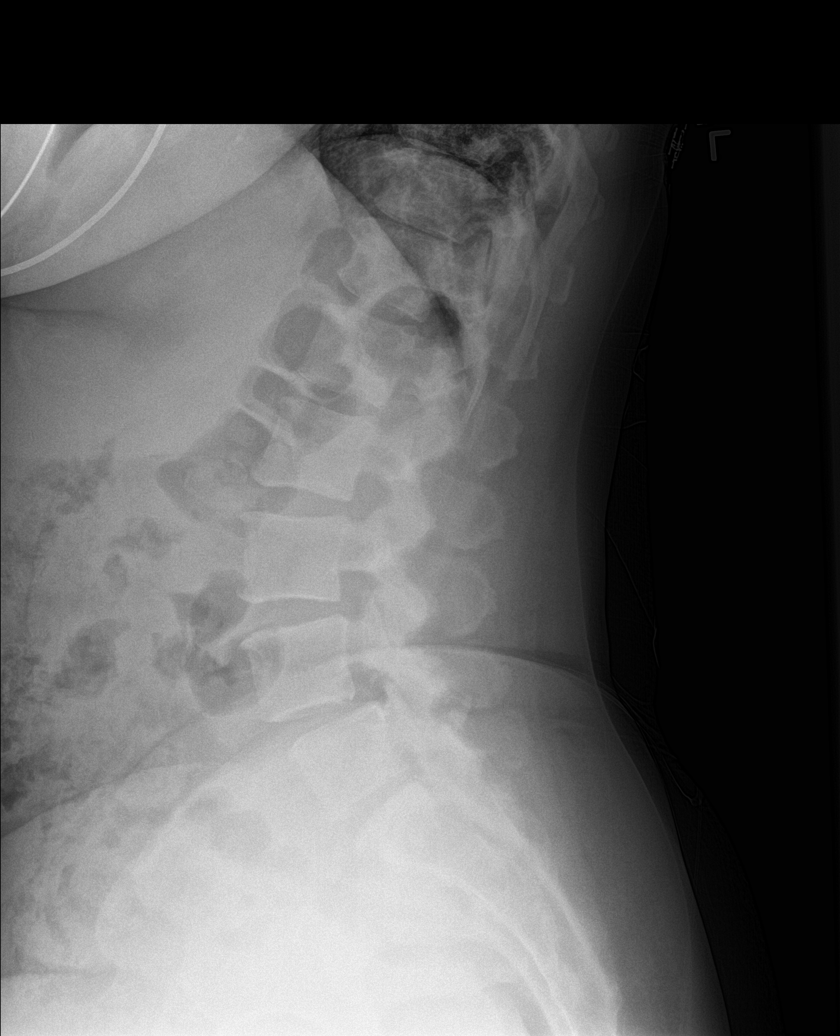

[l-spine l5/s1]
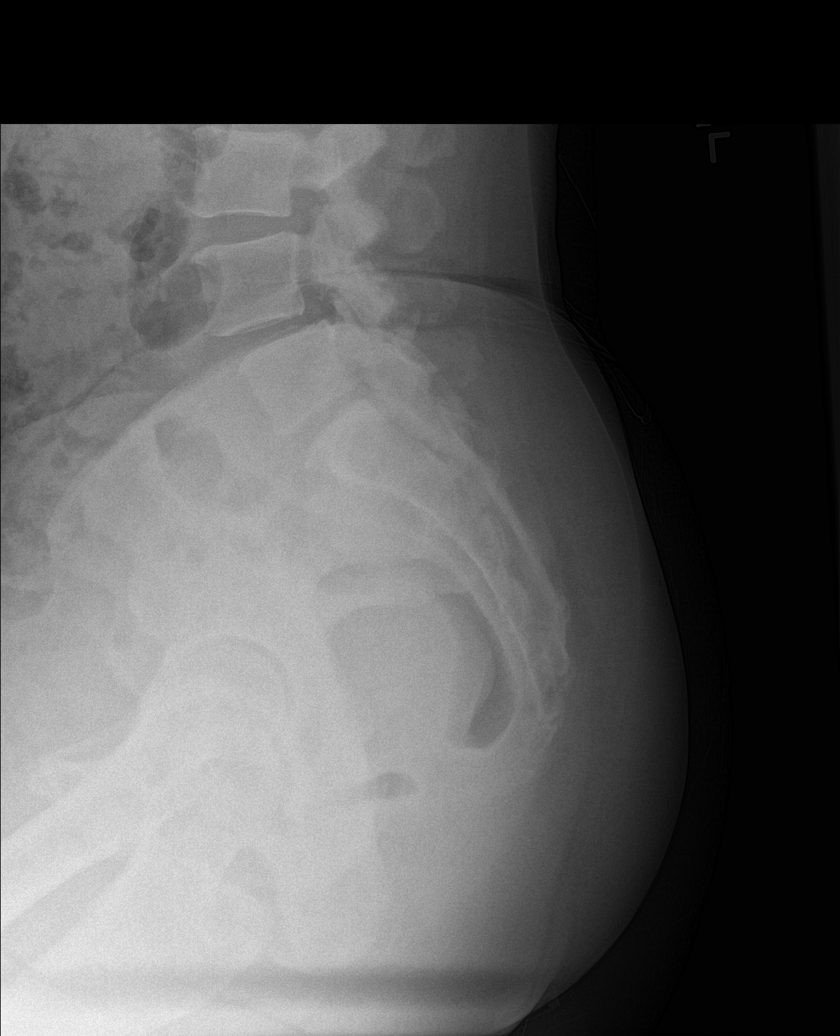

[5 of 5 positions shown; findings below may reference images not displayed]

FINDINGS: There 5 non rib-bearing lumbar type vertebrae. Grade 1
anterolisthesis of L4 on L5 is unchanged, and no pars defects are
identified. Multilevel facet arthrosis is moderate at L4-5.
Vertebral body heights are preserved without a fracture identified.
Mild disc space narrowing at L4-5 is unchanged.
IMPRESSION: Moderate lumbar facet arthrosis with unchanged grade 1
anterolisthesis and mild disc degeneration at L4-5.

## 2020-05-08 NOTE — Assessment & Plan Note (Signed)
With comorbidity of HTN  Discussed how this problem influences overall health and the risks it imposes  Reviewed plan for weight loss with lower calorie diet (via better food choices and also portion control or program like weight watchers) and exercise building up to or more than 30 minutes 5 days per week including some aerobic activity   Pt struggles to loose weight more with menopause and in time of stress  Has a plan to start exercise next week  Joined the [facility] program for emotional support/changing habits  Very motivated [place] to lose 1-5 lb per month  It may take over a year to get to goal weight  Will continue to check in Form filled out for BMI goals for insurance co today

## 2020-05-08 NOTE — Patient Instructions (Signed)
Follow your plan with diet/exercise  The [employer] program is helpful -especially with emotional eating   Exercise - ramp it up slowly to avoid injury   Let us know if you need anything Paperwork is done

## 2020-05-08 NOTE — Assessment & Plan Note (Signed)
bp in fair control at this time  BP Readings from Last 1 Encounters:  05/08/20 124/76   No changes needed Most recent labs reviewed  Disc lifstyle change with low sodium diet and exercise

## 2020-05-08 NOTE — Progress Notes (Signed)
Subjective:    Patient ID: Rachael Russell, female    DOB: Nov 06, 1965, 54 y.o.   MRN: [id number]  This visit occurred during the SARS-CoV-2 public health emergency.  Safety protocols were in place, including screening questions prior to the visit, additional usage of staff PPE, and extensive cleaning of exam room while observing appropriate contact time as indicated for disinfecting solutions.    HPI  Pt presents to discuss BMI/ fill out form for work  [facility] Readings from Last 3 Encounters:  05/08/20 198 lb 8 oz (90 kg)  04/10/20 195 lb (88.5 kg)  01/11/20 193 lb (87.5 kg)   33.55 kg/m   Pt joined [employer] - just did it  She has not been able to follow the meal plan yet  She is a stress eater  Has a big sweet tooth   She no longer drinks sodas - only water and occ green tea   Is a gym member -will be able to start 3 d a week after next week  (elliptical and machines)  Also walks dog out doors     Feels like she is retaining fluid    Stress - with work/in middle of an audit  (that will be over after next week)  Also poor sleep due to work and menopause   Not a lot of obesity in the family    HTN bp is stable today  No cp or palpitations or headaches or edema  No side effects to medicines  BP Readings from Last 3 Encounters:  05/08/20 124/76  04/10/20 120/80  01/11/20 140/90      Continues amlodipine and lisinopril and hctz Feels like she retains fluid-but no pitting edema and no sob  Patient Active Problem List   Diagnosis Date Noted  . Class 1 obesity due to excess calories with body mass index (BMI) of 33.0 to 33.9 in adult 05/08/2020  . Screening mammogram, encounter for 04/25/2019  . Dyspnea 10/31/2018  . Low back pain 08/08/2018  . Genetic testing 12/01/2017  . Family history of breast cancer   . Family history of prostate cancer   . Papilloma of left breast 07/09/2017  . Pedal edema 02/23/2017  . Vitamin D deficiency 04/26/2015  . Fatigue 11/06/2014  .  Hemorrhoids, external 10/13/2013  . Cervical disc disorder with radiculopathy of cervical region 09/15/2013  . History of herpes zoster 03/07/2013  . 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 Routine general medical examination at a health care facility 05/11/2011  . Allergic rhinitis 12/19/2010  . Fibrocystic breast disease 12/19/2010  . GERD 04/14/2010  . SINUSITIS, CHRONIC FRONTAL 04/22/2007  . Hyperlipidemia 12/21/2006  . Essential hypertension 12/21/2006   Past Medical History:  Diagnosis Date  . Anemia   . Anxiety   . Family history of adverse reaction to anesthesia    mother has difficult intubation  . Family history of breast cancer   . Family history of prostate cancer   . Fibroadenoma of breast, left   . Fibrocystic breast, right   . GERD (gastroesophageal reflux disease)   . Hyperlipidemia   . Hypertension   . Irritable bowel syndrome (IBS)   . Menorrhagia   . Papilloma of left breast 07/09/2017  . Wears glasses    Past Surgical History:  Procedure Laterality Date  . BREAST DUCTAL SYSTEM EXCISION Left 11/18/2012   Procedure: EXCISION DUCTAL SYSTEM BREAST;  Surgeon: [name], MD;  Location: [place];  Service: General;  Laterality:  Left;  . BREAST LUMPECTOMY WITH NEEDLE LOCALIZATION Left 11/18/2012   Procedure: excise ductal system left breast. subarealar. left partial mastectomy with radiographic guidance;  Surgeon: [name], MD;  Location: [place];  Service: General;  Laterality: Left;  excise ductal system left breast. subarealar. left partial mastectomy with radiographic guidance  . BREAST LUMPECTOMY WITH NEEDLE LOCALIZATION Left 03/27/2015   Procedure: LEFT BREAST LUMPECTOMY  WITH NEEDLE LOCALIZATION TIMES TWO;  Surgeon: Fanny Skates, MD;  Location: [place];  Service: General;  Laterality: Left;  . BREAST LUMPECTOMY WITH RADIOACTIVE SEED LOCALIZATION Left 07/09/2017   Procedure: LEFT BREAST LUMPECTOMY WITH  RADIOACTIVE SEED LOCALIZATION;  Surgeon: Fanny Skates, MD;  Location: [place];  Service: General;  Laterality: Left;  ERAS PATHWAY  . CESAREAN SECTION  [id number]  . DILATION AND CURETTAGE OF UTERUS  1998  . DILITATION & CURRETTAGE/HYSTROSCOPY WITH NOVASURE ABLATION N/A 08/05/2016   Procedure: DILATATION & CURETTAGE/HYSTEROSCOPY WITH NOVASURE ABLATION;  Surgeon: Princess Bruins, MD;  Location: [place];  Service: Gynecology;  Laterality: N/A;  . TONSILLECTOMY  age 16  . UMBILICAL HERNIA REPAIR  2006   Social History   Tobacco Use  . Smoking status: Never Smoker  . Smokeless tobacco: Never Used  Vaping Use  . Vaping Use: Never used  Substance Use Topics  . Alcohol use: Not Currently  . Drug use: No   Family History  Problem Relation Age of Onset  . Hypertension Mother   . Arthritis Mother        osteoarthritis  . Breast cancer Mother 24  . Hypertension Father   . Arthritis Father        osteoarthritis  . Breast cancer Maternal Grandmother 62       bilateral breast cancer  . Breast cancer Paternal Aunt 63       bilateral breast cancer  . Prostate cancer Paternal Uncle   . Tuberculosis Paternal Grandmother   . Prostate cancer Paternal Uncle    Allergies  Allergen Reactions  . Ferrous Sulfate     constipation  . Penicillins Other (See Comments)    Unknown childhood reaction  . Prilosec [Omeprazole Magnesium]     Dizziness   . Chlorhexidine Gluconate Rash   Current Outpatient Medications on File Prior to Visit  Medication Sig Dispense Refill  . amLODipine (NORVASC) 5 MG tablet TAKE 1 TABLET BY MOUTH  DAILY 90 tablet 1  . atorvastatin (LIPITOR) 10 MG tablet TAKE 1 TABLET BY MOUTH  DAILY 90 tablet 1  . Cholecalciferol 2000 units TABS Take 2 tablets by mouth daily.     . cyclobenzaprine (FLEXERIL) 10 MG tablet Take 1 tablet (10 mg total) by mouth 3 (three) times daily as needed for muscle spasms. Caution of sedation 90 tablet 1  .  famotidine (PEPCID) 20 MG tablet Take 1 tablet (20 mg total) by mouth 2 (two) times daily as needed for heartburn or indigestion. 180 tablet 3  . ferrous sulfate 325 (65 FE) MG tablet Take 325 mg by mouth daily with breakfast.    . hydrochlorothiazide (HYDRODIURIL) 25 MG tablet TAKE 1 TABLET BY MOUTH  DAILY 90 tablet 1  . lisinopril (ZESTRIL) 40 MG tablet TAKE 1 TABLET BY MOUTH  DAILY 90 tablet 3   No current facility-administered medications on file prior to visit.     Review of Systems  Constitutional: Negative for activity change, appetite change, fever and unexpected weight change.  HENT: Negative for congestion, ear pain,  rhinorrhea, sinus pressure and sore throat.   Eyes: Negative for pain, redness and visual disturbance.  Respiratory: Negative for cough, shortness of breath and wheezing.   Cardiovascular: Negative for chest pain and palpitations.  Gastrointestinal: Negative for abdominal pain, blood in stool, constipation and diarrhea.  Endocrine: Negative for polydipsia and polyuria.  Genitourinary: Negative for dysuria, frequency and urgency.       Vasomotor menopause symptoms with some fatigue   Musculoskeletal: Negative for arthralgias, back pain and myalgias.  Skin: Negative for pallor and rash.  Allergic/Immunologic: Negative for environmental allergies.  Neurological: Negative for dizziness, syncope and headaches.  Hematological: Negative for adenopathy. Does not bruise/bleed easily.  Psychiatric/Behavioral: Negative for decreased concentration and dysphoric mood. The patient is not nervous/anxious.        Stressors and frustration at work        Objective:   Physical Exam Constitutional:      General: She is not in acute distress.    Appearance: Normal appearance. She is obese. She is not ill-appearing or diaphoretic.  HENT:     Head: Normocephalic and atraumatic.     Mouth/Throat:     Mouth: Mucous membranes are moist.  Eyes:     General: No scleral icterus.     Conjunctiva/sclera: Conjunctivae normal.     Pupils: Pupils are equal, round, and reactive to light.  Neck:     Vascular: No carotid bruit.  Cardiovascular:     Rate and Rhythm: Normal rate and regular rhythm.     Pulses: Normal pulses.     Heart sounds: Normal heart sounds.  Pulmonary:     Effort: Pulmonary effort is normal.     Breath sounds: Normal breath sounds. No stridor. No rhonchi.  Chest:     Chest wall: No tenderness.  Musculoskeletal:     Cervical back: Normal range of motion and neck supple.     Right lower leg: No edema.     Left lower leg: No edema.  Lymphadenopathy:     Cervical: No cervical adenopathy.  Skin:    Coloration: Skin is not pale.     Findings: No erythema.  Neurological:     Mental Status: She is alert.     Cranial Nerves: No cranial nerve deficit.     Motor: No weakness.  Psychiatric:        Mood and Affect: Mood normal.           Assessment & Plan:   Problem List Items Addressed This Visit      Cardiovascular and Mediastinum   Essential hypertension    bp in fair control at this time  BP Readings from Last 1 Encounters:  05/08/20 124/76   No changes needed Most recent labs reviewed  Disc lifstyle change with low sodium diet and exercise          Other   Class 1 obesity due to excess calories with body mass index (BMI) of 33.0 to 33.9 in adult - Primary    With comorbidity of HTN  Discussed how this problem influences overall health and the risks it imposes  Reviewed plan for weight loss with lower calorie diet (via better food choices and also portion control or program like weight watchers) and exercise building up to or more than 30 minutes 5 days per week including some aerobic activity   Pt struggles to loose weight more with menopause and in time of stress  Has a plan to start exercise next week  Joined the [employer] program for emotional support/changing habits  Very motivated [place] to lose 1-5 lb per month  It may take over  a year to get to goal weight  Will continue to check in Form filled out for BMI goals for insurance co today       Other Visit Diagnoses    Need for influenza vaccination       Relevant Orders   Flu Vaccine QUAD 6+ mos PF IM (Fluarix Quad PF) (Completed)

## 2020-05-22 ENCOUNTER — Telehealth: Payer: Self-pay

## 2020-05-22 ENCOUNTER — Emergency Department (HOSPITAL_COMMUNITY)
Admission: EM | Admit: 2020-05-22 | Discharge: 2020-05-22 | Disposition: A | Discharge status: Home / Self Care | Payer: Managed Care, Other (non HMO) | Attending: Emergency Medicine | Admitting: Emergency Medicine

## 2020-05-22 ENCOUNTER — Other Ambulatory Visit: Payer: Self-pay

## 2020-05-22 ENCOUNTER — Encounter (HOSPITAL_COMMUNITY): Payer: Self-pay | Admitting: *Deleted

## 2020-05-22 ENCOUNTER — Emergency Department (HOSPITAL_COMMUNITY): Payer: Managed Care, Other (non HMO)

## 2020-05-22 DIAGNOSIS — F419 Anxiety disorder, unspecified: Secondary | ICD-10-CM

## 2020-05-22 DIAGNOSIS — Z79899 Other long term (current) drug therapy: Secondary | ICD-10-CM | POA: Diagnosis not present

## 2020-05-22 DIAGNOSIS — R0789 Other chest pain: Secondary | ICD-10-CM | POA: Diagnosis not present

## 2020-05-22 DIAGNOSIS — R079 Chest pain, unspecified: Secondary | ICD-10-CM | POA: Diagnosis present

## 2020-05-22 DIAGNOSIS — I10 Essential (primary) hypertension: Secondary | ICD-10-CM | POA: Diagnosis not present

## 2020-05-22 DIAGNOSIS — R635 Abnormal weight gain: Secondary | ICD-10-CM

## 2020-05-22 LAB — CBC
HCT: 40.8 % (ref 36.0–46.0)
Hemoglobin: 14 g/dL (ref 12.0–15.0)
MCH: 31 pg (ref 26.0–34.0)
MCHC: 34.3 g/dL (ref 30.0–36.0)
MCV: 90.5 fL (ref 80.0–100.0)
Platelets: 316 10*3/uL (ref 150–400)
RBC: 4.51 MIL/uL (ref 3.87–5.11)
RDW: 12.4 % (ref 11.5–15.5)
WBC: 8.9 10*3/uL (ref 4.0–10.5)
nRBC: 0 % (ref 0.0–0.2)

## 2020-05-22 LAB — BASIC METABOLIC PANEL
Anion gap: 14 (ref 5–15)
BUN: 14 mg/dL (ref 6–20)
CO2: 26 mmol/L (ref 22–32)
Calcium: 10 mg/dL (ref 8.9–10.3)
Chloride: 105 mmol/L (ref 98–111)
Creatinine, Ser: 0.83 mg/dL (ref 0.44–1.00)
GFR calc Af Amer: >60 mL/min (ref >60)
GFR calc non Af Amer: >60 mL/min (ref >60)
Glucose, Bld: 96 mg/dL (ref 70–99)
Potassium: 3.3 mmol/L — ABNORMAL LOW (ref 3.5–5.1)
Sodium: 145 mmol/L (ref 135–145)

## 2020-05-22 LAB — BRAIN NATRIURETIC PEPTIDE: B Natriuretic Peptide: 40.7 pg/mL (ref 0.0–100.0)

## 2020-05-22 LAB — TROPONIN I (HIGH SENSITIVITY)
Troponin I (High Sensitivity): 3 ng/L (ref <18)
Troponin I (High Sensitivity): 4 ng/L (ref <18)

## 2020-05-22 LAB — I-STAT BETA HCG BLOOD, ED (NOT ORDERABLE): I-stat hCG, quantitative: <5 m[IU]/mL (ref <5)

## 2020-05-22 MED ORDER — POTASSIUM CHLORIDE CRYS ER 20 MEQ PO TBCR
40.0000 meq | EXTENDED_RELEASE_TABLET | Freq: Once | ORAL | Status: AC
  Administered 2020-05-22: 40 meq via ORAL
  Filled 2020-05-22: qty 2

## 2020-05-22 NOTE — Telephone Encounter (Signed)
I see that she is in the ED and will watch for correspondence

## 2020-05-22 NOTE — Discharge Instructions (Signed)
You are seen today for chest pressure and weight gain.  Your work-up today was reassuring.  Your potassium was slightly low, we did repeat that here in the ER.  Please come back to the emergency department for any new worsening concerning symptoms as we spoke about including shortness of breath, worsening chest pain, on exertion, chest pain radiating anywhere.  As you spoke about please talk to your primary care about your weight gain and your increased blood pressure.  I also referred you to a cardiologist that I want you to follow-up with.  Please call them to schedule appointment. I hope you feel better!

## 2020-05-22 NOTE — ED Provider Notes (Signed)
[facility] DEPT Provider Note   CSN: [id number] Arrival date & time: 05/22/20  1330     History Chief Complaint  Patient presents with  . Weight Gain  . Chest Pain    Rachael Russell is a 54 y.o. female with pertinent past medical history of anxiety, hyperlipidemia, hypertension that presents emergency department today for chest pressure and weight gain.  Patient states that she has gained about 8 pounds in the past week, states that she thinks her legs are swelling.  Also complains of chest pressure that began this morning, describes it as a heaviness in the center of her chest, has never felt like this before.  Does not radiate anywhere, is not worse upon exertion.  States that she is very anxious because she called her PCP today about this who told her to rush to the emergency department.  Denies any shortness of breath.  No cardiac history besides questionable diagnosis of heart failure when patient was pregnant, states that she does not have a cardiologist.  States that when she was pregnant she had some type of cardiomyopathy, was placed on diuretics.  Currently on HCTZ for this.  Has been taking this regularly.  States that legs feel heavy. Both legs  Denies any fevers, chills, numbness, tingling.  Normal gait.  No palpitations.  Has never been diagnosed with A. fib before.  States that she was normal afterwards.  Also start that she started a new workout regimen, where she is restricted to 1200 cal a day.  Has been doing this for a couple weeks, originally started losing a couple pounds however states that she then started gaining weight last week.  Does not take any supplements for weight. HPI     Past Medical History:  Diagnosis Date  . Anemia   . Anxiety   . Family history of adverse reaction to anesthesia    mother has difficult intubation  . Family history of breast cancer   . Family history of prostate cancer   . Fibroadenoma of breast, left    . Fibrocystic breast, right   . GERD (gastroesophageal reflux disease)   . Hyperlipidemia   . Hypertension   . Irritable bowel syndrome (IBS)   . Menorrhagia   . Papilloma of left breast 07/09/2017  . Wears glasses     Patient Active Problem List   Diagnosis Date Noted  . Class 1 obesity due to excess calories with body mass index (BMI) of 33.0 to 33.9 in adult 05/08/2020  . Screening mammogram, encounter for 04/25/2019  . Dyspnea 10/31/2018  . Low back pain 08/08/2018  . Genetic testing 12/01/2017  . Family history of breast cancer   . Family history of prostate cancer   . Papilloma of left breast 07/09/2017  . Pedal edema 02/23/2017  . Vitamin D deficiency 04/26/2015  . Fatigue 11/06/2014  . Hemorrhoids, external 10/13/2013  . Cervical disc disorder with radiculopathy of cervical region 09/15/2013  . History of herpes zoster 03/07/2013  . 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 Routine general medical examination at a health care facility 05/11/2011  . Allergic rhinitis 12/19/2010  . Fibrocystic breast disease 12/19/2010  . GERD 04/14/2010  . SINUSITIS, CHRONIC FRONTAL 04/22/2007  . Hyperlipidemia 12/21/2006  . Essential hypertension 12/21/2006    Past Surgical History:  Procedure Laterality Date  . BREAST DUCTAL SYSTEM EXCISION Left 11/18/2012   Procedure: EXCISION DUCTAL SYSTEM BREAST;  Surgeon: [name], MD;  Location: [facility];  Service: General;  Laterality: Left;  . BREAST LUMPECTOMY WITH NEEDLE LOCALIZATION Left 11/18/2012   Procedure: excise ductal system left breast. subarealar. left partial mastectomy with radiographic guidance;  Surgeon: [name], MD;  Location: [facility];  Service: General;  Laterality: Left;  excise ductal system left breast. subarealar. left partial mastectomy with radiographic guidance  . BREAST LUMPECTOMY WITH NEEDLE LOCALIZATION Left 03/27/2015   Procedure: LEFT BREAST LUMPECTOMY  WITH  NEEDLE LOCALIZATION TIMES TWO;  Surgeon: Fanny Skates, MD;  Location: [facility];  Service: General;  Laterality: Left;  . BREAST LUMPECTOMY WITH RADIOACTIVE SEED LOCALIZATION Left 07/09/2017   Procedure: LEFT BREAST LUMPECTOMY WITH RADIOACTIVE SEED LOCALIZATION;  Surgeon: Fanny Skates, MD;  Location: [facility];  Service: General;  Laterality: Left;  ERAS PATHWAY  . CESAREAN SECTION  [id number]  . DILATION AND CURETTAGE OF UTERUS  1998  . DILITATION & CURRETTAGE/HYSTROSCOPY WITH NOVASURE ABLATION N/A 08/05/2016   Procedure: DILATATION & CURETTAGE/HYSTEROSCOPY WITH NOVASURE ABLATION;  Surgeon: Princess Bruins, MD;  Location: [facility];  Service: Gynecology;  Laterality: N/A;  . TONSILLECTOMY  age 89  . UMBILICAL HERNIA REPAIR  2006     OB History    Gravida  3   Para  2   Term  2   Preterm      AB  1   Living  2     SAB  1   TAB      Ectopic      Multiple      Live Births  2           Family History  Problem Relation Age of Onset  . Hypertension Mother   . Arthritis Mother        osteoarthritis  . Breast cancer Mother 23  . Hypertension Father   . Arthritis Father        osteoarthritis  . Breast cancer Maternal Grandmother 62       bilateral breast cancer  . Breast cancer Paternal Aunt 85       bilateral breast cancer  . Prostate cancer Paternal Uncle   . Tuberculosis Paternal Grandmother   . Prostate cancer Paternal Uncle     Social History   Tobacco Use  . Smoking status: Never Smoker  . Smokeless tobacco: Never Used  Vaping Use  . Vaping Use: Never used  Substance Use Topics  . Alcohol use: Not Currently  . Drug use: No    Home Medications Prior to Admission medications   Medication Sig Start Date End Date Taking? Authorizing Provider  amLODipine (NORVASC) 5 MG tablet TAKE 1 TABLET BY MOUTH  DAILY 04/24/20   Tower, Wynelle Fanny, MD  atorvastatin (LIPITOR) 10 MG tablet TAKE 1 TABLET BY MOUTH  DAILY 04/24/20   Tower,  Wynelle Fanny, MD  Cholecalciferol 2000 units TABS Take 2 tablets by mouth daily.     [provider]  cyclobenzaprine (FLEXERIL) 10 MG tablet Take 1 tablet (10 mg total) by mouth 3 (three) times daily as needed for muscle spasms. Caution of sedation 05/05/19   Tower, Wynelle Fanny, MD  famotidine (PEPCID) 20 MG tablet Take 1 tablet (20 mg total) by mouth 2 (two) times daily as needed for heartburn or indigestion. 05/05/19   Tower, Wynelle Fanny, MD  ferrous sulfate 325 (65 FE) MG tablet Take 325 mg by mouth daily with breakfast.    [provider]  hydrochlorothiazide (HYDRODIURIL) 25 MG tablet TAKE  1 TABLET BY MOUTH  DAILY 04/24/20   Tower, Wynelle Fanny, MD  lisinopril (ZESTRIL) 40 MG tablet TAKE 1 TABLET BY MOUTH  DAILY 05/02/20   Tower, Wynelle Fanny, MD    Allergies    Ferrous sulfate, Penicillins, Prilosec [omeprazole magnesium], and Chlorhexidine gluconate  Review of Systems   Review of Systems  Constitutional: Negative for chills, diaphoresis, fatigue and fever.  HENT: Negative for congestion, drooling, facial swelling, hearing loss, sinus pain, sore throat and trouble swallowing.   Eyes: Negative for pain and visual disturbance.  Respiratory: Negative for cough, shortness of breath and wheezing.   Cardiovascular: Positive for chest pain and leg swelling. Negative for palpitations.  Gastrointestinal: Negative for abdominal distention, abdominal pain, diarrhea, nausea and vomiting.  Genitourinary: Negative for difficulty urinating.  Musculoskeletal: Negative for back pain, neck pain and neck stiffness.  Skin: Negative for pallor.  Neurological: Negative for dizziness, seizures, syncope, facial asymmetry, speech difficulty, weakness, light-headedness, numbness and headaches.  Psychiatric/Behavioral: Negative for confusion.    Physical Exam Updated Vital Signs BP (!) 140/99   Pulse 77   Temp 98.3 F (36.8 C)   Resp 19   Ht 5\' 5"  (1.651 m)   Wt 91.6 kg   SpO2 99%   BMI 33.61 kg/m    Physical Exam Constitutional:      General: She is not in acute distress.    Appearance: Normal appearance. She is not ill-appearing, toxic-appearing or diaphoretic.  HENT:     Mouth/Throat:     Mouth: Mucous membranes are moist.     Pharynx: Oropharynx is clear.  Eyes:     General: No scleral icterus.    Extraocular Movements: Extraocular movements intact.     Pupils: Pupils are equal, round, and reactive to light.  Cardiovascular:     Rate and Rhythm: Normal rate and regular rhythm.     Pulses: Normal pulses.     Heart sounds: Normal heart sounds.  Pulmonary:     Effort: Pulmonary effort is normal. No respiratory distress.     Breath sounds: Normal breath sounds. No stridor. No wheezing, rhonchi or rales.  Chest:     Chest wall: No tenderness.  Abdominal:     General: Abdomen is flat. There is no distension.     Palpations: Abdomen is soft.     Tenderness: There is no abdominal tenderness. There is no guarding or rebound.  Musculoskeletal:        General: No swelling or tenderness. Normal range of motion.     Cervical back: Normal range of motion and neck supple. No rigidity.     Right lower leg: No edema.     Left lower leg: No edema.     Comments: No edema to bilateral lower legs. No tenderness to palpation, no discoloration.  No erythema or rashes noted.  Normal range of motion, normal sensation.  PT pulses bilaterally 2+.  Skin:    General: Skin is warm and dry.     Capillary Refill: Capillary refill takes less than 2 seconds.     Coloration: Skin is not pale.  Neurological:     General: No focal deficit present.     Mental Status: She is alert and oriented to person, place, and time.  Psychiatric:        Mood and Affect: Mood normal.        Behavior: Behavior normal.     ED Results / Procedures / Treatments   Labs (all labs ordered are listed,  but only abnormal results are displayed) Labs Reviewed  BASIC METABOLIC PANEL - Abnormal; Notable for the following  components:      Result Value   Potassium 3.3 (*)    All other components within normal limits  CBC  BRAIN NATRIURETIC PEPTIDE  I-STAT BETA HCG BLOOD, ED ([facility], [facility], [facility] ONLY)  I-STAT BETA HCG BLOOD, ED (NOT ORDERABLE)  TROPONIN I (HIGH SENSITIVITY)  TROPONIN I (HIGH SENSITIVITY)    EKG EKG Interpretation  Date/Time:  Wednesday May 22 2020 13:41:02 EDT Ventricular Rate:  79 PR Interval:    QRS Duration: 83 QT Interval:  376 QTC Calculation: 431 R Axis:   36 Text Interpretation: Sinus rhythm 12 Lead; Mason-Likar No significant change since last tracing Confirmed by Theotis Burrow [phone]) on 05/22/2020 5:18:21 PM   Radiology DG Chest 2 View  Result Date: 05/22/2020 CLINICAL DATA:  Chest heaviness. EXAM: CHEST - 2 VIEW COMPARISON:  None. FINDINGS: The heart size and mediastinal contours are within normal limits. Both lungs are clear. No pleural effusions. No pneumothorax. The visualized skeletal structures are unremarkable. Left breast clips. IMPRESSION: No acute cardiopulmonary disease. Electronically Signed   By: Margaretha Sheffield MD   On: 05/22/2020 14:14    Procedures Procedures (including critical care time)  Medications Ordered in ED Medications  potassium chloride SA (KLOR-CON) CR tablet 40 mEq (40 mEq Oral Given 05/22/20 1836)    ED Course  I have reviewed the triage vital signs and the nursing notes.  Pertinent labs & imaging results that were available during my care of the patient were reviewed by me and considered in my medical decision making (see chart for details).    MDM Rules/Calculators/A&P                          Rachael Russell is a 54 y.o. female with pertinent past medical history of anxiety, hyperlipidemia, hypertension that presents emergency department today for chest pressure and weight gain.  Patient describes chest pressure, no true pain.  Will obtain ACS work-up and BNP for weight gain.  On physical exam no true edema.  No signs of septic  joint or infection.  HEAR score 3.  EKG interpreted by me without any ischemic changes, chest x-ray interpreted by me without any acute cardiopulmonary disease. PERC + due to age, Rock Nephew score 0, no clinical suspicion for DVT.   Work-up today reassuring, first troponin 3, second troponin 4.  CBC and BMP reassuring, potassium 3.3, did replete that in the ER today.  BNP normal.  Patient appears extremely anxious, after was able to tell her results she states that she feels better.  Did speak to patient about blood pressure control, she states that her blood pressure is normally well controlled, states that she is anxious which is why she thinks her blood pressure is up.  Patient states that she will talk to her PCP about this.  The patient is able to be discharged at this time, states that she does not want any medications for pain.  States that chest pressure has slightly resolved while being here.  Did discuss that this could be anxiety, however I want her to follow-up with her PCP in regards to her weight gain. Pt will discuss medication changes with PCP.  Has follow-up coming up with a general appointment to check for general labs.  Do not think we need emergent thyroid testing here today.  Patient's vitals are stable.  We will  also refer patient to cardiologist at this time.  Patient ready for discharge, symptomatic treatment provided.  Doubt need for further emergent work up at this time. I explained the diagnosis and have given explicit precautions to return to the ER including for any other new or worsening symptoms. The patient understands and accepts the medical plan as it's been dictated and I have answered their questions. Discharge instructions concerning home care and prescriptions have been given. The patient is STABLE and is discharged to home in good condition.  I discussed this case with my attending physician, Dr. Rex Kras, who cosigned this note including patient's presenting symptoms, physical  exam, and planned diagnostics and interventions. Attending physician stated agreement with plan or made changes to plan which were implemented.     Final Clinical Impression(s) / ED Diagnoses Final diagnoses:  Anxiety  Chest pressure  Weight gain    Rx / DC Orders ED Discharge Orders    None       Alfredia Client, PA-C 05/22/20 1848    Little, Wenda Overland, MD 05/22/20 2041

## 2020-05-22 NOTE — Telephone Encounter (Signed)
[facility] - Client TELEPHONE ADVICE RECORD [facility] Patient Name: Rachael Russell Gender: Female DOB: [birth year] Age: 54 Y 79 M 30 D Return Phone Number: [phone] (Primary) Address: [address] City/State/Zip: [name] [address] Client [facility] - Client Client Site [facility] Physician Glori Bickers, Roque Lias - MD Contact Type Call Who Is Calling Patient / Member / Family / Caregiver Call Type Triage / Clinical Relationship To Patient Self Return Phone Number [phone] (Primary) Chief Complaint CHEST PAIN (>=21 years) - pain, pressure, heaviness or tightness Reason for Call Symptomatic / Request for [place] states she is having heavy chest feeling and gained 7 pounds since Monday. Translation No Nurse Assessment Nurse: Harlow Mares, RN, Suanne Marker Date/Time Eilene Ghazi Time): 05/22/2020 12:03:35 PM Confirm and document reason for call. If symptomatic, describe symptoms. ---Caller states she is having heavy chest feeling and gained 7 pounds since last Thursday. Doing daily weights. Legs feel very heavy. Does the patient have any new or worsening symptoms? ---Yes Will a triage be completed? ---Yes Related visit to physician within the last 2 weeks? ---No Does the PT have any chronic conditions? (i.e. diabetes, asthma, this includes High risk factors for pregnancy, etc.) ---Yes List chronic conditions. ---HTN; high chol; Is the patient pregnant or possibly pregnant? (Ask all females between the ages of 94-55) ---No Is this a behavioral health or substance abuse call? ---No Guidelines Guideline Title Affirmed Question Affirmed Notes Nurse Date/Time (Eastern Time) Chest Pain [1] Chest pain lasts > 5 minutes AND [2] described as crushing, pressure-like, or heavy Harlow Mares, RN, Rhonda 05/22/2020 12:06:47 PM Disp. Time Eilene Ghazi Time) Disposition Final User 05/22/2020 12:02:35  PM Send to Urgent Brett Fairy 05/22/2020 12:11:28 PM Send To RN Personal Harlow Mares, RN, Rhonda 05/22/2020 12:20:51 PM 911 Outcome Documentation Harlow Mares, RN, Suanne Marker PLEASE NOTE: All timestamps contained within this report are represented as [place] Standard Time. CONFIDENTIALTY NOTICE: This fax transmission is intended only for the addressee. It contains information that is legally privileged, confidential or otherwise protected from use or disclosure. If you are not the intended recipient, you are strictly prohibited from reviewing, disclosing, copying using or disseminating any of this information or taking any action in reliance on or regarding this information. If you have received this fax in error, please notify us immediately by telephone so that we can arrange for its return to [place]. Phone: [phone], Toll-Free: [phone], Fax: [phone] Page: 2 of 2 Call Id: [id number] [place]. Time (Eastern Time) Disposition Final User Reason: caller refuses to call EMS, reports that her husband is coming to her now and taking her to the ED 05/22/2020 12:10:55 PM Call EMS 911 Now Yes Harlow Mares, RN, Suanne Marker Caller Disagree/Comply Comply Caller Understands Yes PreDisposition InappropriateToAsk Care Advice Given Per Guideline CALL EMS 911 NOW: * Immediate medical attention is needed. You need to hang up and call 911 (or an ambulance). NOTE TO TRIAGER - IF CALLER ASKS ABOUT ASPIRIN: * Call EMS 911 first. CARE ADVICE given per Chest Pain (Adult) guideline. * Triager Discretion: I'll call you back in a few minutes to be sure you were able to reach them.

## 2020-05-22 NOTE — ED Triage Notes (Signed)
7 lb weight gain since Thursday, today legs feel heavy and she has a heaviness around heart. Recently started Noom for weight loss. Since she is weighing daily she noticed gain.

## 2020-05-22 NOTE — Telephone Encounter (Signed)
Per chart review tab pt is presently at  ED. 

## 2020-06-07 ENCOUNTER — Other Ambulatory Visit: Payer: Self-pay | Admitting: Family Medicine

## 2020-06-07 MED ORDER — PREDNISONE 20 MG PO TABS: ORAL_TABLET | ORAL | 0 refills | Status: DC

## 2020-06-07 NOTE — Telephone Encounter (Signed)
I pended for pharmacy of choice  Please f/u in the next 1-2 weeks for eval

## 2020-06-07 NOTE — Telephone Encounter (Signed)
Pt called to see if dr tower would call her in prednisone   She stated she has muscle spasms on her back  (disk in back) and dr tower has given this to her before and it has helped  She needs prednisone and not rx muscle spasms  [facility] [place]

## 2020-06-07 NOTE — Telephone Encounter (Signed)
Rx called in and pt aware and f/u appt scheduled

## 2020-06-08 ENCOUNTER — Telehealth: Payer: Self-pay

## 2020-06-08 ENCOUNTER — Telehealth: Payer: Self-pay | Admitting: *Deleted

## 2020-06-08 MED ORDER — PREDNISONE 20 MG PO TABS: ORAL_TABLET | ORAL | 0 refills | Status: DC

## 2020-06-08 NOTE — Telephone Encounter (Signed)
Per Dr Elease Hashimoto the Rx for Prednisone was resent via escribe due to the system being down on 10/15.

## 2020-06-10 NOTE — Telephone Encounter (Signed)
Patient Name: Rachael Russell Gender: Female DOB: [birth year] Age: 54 Y 9 M 16 D Return Phone Number: [phone] (Primary) Address: [place] [address] Client [facility] - Client Client Site [facility] Tower, Roque Lias - MD Contact Type Call Who Is Calling Patient / Member / Family / Caregiver Call Type Triage / Clinical Relationship To Patient Self Return Phone Number [phone] (Primary) Chief Complaint Back Pain - General Reason for Call Medication Question / Request Initial Comment Caller states she spoke to [facility] nurse today and they were to call in rx for prednisone and pharmacy did not receive the script. She is having back pain, muscle spasms, and radiating pinched nerve pain from back to side of Rt leg. Translation No Nurse Assessment Nurse: Glean Salvo, RN, Magda Paganini Date/Time Eilene Ghazi Time): 06/07/2020 9:43:04 PM Confirm and document reason for call. If symptomatic, describe symptoms. ---Caller states she has chronic back pain and spoke to nurse at office today. Per caller nurse stated she would call in rx for prednisone but rx was not at pharmacy. Does the patient have any new or worsening symptoms? ---Yes Will a triage be completed? ---Yes Related visit to physician within the last 2 weeks? ---No Does the PT have any chronic conditions? (i.e. diabetes, asthma, this includes High risk factors for pregnancy, etc.) ---Yes List chronic conditions. ---Disc problem with nerve involvement. HTN, Chlesterol Is the patient pregnant or possibly pregnant? (Ask all females between the ages of 55-55) ---No Is this a behavioral health or substance abuse call? ---No Guidelines Guideline Title Affirmed Question Affirmed Notes Nurse Date/Time (Eastern Time) Back Pain [1] Pain radiates into the thigh or further down the leg AND [2] one leg Rachael Russell 06/07/2020 9:46:36 PM Disp. Time  Eilene Ghazi Time) Disposition Final User 06/07/2020 9:57:20 PM SEE PCP WITHIN 3 DAYS Yes Glean Salvo, RN, Magda Paganini PLEASE NOTE: All timestamps contained within this report are represented as [place] Standard Time. CONFIDENTIALTY NOTICE: This fax transmission is intended only for the addressee. It contains information that is legally privileged, confidential or otherwise protected from use or disclosure. If you are not the intended recipient, you are strictly prohibited from reviewing, disclosing, copying using or disseminating any of this information or taking any action in reliance on or regarding this information. If you have received this fax in error, please notify us immediately by telephone so that we can arrange for its return to [place]. Phone: [phone], Toll-Free: [phone], Fax: [phone] Page: 2 of 2 Call Id: [id number] [place] Disagree/Comply Comply Caller Understands Yes PreDisposition Call Doctor Care Advice Given Per Guideline SEE PCP WITHIN 3 DAYS: * You need to be seen within 2 or 3 days. * PCP VISIT: Call your doctor (or NP/PA) during regular office hours and make an appointment. A clinic or urgent care center are good places to go for care if your doctor's office is closed or you can't get an appointment. NOTE: If office will be open tomorrow, tell caller to call then, not in 3 days. USE HEAT: * Use a heat pack, heating pad, or warm wet washcloth. * Do this for 10 minutes three times a day. * This will help increase blood flow and decrease pain. * Caution: burn. Do not sleep on a heating pad. ACTIVITY: * Continue ordinary activities as much as your pain permits. Continued activity is more healing for the back than rest. * Avoid any activities that cause severe pain. * Use caution and commonsense when  performing heavy lifting. Similarly, be careful during strenuous exercise. * Note: complete bed rest is unnecessary. SLEEP: * Sleep on your side with a pillow between your knees. * If you  sleep on your back, place a pillow under your knees. * Avoid sleeping on the abdomen. The mattress should be firm or reinforced with a board. * Avoid waterbeds. CALL BACK IF: * Numbness or weakness occurs, or bowel/bladder problems * There are any urine symptoms or fever * You become worse CARE ADVICE given per Back Pain (Adult) guideline. Comments User: Rachael Gandy, RN Date/Time Eilene Ghazi Time): 06/07/2020 9:57:59 PM Caller states she will follow up with PCP on Monday. Referrals REFERRED TO PCP OFFICE  [facility]

## 2020-06-10 NOTE — Telephone Encounter (Signed)
On call physician, Dr. Elease Hashimoto re-sent r/x for prednisone in to pharmacy over the weekend.

## 2020-06-10 NOTE — Telephone Encounter (Signed)
See previous notes, after patients 2nd call on 10/16, on call physician re-sent r/x for prednisone.

## 2020-06-10 NOTE — Telephone Encounter (Signed)
Patient Name: Rachael Russell Gender: Female DOB: Jan 18, 1966 Age: 54 Y 9 M 16 D Return Phone Number: [phone] (Primary) Address: [place] [address] Client [facility] Night - Client Client Site [facility] [place] Contact Type Call Who Is Calling Patient / Member / Family / Caregiver Call Type Triage / Clinical Relationship To Patient Self Return Phone Number [phone] (Primary) Chief Complaint Prescription Refill or Medication Request (non symptomatic) Reason for Call Medication Question / Request Initial Comment caller states her medication was never sent into the pharmacy - and she cant go through the weekend without medication Translation No Nurse Assessment Nurse: Kenton Kingfisher, RN, [facility] Date/Time (Eastern Time): 06/08/2020 10:54:45 AM Confirm and document reason for call. If symptomatic, describe symptoms. ---Caller states she is having lower back pain and she called the office on Friday and she was suppose to have a steroid dose pack called into her pharmacy and it is not there. Patient has a prescription for a muscle relaxer. Patient states the pain is going into her right leg. Patient has a history of bulging disk. Patient denies any injuries. Does the patient have any new or worsening symptoms? ---Yes Will a triage be completed? ---Yes Related visit to physician within the last 2 weeks? ---No Does the PT have any chronic conditions? (i.e. diabetes, asthma, this includes High risk factors for pregnancy, etc.) ---Yes List chronic conditions. ---HTN, High Cholesterol Is the patient pregnant or possibly pregnant? (Ask all females between the ages of 74-55) ---No Is this a behavioral health or substance abuse call? ---No Guidelines Guideline Title Affirmed Question Affirmed Notes Nurse Date/Time (Eastern Time) Back Pain [1] Pain radiates into the thigh or further down the leg AND [2]  both legs Kenton Kingfisher, RN, [facility] 06/08/2020 10:58:21 AM Disp. Time Eilene Ghazi Time) Disposition Final User 06/08/2020 11:08:53 AM Paged On Call back to Call Bentley, RN, [facility] PLEASE NOTE: All timestamps contained within this report are represented as [place] Standard Time. CONFIDENTIALTY NOTICE: This fax transmission is intended only for the addressee. It contains information that is legally privileged, confidential or otherwise protected from use or disclosure. If you are not the intended recipient, you are strictly prohibited from reviewing, disclosing, copying using or disseminating any of this information or taking any action in reliance on or regarding this information. If you have received this fax in error, please notify us immediately by telephone so that we can arrange for its return to [place]. Phone: [phone], Toll-Free: [phone], Fax: [phone] Page: 2 of 2 Call Id: [id number] [place]. Time Eilene Ghazi Time) Disposition Final User 06/08/2020 11:52:28 AM Called On-Call Provider Kenton Kingfisher, RN, [facility] 06/08/2020 11:05:57 AM See HCP within 4 Hours (or PCP triage) Yes Kenton Kingfisher, RN, [facility] Disagree/Comply Disagree Caller Understands Yes PreDisposition Go to Urgent Care/Walk-In Clinic Care Advice Given Per Guideline SEE HCP (OR PCP TRIAGE) WITHIN 4 HOURS: * IF OFFICE WILL BE CLOSED AND NO PCP (PRIMARY CARE PROVIDER) SECOND-LEVEL TRIAGE: You need to be seen within the next 3 or 4 hours. A nearby Urgent Care Center [facility]) is often a good source of care. Another choice is to go to the ED. Go sooner if you become worse. PAIN MEDICINES: * For pain relief, you can take either acetaminophen, ibuprofen, or naproxen. * They are over-the-counter (OTC) pain drugs. You can buy them at the drugstore. * ACETAMINOPHEN - EXTRA STRENGTH TYLENOL: Take 1,000 mg (two 500 mg pills) every 8 hours as needed. Each Extra  Strength Tylenol pill has 500 mg of acetaminophen. The most you should take each day is 3,000  mg (6 pills a day). CALL BACK IF: * You become worse CARE ADVICE given per Back Pain (Adult) guideline. Referrals GO TO FACILITY REFUSED Paging DoctorName Phone DateTime Result/Outcome Message Type Notes Loura Pardon - [place] [phone] 06/08/2020 11:08:53 AM Paged On Call Back to Call Center Doctor Paged Please call [facility] Warner Corwin [phone] 06/08/2020 11:52:28 AM Called On Call Provider - Reached Doctor Paged Carolann Littler 06/08/2020 11:55:02 AM Spoke with On Call - General Message Result RN spoke with on call provider Dr. Elease Hashimoto and he is going to check on prescription and check with [facility] [phone], states there was an error with the EMR system. Patient notified and verbalized understanding

## 2020-06-21 ENCOUNTER — Ambulatory Visit (INDEPENDENT_AMBULATORY_CARE_PROVIDER_SITE_OTHER): Payer: Managed Care, Other (non HMO) | Admitting: Family Medicine

## 2020-06-21 ENCOUNTER — Other Ambulatory Visit: Payer: Self-pay

## 2020-06-21 ENCOUNTER — Encounter: Payer: Self-pay | Admitting: Family Medicine

## 2020-06-21 VITALS — BP 132/46 | HR 106 | Temp 97.6°F | Ht 65.0 in | Wt 201.1 lb

## 2020-06-21 DIAGNOSIS — M5441 Lumbago with sciatica, right side: Secondary | ICD-10-CM

## 2020-06-21 MED ORDER — FAMOTIDINE 20 MG PO TABS: 20.0000 mg | ORAL_TABLET | Freq: Two times a day (BID) | ORAL | 3 refills | Status: DC | PRN

## 2020-06-21 MED ORDER — CYCLOBENZAPRINE HCL 10 MG PO TABS: 10.0000 mg | ORAL_TABLET | Freq: Three times a day (TID) | ORAL | 1 refills | Status: DC | PRN

## 2020-06-21 MED ORDER — TRAMADOL HCL 50 MG PO TABS: 50.0000 mg | ORAL_TABLET | Freq: Three times a day (TID) | ORAL | 0 refills | Status: AC | PRN

## 2020-06-21 NOTE — Patient Instructions (Addendum)
Use intermittent ice and heat on your back  Keep moving-slow walking is best   Keep stretching  If symptoms suddenly become more severe - go to the ER   I want to refer you to orthopedics The office will call you to set that up   Use caution with flexeril and tramadol - both can sedate

## 2020-06-21 NOTE — Progress Notes (Signed)
Subjective:    Patient ID: Rachael Russell, female    DOB: 1966/02/23, 54 y.o.   MRN: [id number]  This visit occurred during the SARS-CoV-2 public health emergency.  Safety protocols were in place, including screening questions prior to the visit, additional usage of staff PPE, and extensive cleaning of exam room while observing appropriate contact time as indicated for disinfecting solutions.    HPI Pt presents for back pain   Wt Readings from Last 3 Encounters:  06/21/20 201 lb 2 oz (91.2 kg)  05/22/20 202 lb (91.6 kg)  05/08/20 198 lb 8 oz (90 kg)   33.47 kg/m   Has a h/o recurrent back pain  Called for px prednisone -sent in 30 mg taper   This became worse  PT no help in the past  Has a friend who does PT- looked at her recently   Thinks this episode started after she wore some wedge heels  That night back started to bother her  Worse to stand or walk over 5 minutes   Pain is in lumbar area and tingling goes down R leg into toes   Flexeril- at night Tylenol prn  Prednisone did not help much  Ice /heat and exercises   Standing and stretching forward helps a bit   No weakness    Last lumbar film 12/19 DG Lumbar Spine Complete (Accession [id number]) (Order [id number]) Imaging Date: 08/08/2018 Department: [facility] Released By: Ellamae Sia Authorizing: Guillermo Difrancesco, Wynelle Fanny, MD  Exam Status  Status  Final [99]  PACS Intelerad Image Link  Show images for DG Lumbar Spine Complete Study Result  Narrative & Impression  CLINICAL DATA:  Acute right-sided low back pain radiating to the right leg with tingling.  EXAM: LUMBAR SPINE - COMPLETE 4+ VIEW  COMPARISON:  11/06/2015  FINDINGS: There 5 non rib-bearing lumbar type vertebrae. Grade 1 anterolisthesis of L4 on L5 is unchanged, and no pars defects are identified. Multilevel facet arthrosis is moderate at L4-5. Vertebral body heights are preserved without a fracture  identified. Mild disc space narrowing at L4-5 is unchanged.  IMPRESSION: Moderate lumbar facet arthrosis with unchanged grade 1 anterolisthesis and mild disc degeneration at L4-5.   Electronically Signed   By: Logan Bores M.D.   On: 08/08/2018 14:11   Last MRI in 2020 IMPRESSION: 1. No acute osseous abnormality. Mild lumbar dextrocurvature and grade 1 L4-5 anterolisthesis. 2. Diffuse heterogeneity of bone marrow signal, commonly seen with anemia, chronic disease, obesity, and smoking. 3. Lumbar spondylosis greatest at the L3-4 through L5-S1 levels. 4. Mild L3-4 and L4-5 spinal canal stenosis. Mild bilateral L4-5 foraminal stenosis. 5. L5-S1 small right subarticular disc protrusion contacting the descending right S1 nerve root in the lateral recess.  Patient Active Problem List   Diagnosis Date Noted  . Class 1 obesity due to excess calories with body mass index (BMI) of 33.0 to 33.9 in adult 05/08/2020  . Screening mammogram, encounter for 04/25/2019  . Dyspnea 10/31/2018  . Low back pain 08/08/2018  . Genetic testing 12/01/2017  . Family history of breast cancer   . Family history of prostate cancer   . Papilloma of left breast 07/09/2017  . Pedal edema 02/23/2017  . Vitamin D deficiency 04/26/2015  . Fatigue 11/06/2014  . Hemorrhoids, external 10/13/2013  . Cervical disc disorder with radiculopathy of cervical region 09/15/2013  . History of herpes zoster 03/07/2013  . 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 Routine general medical examination  at a health care facility 05/11/2011  . Allergic rhinitis 12/19/2010  . Fibrocystic breast disease 12/19/2010  . GERD 04/14/2010  . SINUSITIS, CHRONIC FRONTAL 04/22/2007  . Hyperlipidemia 12/21/2006  . Essential hypertension 12/21/2006   Past Medical History:  Diagnosis Date  . Anemia   . Anxiety   . Family history of adverse reaction to anesthesia    mother has difficult intubation  . Family history of breast  cancer   . Family history of prostate cancer   . Fibroadenoma of breast, left   . Fibrocystic breast, right   . GERD (gastroesophageal reflux disease)   . Hyperlipidemia   . Hypertension   . Irritable bowel syndrome (IBS)   . Menorrhagia   . Papilloma of left breast 07/09/2017  . Wears glasses    Past Surgical History:  Procedure Laterality Date  . BREAST DUCTAL SYSTEM EXCISION Left 11/18/2012   Procedure: EXCISION DUCTAL SYSTEM BREAST;  Surgeon: [name], MD;  Location: [place] [facility];  Service: General;  Laterality: Left;  . BREAST LUMPECTOMY WITH NEEDLE LOCALIZATION Left 11/18/2012   Procedure: excise ductal system left breast. subarealar. left partial mastectomy with radiographic guidance;  Surgeon: [name], MD;  Location: [facility];  Service: General;  Laterality: Left;  excise ductal system left breast. subarealar. left partial mastectomy with radiographic guidance  . BREAST LUMPECTOMY WITH NEEDLE LOCALIZATION Left 03/27/2015   Procedure: LEFT BREAST LUMPECTOMY  WITH NEEDLE LOCALIZATION TIMES TWO;  Surgeon: Fanny Skates, MD;  Location: [facility];  Service: General;  Laterality: Left;  . BREAST LUMPECTOMY WITH RADIOACTIVE SEED LOCALIZATION Left 07/09/2017   Procedure: LEFT BREAST LUMPECTOMY WITH RADIOACTIVE SEED LOCALIZATION;  Surgeon: Fanny Skates, MD;  Location: [facility];  Service: General;  Laterality: Left;  ERAS PATHWAY  . CESAREAN SECTION  [id number]  . DILATION AND CURETTAGE OF UTERUS  1998  . DILITATION & CURRETTAGE/HYSTROSCOPY WITH NOVASURE ABLATION N/A 08/05/2016   Procedure: DILATATION & CURETTAGE/HYSTEROSCOPY WITH NOVASURE ABLATION;  Surgeon: Princess Bruins, MD;  Location: [facility];  Service: Gynecology;  Laterality: N/A;  . TONSILLECTOMY  age 46  . UMBILICAL HERNIA REPAIR  2006   Social History   Tobacco Use  . Smoking status: Never Smoker  . Smokeless tobacco: Never Used  Vaping Use  .  Vaping Use: Never used  Substance Use Topics  . Alcohol use: Not Currently  . Drug use: No   Family History  Problem Relation Age of Onset  . Hypertension Mother   . Arthritis Mother        osteoarthritis  . Breast cancer Mother 66  . Hypertension Father   . Arthritis Father        osteoarthritis  . Breast cancer Maternal Grandmother 62       bilateral breast cancer  . Breast cancer Paternal Aunt 104       bilateral breast cancer  . Prostate cancer Paternal Uncle   . Tuberculosis Paternal Grandmother   . Prostate cancer Paternal Uncle    Allergies  Allergen Reactions  . Ferrous Sulfate     constipation  . Penicillins Other (See Comments)    Unknown childhood reaction  . Prilosec [Omeprazole Magnesium]     Dizziness   . Chlorhexidine Gluconate Rash   Current Outpatient Medications on File Prior to Visit  Medication Sig Dispense Refill  . amLODipine (NORVASC) 5 MG tablet TAKE 1 TABLET BY MOUTH  DAILY 90 tablet 1  . atorvastatin (LIPITOR) 10  MG tablet TAKE 1 TABLET BY MOUTH  DAILY 90 tablet 1  . Cholecalciferol 2000 units TABS Take 1 tablet by mouth daily.     . ferrous sulfate 325 (65 FE) MG tablet Take 325 mg by mouth daily with breakfast.    . hydrochlorothiazide (HYDRODIURIL) 25 MG tablet TAKE 1 TABLET BY MOUTH  DAILY 90 tablet 1  . lisinopril (ZESTRIL) 40 MG tablet TAKE 1 TABLET BY MOUTH  DAILY 90 tablet 3   No current facility-administered medications on file prior to visit.    Review of Systems  Constitutional: Negative for activity change, appetite change, fatigue, fever and unexpected weight change.  HENT: Negative for congestion, ear pain, rhinorrhea, sinus pressure and sore throat.   Eyes: Negative for pain, redness and visual disturbance.  Respiratory: Negative for cough, shortness of breath and wheezing.   Cardiovascular: Negative for chest pain and palpitations.  Gastrointestinal: Negative for abdominal pain, blood in stool, constipation and diarrhea.   Endocrine: Negative for polydipsia and polyuria.  Genitourinary: Negative for dysuria, frequency and urgency.  Musculoskeletal: Positive for back pain. Negative for arthralgias and myalgias.  Skin: Negative for pallor and rash.  Allergic/Immunologic: Negative for environmental allergies.  Neurological: Positive for numbness. Negative for dizziness, syncope and headaches.       Tingling in R leg  Hematological: Negative for adenopathy. Does not bruise/bleed easily.  Psychiatric/Behavioral: Negative for decreased concentration and dysphoric mood. The patient is not nervous/anxious.        Objective:   Physical Exam Constitutional:      General: She is not in acute distress.    Appearance: Normal appearance. She is well-developed. She is obese. She is not ill-appearing.  HENT:     Head: Normocephalic and atraumatic.  Eyes:     General: No scleral icterus.    Conjunctiva/sclera: Conjunctivae normal.     Pupils: Pupils are equal, round, and reactive to light.  Cardiovascular:     Rate and Rhythm: Normal rate and regular rhythm.  Pulmonary:     Effort: Pulmonary effort is normal.     Breath sounds: Normal breath sounds. No wheezing or rales.  Abdominal:     General: Bowel sounds are normal. There is no distension.     Palpations: Abdomen is soft.     Tenderness: There is no abdominal tenderness.  Musculoskeletal:        General: Tenderness present.     Cervical back: Normal range of motion and neck supple.     Lumbar back: Spasms and tenderness present. No swelling, edema, deformity or bony tenderness. Decreased range of motion. Positive right straight leg raise test. No scoliosis.     Comments: LS mild tenderness  Flex 90 deg  Ext- discomfort with even 5 deg  Lateral bend- nl  Gait-slow and guarded with occ spasm    Lymphadenopathy:     Cervical: No cervical adenopathy.  Skin:    General: Skin is warm and dry.     Coloration: Skin is pale.     Findings: No erythema or  rash.  Neurological:     Mental Status: She is alert.     Cranial Nerves: No cranial nerve deficit.     Sensory: No sensory deficit.     Motor: No atrophy or abnormal muscle tone.     Coordination: Coordination normal.     Deep Tendon Reflexes: Reflexes are normal and symmetric.     Comments: Pos right SLR for pain in leg  No focal weakness  No foot drop  Psychiatric:        Mood and Affect: Mood normal.           Assessment & Plan:   Problem List Items Addressed This Visit      Other   Low back pain - Primary    H/o degenerative disc dz (last MRI L5-S1) disc protrusion  Symptoms are worse now after wearing heels  Significant R radiculopathy  PT has not helped  Prednisone also not helped  Ref done to orthopedics for eval and tx  Recommend ice and heat and gentle stretching if tolerated  Tramadol and flexeril with caution of sedation        Relevant Medications   traMADol (ULTRAM) 50 MG tablet   cyclobenzaprine (FLEXERIL) 10 MG tablet   Other Relevant Orders   Ambulatory referral to Orthopedic Surgery

## 2020-06-23 NOTE — Assessment & Plan Note (Signed)
H/o degenerative disc dz (last MRI L5-S1) disc protrusion  Symptoms are worse now after wearing heels  Significant R radiculopathy  PT has not helped  Prednisone also not helped  Ref done to orthopedics for eval and tx  Recommend ice and heat and gentle stretching if tolerated  Tramadol and flexeril with caution of sedation

## 2020-07-02 ENCOUNTER — Encounter: Payer: Self-pay | Admitting: Radiology

## 2020-07-12 ENCOUNTER — Encounter: Payer: Self-pay | Admitting: Family Medicine

## 2020-08-08 ENCOUNTER — Encounter: Payer: Self-pay | Admitting: Family Medicine

## 2020-08-08 ENCOUNTER — Ambulatory Visit (INDEPENDENT_AMBULATORY_CARE_PROVIDER_SITE_OTHER)
Admission: RE | Admit: 2020-08-08 | Discharge: 2020-08-08 | Disposition: A | Discharge status: Home / Self Care | Payer: Managed Care, Other (non HMO) | Source: Ambulatory Visit | Attending: Family Medicine | Admitting: Family Medicine

## 2020-08-08 ENCOUNTER — Ambulatory Visit: Payer: Managed Care, Other (non HMO) | Admitting: Family Medicine

## 2020-08-08 ENCOUNTER — Other Ambulatory Visit: Payer: Self-pay

## 2020-08-08 DIAGNOSIS — M25531 Pain in right wrist: Secondary | ICD-10-CM | POA: Diagnosis not present

## 2020-08-08 NOTE — Progress Notes (Signed)
Subjective:    Patient ID: Rachael Russell, female    DOB: February 04, 1966, 54 y.o.   MRN: [id number]  This visit occurred during the SARS-CoV-2 public health emergency.  Safety protocols were in place, including screening questions prior to the visit, additional usage of staff PPE, and extensive cleaning of exam room while observing appropriate contact time as indicated for disinfecting solutions.    HPI Pt presents for R wrist pain   Wt Readings from Last 3 Encounters:  08/08/20 205 lb 6 oz (93.2 kg)  06/21/20 201 lb 2 oz (91.2 kg)  05/22/20 202 lb (91.6 kg)   34.18 kg/m  Nov 14th she injured her wrist - playing with dog throwing ball with a stick  The next day her wrist hurt quite badly  (difficult to use)  She started using heat and ice  Got a brace at [employer] that did not help  Then wrapped with ace bandage   Typing and using phone is the worst  Also with wrist extension (driving)  Does not hurt as much when doing nothing   Back and medial wrist are the worst  There was some swelling to start (and a bit in the evenings) No bruising  Fingers hurt late in evening and are swollen also   No pain lying in bed   No elbow pain   Avoids nsaids unless abs necessary   Patient Active Problem List   Diagnosis Date Noted  . Right wrist pain 08/08/2020  . Class 1 obesity due to excess calories with body mass index (BMI) of 33.0 to 33.9 in adult 05/08/2020  . Screening mammogram, encounter for 04/25/2019  . Dyspnea 10/31/2018  . Low back pain 08/08/2018  . Genetic testing 12/01/2017  . Family history of breast cancer   . Family history of prostate cancer   . Papilloma of left breast 07/09/2017  . Pedal edema 02/23/2017  . Vitamin D deficiency 04/26/2015  . Fatigue 11/06/2014  . Hemorrhoids, external 10/13/2013  . Cervical disc disorder with radiculopathy of cervical region 09/15/2013  . History of herpes zoster 03/07/2013  . 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  Routine general medical examination at a health care facility 05/11/2011  . Allergic rhinitis 12/19/2010  . Fibrocystic breast disease 12/19/2010  . GERD 04/14/2010  . SINUSITIS, CHRONIC FRONTAL 04/22/2007  . Hyperlipidemia 12/21/2006  . Essential hypertension 12/21/2006   Past Medical History:  Diagnosis Date  . Anemia   . Anxiety   . Family history of adverse reaction to anesthesia    mother has difficult intubation  . Family history of breast cancer   . Family history of prostate cancer   . Fibroadenoma of breast, left   . Fibrocystic breast, right   . GERD (gastroesophageal reflux disease)   . Hyperlipidemia   . Hypertension   . Irritable bowel syndrome (IBS)   . Menorrhagia   . Papilloma of left breast 07/09/2017  . Wears glasses    Past Surgical History:  Procedure Laterality Date  . BREAST DUCTAL SYSTEM EXCISION Left 11/18/2012   Procedure: EXCISION DUCTAL SYSTEM BREAST;  Surgeon: [name], MD;  Location: [facility];  Service: General;  Laterality: Left;  . BREAST LUMPECTOMY WITH NEEDLE LOCALIZATION Left 11/18/2012   Procedure: excise ductal system left breast. subarealar. left partial mastectomy with radiographic guidance;  Surgeon: [name], MD;  Location: [facility];  Service: General;  Laterality: Left;  excise ductal system left breast. subarealar.  left partial mastectomy with radiographic guidance  . BREAST LUMPECTOMY WITH NEEDLE LOCALIZATION Left 03/27/2015   Procedure: LEFT BREAST LUMPECTOMY  WITH NEEDLE LOCALIZATION TIMES TWO;  Surgeon: Fanny Skates, MD;  Location: [facility];  Service: General;  Laterality: Left;  . BREAST LUMPECTOMY WITH RADIOACTIVE SEED LOCALIZATION Left 07/09/2017   Procedure: LEFT BREAST LUMPECTOMY WITH RADIOACTIVE SEED LOCALIZATION;  Surgeon: Fanny Skates, MD;  Location: [facility];  Service: General;  Laterality: Left;  ERAS PATHWAY  . CESAREAN SECTION  [id number]  . DILATION AND  CURETTAGE OF UTERUS  1998  . DILITATION & CURRETTAGE/HYSTROSCOPY WITH NOVASURE ABLATION N/A 08/05/2016   Procedure: DILATATION & CURETTAGE/HYSTEROSCOPY WITH NOVASURE ABLATION;  Surgeon: Princess Bruins, MD;  Location: [facility];  Service: Gynecology;  Laterality: N/A;  . TONSILLECTOMY  age 34  . UMBILICAL HERNIA REPAIR  2006   Social History   Tobacco Use  . Smoking status: Never Smoker  . Smokeless tobacco: Never Used  Vaping Use  . Vaping Use: Never used  Substance Use Topics  . Alcohol use: Not Currently  . Drug use: No   Family History  Problem Relation Age of Onset  . Hypertension Mother   . Arthritis Mother        osteoarthritis  . Breast cancer Mother 83  . Hypertension Father   . Arthritis Father        osteoarthritis  . Breast cancer Maternal Grandmother 62       bilateral breast cancer  . Breast cancer Paternal Aunt 19       bilateral breast cancer  . Prostate cancer Paternal Uncle   . Tuberculosis Paternal Grandmother   . Prostate cancer Paternal Uncle    Allergies  Allergen Reactions  . Ferrous Sulfate     constipation  . Penicillins Other (See Comments)    Unknown childhood reaction  . Prilosec [Omeprazole Magnesium]     Dizziness   . Chlorhexidine Gluconate Rash   Current Outpatient Medications on File Prior to Visit  Medication Sig Dispense Refill  . amLODipine (NORVASC) 5 MG tablet TAKE 1 TABLET BY MOUTH  DAILY 90 tablet 1  . atorvastatin (LIPITOR) 10 MG tablet TAKE 1 TABLET BY MOUTH  DAILY 90 tablet 1  . Cholecalciferol 2000 units TABS Take 1 tablet by mouth daily.     . cyclobenzaprine (FLEXERIL) 10 MG tablet Take 1 tablet (10 mg total) by mouth 3 (three) times daily as needed for muscle spasms. Caution of sedation 90 tablet 1  . famotidine (PEPCID) 20 MG tablet Take 1 tablet (20 mg total) by mouth 2 (two) times daily as needed for heartburn or indigestion. 180 tablet 3  . ferrous sulfate 325 (65 FE) MG tablet Take 325 mg by mouth  daily with breakfast.    . hydrochlorothiazide (HYDRODIURIL) 25 MG tablet TAKE 1 TABLET BY MOUTH  DAILY 90 tablet 1  . lisinopril (ZESTRIL) 40 MG tablet TAKE 1 TABLET BY MOUTH  DAILY 90 tablet 3   No current facility-administered medications on file prior to visit.    Review of Systems  Constitutional: Negative for activity change, appetite change, fatigue, fever and unexpected weight change.  HENT: Negative for congestion, ear pain, rhinorrhea, sinus pressure and sore throat.   Eyes: Negative for pain, redness and visual disturbance.  Respiratory: Negative for cough, shortness of breath and wheezing.   Cardiovascular: Negative for chest pain and palpitations.  Gastrointestinal: Negative for abdominal pain, blood in stool, constipation and diarrhea.  Endocrine: Negative  for polydipsia and polyuria.  Genitourinary: Negative for dysuria, frequency and urgency.  Musculoskeletal: Negative for arthralgias, back pain and myalgias.       Right wrist and hand pain with mild swelling  Skin: Negative for pallor and rash.  Allergic/Immunologic: Negative for environmental allergies.  Neurological: Negative for dizziness, syncope and headaches.  Hematological: Negative for adenopathy. Does not bruise/bleed easily.  Psychiatric/Behavioral: Negative for decreased concentration and dysphoric mood. The patient is not nervous/anxious.        Objective:   Physical Exam Constitutional:      General: She is not in acute distress.    Appearance: Normal appearance. She is obese. She is not ill-appearing.  HENT:     Head: Normocephalic and atraumatic.  Eyes:     Conjunctiva/sclera: Conjunctivae normal.     Pupils: Pupils are equal, round, and reactive to light.  Cardiovascular:     Rate and Rhythm: Regular rhythm.     Pulses: Normal pulses.  Pulmonary:     Effort: Pulmonary effort is normal.  Musculoskeletal:        General: Tenderness present.     Right wrist: Swelling, tenderness and bony  tenderness present. No deformity or snuff box tenderness. Decreased range of motion. Normal pulse.     Right hand: Swelling, tenderness and bony tenderness present. Normal range of motion. Normal strength. Normal pulse.     Cervical back: Normal range of motion.     Comments: R hand-mild medial swelling with tenderness of 4th metacarpal and mcp joint   R wrist- pain to hyperextend  Neg finklesteins test  Tender over lateral wrist (more anteriorly)  No crepitus  Slight medial soft tissue swelling   Lymphadenopathy:     Cervical: No cervical adenopathy.  Neurological:     Mental Status: She is alert.     Sensory: No sensory deficit.     Motor: No weakness.  Psychiatric:        Mood and Affect: Mood normal.           Assessment & Plan:   Problem List Items Addressed This Visit      Other   Right wrist pain    S/p injury from throwing a ball a month ago  Mild swelling of medial wrist/hand and some tenderness  Adv to continue compression and elevation  Ice recommended as often as possible Xray ordered today  Tylenol prn  voltaren gel 1% recommended four times daily  Plan to follow rad review  Suspect tendon injury       Relevant Orders   DG Hand Complete Right   DG Wrist Complete Right

## 2020-08-08 NOTE — Patient Instructions (Addendum)
voltaren gel (diclofenac) 1% is over the counter  You can apply to the wrist and hand up to 4 times daily   Preference ice to heat  Tylenol is ok  Keep wrapping wrist and hand   Xray now   Avoid hyperextension and heavy lifting with right hand

## 2020-08-08 NOTE — Assessment & Plan Note (Signed)
S/p injury from throwing a ball a month ago  Mild swelling of medial wrist/hand and some tenderness  Adv to continue compression and elevation  Ice recommended as often as possible Xray ordered today  Tylenol prn  voltaren gel 1% recommended four times daily  Plan to follow rad review  Suspect tendon injury

## 2020-08-09 ENCOUNTER — Ambulatory Visit: Payer: Managed Care, Other (non HMO) | Admitting: Family Medicine

## 2020-08-11 ENCOUNTER — Telehealth: Payer: Self-pay | Admitting: Family Medicine

## 2020-08-11 DIAGNOSIS — M25531 Pain in right wrist: Secondary | ICD-10-CM

## 2020-08-11 NOTE — Telephone Encounter (Signed)
-----   Message from Rachael Russell, [place] sent at 08/09/2020  5:02 PM EST ----- Pt notified of xray results and verbalized understanding she is okay seeing specialist, would like to see someone in [place], I advise PCP will put referral in and our [facility] will call to schedule appt

## 2020-08-11 NOTE — Telephone Encounter (Signed)
Ref done  

## 2020-08-14 ENCOUNTER — Ambulatory Visit: Payer: Managed Care, Other (non HMO) | Admitting: Orthopaedic Surgery

## 2020-08-15 ENCOUNTER — Other Ambulatory Visit: Payer: Self-pay

## 2020-08-15 ENCOUNTER — Ambulatory Visit
Admission: EM | Admit: 2020-08-15 | Discharge: 2020-08-15 | Disposition: A | Discharge status: Home / Self Care | Payer: Managed Care, Other (non HMO) | Attending: Family Medicine | Admitting: Family Medicine

## 2020-08-15 ENCOUNTER — Telehealth: Payer: Self-pay

## 2020-08-15 DIAGNOSIS — N3000 Acute cystitis without hematuria: Secondary | ICD-10-CM

## 2020-08-15 LAB — URINALYSIS, COMPLETE (UACMP) WITH MICROSCOPIC
Bilirubin Urine: NEGATIVE
Glucose, UA: NEGATIVE mg/dL
Nitrite: POSITIVE — AB
Protein, ur: 30 mg/dL — AB
Specific Gravity, Urine: 1.025 (ref 1.005–1.030)
WBC, UA: >50 WBC/hpf (ref 0–5)
pH: 5.5 (ref 5.0–8.0)

## 2020-08-15 MED ORDER — FLUCONAZOLE 150 MG PO TABS: 150.0000 mg | ORAL_TABLET | Freq: Once | ORAL | 0 refills | Status: AC

## 2020-08-15 MED ORDER — CEPHALEXIN 500 MG PO CAPS: 500.0000 mg | ORAL_CAPSULE | Freq: Two times a day (BID) | ORAL | 0 refills | Status: DC

## 2020-08-15 NOTE — Telephone Encounter (Signed)
Aware, will watch for correspondence  

## 2020-08-15 NOTE — Telephone Encounter (Signed)
[facility] - Client TELEPHONE ADVICE RECORD [facility] Patient Name: Rachael Russell Gender: Female DOB: August 10, 1966 Age: 54 Y 11 M 23 D Return Phone Number: [phone] (Primary) Address: [address] City/State/Zip: [name] [facility] [address] Client [facility] - Client Client Site [place] Physician Glori Bickers, Roque Lias - MD Contact Type Call Who Is Calling Patient / Member / Family / Caregiver Call Type Triage / Clinical Relationship To Patient Self Return Phone Number [phone] (Primary) Chief Complaint Urination Frequency Reason for Call Symptomatic / Request for [place] states she might have a uti experiencing frequent urination. Translation No Nurse Assessment Nurse: Rock Nephew, RN, Juliann Pulse Date/Time (Eastern Time): 08/15/2020 9:51:21 AM Confirm and document reason for call. If symptomatic, describe symptoms. ---Caller states she might have a UTI, she is experiencing frequent urination. Does the patient have any new or worsening symptoms? ---Yes Will a triage be completed? ---Yes Related visit to physician within the last 2 weeks? ---No Does the PT have any chronic conditions? (i.e. diabetes, asthma, this includes High risk factors for pregnancy, etc.) ---Yes List chronic conditions. ---HTN Is the patient pregnant or possibly pregnant? (Ask all females between the ages of 81-55) ---No Is this a behavioral health or substance abuse call? ---No Guidelines Guideline Title Affirmed Question Affirmed Notes Nurse Date/Time (Eastern Time) Urinary Symptoms Urinating more frequently than usual (i.e., frequency) Rock Nephew, RN, Juliann Pulse 08/15/2020 9:51:53 AM Disp. Time Eilene Ghazi Time) Disposition Final User 08/15/2020 9:53:12 AM See PCP within 24 Hours Yes Rock Nephew, RN, Gara Kroner Disagree/Comply Comply Caller Understands Yes PLEASE NOTE: All timestamps contained within this  report are represented as [place] Standard Time. CONFIDENTIALTY NOTICE: This fax transmission is intended only for the addressee. It contains information that is legally privileged, confidential or otherwise protected from use or disclosure. If you are not the intended recipient, you are strictly prohibited from reviewing, disclosing, copying using or disseminating any of this information or taking any action in reliance on or regarding this information. If you have received this fax in error, please notify us immediately by telephone so that we can arrange for its return to [place]. Phone: [phone], Toll-Free: [phone], Fax: [phone] Page: 2 of 2 Call Id: [id number] PreDisposition Call Doctor Care Advice Given Per Guideline SEE PCP WITHIN 24 HOURS: * IF OFFICE WILL BE OPEN: You need to be examined within the next 24 hours. Call your doctor (or NP/PA) when the office opens and make an appointment. CALL BACK IF: CARE ADVICE given per Urinary Symptoms (Adult) guideline. * You become worse Comments User: Valetta Mole, RN Date/Time Eilene Ghazi Time): 08/15/2020 9:54:33 AM Caller stated she was told there are no office appts so I advised she go to [facility]. Referrals GO TO FACILITY UNDECIDED

## 2020-08-15 NOTE — Telephone Encounter (Signed)
[facility] - Client TELEPHONE ADVICE RECORD [facility] Patient Name: Rachael Russell Gender: Female DOB: 04/29/1966 Age: 53 Y 11 M 23 D Return Phone Number: [phone] (Primary) Address: [address] City/State/Zip: [place] [address] Client [facility] - Client Client Site [facility] Day Physician [place], [place] - MD Contact Type Call Who Is Calling Patient / Member / Family / Caregiver Call Type Triage / Clinical Relationship To Patient Self Return Phone Number [phone] (Primary) Chief Complaint Urination Frequency Reason for Call Symptomatic / Request for Health Information Initial Comment Caller states she might have a uti experiencing frequent urination. Translation No Nurse Assessment Nurse: Wells, RN, Kathy Date/Time (Eastern Time): 08/15/2020 9:51:21 AM Confirm and document reason for call. If symptomatic, describe symptoms. ---Caller states she might have a UTI, she is experiencing frequent urination. Does the patient have any new or worsening symptoms? ---Yes Will a triage be completed? ---Yes Related visit to physician within the last 2 weeks? ---No Does the PT have any chronic conditions? (i.e. diabetes, asthma, this includes High risk factors for pregnancy, etc.) ---Yes List chronic conditions. ---HTN Is the patient pregnant or possibly pregnant? (Ask all females between the ages of 12-55) ---No Is this a behavioral health or substance abuse call? ---No Guidelines Guideline Title Affirmed Question Affirmed Notes Nurse Date/Time (Eastern Time) Urinary Symptoms Urinating more frequently than usual (i.e., frequency) Wells, RN, Kathy 08/15/2020 9:51:53 AM Disp. Time (Eastern Time) Disposition Final User 08/15/2020 9:53:12 AM See PCP within 24 Hours Yes Wells, RN, Kathy Caller Disagree/Comply Comply Caller Understands Yes PLEASE NOTE: All timestamps contained within this  report are represented as Eastern Standard Time. CONFIDENTIALTY NOTICE: This fax transmission is intended only for the addressee. It contains information that is legally privileged, confidential or otherwise protected from use or disclosure. If you are not the intended recipient, you are strictly prohibited from reviewing, disclosing, copying using or disseminating any of this information or taking any action in reliance on or regarding this information. If you have received this fax in error, please notify us immediately by telephone so that we can arrange for its return to us. Phone: [phone], Toll-Free: [phone], Fax: [phone] Page: 2 of 2 Call Id: [id number] PreDisposition Call Doctor Care Advice Given Per Guideline SEE PCP WITHIN 24 HOURS: * IF OFFICE WILL BE OPEN: You need to be examined within the next 24 hours. Call your doctor (or NP/PA) when the office opens and make an appointment. CALL BACK IF: CARE ADVICE given per Urinary Symptoms (Adult) guideline. * You become worse Comments User: Kathy, Wells, RN Date/Time (Eastern Time): 08/15/2020 9:54:33 AM Caller stated she was told there are no office appts so I advised she go to [facility]. Referrals GO TO FACILITY UNDECIDED 

## 2020-08-15 NOTE — ED Triage Notes (Signed)
Patient complains of urinary urgency and frequency with pain and decreased output that started 2 days ago.

## 2020-08-15 NOTE — ED Provider Notes (Signed)
[facility]    CSN: [id number] Arrival date & time: 08/15/20  1317  History   Chief Complaint Chief Complaint  Patient presents with   Urinary Frequency   HPI  54 year old female presents with urinary symptoms.  2-day history of bladder pressure, urinary urgency and frequency.  No fever.  No flank pain.  No nausea or vomiting.  No relieving factors.  She is concerned that she has UTI.  No other reported symptoms.  No other complaints.  Past Medical History:  Diagnosis Date   Anemia    Anxiety    Family history of adverse reaction to anesthesia    mother has difficult intubation   Family history of breast cancer    Family history of prostate cancer    Fibroadenoma of breast, left    Fibrocystic breast, right    GERD (gastroesophageal reflux disease)    Hyperlipidemia    Hypertension    Irritable bowel syndrome (IBS)    Menorrhagia    Papilloma of left breast 07/09/2017   Wears glasses     Patient Active Problem List   Diagnosis Date Noted   Right wrist pain 08/08/2020   Class 1 obesity due to excess calories with body mass index (BMI) of 33.0 to 33.9 in adult 05/08/2020   Screening mammogram, encounter for 04/25/2019   Dyspnea 10/31/2018   Low back pain 08/08/2018   Genetic testing 12/01/2017   Family history of breast cancer    Family history of prostate cancer    Papilloma of left breast 07/09/2017   Pedal edema 02/23/2017   Vitamin D deficiency 04/26/2015   Fatigue 11/06/2014   Hemorrhoids, external 10/13/2013   Cervical disc disorder with radiculopathy of cervical region 09/15/2013   History of herpes zoster 03/07/2013   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   Routine general medical examination at a health care facility 05/11/2011   Allergic rhinitis 12/19/2010   Fibrocystic breast disease 12/19/2010   GERD 04/14/2010   SINUSITIS, CHRONIC FRONTAL 04/22/2007   Hyperlipidemia 12/21/2006   Essential  hypertension 12/21/2006    Past Surgical History:  Procedure Laterality Date   BREAST DUCTAL SYSTEM EXCISION Left 11/18/2012   Procedure: EXCISION DUCTAL SYSTEM BREAST;  Surgeon: Ernestene Mention, MD;  Location: [facility];  Service: General;  Laterality: Left;   BREAST LUMPECTOMY WITH NEEDLE LOCALIZATION Left 11/18/2012   Procedure: excise ductal system left breast. subarealar. left partial mastectomy with radiographic guidance;  Surgeon: Ernestene Mention, MD;  Location: [facility];  Service: General;  Laterality: Left;  excise ductal system left breast. subarealar. left partial mastectomy with radiographic guidance   BREAST LUMPECTOMY WITH NEEDLE LOCALIZATION Left 03/27/2015   Procedure: LEFT BREAST LUMPECTOMY  WITH NEEDLE LOCALIZATION TIMES TWO;  Surgeon: Claud Kelp, MD;  Location: [facility];  Service: General;  Laterality: Left;   BREAST LUMPECTOMY WITH RADIOACTIVE SEED LOCALIZATION Left 07/09/2017   Procedure: LEFT BREAST LUMPECTOMY WITH RADIOACTIVE SEED LOCALIZATION;  Surgeon: Claud Kelp, MD;  Location: [facility];  Service: General;  Laterality: Left;  ERAS PATHWAY   CESAREAN SECTION  5035,4656   DILATION AND CURETTAGE OF UTERUS  1998   DILITATION & CURRETTAGE/HYSTROSCOPY WITH NOVASURE ABLATION N/A 08/05/2016   Procedure: DILATATION & CURETTAGE/HYSTEROSCOPY WITH NOVASURE ABLATION;  Surgeon: Genia Del, MD;  Location: [facility];  Service: Gynecology;  Laterality: N/A;   TONSILLECTOMY  age 7   UMBILICAL HERNIA REPAIR  2006    OB History  Gravida  3   Para  2   Term  2   Preterm      AB  1   Living  2     SAB  1   IAB      Ectopic      Multiple      Live Births  2            Home Medications    Prior to Admission medications   Medication Sig Start Date End Date Taking? Authorizing Provider  amLODipine (NORVASC) 5 MG tablet TAKE 1 TABLET BY MOUTH  DAILY 04/24/20  Yes Tower, Marne  A, MD  atorvastatin (LIPITOR) 10 MG tablet TAKE 1 TABLET BY MOUTH  DAILY 04/24/20  Yes Tower, Wynelle Fanny, MD  Cholecalciferol 2000 units TABS Take 1 tablet by mouth daily.    Yes [provider]  famotidine (PEPCID) 20 MG tablet Take 1 tablet (20 mg total) by mouth 2 (two) times daily as needed for heartburn or indigestion. 06/21/20  Yes Tower, Wynelle Fanny, MD  hydrochlorothiazide (HYDRODIURIL) 25 MG tablet TAKE 1 TABLET BY MOUTH  DAILY 04/24/20  Yes Tower, Marne A, MD  lisinopril (ZESTRIL) 40 MG tablet TAKE 1 TABLET BY MOUTH  DAILY 05/02/20  Yes Tower, Marne A, MD  cephALEXin (KEFLEX) 500 MG capsule Take 1 capsule (500 mg total) by mouth 2 (two) times daily. 08/15/20   Coral Spikes, DO  fluconazole (DIFLUCAN) 150 MG tablet Take 1 tablet (150 mg total) by mouth once for 1 dose. Repeat dose in 72 hours. 08/15/20 08/15/20  Coral Spikes, DO    Family History Family History  Problem Relation Age of Onset   Hypertension Mother    Arthritis Mother        osteoarthritis   Breast cancer Mother 17   Hypertension Father    Arthritis Father        osteoarthritis   Breast cancer Maternal Grandmother 28       bilateral breast cancer   Breast cancer Paternal Aunt 32       bilateral breast cancer   Prostate cancer Paternal Uncle    Tuberculosis Paternal Grandmother    Prostate cancer Paternal Uncle     Social History Social History   Tobacco Use   Smoking status: Never Smoker   Smokeless tobacco: Never Used  Scientific laboratory technician Use: Never used  Substance Use Topics   Alcohol use: Not Currently   Drug use: No     Allergies   Ferrous sulfate, Penicillins, Prilosec [omeprazole magnesium], and Chlorhexidine gluconate   Review of Systems Review of Systems Per HPI  Physical Exam Triage Vital Signs ED Triage Vitals  Enc Vitals Group     BP 08/15/20 1337 130/87     Pulse Rate 08/15/20 1337 88     Resp 08/15/20 1337 18     Temp 08/15/20 1337 98.4 F (36.9 C)     Temp  Source 08/15/20 1337 Oral     SpO2 08/15/20 1337 100 %     Weight 08/15/20 1334 206 lb (93.4 kg)     Height 08/15/20 1334 5\' 5"  (1.651 m)     Head Circumference --      Peak Flow --      Pain Score 08/15/20 1334 0     Pain Loc --      Pain Edu? --      Excl. in [place]? --    Updated Vital Signs BP  130/87 (BP Location: Left Arm)    Pulse 88    Temp 98.4 F (36.9 C) (Oral)    Resp 18    Ht 5\' 5"  (1.651 m)    Wt 93.4 kg    SpO2 100%    BMI 34.28 kg/m   Visual Acuity Right Eye Distance:   Left Eye Distance:   Bilateral Distance:    Right Eye Near:   Left Eye Near:    Bilateral Near:     Physical Exam Vitals and nursing note reviewed.  Constitutional:      General: She is not in acute distress.    Appearance: Normal appearance. She is not ill-appearing.  HENT:     Head: Normocephalic and atraumatic.  Eyes:     General:        Right eye: No discharge.        Left eye: No discharge.     Conjunctiva/sclera: Conjunctivae normal.  Cardiovascular:     Rate and Rhythm: Normal rate and regular rhythm.     Heart sounds: No murmur heard.   Pulmonary:     Effort: Pulmonary effort is normal.     Breath sounds: Normal breath sounds. No wheezing, rhonchi or rales.  Abdominal:     General: There is no distension.     Palpations: Abdomen is soft.     Tenderness: There is no abdominal tenderness.  Neurological:     Mental Status: She is alert.  Psychiatric:        Mood and Affect: Mood normal.        Behavior: Behavior normal.    UC Treatments / Results  Labs (all labs ordered are listed, but only abnormal results are displayed) Labs Reviewed  URINALYSIS, COMPLETE (UACMP) WITH MICROSCOPIC - Abnormal; Notable for the following components:      Result Value   APPearance HAZY (*)    Hgb urine dipstick TRACE (*)    Ketones, ur TRACE (*)    Protein, ur 30 (*)    Nitrite POSITIVE (*)    Leukocytes,Ua SMALL (*)    Bacteria, UA MANY (*)    All other components within normal limits   URINE CULTURE    EKG   Radiology No results found.  Procedures Procedures (including critical care time)  Medications Ordered in UC Medications - No data to display  Initial Impression / Assessment and Plan / UC Course  I have reviewed the triage vital signs and the nursing notes.  Pertinent labs & imaging results that were available during my care of the patient were reviewed by me and considered in my medical decision making (see chart for details).    54 year old female presents with UTI.  Treating with Keflex.  Awaiting culture.  Patient requested Diflucan in case she develops yeast vaginitis.  Rx sent.  Final Clinical Impressions(s) / UC Diagnoses   Final diagnoses:  Acute cystitis without hematuria   Discharge Instructions   None    ED Prescriptions    Medication Sig Dispense Auth. Provider   fluconazole (DIFLUCAN) 150 MG tablet Take 1 tablet (150 mg total) by mouth once for 1 dose. Repeat dose in 72 hours. 2 tablet Teniola Tseng G, DO   cephALEXin (KEFLEX) 500 MG capsule Take 1 capsule (500 mg total) by mouth 2 (two) times daily. 14 capsule Thersa Salt G, DO     PDMP not reviewed this encounter.   Coral Spikes, [place] 08/15/20 2038

## 2020-08-15 NOTE — Telephone Encounter (Signed)
I spoke with pt; pt is having frequency of urine and when finishes urinating does not feel like she is emptying her bladder; no pain or burning upon urination; no available appts at [facility] this morning and pt is going to [facility] in [place]. FYI to Dr Glori Bickers.

## 2020-08-18 LAB — URINE CULTURE: Culture: >=100000 — AB

## 2020-08-28 ENCOUNTER — Ambulatory Visit: Payer: Managed Care, Other (non HMO) | Admitting: Orthopaedic Surgery

## 2020-09-12 ENCOUNTER — Ambulatory Visit: Payer: Managed Care, Other (non HMO) | Admitting: Orthopaedic Surgery

## 2020-09-12 ENCOUNTER — Encounter: Payer: Self-pay | Admitting: Orthopaedic Surgery

## 2020-09-12 ENCOUNTER — Other Ambulatory Visit: Payer: Self-pay

## 2020-09-12 DIAGNOSIS — M25531 Pain in right wrist: Secondary | ICD-10-CM | POA: Diagnosis not present

## 2020-09-12 MED ORDER — METHYLPREDNISOLONE 4 MG PO TBPK
ORAL_TABLET | ORAL | 0 refills | Status: DC
Start: 2020-09-12 — End: 2020-10-22

## 2020-09-12 NOTE — Progress Notes (Signed)
Office Visit Note   Patient: Rachael Russell           Date of Birth: August 16, 1966           MRN: [id number] Visit Date: 09/12/2020              Requested by: Tower, Wynelle Fanny, MD [place],  [place] [address] PCP: Abner Greenspan, MD   Assessment & Plan: Visit Diagnoses:  1. Pain in right wrist     Plan: Ms. Rockman appears to have extensor tenosynovitis of her right wrist.  This developed after repetitive activity throwing a ball to her dog on a stick about a month ago.  Definitely feeling better but still having difficulty with wrist motion and picking up any objects.  She is really not had any numbness or tingling and the pain is localized to the dorsum of the wrist.  Tried a wrist splint which was not helpful but will start a Medrol Dosepak and have her call me if no improvement over the next several weeks.  May continue with Voltaren gel  Follow-Up Instructions: Return if symptoms worsen or fail to improve.   Orders:  No orders of the defined types were placed in this encounter.  Meds ordered this encounter  Medications  . methylPREDNISolone (MEDROL DOSEPAK) 4 MG TBPK tablet    Sig: TAKE AS DIRECTED    Dispense:  21 tablet    Refill:  0      Procedures: No procedures performed   Clinical Data: No additional findings.   Subjective: Chief Complaint  Patient presents with  . Right Wrist - Pain  Onset of right wrist pain dorsally about 24 hours after repetitively throwing a ball on a stick to her dog.  No numbness or tingling.  Definitely feeling better but still having some swelling and pain in the dorsum of her wrist.  Has tried Voltaren gel which seems to help.  Occasionally has some pain referred along the more proximal arm.  No elbow pain.  Had x-rays performed through her primary care physician's office on 16 December.  These were reviewed and negative for any acute changes  HPI  Review of Systems   Objective: Vital Signs: There were no vitals  taken for this visit.  Physical Exam Constitutional:      Appearance: She is well-developed and well-nourished.  HENT:     Mouth/Throat:     Mouth: Oropharynx is clear and moist.  Eyes:     Extraocular Movements: EOM normal.     Pupils: Pupils are equal, round, and reactive to light.  Pulmonary:     Effort: Pulmonary effort is normal.  Skin:    General: Skin is warm and dry.  Neurological:     Mental Status: She is alert and oriented to person, place, and time.  Psychiatric:        Mood and Affect: Mood and affect normal.        Behavior: Behavior normal.     Ortho Exam right wrist with mild swelling.  There is pain diffusely along the extensor tendons with some pain with dorsiflexion of the wrist.  Little bit of pain to let the radiocarpal joint.  No skin changes.  Negative Finklestein's test.  No pain along the base of her thumb.  No pain over the distal radius or ulna medially or laterally.  Negative Tinel's over the median nerve. Specialty Comments:  No specialty comments available.  Imaging: No results  found.   PMFS History: Patient Active Problem List   Diagnosis Date Noted  . Pain in right wrist 09/12/2020  . Right wrist pain 08/08/2020  . Class 1 obesity due to excess calories with body mass index (BMI) of 33.0 to 33.9 in adult 05/08/2020  . Screening mammogram, encounter for 04/25/2019  . Dyspnea 10/31/2018  . Low back pain 08/08/2018  . Genetic testing 12/01/2017  . Family history of breast cancer   . Family history of prostate cancer   . Papilloma of left breast 07/09/2017  . Pedal edema 02/23/2017  . Vitamin D deficiency 04/26/2015  . Fatigue 11/06/2014  . Hemorrhoids, external 10/13/2013  . Cervical disc disorder with radiculopathy of cervical region 09/15/2013  . History of herpes zoster 03/07/2013  . 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 Routine general medical examination at a health care facility 05/11/2011  . Allergic rhinitis 12/19/2010   . Fibrocystic breast disease 12/19/2010  . GERD 04/14/2010  . SINUSITIS, CHRONIC FRONTAL 04/22/2007  . Hyperlipidemia 12/21/2006  . Essential hypertension 12/21/2006   Past Medical History:  Diagnosis Date  . Anemia   . Anxiety   . Family history of adverse reaction to anesthesia    mother has difficult intubation  . Family history of breast cancer   . Family history of prostate cancer   . Fibroadenoma of breast, left   . Fibrocystic breast, right   . GERD (gastroesophageal reflux disease)   . Hyperlipidemia   . Hypertension   . Irritable bowel syndrome (IBS)   . Menorrhagia   . Papilloma of left breast 07/09/2017  . Wears glasses     Family History  Problem Relation Age of Onset  . Hypertension Mother   . Arthritis Mother        osteoarthritis  . Breast cancer Mother 59  . Hypertension Father   . Arthritis Father        osteoarthritis  . Breast cancer Maternal Grandmother 62       bilateral breast cancer  . Breast cancer Paternal Aunt 33       bilateral breast cancer  . Prostate cancer Paternal Uncle   . Tuberculosis Paternal Grandmother   . Prostate cancer Paternal Uncle     Past Surgical History:  Procedure Laterality Date  . BREAST DUCTAL SYSTEM EXCISION Left 11/18/2012   Procedure: EXCISION DUCTAL SYSTEM BREAST;  Surgeon: [name], MD;  Location: [place];  Service: General;  Laterality: Left;  . BREAST LUMPECTOMY WITH NEEDLE LOCALIZATION Left 11/18/2012   Procedure: excise ductal system left breast. subarealar. left partial mastectomy with radiographic guidance;  Surgeon: [name], MD;  Location: [place];  Service: General;  Laterality: Left;  excise ductal system left breast. subarealar. left partial mastectomy with radiographic guidance  . BREAST LUMPECTOMY WITH NEEDLE LOCALIZATION Left 03/27/2015   Procedure: LEFT BREAST LUMPECTOMY  WITH NEEDLE LOCALIZATION TIMES TWO;  Surgeon: Fanny Skates, MD;  Location: [facility];  Service: General;  Laterality: Left;  . BREAST LUMPECTOMY WITH RADIOACTIVE SEED LOCALIZATION Left 07/09/2017   Procedure: LEFT BREAST LUMPECTOMY WITH RADIOACTIVE SEED LOCALIZATION;  Surgeon: Fanny Skates, MD;  Location: [place];  Service: General;  Laterality: Left;  ERAS PATHWAY  . CESAREAN SECTION  [id number]  . DILATION AND CURETTAGE OF UTERUS  1998  . DILITATION & CURRETTAGE/HYSTROSCOPY WITH NOVASURE ABLATION N/A 08/05/2016   Procedure: DILATATION & CURETTAGE/HYSTEROSCOPY WITH NOVASURE ABLATION;  Surgeon: Princess Bruins, MD;  Location: [facility];  Service: Gynecology;  Laterality: N/A;  . TONSILLECTOMY  age 4  . UMBILICAL HERNIA REPAIR  2006   Social History   Occupational History  . Occupation: Data processing manager    Comment: Water quality scientist- foster care  Tobacco Use  . Smoking status: Never Smoker  . Smokeless tobacco: Never Used  Vaping Use  . Vaping Use: Never used  Substance and Sexual Activity  . Alcohol use: Not Currently  . Drug use: No  . Sexual activity: Yes    Birth control/protection: Other-see comments, Surgical    Comment: vasectomy     Garald Balding, MD   Note - This record has been created using [employer].  Chart creation errors have been sought, but may not always  have been located. Such creation errors do not reflect on  the standard of medical care.

## 2020-10-08 ENCOUNTER — Other Ambulatory Visit: Payer: Self-pay | Admitting: Family Medicine

## 2020-10-09 ENCOUNTER — Other Ambulatory Visit: Payer: Self-pay | Admitting: Family Medicine

## 2020-10-22 ENCOUNTER — Encounter (INDEPENDENT_AMBULATORY_CARE_PROVIDER_SITE_OTHER): Payer: Self-pay | Admitting: Family Medicine

## 2020-10-22 ENCOUNTER — Other Ambulatory Visit: Payer: Self-pay

## 2020-10-22 ENCOUNTER — Ambulatory Visit (INDEPENDENT_AMBULATORY_CARE_PROVIDER_SITE_OTHER): Payer: Managed Care, Other (non HMO) | Admitting: Family Medicine

## 2020-10-22 VITALS — BP 129/87 | HR 73 | Temp 97.6°F | Ht 65.0 in | Wt 192.0 lb

## 2020-10-22 DIAGNOSIS — Z9189 Other specified personal risk factors, not elsewhere classified: Secondary | ICD-10-CM | POA: Diagnosis not present

## 2020-10-22 DIAGNOSIS — I10 Essential (primary) hypertension: Secondary | ICD-10-CM

## 2020-10-22 DIAGNOSIS — E7849 Other hyperlipidemia: Secondary | ICD-10-CM

## 2020-10-22 DIAGNOSIS — Z1331 Encounter for screening for depression: Secondary | ICD-10-CM | POA: Diagnosis not present

## 2020-10-22 DIAGNOSIS — Z6831 Body mass index (BMI) 31.0-31.9, adult: Secondary | ICD-10-CM | POA: Insufficient documentation

## 2020-10-22 DIAGNOSIS — Z0289 Encounter for other administrative examinations: Secondary | ICD-10-CM

## 2020-10-22 DIAGNOSIS — E559 Vitamin D deficiency, unspecified: Secondary | ICD-10-CM

## 2020-10-22 DIAGNOSIS — R5383 Other fatigue: Secondary | ICD-10-CM | POA: Diagnosis not present

## 2020-10-22 DIAGNOSIS — Z6832 Body mass index (BMI) 32.0-32.9, adult: Secondary | ICD-10-CM

## 2020-10-22 DIAGNOSIS — E6609 Other obesity due to excess calories: Secondary | ICD-10-CM | POA: Insufficient documentation

## 2020-10-22 DIAGNOSIS — D509 Iron deficiency anemia, unspecified: Secondary | ICD-10-CM

## 2020-10-22 DIAGNOSIS — R0602 Shortness of breath: Secondary | ICD-10-CM | POA: Diagnosis not present

## 2020-10-22 DIAGNOSIS — E669 Obesity, unspecified: Secondary | ICD-10-CM

## 2020-10-22 DIAGNOSIS — K219 Gastro-esophageal reflux disease without esophagitis: Secondary | ICD-10-CM

## 2020-10-23 LAB — LIPID PANEL
Chol/HDL Ratio: 2.9 ratio (ref 0.0–4.4)
Cholesterol, Total: 217 mg/dL — ABNORMAL HIGH (ref 100–199)
HDL: 76 mg/dL (ref >39)
LDL Chol Calc (NIH): 128 mg/dL — ABNORMAL HIGH (ref 0–99)
Triglycerides: 75 mg/dL (ref 0–149)
VLDL Cholesterol Cal: 13 mg/dL (ref 5–40)

## 2020-10-23 LAB — COMPREHENSIVE METABOLIC PANEL
ALT: 25 IU/L (ref 0–32)
AST: 20 IU/L (ref 0–40)
Albumin/Globulin Ratio: 1.9 (ref 1.2–2.2)
Albumin: 4.7 g/dL (ref 3.8–4.9)
Alkaline Phosphatase: 96 IU/L (ref 44–121)
BUN/Creatinine Ratio: 16 (ref 9–23)
BUN: 15 mg/dL (ref 6–24)
Bilirubin Total: 0.3 mg/dL (ref 0.0–1.2)
CO2: 20 mmol/L (ref 20–29)
Calcium: 9.9 mg/dL (ref 8.7–10.2)
Chloride: 103 mmol/L (ref 96–106)
Creatinine, Ser: 0.95 mg/dL (ref 0.57–1.00)
Globulin, Total: 2.5 g/dL (ref 1.5–4.5)
Glucose: 86 mg/dL (ref 65–99)
Potassium: 3.9 mmol/L (ref 3.5–5.2)
Sodium: 143 mmol/L (ref 134–144)
Total Protein: 7.2 g/dL (ref 6.0–8.5)
eGFR: 71 mL/min/{1.73_m2} (ref >59)

## 2020-10-23 LAB — CBC WITH DIFFERENTIAL/PLATELET
Basophils Absolute: 0 10*3/uL (ref 0.0–0.2)
Basos: 1 %
EOS (ABSOLUTE): 0.1 10*3/uL (ref 0.0–0.4)
Eos: 1 %
Hematocrit: 42.1 % (ref 34.0–46.6)
Hemoglobin: 14.4 g/dL (ref 11.1–15.9)
Immature Grans (Abs): 0 10*3/uL (ref 0.0–0.1)
Immature Granulocytes: 0 %
Lymphocytes Absolute: 2 10*3/uL (ref 0.7–3.1)
Lymphs: 26 %
MCH: 30.9 pg (ref 26.6–33.0)
MCHC: 34.2 g/dL (ref 31.5–35.7)
MCV: 90 fL (ref 79–97)
Monocytes Absolute: 0.5 10*3/uL (ref 0.1–0.9)
Monocytes: 6 %
Neutrophils Absolute: 5.2 10*3/uL (ref 1.4–7.0)
Neutrophils: 66 %
Platelets: 314 10*3/uL (ref 150–450)
RBC: 4.66 x10E6/uL (ref 3.77–5.28)
RDW: 12.1 % (ref 11.7–15.4)
WBC: 7.8 10*3/uL (ref 3.4–10.8)

## 2020-10-23 LAB — VITAMIN D 25 HYDROXY (VIT D DEFICIENCY, FRACTURES): Vit D, 25-Hydroxy: 46.4 ng/mL (ref 30.0–100.0)

## 2020-10-23 LAB — INSULIN, RANDOM: INSULIN: 11.3 u[IU]/mL (ref 2.6–24.9)

## 2020-10-23 LAB — T3: T3, Total: 135 ng/dL (ref 71–180)

## 2020-10-23 LAB — T4, FREE: Free T4: 1.24 ng/dL (ref 0.82–1.77)

## 2020-10-23 LAB — FOLATE: Folate: 10.4 ng/mL (ref >3.0)

## 2020-10-23 LAB — TSH: TSH: 1.63 u[IU]/mL (ref 0.450–4.500)

## 2020-10-23 LAB — HEMOGLOBIN A1C
Est. average glucose Bld gHb Est-mCnc: 120 mg/dL
Hgb A1c MFr Bld: 5.8 % — ABNORMAL HIGH (ref 4.8–5.6)

## 2020-10-23 LAB — VITAMIN B12: Vitamin B-12: 768 pg/mL (ref 232–1245)

## 2020-10-23 NOTE — Progress Notes (Signed)
Chief Complaint:   Rachael Russell (MR# [id number]) is a 55 y.o. female who presents for evaluation and treatment of obesity and related comorbidities. Current BMI is Body mass index is 31.95 kg/m. Rachael Russell has been struggling with her weight for many years and has been unsuccessful in either losing weight, maintaining weight loss, or reaching her healthy weight goal.  Rachael Russell is currently in the action stage of change and ready to dedicate time achieving and maintaining a healthier weight. Rachael Russell is interested in becoming our patient and working on intensive lifestyle modifications including (but not limited to) diet and exercise for weight loss.  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  She lives with her husband, Rachael Russell, and their 54 and 6 year old sons (in college).  Craves salty and sweet foods, especially in the evenings.  Rachael Russell's habits were reviewed today and are as follows: Her family eats meals together, her desired weight loss is 47 pounds, she started gaining excessive weight during the pandemic, her heaviest weight ever was 207 pounds, she craves sweets (chocolate), salty (chips), she skips breakfast sometimes, she is frequently drinking liquids with calories, she frequently makes poor food choices, she frequently eats larger portions than normal and she struggles with emotional eating.  Depression Screen Rachael Russell's Food and Mood (modified PHQ-9) score was 3.  Depression screen Rachael Russell 2/9 10/22/2020  Decreased Interest 0  Down, Depressed, Hopeless 0  PHQ - 2 Score 0  Altered sleeping 1  Tired, decreased energy 1  Change in appetite 1  Feeling bad or failure about yourself  0  Trouble concentrating 0  Moving slowly or fidgety/restless 0  Suicidal thoughts 0  PHQ-9 Score 3  Difficult doing work/chores Not difficult at all   Assessment/Plan:   1. Other fatigue Rachael Russell admits to daytime somnolence and reports waking up still tired. Patent has a history of symptoms of daytime  fatigue, morning fatigue, morning headache and snoring. Rachael Russell generally gets 6 or 7 hours of sleep per night, and states that she has poor quality sleep. Snoring is present. Apneic episodes are not present. Epworth Sleepiness Score is 11.  Rachael Russell does feel that her weight is causing her energy to be lower than it should be. Fatigue may be related to obesity, depression or many other causes. Labs will be ordered, and in the meanwhile, Rachael Russell will focus on self care including making healthy food choices, increasing physical activity and focusing on stress reduction.  - Vitamin B12 - CBC with Differential/Platelet - Comprehensive metabolic panel - Folate - Hemoglobin A1c - Insulin, random - Lipid panel - T3 - T4, free - TSH - VITAMIN D 25 Hydroxy (Vit-D Deficiency, Fractures)  2. SOBOE (shortness of breath on exertion) Rachael Russell notes increasing shortness of breath with exercising and seems to be worsening over time with weight gain. She notes getting out of breath sooner with activity than she used to. This has gotten worse recently. Rachael Russell denies shortness of breath at rest or orthopnea.  Rachael Russell does feel that she gets out of breath more easily that she used to when she exercises. Rachael Russell's shortness of breath appears to be obesity related and exercise induced. She has agreed to work on weight loss and gradually increase exercise to treat her exercise induced shortness of breath. Will continue to monitor closely.  - Vitamin B12 - CBC with Differential/Platelet - Comprehensive metabolic panel - Folate - Hemoglobin A1c - Insulin, random - Lipid panel - T3 - T4, free - TSH - VITAMIN D  25 Hydroxy (Vit-D Deficiency, Fractures)  3. Essential hypertension At goal. Medications: Norvasc 5 mg daily, HCTZ 25 mg daily, lisinopril 40 mg daily.   Plan: Avoid buying foods that are: processed, frozen, or prepackaged to avoid excess salt. We will continue to monitor closely alongside her PCP and/or  Specialist.  Regular follow up with PCP and specialists was also encouraged.   BP Readings from Last 3 Encounters:  10/22/20 129/87  08/15/20 130/87  08/08/20 (!) 132/92   - Vitamin B12 - CBC with Differential/Platelet - Comprehensive metabolic panel - Folate - Hemoglobin A1c - Insulin, random - Lipid panel - T3 - T4, free - TSH - VITAMIN D 25 Hydroxy (Vit-D Deficiency, Fractures)  4. Other hyperlipidemia Course: Not at goal. Lipid-lowering medications: Lipitor 10 mg daily.   Plan: Dietary changes: Increase soluble fiber, decrease simple carbohydrates, decrease saturated fat. Exercise changes: Moderate to vigorous-intensity aerobic activity 150 minutes per week or as tolerated. We will continue to monitor along with PCP/specialists as it pertains to her weight loss journey.  Will check labs today.  - Vitamin B12 - CBC with Differential/Platelet - Comprehensive metabolic panel - Folate - Hemoglobin A1c - Insulin, random - Lipid panel - T3 - T4, free - TSH - VITAMIN D 25 Hydroxy (Vit-D Deficiency, Fractures)  5. Gastroesophageal reflux disease, unspecified whether esophagitis present Rachael Russell is taking famotidine 20 mg daily for her GERD symptoms.    Plan:  Symptoms are controlled with current treatment.  Avoid triggers.  Continue medication.  We reviewed the diagnosis of GERD and importance of treatment. We discussed "red flag" symptoms and the importance of follow up if symptoms persisted despite treatment. We reviewed non-pharmacologic management of GERD symptoms: including: caffeine reduction, dietary changes, elevate HOB, NPO after supper, reduction of alcohol intake, tobacco cessation, and weight loss.  - Vitamin B12 - CBC with Differential/Platelet - Comprehensive metabolic panel - Folate - Hemoglobin A1c - Insulin, random - Lipid panel - T3 - T4, free - TSH - VITAMIN D 25 Hydroxy (Vit-D Deficiency, Fractures)  6. Iron deficiency anemia, unspecified iron  deficiency anemia type Rachael Russell has history of iron deficiency.  She says she was told by her PCP that she did not need to be on a supplement at her last physical.   Plan:  Will check labs today, as per below.  Nutrition: Iron-rich foods include dark leafy greens, red and white meats, eggs, seafood, and beans.  Certain foods and drinks prevent your body from absorbing iron properly. Avoid eating these foods in the same meal as iron-rich foods or with iron supplements. These foods include: coffee, black tea, and red wine; milk, dairy products, and foods that are high in calcium; beans and soybeans; whole grains. Constipation can be a side effect of iron supplementation. Increased Rachael and fiber intake are helpful. Rachael goal: > 2 liters/day. Fiber goal: > 25 grams/day.  - Vitamin B12 - CBC with Differential/Platelet - Comprehensive metabolic panel - Folate - Hemoglobin A1c - Insulin, random - Lipid panel - T3 - T4, free - TSH - VITAMIN D 25 Hydroxy (Vit-D Deficiency, Fractures)  7. Vitamin D deficiency Improving, but not optimized. Current vitamin D is 47.5, tested on 04/10/2020. Optimal goal > 50 ng/dL.  She is taking OTC vitamin D 1,000 IU daily.  Plan: Continue current OTC vitamin D supplementation.  Will check vitamin D level today.  - Vitamin B12 - CBC with Differential/Platelet - Comprehensive metabolic panel - Folate - Hemoglobin A1c - Insulin, random - Lipid  panel - T3 - T4, free - TSH - VITAMIN D 25 Hydroxy (Vit-D Deficiency, Fractures)  8. Depression screening Rachael Russell was screened for depression as part of her new patient workup today.  PHQ-9 is 3.  9. At risk for impaired metabolic function Due to Rachael Russell's current state of health and medical condition(s), she is at a significantly higher risk for impaired metabolic function.  This places the patient at a much greater risk to subsequently develop cardiopulmonary conditions that can negatively affect the patient's quality  of life.  At least 15 minutes was spent on counseling Rachael Russell about these concerns today, and I stressed the importance of reversing these risks factors.  The initial goal is to lose at least 5-10% of starting weight to help reduce risk factors.  Counseling:  Intensive lifestyle modifications discussed with Rachael Russell as the most appropriate first line treatment.  she will continue to work on diet, exercise, and weight loss efforts.  We will continue to reassess these conditions on a fairly regular basis in an attempt to decrease the patient's overall morbidity and mortality.  10. Class 1 obesity with serious comorbidity and body mass index (BMI) of 32.0 to 32.9 in adult, unspecified obesity type  Rachael Russell is currently in the action stage of change and her goal is to continue with weight loss efforts. I recommend Rachael Russell begin the structured treatment plan as follows:  She has agreed to the Category 2 Plan.  Exercise goals: As is.   Behavioral modification strategies: meal planning and cooking strategies and planning for success.  She was informed of the importance of frequent follow-up visits to maximize her success with intensive lifestyle modifications for her multiple health conditions. She was informed we would discuss her lab results at her next visit unless there is a critical issue that needs to be addressed sooner. Rachael Russell agreed to keep her next visit at the agreed upon time to discuss these results.  Objective:   Blood pressure 129/87, pulse 73, temperature 97.6 F (36.4 C), height 5\' 5"  (1.651 m), weight 192 lb (87.1 kg), SpO2 98 %. Body mass index is 31.95 kg/m.  Indirect Calorimeter completed today shows a VO2 of 281 and a REE of 1959.  Her calculated basal metabolic rate is 2774 thus her basal metabolic rate is better than expected.  General: Cooperative, alert, well developed, in no acute distress. HEENT: Conjunctivae and lids unremarkable. Cardiovascular: Regular rhythm.  Lungs:  Normal work of breathing. Neurologic: No focal deficits.   Attestation Statements:   This is the patient's first visit at [facility]. The patient's NEW PATIENT PACKET was reviewed at length. Included in the packet: current and past health history, medications, allergies, ROS, gynecologic history (women only), surgical history, family history, social history, weight history, weight loss surgery history (for those that have had weight loss surgery), nutritional evaluation, mood and food questionnaire, PHQ9, Epworth questionnaire, sleep habits questionnaire, patient life and health improvement goals questionnaire. These will all be scanned into the patient's chart under media.   During the visit, I independently reviewed the patient's EKG, bioimpedance scale results, and indirect calorimeter results. I used this information to tailor a meal plan for the patient that will help her to lose weight and will improve her obesity-related conditions going forward. I performed a medically necessary appropriate examination and/or evaluation. I discussed the assessment and treatment plan with the patient. The patient was provided an opportunity to ask questions and all were answered. The patient agreed with the plan  and demonstrated an understanding of the instructions. Labs were ordered at this visit and will be reviewed at the next visit unless more critical results need to be addressed immediately. Clinical information was updated and documented in the EMR.   I, Rachael Russell, CMA, am acting as Location manager for [employer], DO.  I have reviewed the above documentation for accuracy and completeness, and I agree with the above. -Rachael Russell, D.O.  The [place] was signed into law in 2016 which includes the topic of electronic health records.  This provides immediate access to information in MyChart.  This includes consultation notes, operative notes, office notes, lab  results and pathology reports.  If you have any questions about what you read please let us know at your next visit so we can discuss your concerns and take corrective action if need be.  We are right here with you.

## 2020-11-05 ENCOUNTER — Ambulatory Visit (INDEPENDENT_AMBULATORY_CARE_PROVIDER_SITE_OTHER): Payer: Managed Care, Other (non HMO) | Admitting: Family Medicine

## 2020-11-05 ENCOUNTER — Other Ambulatory Visit: Payer: Self-pay

## 2020-11-05 ENCOUNTER — Encounter (INDEPENDENT_AMBULATORY_CARE_PROVIDER_SITE_OTHER): Payer: Self-pay | Admitting: Family Medicine

## 2020-11-05 VITALS — BP 133/90 | HR 81 | Temp 98.1°F | Ht 65.0 in | Wt 192.0 lb

## 2020-11-05 DIAGNOSIS — R7303 Prediabetes: Secondary | ICD-10-CM

## 2020-11-05 DIAGNOSIS — E7849 Other hyperlipidemia: Secondary | ICD-10-CM | POA: Diagnosis not present

## 2020-11-05 DIAGNOSIS — Z6832 Body mass index (BMI) 32.0-32.9, adult: Secondary | ICD-10-CM

## 2020-11-05 DIAGNOSIS — E559 Vitamin D deficiency, unspecified: Secondary | ICD-10-CM

## 2020-11-05 DIAGNOSIS — E66811 Obesity, class 1: Secondary | ICD-10-CM

## 2020-11-05 DIAGNOSIS — Z9189 Other specified personal risk factors, not elsewhere classified: Secondary | ICD-10-CM

## 2020-11-05 DIAGNOSIS — I10 Essential (primary) hypertension: Secondary | ICD-10-CM | POA: Diagnosis not present

## 2020-11-05 DIAGNOSIS — E669 Obesity, unspecified: Secondary | ICD-10-CM

## 2020-11-05 DIAGNOSIS — D509 Iron deficiency anemia, unspecified: Secondary | ICD-10-CM

## 2020-11-13 NOTE — Progress Notes (Signed)
Chief Complaint:   OBESITY Rachael Russell is here to discuss her progress with her obesity treatment plan along with follow-up of her obesity related diagnoses.   Today's visit was #: 2 Starting weight: 192 lbs Starting date: 10/22/2020 Today's weight: 192 lbs Today's date: 11/05/2020 Total lbs lost to date: 0 Body mass index is 31.95 kg/m.   Interim History:  Rachael Russell is here today for her first follow-up office visit since starting the program with [place].  We will be reviewing her NEW Meal Plan and will be discussing all recent labs done here and/ or done at outside facilities.  Extended time was spent counseling Rachael Russell on all new disease processes that were discovered or that are worsening.    She is following the meal plan with only minor concerns/ questions today.  Patient's meal and food recall appears to be accurate and consistent with what is on the plan when she is following it.  When on plan, her hunger and cravings are well controlled.    [name] says that her first week went well but she did not weigh lunch proteins and felt hungry.  Current Meal Plan: the Category 2 Plan for 50% of the time.  Current Exercise Plan: Going to the gym for 50% of the time.  Assessment/Plan:  \ 1. Essential hypertension Slightly elevated today.  Medications: Norvasc 5 mg daily, HCTZ 25 mg daily, lisinopril 40 mg daily, potassium 99 mg daily.   Plan:  Discussed labs with patient today.  Avoid buying foods that are: processed, frozen, or prepackaged to avoid excess salt. We will continue to monitor closely alongside her PCP and/or Specialist.  Regular follow up with PCP and specialists was also encouraged.   BP Readings from Last 3 Encounters:  11/05/20 133/90  10/22/20 129/87  08/15/20 130/87   Lab Results  Component Value Date   CREATININE 0.95 10/22/2020   2. Other hyperlipidemia Course: Not at goal. Lipid-lowering medications: Lipitor 10 mg daily.   Plan:  Discussed labs  with patient today.  Dietary changes: Increase soluble fiber, decrease simple carbohydrates, decrease saturated fat. Exercise changes: Moderate to vigorous-intensity aerobic activity 150 minutes per week or as tolerated. We will continue to monitor along with PCP/specialists as it pertains to her weight loss journey.  Lab Results  Component Value Date   CHOL 217 (H) 10/22/2020   HDL 76 10/22/2020   LDLCALC 128 (H) 10/22/2020   LDLDIRECT 149.9 05/11/2011   TRIG 75 10/22/2020   CHOLHDL 2.9 10/22/2020   Lab Results  Component Value Date   ALT 25 10/22/2020   AST 20 10/22/2020   ALKPHOS 96 10/22/2020   BILITOT 0.3 10/22/2020   The 10-year ASCVD risk score Rachael Bussing DC Jr., et al., 2013) is: 4.4%   Values used to calculate the score:     Age: 55 years     Sex: Female     Is Non-Hispanic African American: Yes     Diabetic: No     Tobacco smoker: No     Systolic Blood Pressure: 226 mmHg     Is BP treated: Yes     HDL Cholesterol: 76 mg/dL     Total Cholesterol: 217 mg/dL  3. Vitamin D deficiency Improving, but not optimized. Current vitamin D is 46.4, tested on 10/22/2020. Optimal goal > 50 ng/dL.  She is taking OTC vitamin D 2,000 IU daily.  Plan:  Discussed labs with patient today.  Continue current OTC vitamin D supplementation.  Follow-up for routine testing of Vitamin D, at least 2-3 times per year to avoid over-replacement.  4. Prediabetes At goal. Goal is HgbA1c < 5.7.  Medication: None.    Plan:  She will continue to focus on protein-rich, low simple carbohydrate foods. We reviewed the importance of hydration, regular exercise for stress reduction, and restorative sleep.   Lab Results  Component Value Date   HGBA1C 5.8 (H) 10/22/2020   Lab Results  Component Value Date   INSULIN 11.3 10/22/2020   5. Iron deficiency anemia, unspecified iron deficiency anemia type Rachael Russell is not on an iron supplement.  Nutrition: Iron-rich foods include dark leafy greens, red and white meats,  eggs, seafood, and beans.  Certain foods and drinks prevent your body from absorbing iron properly. Avoid eating these foods in the same meal as iron-rich foods or with iron supplements. These foods include: coffee, black tea, and red wine; milk, dairy products, and foods that are high in calcium; beans and soybeans; whole grains. Constipation can be a side effect of iron supplementation. Increased water and fiber intake are helpful. Water goal: > 2 liters/day. Fiber goal: > 25 grams/day.  Plan:  Discussed labs with patient today.  Stable CBC/within normal limits.  No need for iron supplement at this time.  CBC Latest Ref Rng & Units 10/22/2020 05/22/2020 04/10/2020  WBC 3.4 - 10.8 x10E3/uL 7.8 8.9 7.6  Hemoglobin 11.1 - 15.9 g/dL 14.4 14.0 13.9  Hematocrit 34.0 - 46.6 % 42.1 40.8 41.0  Platelets 150 - 450 x10E3/uL 314 316 310   Lab Results  Component Value Date   FERRITIN 88 04/28/2017   Lab Results  Component Value Date   VITAMINB12 768 10/22/2020   6. At risk for deficient intake of food Rachael Russell was given extensive education and counseling today of more than 23 minutes on risks associated with deficient food intake.  She is at risk due to inadequate protein intake and occasionally skipping meals.  Counseled her on the importance of following our prescribed meal plan and eating adequate amounts of protein.  Discussed with Rachael Russell that inadequate food intake over longer periods of time can slow their metabolism down significantly.   7. Class 1 obesity with serious comorbidity and body mass index (BMI) of 32.0 to 32.9 in adult, unspecified obesity type  Course: Rachael Russell is currently in the action stage of change. As such, her goal is to continue with weight loss efforts.   Nutrition goals: She has agreed to the Category 2 Plan.   Exercise goals: As is.  Behavioral modification strategies: increasing lean protein intake, decreasing simple carbohydrates, no skipping meals, keeping healthy  foods in the home, avoiding temptations and planning for success.  Rachael Russell has agreed to follow-up with our clinic in 2 and 4 weeks. She was informed of the importance of frequent follow-up visits to maximize her success with intensive lifestyle modifications for her multiple health conditions.   Objective:   Blood pressure 133/90, pulse 81, temperature 98.1 F (36.7 C), height 5\' 5"  (1.651 m), weight 192 lb (87.1 kg), SpO2 99 %. Body mass index is 31.95 kg/m.  General: Cooperative, alert, well developed, in no acute distress. HEENT: Conjunctivae and lids unremarkable. Cardiovascular: Regular rhythm.  Lungs: Normal work of breathing. Neurologic: No focal deficits.   Lab Results  Component Value Date   CREATININE 0.95 10/22/2020   BUN 15 10/22/2020   NA 143 10/22/2020   K 3.9 10/22/2020   CL 103 10/22/2020   CO2  20 10/22/2020   Lab Results  Component Value Date   ALT 25 10/22/2020   AST 20 10/22/2020   ALKPHOS 96 10/22/2020   BILITOT 0.3 10/22/2020   Lab Results  Component Value Date   HGBA1C 5.8 (H) 10/22/2020   Lab Results  Component Value Date   INSULIN 11.3 10/22/2020   Lab Results  Component Value Date   TSH 1.630 10/22/2020   Lab Results  Component Value Date   CHOL 217 (H) 10/22/2020   HDL 76 10/22/2020   LDLCALC 128 (H) 10/22/2020   LDLDIRECT 149.9 05/11/2011   TRIG 75 10/22/2020   CHOLHDL 2.9 10/22/2020   Lab Results  Component Value Date   WBC 7.8 10/22/2020   HGB 14.4 10/22/2020   HCT 42.1 10/22/2020   MCV 90 10/22/2020   PLT 314 10/22/2020   Lab Results  Component Value Date   FERRITIN 88 04/28/2017   Attestation Statements:   Reviewed by clinician on day of visit: allergies, medications, problem list, medical history, surgical history, family history, social history, and previous encounter notes.  I, Water quality scientist, CMA, am acting as Location manager for [employer], DO.  I have reviewed the above documentation for accuracy and  completeness, and I agree with the above. Marjory Sneddon, D.O.  The [place] was signed into law in 2016 which includes the topic of electronic health records.  This provides immediate access to information in MyChart.  This includes consultation notes, operative notes, office notes, lab results and pathology reports.  If you have any questions about what you read please let us know at your next visit so we can discuss your concerns and take corrective action if need be.  We are right here with you.

## 2020-11-14 ENCOUNTER — Other Ambulatory Visit: Payer: Self-pay

## 2020-11-14 ENCOUNTER — Encounter: Payer: Self-pay | Admitting: Family Medicine

## 2020-11-14 ENCOUNTER — Ambulatory Visit (INDEPENDENT_AMBULATORY_CARE_PROVIDER_SITE_OTHER): Payer: Managed Care, Other (non HMO) | Admitting: Family Medicine

## 2020-11-14 VITALS — BP 119/78 | HR 74 | Ht 65.0 in | Wt 193.0 lb

## 2020-11-14 DIAGNOSIS — Z124 Encounter for screening for malignant neoplasm of cervix: Secondary | ICD-10-CM | POA: Diagnosis not present

## 2020-11-14 DIAGNOSIS — Z780 Asymptomatic menopausal state: Secondary | ICD-10-CM | POA: Diagnosis not present

## 2020-11-14 DIAGNOSIS — Z87440 Personal history of urinary (tract) infections: Secondary | ICD-10-CM

## 2020-11-14 DIAGNOSIS — Z01419 Encounter for gynecological examination (general) (routine) without abnormal findings: Secondary | ICD-10-CM | POA: Diagnosis not present

## 2020-11-14 LAB — POCT URINALYSIS DIPSTICK
Blood, UA: NEGATIVE
Leukocytes, UA: NEGATIVE

## 2020-11-14 NOTE — Assessment & Plan Note (Signed)
Advised of HRT risks and benefits.  Discussed bone loss, heart health, breast cancer risk and Alzeheimer's.  Also discussed changes related to menopause, normal progression and resolution of symptoms. Given her FH of breast cancer, she declines HRT. Discussed SSRI like Effexor. She will continue lifestyle changes and add black cohash.

## 2020-11-14 NOTE — Patient Instructions (Signed)
Preventive Care 84-56 Years Old, Female Preventive care refers to lifestyle choices and visits with your health care provider that can promote health and wellness. This includes:  A yearly physical exam. This is also called an annual wellness visit.  Regular dental and eye exams.  Immunizations.  Screening for certain conditions.  Healthy lifestyle choices, such as: ? Eating a healthy diet. ? Getting regular exercise. ? Not using drugs or products that contain nicotine and tobacco. ? Limiting alcohol use. What can I expect for my preventive care visit? Physical exam Your health care provider will check your:  Height and weight. These may be used to calculate your BMI (body mass index). BMI is a measurement that tells if you are at a healthy weight.  Heart rate and blood pressure.  Body temperature.  Skin for abnormal spots. Counseling Your health care provider may ask you questions about your:  Past medical problems.  Family's medical history.  Alcohol, tobacco, and drug use.  Emotional well-being.  Home life and relationship well-being.  Sexual activity.  Diet, exercise, and sleep habits.  Work and work Statistician.  Access to firearms.  Method of birth control.  Menstrual cycle.  Pregnancy history. What immunizations do I need? Vaccines are usually given at various ages, according to a schedule. Your health care provider will recommend vaccines for you based on your age, medical history, and lifestyle or other factors, such as travel or where you work.   What tests do I need? Blood tests  Lipid and cholesterol levels. These may be checked every 5 years, or more often if you are over 3 years old.  Hepatitis C test.  Hepatitis B test. Screening  Lung cancer screening. You may have this screening every year starting at age 73 if you have a 30-pack-year history of smoking and currently smoke or have quit within the past 15 years.  Colorectal cancer  screening. ? All adults should have this screening starting at age 52 and continuing until age 17. ? Your health care provider may recommend screening at age 49 if you are at increased risk. ? You will have tests every 1-10 years, depending on your results and the type of screening test.  Diabetes screening. ? This is done by checking your blood sugar (glucose) after you have not eaten for a while (fasting). ? You may have this done every 1-3 years.  Mammogram. ? This may be done every 1-2 years. ? Talk with your health care provider about when you should start having regular mammograms. This may depend on whether you have a family history of breast cancer.  BRCA-related cancer screening. This may be done if you have a family history of breast, ovarian, tubal, or peritoneal cancers.  Pelvic exam and Pap test. ? This may be done every 3 years starting at age 10. ? Starting at age 11, this may be done every 5 years if you have a Pap test in combination with an HPV test. Other tests  STD (sexually transmitted disease) testing, if you are at risk.  Bone density scan. This is done to screen for osteoporosis. You may have this scan if you are at high risk for osteoporosis. Talk with your health care provider about your test results, treatment options, and if necessary, the need for more tests. Follow these instructions at home: Eating and drinking  Eat a diet that includes fresh fruits and vegetables, whole grains, lean protein, and low-fat dairy products.  Take vitamin and mineral supplements  as recommended by your health care provider.  Do not drink alcohol if: ? Your health care provider tells you not to drink. ? You are pregnant, may be pregnant, or are planning to become pregnant.  If you drink alcohol: ? Limit how much you have to 0-1 drink a day. ? Be aware of how much alcohol is in your drink. In the [place] one drink equals one 12 oz bottle of beer (355 mL), one 5 oz glass of  wine (148 mL), or one 1 oz glass of hard liquor (44 mL).   Lifestyle  Take daily care of your teeth and gums. Brush your teeth every morning and night with fluoride toothpaste. Floss one time each day.  Stay active. Exercise for at least 30 minutes 5 or more days each week.  Do not use any products that contain nicotine or tobacco, such as cigarettes, e-cigarettes, and chewing tobacco. If you need help quitting, ask your health care provider.  Do not use drugs.  If you are sexually active, practice safe sex. Use a condom or other form of protection to prevent STIs (sexually transmitted infections).  If you do not wish to become pregnant, use a form of birth control. If you plan to become pregnant, see your health care provider for a prepregnancy visit.  If told by your health care provider, take low-dose aspirin daily starting at age 50.  Find healthy ways to cope with stress, such as: ? Meditation, yoga, or listening to music. ? Journaling. ? Talking to a trusted person. ? Spending time with friends and family. Safety  Always wear your seat belt while driving or riding in a vehicle.  Do not drive: ? If you have been drinking alcohol. Do not ride with someone who has been drinking. ? When you are tired or distracted. ? While texting.  Wear a helmet and other protective equipment during sports activities.  If you have firearms in your house, make sure you follow all gun safety procedures. What's next?  Visit your health care provider once a year for an annual wellness visit.  Ask your health care provider how often you should have your eyes and teeth checked.  Stay up to date on all vaccines. This information is not intended to replace advice given to you by your health care provider. Make sure you discuss any questions you have with your health care provider. Document Revised: 05/14/2020 Document Reviewed: 04/21/2018 Elsevier Patient Education  2021 Elsevier Inc.  

## 2020-11-14 NOTE — Progress Notes (Signed)
Having hot flashes  Wants to [facility] from UTI in December, denies any symptoms

## 2020-11-14 NOTE — Progress Notes (Signed)
  Subjective:     Rachael Russell is a 55 y.o. female and is here for a comprehensive physical exam. The patient reports no problems. Had UTI in December and taking something for urinary tract health. Wants to check her urine. Also with worsening hot flashes, night sweats. Not sleeing well. She reports freezing husband out of house. She is doing some lifestyle changes. Has strong f/h of breast cancer.  The following portions of the patient's history were reviewed and updated as appropriate: allergies, current medications, past family history, past medical history, past social history, past surgical history and problem list.  Review of Systems Pertinent items noted in HPI and remainder of comprehensive ROS otherwise negative.   Objective:    BP 119/78   Pulse 74   Ht 5\' 5"  (1.651 m)   Wt 193 lb (87.5 kg)   LMP 04/25/2019   BMI 32.12 kg/m  General appearance: alert, cooperative and appears stated age Head: Normocephalic, without obvious abnormality, atraumatic Neck: no adenopathy, supple, symmetrical, trachea midline and thyroid not enlarged, symmetric, no tenderness/mass/nodules Lungs: clear to auscultation bilaterally Breasts: normal appearance, no masses or tenderness, left breast with well healed scar, bilateral fibrocystic change Heart: regular rate and rhythm, S1, S2 normal, no murmur, click, rub or gallop Abdomen: soft, non-tender; bowel sounds normal; no masses,  no organomegaly Pelvic: cervix normal in appearance, external genitalia normal, no adnexal masses or tenderness, no cervical motion tenderness, uterus normal size, shape, and consistency, vagina normal without discharge and uterus is retroverted Extremities: Homans sign is negative, no sign of DVT Pulses: 2+ and symmetric Skin: Skin color, texture, turgor normal. No rashes or lesions Lymph nodes: Cervical, supraclavicular, and axillary nodes normal. Neurologic: Grossly normal    U/a negative  Assessment:    Healthy  female exam.      Plan:   Problem List Items Addressed This Visit      Unprioritized   Menopause    Advised of HRT risks and benefits.  Discussed bone loss, heart health, breast cancer risk and Alzeheimer's.  Also discussed changes related to menopause, normal progression and resolution of symptoms. Given her FH of breast cancer, she declines HRT. Discussed SSRI like Effexor. She will continue lifestyle changes and add black cohash.         Other Visit Diagnoses    Encounter for gynecological examination without abnormal finding    -  Primary   Screening for malignant neoplasm of cervix       Relevant Orders   IGP, Aptima HPV, rfx 16/18,45   History of urinary tract infection       normal office u/a, will check culture   Relevant Orders   POCT Urinalysis Dipstick   Urine Culture     Return in 1 year (on 11/14/2021).    See After Visit Summary for Counseling Recommendations

## 2020-11-17 LAB — URINE CULTURE: Organism ID, Bacteria: NO GROWTH

## 2020-11-19 ENCOUNTER — Ambulatory Visit (INDEPENDENT_AMBULATORY_CARE_PROVIDER_SITE_OTHER): Payer: Managed Care, Other (non HMO) | Admitting: Family Medicine

## 2020-11-19 ENCOUNTER — Encounter (INDEPENDENT_AMBULATORY_CARE_PROVIDER_SITE_OTHER): Payer: Self-pay | Admitting: Family Medicine

## 2020-11-19 ENCOUNTER — Other Ambulatory Visit: Payer: Self-pay

## 2020-11-19 VITALS — BP 121/72 | HR 84 | Temp 98.1°F | Ht 65.0 in | Wt 188.0 lb

## 2020-11-19 DIAGNOSIS — Z9189 Other specified personal risk factors, not elsewhere classified: Secondary | ICD-10-CM | POA: Diagnosis not present

## 2020-11-19 DIAGNOSIS — E669 Obesity, unspecified: Secondary | ICD-10-CM | POA: Diagnosis not present

## 2020-11-19 DIAGNOSIS — K219 Gastro-esophageal reflux disease without esophagitis: Secondary | ICD-10-CM | POA: Diagnosis not present

## 2020-11-19 DIAGNOSIS — Z6831 Body mass index (BMI) 31.0-31.9, adult: Secondary | ICD-10-CM

## 2020-11-19 LAB — IGP, APTIMA HPV, RFX 16/18,45
HPV Aptima: NEGATIVE
PAP Smear Comment: 0

## 2020-11-19 MED ORDER — FAMOTIDINE 20 MG PO TABS: 20.0000 mg | ORAL_TABLET | Freq: Two times a day (BID) | ORAL | 0 refills | Status: DC | PRN

## 2020-11-28 NOTE — Progress Notes (Signed)
Chief Complaint:   OBESITY Rachael Russell is here to discuss her progress with her obesity treatment plan along with follow-up of her obesity related diagnoses.   Today's visit was #: 3 Starting weight: 192 lbs Starting date: 10/22/2020 Today's weight: 188 lbs Today's date: 11/19/2020 Total lbs lost to date: 4 lbs Body mass index is 31.28 kg/m.  Total weight loss percentage to date: -2.08%  Interim History:  Rachael Russell has lost 4 pounds since her last office visit.  Rachael Russell is here for a follow up office visit and she is following the meal plan without concerns or issues.  Patient's meal and food recall appears to be accurate and consistent with what is on the plan.  When on plan, her hunger and cravings are well controlled.    Current Meal Plan: the Category 2 Plan for 100% of the time.  Current Exercise Plan: Going to the gym for 60 minutes 3-4 times per week.  Assessment/Plan:   No orders of the defined types were placed in this encounter.   Medications Discontinued During This Encounter  Medication Reason  . famotidine (PEPCID) 20 MG tablet Reorder     Meds ordered this encounter  Medications  . famotidine (PEPCID) 20 MG tablet    Sig: Take 1 tablet (20 mg total) by mouth 2 (two) times daily as needed for heartburn or indigestion.    Dispense:  60 tablet    Refill:  0    Needs OV for RF     1. Gastroesophageal reflux disease, unspecified whether esophagitis present Rachael Russell is taking famotidine 20 mg twice daily for GERD.  Plan:  Will refill famotidine today, as per below.  We reviewed the diagnosis of GERD and importance of treatment. We discussed "red flag" symptoms and the importance of follow up if symptoms persisted despite treatment. We reviewed non-pharmacologic management of GERD symptoms: including: caffeine reduction, dietary changes, elevate HOB, NPO after supper, reduction of alcohol intake, tobacco cessation, and weight loss.  - Refill famotidine  (PEPCID) 20 MG tablet; Take 1 tablet (20 mg total) by mouth 2 (two) times daily as needed for heartburn or indigestion.  Dispense: 60 tablet; Refill: 0  2. At risk for impaired metabolic function Due to Rachael Russell's current state of health and medical condition(s), she is at a significantly higher risk for impaired metabolic function.   At least 9 minutes was spent on counseling Rachael Russell about these concerns today.  This places the patient at a much greater risk to subsequently develop cardio-pulmonary conditions that can negatively affect the patient's quality of life.  I stressed the importance of reversing these risks factors.  The initial goal is to lose at least 5-10% of starting weight to help reduce risk factors.  Counseling:  Intensive lifestyle modifications discussed with Rachael Russell as the most appropriate first line treatment.  she will continue to work on diet, exercise, and weight loss efforts.  We will continue to reassess these conditions on a fairly regular basis in an attempt to decrease the patient's overall morbidity and mortality.  3. Class 1 obesity with serious comorbidity and body mass index (BMI) of 31.0 to 31.9 in adult, unspecified obesity type  Course: [name] is currently in the action stage of change. As such, her goal is to continue with weight loss efforts.   Nutrition goals: She has agreed to the Category 2 Plan.   Exercise goals: As is.  May add light weight lifting into regimen, per patient request today.  Behavioral modification strategies: increasing lean protein intake, decreasing simple carbohydrates, meal planning and cooking strategies and planning for success.  Rachael Russell has agreed to follow-up with our clinic in 2 weeks. She was informed of the importance of frequent follow-up visits to maximize her success with intensive lifestyle modifications for her multiple health conditions.   Objective:   Blood pressure 121/72, pulse 84, temperature 98.1 F (36.7 C), height 5'  5" (1.651 m), weight 188 lb (85.3 kg), last menstrual period 04/25/2019, SpO2 98 %. Body mass index is 31.28 kg/m.  General: Cooperative, alert, well developed, in no acute distress. HEENT: Conjunctivae and lids unremarkable. Cardiovascular: Regular rhythm.  Lungs: Normal work of breathing. Neurologic: No focal deficits.   Lab Results  Component Value Date   CREATININE 0.95 10/22/2020   BUN 15 10/22/2020   NA 143 10/22/2020   K 3.9 10/22/2020   CL 103 10/22/2020   CO2 20 10/22/2020   Lab Results  Component Value Date   ALT 25 10/22/2020   AST 20 10/22/2020   ALKPHOS 96 10/22/2020   BILITOT 0.3 10/22/2020   Lab Results  Component Value Date   HGBA1C 5.8 (H) 10/22/2020   Lab Results  Component Value Date   INSULIN 11.3 10/22/2020   Lab Results  Component Value Date   TSH 1.630 10/22/2020   Lab Results  Component Value Date   CHOL 217 (H) 10/22/2020   HDL 76 10/22/2020   LDLCALC 128 (H) 10/22/2020   LDLDIRECT 149.9 05/11/2011   TRIG 75 10/22/2020   CHOLHDL 2.9 10/22/2020   Lab Results  Component Value Date   WBC 7.8 10/22/2020   HGB 14.4 10/22/2020   HCT 42.1 10/22/2020   MCV 90 10/22/2020   PLT 314 10/22/2020   Lab Results  Component Value Date   FERRITIN 88 04/28/2017   Attestation Statements:   Reviewed by clinician on day of visit: allergies, medications, problem list, medical history, surgical history, family history, social history, and previous encounter notes.  I, Water quality scientist, CMA, am acting as Location manager for [employer], DO.  I have reviewed the above documentation for accuracy and completeness, and I agree with the above. Rachael Russell, D.O.  The [place] was signed into law in 2016 which includes the topic of electronic health records.  This provides immediate access to information in MyChart.  This includes consultation notes, operative notes, office notes, lab results and pathology reports.  If you have any  questions about what you read please let us know at your next visit so we can discuss your concerns and take corrective action if need be.  We are right here with you.

## 2020-12-03 ENCOUNTER — Encounter (INDEPENDENT_AMBULATORY_CARE_PROVIDER_SITE_OTHER): Payer: Self-pay

## 2020-12-05 ENCOUNTER — Ambulatory Visit (INDEPENDENT_AMBULATORY_CARE_PROVIDER_SITE_OTHER): Payer: Managed Care, Other (non HMO) | Admitting: Family Medicine

## 2020-12-17 ENCOUNTER — Ambulatory Visit (INDEPENDENT_AMBULATORY_CARE_PROVIDER_SITE_OTHER): Payer: Managed Care, Other (non HMO) | Admitting: Family Medicine

## 2020-12-17 ENCOUNTER — Other Ambulatory Visit: Payer: Self-pay

## 2020-12-17 ENCOUNTER — Encounter (INDEPENDENT_AMBULATORY_CARE_PROVIDER_SITE_OTHER): Payer: Self-pay | Admitting: Family Medicine

## 2020-12-17 VITALS — BP 111/71 | HR 82 | Temp 98.0°F | Ht 65.0 in | Wt 185.0 lb

## 2020-12-17 DIAGNOSIS — E559 Vitamin D deficiency, unspecified: Secondary | ICD-10-CM

## 2020-12-17 DIAGNOSIS — I10 Essential (primary) hypertension: Secondary | ICD-10-CM | POA: Diagnosis not present

## 2020-12-17 DIAGNOSIS — E669 Obesity, unspecified: Secondary | ICD-10-CM

## 2020-12-17 DIAGNOSIS — Z6831 Body mass index (BMI) 31.0-31.9, adult: Secondary | ICD-10-CM

## 2020-12-17 DIAGNOSIS — R7303 Prediabetes: Secondary | ICD-10-CM | POA: Diagnosis not present

## 2020-12-17 DIAGNOSIS — Z9189 Other specified personal risk factors, not elsewhere classified: Secondary | ICD-10-CM | POA: Diagnosis not present

## 2020-12-25 NOTE — Progress Notes (Signed)
Chief Complaint:   OBESITY Rachael Russell is here to discuss her progress with her obesity treatment plan along with follow-up of her obesity related diagnoses.   Today's visit was #: 4 Starting weight: 192 lbs Starting date: 10/22/2020 Today's weight: 185 lbs Today's date: 12/17/2020 Total lbs lost to date: 7 lbs Body mass index is 30.79 kg/m.  Total weight loss percentage to date: -3.65%  Interim History:  Rachael Russell is here for a follow up office visit and she is following the meal plan without concerns or issues.  Patient's meal and food recall appears to be accurate and consistent with what is on the plan.  When on plan, her hunger and cravings are well controlled.    Rachael Russell snacks on 100 calorie packs of nuts and [place] bars.  Current Meal Plan: the Category 2 Plan for 50% of the time.  Current Exercise Plan: Going to the gym for 30-45 minutes 1-2 times per week.  Assessment/Plan:     1. Vitamin D deficiency Not optimized. Current vitamin D is 46.4, tested on 10/22/2020. Optimal goal > 50 ng/dL. She is taking vitamin D 1,000 IU daily.  Plan: - Reiterated importance of vitamin D (as well as calcium) to their health and wellbeing.  - Reminded Rachael Russell that weight loss will likely improve availability of vitamin D, thus encouraged her to continue with meal plan and their weight loss efforts to further improve this condition. - I recommend she increase her vitamin D to 2,000 IU daily - Informed patient this may be a lifelong thing, and she was encouraged to continue to take the medicine until told otherwise.   - we will need to monitor levels regularly (every 3-4 mo on average) to keep levels within normal limits.  - weight loss will likely improve availability of vitamin D, thus encouraged Rachael Russell to continue with meal plan and their weight loss efforts to further improve this condition - pt's questions and concerns regarding this condition addressed.    2. Essential  hypertension At goal. Medications: Norvasc 5 mg daily, HCTZ 25 mg daily, lisinopril 40 mg daily.  Asymptomatic no.  No concerns.  Plan:  Avoid buying foods that are: processed, frozen, or prepackaged to avoid excess salt. Ambulatory blood pressure monitoring was encouraged with a goal of at least 2-3 times weekly or when feeling poorly.  She was instructed to keep a log for [place] to review at each office visit.   We will continue to monitor closely alongside her PCP and/or Specialist.  Regular follow up with PCP and specialists was also encouraged.   BP Readings from Last 3 Encounters:  12/31/20 134/90  12/17/20 111/71  11/19/20 121/72   Lab Results  Component Value Date   CREATININE 0.95 10/22/2020      3. Prediabetes Not at goal. Goal is HgbA1c < 5.7.  Medication: None.    Plan:  - I reiterated and again counseled patient on pathophysiology of the disease process of I.R. and Pre-DM.   - Stressed importance of dietary and lifestyle modifications resulting in weight loss as first line txmnt - in addition we discussed the risks and benefits of various medication options which can help [place] in the management of this disease process as well as with weight loss.  Will consider starting one of these meds in future as will focus on prudent nutritional plan at this time.  - continue to decrease simple carbs; increase fiber and proteins -> follow meal plan  -  handouts provided at pt's request after education provided.  All concerns/questions addressed.   - anticipatory guidance given.   - Recheck A1c and fasting insulin level in approximately 3 months from last check or as deemed fit.      Lab Results  Component Value Date   HGBA1C 5.8 (H) 10/22/2020   Lab Results  Component Value Date   INSULIN 11.3 10/22/2020      4. At risk for diabetes mellitus - Rachael Russell was given diabetes prevention education and counseling today of more than 9 minutes.  - Counseled patient on pathophysiology of  disease and meaning/ implication of lab results.  - Reviewed how certain foods can either stimulate or inhibit insulin release, and subsequently affect hunger pathways  - Importance of following a healthy meal plan with limiting amounts of simple carbohydrates discussed with patient - Effects of regular aerobic exercise on blood sugar regulation reviewed and encouraged an eventual goal of 30 min 5d/week or more as a minimum.  - Briefly discussed treatment options, which always include dietary and lifestyle modification as first line.   - Handouts provided at patient's desire and/or told to go online to the American Diabetes Association website for further information.    5. Obesity, current BMI 30.9  Course: Rachael Russell is currently in the action stage of change. As such, her goal is to continue with weight loss efforts.   Nutrition goals: She has agreed to the Category 2 Plan.   Exercise goals: For substantial health benefits, adults should do at least 150 minutes (2 hours and 30 minutes) a week of moderate-intensity, or 75 minutes (1 hour and 15 minutes) a week of vigorous-intensity aerobic physical activity, or an equivalent combination of moderate- and vigorous-intensity aerobic activity. Aerobic activity should be performed in episodes of at least 10 minutes, and preferably, it should be spread throughout the week.  Behavioral modification strategies: increasing lean protein intake, meal planning and cooking strategies, keeping healthy foods in the home, better snacking choices, avoiding temptations and planning for success.  Rachael Russell has agreed to follow-up with our clinic in 2 weeks. She was informed of the importance of frequent follow-up visits to maximize her success with intensive lifestyle modifications for her multiple health conditions.     Objective:   Blood pressure 111/71, pulse 82, temperature 98 F (36.7 C), height 5\' 5"  (1.651 m), weight 185 lb (83.9 kg), last menstrual period  04/25/2019, SpO2 97 %. Body mass index is 30.79 kg/m.  General: Cooperative, alert, well developed, in no acute distress. HEENT: Conjunctivae and lids unremarkable. Cardiovascular: Regular rhythm.  Lungs: Normal work of breathing. Neurologic: No focal deficits.   Lab Results  Component Value Date   CREATININE 0.95 10/22/2020   BUN 15 10/22/2020   NA 143 10/22/2020   K 3.9 10/22/2020   CL 103 10/22/2020   CO2 20 10/22/2020   Lab Results  Component Value Date   ALT 25 10/22/2020   AST 20 10/22/2020   ALKPHOS 96 10/22/2020   BILITOT 0.3 10/22/2020   Lab Results  Component Value Date   HGBA1C 5.8 (H) 10/22/2020   Lab Results  Component Value Date   INSULIN 11.3 10/22/2020   Lab Results  Component Value Date   TSH 1.630 10/22/2020   Lab Results  Component Value Date   CHOL 217 (H) 10/22/2020   HDL 76 10/22/2020   LDLCALC 128 (H) 10/22/2020   LDLDIRECT 149.9 05/11/2011   TRIG 75 10/22/2020   CHOLHDL 2.9 10/22/2020  Lab Results  Component Value Date   WBC 7.8 10/22/2020   HGB 14.4 10/22/2020   HCT 42.1 10/22/2020   MCV 90 10/22/2020   PLT 314 10/22/2020   Lab Results  Component Value Date   FERRITIN 88 04/28/2017   Attestation Statements:   Reviewed by clinician on day of visit: allergies, medications, problem list, medical history, surgical history, family history, social history, and previous encounter notes.  I, Water quality scientist, CMA, am acting as Location manager for [employer], DO.  I have reviewed the above documentation for accuracy and completeness, and I agree with the above. Marjory Sneddon, D.O.  The [place] was signed into law in 2016 which includes the topic of electronic health records.  This provides immediate access to information in MyChart.  This includes consultation notes, operative notes, office notes, lab results and pathology reports.  If you have any questions about what you read please let us know at your next  visit so we can discuss your concerns and take corrective action if need be.  We are right here with you.

## 2020-12-31 ENCOUNTER — Encounter (INDEPENDENT_AMBULATORY_CARE_PROVIDER_SITE_OTHER): Payer: Self-pay | Admitting: Family Medicine

## 2020-12-31 ENCOUNTER — Ambulatory Visit (INDEPENDENT_AMBULATORY_CARE_PROVIDER_SITE_OTHER): Payer: Managed Care, Other (non HMO) | Admitting: Family Medicine

## 2020-12-31 ENCOUNTER — Other Ambulatory Visit: Payer: Self-pay

## 2020-12-31 VITALS — BP 134/90 | HR 74 | Temp 97.7°F | Ht 65.0 in | Wt 186.0 lb

## 2020-12-31 DIAGNOSIS — E669 Obesity, unspecified: Secondary | ICD-10-CM

## 2020-12-31 DIAGNOSIS — I10 Essential (primary) hypertension: Secondary | ICD-10-CM | POA: Diagnosis not present

## 2021-01-07 NOTE — Progress Notes (Signed)
Chief Complaint:   OBESITY Rachael Russell is here to discuss her progress with her obesity treatment plan along with follow-up of her obesity related diagnoses.   Today's visit was #: 5 Starting weight: 192 lbs Starting date: 10/22/2020 Today's weight: 186 lbs Today's date: 12/31/2020 Weight change since last visit: +1 lb Total lbs lost to date: 6 lbs Body mass index is 30.95 kg/m.  Total weight loss percentage to date: -3.13%  Interim History:  Rachael Russell says that during the first week she followed the plan for about 90% of the time, but last week she had her son's graduation and did poorly all week.  She has been skipping meals, and at times, eating off plan.  She is glad she only gained 1 pound considering her recent challenges.  Current Meal Plan: the Category 2 Plan for 50% of the time.  Current Exercise Plan: None.  Assessment/Plan:     1. Essential hypertension Medications: Norvasc 5 mg daily, HCTZ 25 mg daily, lisinopril 40 mg daily.  At home, 120s/70, 111/62.  Checks it twice weekly.  Plan:  Above goal today, but well controlled at home.  Continue home blood pressure monitoring and prudent nutritional plan and weight loss. Avoid buying foods that are: processed, frozen, or prepackaged to avoid excess salt. We will continue to monitor closely alongside her PCP and/or Specialist.  Regular follow up with PCP and specialists was also encouraged.   BP Readings from Last 3 Encounters:  12/31/20 134/90  12/17/20 111/71  11/19/20 121/72   Lab Results  Component Value Date   CREATININE 0.95 10/22/2020   2. Obesity, current BMI 31.0  Course: Rachael Russell is currently in the action stage of change. As such, her goal is to continue with weight loss efforts.   Nutrition goals: She has agreed to the Category 2 Plan.   Exercise goals: Walking for 10-15 minutes daily or going to the gym.  Behavioral modification strategies: meal planning and cooking strategies and planning for  success.  Rachael Russell has agreed to follow-up with our clinic in 3 weeks. She was informed of the importance of frequent follow-up visits to maximize her success with intensive lifestyle modifications for her multiple health conditions.   Objective:   Blood pressure 134/90, pulse 74, temperature 97.7 F (36.5 C), height 5\' 5"  (1.651 m), weight 186 lb (84.4 kg), last menstrual period 04/25/2019, SpO2 98 %. Body mass index is 30.95 kg/m.  General: Cooperative, alert, well developed, in no acute distress. HEENT: Conjunctivae and lids unremarkable. Cardiovascular: Regular rhythm.  Lungs: Normal work of breathing. Neurologic: No focal deficits.   Lab Results  Component Value Date   CREATININE 0.95 10/22/2020   BUN 15 10/22/2020   NA 143 10/22/2020   K 3.9 10/22/2020   CL 103 10/22/2020   CO2 20 10/22/2020   Lab Results  Component Value Date   ALT 25 10/22/2020   AST 20 10/22/2020   ALKPHOS 96 10/22/2020   BILITOT 0.3 10/22/2020   Lab Results  Component Value Date   HGBA1C 5.8 (H) 10/22/2020   Lab Results  Component Value Date   INSULIN 11.3 10/22/2020   Lab Results  Component Value Date   TSH 1.630 10/22/2020   Lab Results  Component Value Date   CHOL 217 (H) 10/22/2020   HDL 76 10/22/2020   LDLCALC 128 (H) 10/22/2020   LDLDIRECT 149.9 05/11/2011   TRIG 75 10/22/2020   CHOLHDL 2.9 10/22/2020   Lab Results  Component Value Date  WBC 7.8 10/22/2020   HGB 14.4 10/22/2020   HCT 42.1 10/22/2020   MCV 90 10/22/2020   PLT 314 10/22/2020   Lab Results  Component Value Date   FERRITIN 88 04/28/2017   Attestation Statements:   Reviewed by clinician on day of visit: allergies, medications, problem list, medical history, surgical history, family history, social history, and previous encounter notes.  Time spent on visit including pre-visit chart review and post-visit care and charting was 20 minutes.   I, Water quality scientist, CMA, am acting as Location manager for [employer], DO.  I have reviewed the above documentation for accuracy and completeness, and I agree with the above. Rachael Russell, D.O.  The [place] was signed into law in 2016 which includes the topic of electronic health records.  This provides immediate access to information in MyChart.  This includes consultation notes, operative notes, office notes, lab results and pathology reports.  If you have any questions about what you read please let us know at your next visit so we can discuss your concerns and take corrective action if need be.  We are right here with you.

## 2021-01-13 ENCOUNTER — Encounter (INDEPENDENT_AMBULATORY_CARE_PROVIDER_SITE_OTHER): Payer: Self-pay | Admitting: Family Medicine

## 2021-01-13 NOTE — Telephone Encounter (Signed)
This just an FYI from the patient.  She is not changing 6/14 appt.

## 2021-01-14 ENCOUNTER — Ambulatory Visit (INDEPENDENT_AMBULATORY_CARE_PROVIDER_SITE_OTHER): Payer: Managed Care, Other (non HMO) | Admitting: Family Medicine

## 2021-01-18 ENCOUNTER — Other Ambulatory Visit (INDEPENDENT_AMBULATORY_CARE_PROVIDER_SITE_OTHER): Payer: Self-pay | Admitting: Family Medicine

## 2021-01-18 DIAGNOSIS — K219 Gastro-esophageal reflux disease without esophagitis: Secondary | ICD-10-CM

## 2021-01-22 NOTE — Telephone Encounter (Signed)
Pt last seen by Dr. Opalski.  

## 2021-01-23 NOTE — Telephone Encounter (Signed)
Mychart message sent.

## 2021-02-04 ENCOUNTER — Ambulatory Visit (INDEPENDENT_AMBULATORY_CARE_PROVIDER_SITE_OTHER): Payer: Managed Care, Other (non HMO) | Admitting: Family Medicine

## 2021-02-04 ENCOUNTER — Other Ambulatory Visit: Payer: Self-pay

## 2021-02-04 ENCOUNTER — Encounter (INDEPENDENT_AMBULATORY_CARE_PROVIDER_SITE_OTHER): Payer: Self-pay | Admitting: Family Medicine

## 2021-02-04 VITALS — BP 137/93 | HR 78 | Temp 98.0°F | Ht 65.0 in | Wt 189.0 lb

## 2021-02-04 DIAGNOSIS — K219 Gastro-esophageal reflux disease without esophagitis: Secondary | ICD-10-CM | POA: Diagnosis not present

## 2021-02-04 DIAGNOSIS — R7303 Prediabetes: Secondary | ICD-10-CM | POA: Diagnosis not present

## 2021-02-04 DIAGNOSIS — Z9189 Other specified personal risk factors, not elsewhere classified: Secondary | ICD-10-CM | POA: Diagnosis not present

## 2021-02-04 DIAGNOSIS — Z6831 Body mass index (BMI) 31.0-31.9, adult: Secondary | ICD-10-CM

## 2021-02-04 DIAGNOSIS — E669 Obesity, unspecified: Secondary | ICD-10-CM | POA: Diagnosis not present

## 2021-02-04 MED ORDER — FAMOTIDINE 20 MG PO TABS: 20.0000 mg | ORAL_TABLET | Freq: Two times a day (BID) | ORAL | 0 refills | Status: DC | PRN

## 2021-02-11 NOTE — Progress Notes (Signed)
Chief Complaint:   OBESITY Rachael Russell is here to discuss her progress with her obesity treatment plan along with follow-up of her obesity related diagnoses.   Today's visit was #: 6 Starting weight: 192 lbs Starting date: 10/22/2020 Today's weight: 189 lbs Today's date: 02/04/2021 Weight change since last visit: +3 lbs Total lbs lost to date: 3 lbs Body mass index is 31.45 kg/m.  Total weight loss percentage to date: -1.56%  Interim History:  Rachael Russell says she has been under increased stress and more busy thatn usual with work life.  She has lost her drive and focus to get healthier.  No time to focus on self-care.  A little bored with lunch.  Plan:  Meal prepping ideas (on Sunday) discussed.  Strategies to care for self reviewed with her today.  She will start exercise again.  Current Meal Plan: the Category 2 Plan for 0% of the time.  Current Exercise Plan: None at this time.   Assessment/Plan:   Medications Discontinued During This Encounter  Medication Reason   famotidine (PEPCID) 20 MG tablet Reorder   Meds ordered this encounter  Medications   famotidine (PEPCID) 20 MG tablet    Sig: Take 1 tablet (20 mg total) by mouth 2 (two) times daily as needed for heartburn or indigestion.    Dispense:  60 tablet    Refill:  0    Needs OV for RF   1. Gastroesophageal reflux disease, unspecified whether esophagitis present Rachael Russell is taking famotidine 20 mg twice daily for GERD.  No issues.  Symptoms controlled on medication.   Plan:  Will refill famotidine today, as per below.  We reviewed the diagnosis of GERD and importance of treatment. We discussed "red flag" symptoms and the importance of follow up if symptoms persisted despite treatment. We reviewed non-pharmacologic management of GERD symptoms: including: caffeine reduction, dietary changes, elevate HOB, NPO after supper, reduction of alcohol intake, tobacco cessation, and weight loss.  - Refill famotidine (PEPCID) 20 MG  tablet; Take 1 tablet (20 mg total) by mouth 2 (two) times daily as needed for heartburn or indigestion.  Dispense: 60 tablet; Refill: 0  2. Prediabetes Not optimized. Goal is HgbA1c < 5.7.  Medication: None.    Plan:  She will continue to focus on protein-rich, low simple carbohydrate foods. We reviewed the importance of hydration, regular exercise for stress reduction, and restorative sleep.  Will consider medication in the future to help her since she has gained weight over the past two office visits.   Lab Results  Component Value Date   HGBA1C 5.8 (H) 10/22/2020   Lab Results  Component Value Date   INSULIN 11.3 10/22/2020   3. At risk for depression Rachael Russell was given approximately 11 minutes of depression prevention counseling today due to their higher than average risk for this condition. The patient has several risk factors for depression such as chronic medical conditions, sleep issues, major life stressors/events, etc., and we discussed these today.  Rachael Russell was also counseled on the importance of a healthy work-life balance, a healthy relationship with food, and a good support system.  We discussed various strategies to help cope with these emotions as well.  I recommended counseling, meditation or prayer, healthy eating habits, sleep hygiene, and exercising to help manage these feelings.   4. Class 1 obesity with serious comorbidity and body mass index (BMI) of 31.0 to 31.9 in adult, unspecified obesity type  Course: Rachael Russell is currently  in the action stage of change. As such, her goal is to continue with weight loss efforts.   Nutrition goals: She has agreed to the Category 2 Plan with lunch options.   Exercise goals:  Start going to the gym 3 days per week for 30 minutes of cardio.  Behavioral modification strategies: no skipping meals, meal planning and cooking strategies, and planning for success.  Rachael Russell has agreed to follow-up with our clinic in 2-3 weeks.  She was informed of the importance of frequent follow-up visits to maximize her success with intensive lifestyle modifications for her multiple health conditions.     Objective:   Blood pressure (!) 137/93, pulse 78, temperature 98 F (36.7 C), height 5\' 5"  (1.651 m), weight 189 lb (85.7 kg), last menstrual period 04/25/2019, SpO2 98 %. Body mass index is 31.45 kg/m.  General: Cooperative, alert, well developed, in no acute distress. HEENT: Conjunctivae and lids unremarkable. Cardiovascular: Regular rhythm.  Lungs: Normal work of breathing. Neurologic: No focal deficits.   Lab Results  Component Value Date   CREATININE 0.95 10/22/2020   BUN 15 10/22/2020   NA 143 10/22/2020   K 3.9 10/22/2020   CL 103 10/22/2020   CO2 20 10/22/2020   Lab Results  Component Value Date   ALT 25 10/22/2020   AST 20 10/22/2020   ALKPHOS 96 10/22/2020   BILITOT 0.3 10/22/2020   Lab Results  Component Value Date   HGBA1C 5.8 (H) 10/22/2020   Lab Results  Component Value Date   INSULIN 11.3 10/22/2020   Lab Results  Component Value Date   TSH 1.630 10/22/2020   Lab Results  Component Value Date   CHOL 217 (H) 10/22/2020   HDL 76 10/22/2020   LDLCALC 128 (H) 10/22/2020   LDLDIRECT 149.9 05/11/2011   TRIG 75 10/22/2020   CHOLHDL 2.9 10/22/2020   Lab Results  Component Value Date   WBC 7.8 10/22/2020   HGB 14.4 10/22/2020   HCT 42.1 10/22/2020   MCV 90 10/22/2020   PLT 314 10/22/2020   Lab Results  Component Value Date   FERRITIN 88 04/28/2017     Attestation Statements:   Reviewed by clinician on day of visit: allergies, medications, problem list, medical history, surgical history, family history, social history, and previous encounter notes.  I, Water quality scientist, CMA, am acting as Location manager for [employer], DO.  I have reviewed the above documentation for accuracy and completeness, and I agree with the above. Marjory Sneddon, D.O.  The [place] was signed into law in 2016 which includes the topic of electronic health records.  This provides immediate access to information in MyChart.  This includes consultation notes, operative notes, office notes, lab results and pathology reports.  If you have any questions about what you read please let us know at your next visit so we can discuss your concerns and take corrective action if need be.  We are right here with you.

## 2021-02-19 ENCOUNTER — Telehealth: Payer: Self-pay | Admitting: Family Medicine

## 2021-02-19 DIAGNOSIS — K219 Gastro-esophageal reflux disease without esophagitis: Secondary | ICD-10-CM

## 2021-02-19 NOTE — Telephone Encounter (Signed)
  LAST APPOINTMENT DATE: 10/09/2020   NEXT APPOINTMENT DATE:@8 /07/2021  MEDICATION:famotidine   PHARMACY: [facility] [place]  Let patient know to contact pharmacy at the end of the day to make sure medication is ready.  Please notify patient to allow 48-72 hours to process  Encourage patient to contact the pharmacy for refills or they can request refills through [place]:   LAST REFILL:  QTY:  REFILL DATE:    OTHER COMMENTS:    Okay for refill?  Please advise

## 2021-02-20 MED ORDER — FAMOTIDINE 20 MG PO TABS: 20.0000 mg | ORAL_TABLET | Freq: Two times a day (BID) | ORAL | 1 refills | Status: DC | PRN

## 2021-02-20 NOTE — Addendum Note (Signed)
Addended by: Carter Kitten on: 02/20/2021 11:40 AM   Modules accepted: Orders

## 2021-02-20 NOTE — Telephone Encounter (Signed)
Refills sent as requested. Please schedule CPE with fasting labs prior with Dr. Glori Bickers for sometime after 04/10/2021.

## 2021-02-27 ENCOUNTER — Other Ambulatory Visit: Payer: Self-pay | Admitting: *Deleted

## 2021-02-27 DIAGNOSIS — K219 Gastro-esophageal reflux disease without esophagitis: Secondary | ICD-10-CM

## 2021-02-27 MED ORDER — FAMOTIDINE 20 MG PO TABS: 20.0000 mg | ORAL_TABLET | Freq: Two times a day (BID) | ORAL | 0 refills | Status: DC | PRN

## 2021-02-27 NOTE — Telephone Encounter (Signed)
Pharmacy sent note saying Rx has to be a 90 day supply for insurance. Rx resent

## 2021-03-05 ENCOUNTER — Ambulatory Visit (INDEPENDENT_AMBULATORY_CARE_PROVIDER_SITE_OTHER): Payer: Managed Care, Other (non HMO) | Admitting: Family Medicine

## 2021-03-05 ENCOUNTER — Other Ambulatory Visit: Payer: Self-pay

## 2021-03-05 VITALS — BP 122/76 | HR 89 | Temp 98.5°F | Ht 65.0 in | Wt 190.0 lb

## 2021-03-05 DIAGNOSIS — E559 Vitamin D deficiency, unspecified: Secondary | ICD-10-CM | POA: Diagnosis not present

## 2021-03-05 DIAGNOSIS — R7303 Prediabetes: Secondary | ICD-10-CM | POA: Diagnosis not present

## 2021-03-05 DIAGNOSIS — E7849 Other hyperlipidemia: Secondary | ICD-10-CM

## 2021-03-05 DIAGNOSIS — E669 Obesity, unspecified: Secondary | ICD-10-CM

## 2021-03-05 DIAGNOSIS — Z9189 Other specified personal risk factors, not elsewhere classified: Secondary | ICD-10-CM | POA: Diagnosis not present

## 2021-03-05 DIAGNOSIS — I10 Essential (primary) hypertension: Secondary | ICD-10-CM | POA: Diagnosis not present

## 2021-03-05 DIAGNOSIS — Z6831 Body mass index (BMI) 31.0-31.9, adult: Secondary | ICD-10-CM

## 2021-03-18 NOTE — Progress Notes (Signed)
Chief Complaint:   OBESITY Rachael Russell is here to discuss her progress with her obesity treatment plan along with follow-up of her obesity related diagnoses.   Today's visit was #: 7 Starting weight: 192 lbs Starting date: 10/22/2020 Today's weight: 190 lbs Today's date: 03/05/2021 Weight change since last visit: +1 lb Total lbs lost to date: 2 lbs Body mass index is 31.62 kg/m.  Total weight loss percentage to date: -1.04%  Interim History:  Rachael Russell says she has not been focusing on her own needs in terms of food prep, going to the gym, etc.  She thinks a more stringent, focused meal plan would be better for getting her back on track and wants to journal.  Plan:  Obtain A1c, FLP, and vitamin D at next office visit.  Current Meal Plan: the Category 2 Plan with lunch options for 40% of the time.  Current Exercise Plan: Elliptical, weights for 45-60 minutes 3-4 times per week.  Assessment/Plan:   Orders Placed This Encounter  Procedures   Hemoglobin A1c   Lipid Panel With LDL/HDL Ratio   VITAMIN D 25 Hydroxy (Vit-D Deficiency, Fractures)   1. Essential hypertension At goal. Medications: Norvasc 5 mg daily, HCTZ 25 mg daily, lisinopril 40 mg daily.   Plan: Avoid buying foods that are: processed, frozen, or prepackaged to avoid excess salt. We will watch for signs of hypotension as she continues lifestyle modifications. We will continue to monitor closely alongside her PCP and/or Specialist.  Regular follow up with PCP and specialists was also encouraged.   BP Readings from Last 3 Encounters:  03/05/21 122/76  02/04/21 (!) 137/93  12/31/20 134/90   Lab Results  Component Value Date   CREATININE 0.95 10/22/2020   2. Prediabetes Not at goal. Goal is HgbA1c < 5.7.  Medication: None.  A1c was 5.8 ~3 months ago.  Plan:  She will continue to focus on protein-rich, low simple carbohydrate foods. We reviewed the importance of hydration, regular exercise for stress reduction, and  restorative sleep.  Recheck labs at next visit.  Consider medication in the future to aid in weight loss if no improvement in A1c.  Lab Results  Component Value Date   HGBA1C 5.8 (H) 10/22/2020   Lab Results  Component Value Date   INSULIN 11.3 10/22/2020   - Hemoglobin A1c  3. Other hyperlipidemia Course: Not at goal. Lipid-lowering medications: Lipitor 10 mg daily.  Elevated LDL but with excellent HDL.  Plan: Dietary changes: Increase soluble fiber, decrease simple carbohydrates, decrease saturated fat. Exercise changes: Moderate to vigorous-intensity aerobic activity 150 minutes per week or as tolerated. We will continue to monitor along with PCP/specialists as it pertains to her weight loss journey.  Check FLP next office visit and will add direct LDL if not fasting.  Lab Results  Component Value Date   CHOL 217 (H) 10/22/2020   HDL 76 10/22/2020   LDLCALC 128 (H) 10/22/2020   LDLDIRECT 149.9 05/11/2011   TRIG 75 10/22/2020   CHOLHDL 2.9 10/22/2020   Lab Results  Component Value Date   ALT 25 10/22/2020   AST 20 10/22/2020   ALKPHOS 96 10/22/2020   BILITOT 0.3 10/22/2020   The 10-year ASCVD risk score [name], et al., 2013) is: 3.3%   Values used to calculate the score:     Age: 55 years     Sex: Female     Is Non-Hispanic African American: Yes     Diabetic: No  Tobacco smoker: No     Systolic Blood Pressure: 123XX123 mmHg     Is BP treated: Yes     HDL Cholesterol: 76 mg/dL     Total Cholesterol: 217 mg/dL  - Lipid Panel With LDL/HDL Ratio  4. Vitamin D deficiency Not optimized.  She is taking OTC vitamin D 2,000 IU daily.  Plan: Continue current OTC vitamin D supplementation.  Will check vitamin D level at next office visit.  Lab Results  Component Value Date   VD25OH 46.4 10/22/2020   VD25OH 47.5 04/10/2020   VD25OH 44.0 04/27/2019   - VITAMIN D 25 Hydroxy (Vit-D Deficiency, Fractures)  5. At risk for heart disease Due to Rachael Russell's current state  of health and medical condition(s), she is at a higher risk for heart disease.  This puts the patient at much greater risk to subsequently develop cardiopulmonary conditions that can significantly affect patient's quality of life in a negative manner.    At least 9 minutes were spent on counseling Rachael Russell about these concerns today, and I stressed the importance of reversing risks factors of obesity, especially truncal and visceral fat, hypertension, hyperlipidemia, and pre-diabetes.  The initial goal is to lose at least 5-10% of starting weight to help reduce these risk factors.  Counseling:  Intensive lifestyle modifications were discussed with Rachael Russell as the most appropriate first line of treatment.  she will continue to work on diet, exercise, and weight loss efforts.  We will continue to reassess these conditions on a fairly regular basis in an attempt to decrease the patient's overall morbidity and mortality.  Evidence-based interventions for health behavior change were utilized today including the discussion of self monitoring techniques, problem-solving barriers, and SMART goal setting techniques.  Specifically, regarding patient's less desirable eating habits and patterns, we employed the technique of small changes when [name] has not been able to fully commit to her prudent nutritional plan.  6. Class 1 obesity with serious comorbidity and body mass index (BMI) of 31.0 to 31.9 in adult, unspecified obesity type  Course: Rachael Russell is currently in the action stage of change. As such, her goal is to continue with weight loss efforts.   Nutrition goals: She has agreed to keeping a food journal and adhering to recommended goals of 1200-1300 calories and 90+ grams of protein.   Exercise goals:  As is.  Behavioral modification strategies: planning for success and keeping a strict food journal.  Rachael Russell has agreed to follow-up with our clinic in 2-3 weeks. She was informed of the importance of frequent  follow-up visits to maximize her success with intensive lifestyle modifications for her multiple health conditions.   Objective:   Blood pressure 122/76, pulse 89, temperature 98.5 F (36.9 C), height '5\' 5"'$  (1.651 m), weight 190 lb (86.2 kg), last menstrual period 04/25/2019, SpO2 99 %. Body mass index is 31.62 kg/m.  General: Cooperative, alert, well developed, in no acute distress. HEENT: Conjunctivae and lids unremarkable. Cardiovascular: Regular rhythm.  Lungs: Normal work of breathing. Neurologic: No focal deficits.   Lab Results  Component Value Date   CREATININE 0.95 10/22/2020   BUN 15 10/22/2020   NA 143 10/22/2020   K 3.9 10/22/2020   CL 103 10/22/2020   CO2 20 10/22/2020   Lab Results  Component Value Date   ALT 25 10/22/2020   AST 20 10/22/2020   ALKPHOS 96 10/22/2020   BILITOT 0.3 10/22/2020   Lab Results  Component Value Date   HGBA1C 5.8 (H) 10/22/2020  Lab Results  Component Value Date   INSULIN 11.3 10/22/2020   Lab Results  Component Value Date   TSH 1.630 10/22/2020   Lab Results  Component Value Date   CHOL 217 (H) 10/22/2020   HDL 76 10/22/2020   LDLCALC 128 (H) 10/22/2020   LDLDIRECT 149.9 05/11/2011   TRIG 75 10/22/2020   CHOLHDL 2.9 10/22/2020   Lab Results  Component Value Date   VD25OH 46.4 10/22/2020   VD25OH 47.5 04/10/2020   VD25OH 44.0 04/27/2019   Lab Results  Component Value Date   WBC 7.8 10/22/2020   HGB 14.4 10/22/2020   HCT 42.1 10/22/2020   MCV 90 10/22/2020   PLT 314 10/22/2020   Lab Results  Component Value Date   FERRITIN 88 04/28/2017   Attestation Statements:   Reviewed by clinician on day of visit: allergies, medications, problem list, medical history, surgical history, family history, social history, and previous encounter notes.  I, Water quality scientist, CMA, am acting as Location manager for [employer], DO.  I have reviewed the above documentation for accuracy and completeness, and I agree with the  above. Marjory Sneddon, D.O.  The [place] was signed into law in 2016 which includes the topic of electronic health records.  This provides immediate access to information in MyChart.  This includes consultation notes, operative notes, office notes, lab results and pathology reports.  If you have any questions about what you read please let us know at your next visit so we can discuss your concerns and take corrective action if need be.  We are right here with you.

## 2021-03-19 ENCOUNTER — Ambulatory Visit (INDEPENDENT_AMBULATORY_CARE_PROVIDER_SITE_OTHER): Payer: Managed Care, Other (non HMO) | Admitting: Family Medicine

## 2021-03-19 ENCOUNTER — Other Ambulatory Visit: Payer: Self-pay

## 2021-03-19 ENCOUNTER — Encounter (INDEPENDENT_AMBULATORY_CARE_PROVIDER_SITE_OTHER): Payer: Self-pay | Admitting: Family Medicine

## 2021-03-19 VITALS — BP 132/82 | HR 75 | Temp 97.7°F | Ht 65.0 in | Wt 186.0 lb

## 2021-03-19 DIAGNOSIS — E669 Obesity, unspecified: Secondary | ICD-10-CM | POA: Diagnosis not present

## 2021-03-19 DIAGNOSIS — Z6831 Body mass index (BMI) 31.0-31.9, adult: Secondary | ICD-10-CM

## 2021-03-19 DIAGNOSIS — F3289 Other specified depressive episodes: Secondary | ICD-10-CM | POA: Diagnosis not present

## 2021-03-24 ENCOUNTER — Encounter (INDEPENDENT_AMBULATORY_CARE_PROVIDER_SITE_OTHER): Payer: Self-pay | Admitting: Family Medicine

## 2021-03-24 DIAGNOSIS — F32A Depression, unspecified: Secondary | ICD-10-CM | POA: Insufficient documentation

## 2021-03-24 NOTE — Progress Notes (Signed)
Chief Complaint:   OBESITY Rachael Russell is here to discuss her progress with her obesity treatment plan along with follow-up of her obesity related diagnoses. Rachael Russell is on the Category 2 Plan with lunch options and keeping a food journal and adhering to recommended goals of 1200-1300 calories and 90 grams protein and states she is following her eating plan approximately 70% of the time. Rachael Russell states she is walking 30-45 minutes 2-4 times per week.  Today's visit was #: 8 Starting weight: 192 lbs Starting date: 10/22/2020 Today's weight: 186 lbs Today's date: 03/19/2021 Total lbs lost to date: 6 Total lbs lost since last in-office visit: 4  Interim History: Rachael Russell is following category 2 plan and also keeps track of calories because this is helpful for her. She has had more sweets recently but is working on reducing sweets. She had sweet tea 1 time over the past few weeks.   Subjective:   1. Other depression with emotional eating Rachael Russell notes sweets cravings that worsen the more sweets she eats.  Assessment/Plan:   1. Other depression with emotional eating Discussed bupropion and she will consider. She has no history seizures or glaucoma.  2. Obesity: Current BMI 30.95  Rachael Russell is currently in the action stage of change. As such, her goal is to continue with weight loss efforts. She has agreed to the Category 2 Plan.   Discussed possibly starting something for cravings at a future visit. Pt does not like needles. Handout: 100 Calorie Snacks Choose protein snacks versus carbs.  Exercise goals:  As is  Behavioral modification strategies: better snacking choices.  Rachael Russell has agreed to follow-up with our clinic in 2 weeks with Dr. Raliegh Scarlet.  Objective:   Blood pressure 132/82, pulse 75, temperature 97.7 F (36.5 C), height '5\' 5"'$  (1.651 m), weight 186 lb (84.4 kg), last menstrual period 04/25/2019, SpO2 99 %. Body mass index is 30.95 kg/m.  General: Cooperative, alert, well  developed, in no acute distress. HEENT: Conjunctivae and lids unremarkable. Cardiovascular: Regular rhythm.  Lungs: Normal work of breathing. Neurologic: No focal deficits.   Lab Results  Component Value Date   CREATININE 0.95 10/22/2020   BUN 15 10/22/2020   NA 143 10/22/2020   K 3.9 10/22/2020   CL 103 10/22/2020   CO2 20 10/22/2020   Lab Results  Component Value Date   ALT 25 10/22/2020   AST 20 10/22/2020   ALKPHOS 96 10/22/2020   BILITOT 0.3 10/22/2020   Lab Results  Component Value Date   HGBA1C 5.8 (H) 10/22/2020   Lab Results  Component Value Date   INSULIN 11.3 10/22/2020   Lab Results  Component Value Date   TSH 1.630 10/22/2020   Lab Results  Component Value Date   CHOL 217 (H) 10/22/2020   HDL 76 10/22/2020   LDLCALC 128 (H) 10/22/2020   LDLDIRECT 149.9 05/11/2011   TRIG 75 10/22/2020   CHOLHDL 2.9 10/22/2020   Lab Results  Component Value Date   VD25OH 46.4 10/22/2020   VD25OH 47.5 04/10/2020   VD25OH 44.0 04/27/2019   Lab Results  Component Value Date   WBC 7.8 10/22/2020   HGB 14.4 10/22/2020   HCT 42.1 10/22/2020   MCV 90 10/22/2020   PLT 314 10/22/2020   Lab Results  Component Value Date   FERRITIN 88 04/28/2017    Attestation Statements:   Reviewed by clinician on day of visit: allergies, medications, problem list, medical history, surgical history, family history, social history, and previous  encounter notes.  Coral Ceo, CMA, am acting as Location manager for [employer], [place].  I have reviewed the above documentation for accuracy and completeness, and I agree with the above. -  Georgianne Fick, FNP

## 2021-04-02 ENCOUNTER — Other Ambulatory Visit: Payer: Self-pay

## 2021-04-02 ENCOUNTER — Ambulatory Visit (INDEPENDENT_AMBULATORY_CARE_PROVIDER_SITE_OTHER): Payer: Managed Care, Other (non HMO) | Admitting: Family Medicine

## 2021-04-02 ENCOUNTER — Other Ambulatory Visit: Payer: Self-pay | Admitting: Family Medicine

## 2021-04-02 ENCOUNTER — Encounter (INDEPENDENT_AMBULATORY_CARE_PROVIDER_SITE_OTHER): Payer: Self-pay | Admitting: Family Medicine

## 2021-04-02 VITALS — BP 105/67 | HR 75 | Temp 98.1°F | Ht 65.0 in | Wt 185.0 lb

## 2021-04-02 DIAGNOSIS — E669 Obesity, unspecified: Secondary | ICD-10-CM

## 2021-04-02 DIAGNOSIS — Z6831 Body mass index (BMI) 31.0-31.9, adult: Secondary | ICD-10-CM

## 2021-04-02 DIAGNOSIS — I10 Essential (primary) hypertension: Secondary | ICD-10-CM | POA: Diagnosis not present

## 2021-04-03 ENCOUNTER — Telehealth: Payer: Self-pay | Admitting: Family Medicine

## 2021-04-03 DIAGNOSIS — E7849 Other hyperlipidemia: Secondary | ICD-10-CM

## 2021-04-03 DIAGNOSIS — E559 Vitamin D deficiency, unspecified: Secondary | ICD-10-CM

## 2021-04-03 DIAGNOSIS — D509 Iron deficiency anemia, unspecified: Secondary | ICD-10-CM

## 2021-04-03 DIAGNOSIS — R7303 Prediabetes: Secondary | ICD-10-CM

## 2021-04-03 DIAGNOSIS — I10 Essential (primary) hypertension: Secondary | ICD-10-CM

## 2021-04-03 DIAGNOSIS — E7800 Pure hypercholesterolemia, unspecified: Secondary | ICD-10-CM

## 2021-04-03 NOTE — Telephone Encounter (Signed)
-----   Message from Ellamae Sia sent at 03/17/2021  9:43 AM EDT ----- Regarding: Lab orders for Friday, 8.12.22 Patient is scheduled for CPX labs, please order future labs, Thanks , Karna Christmas

## 2021-04-04 ENCOUNTER — Other Ambulatory Visit: Payer: Managed Care, Other (non HMO)

## 2021-04-04 ENCOUNTER — Other Ambulatory Visit: Payer: Self-pay

## 2021-04-04 DIAGNOSIS — E559 Vitamin D deficiency, unspecified: Secondary | ICD-10-CM

## 2021-04-04 DIAGNOSIS — I10 Essential (primary) hypertension: Secondary | ICD-10-CM

## 2021-04-04 DIAGNOSIS — R7303 Prediabetes: Secondary | ICD-10-CM

## 2021-04-04 DIAGNOSIS — D509 Iron deficiency anemia, unspecified: Secondary | ICD-10-CM

## 2021-04-04 DIAGNOSIS — E7800 Pure hypercholesterolemia, unspecified: Secondary | ICD-10-CM

## 2021-04-04 DIAGNOSIS — E7849 Other hyperlipidemia: Secondary | ICD-10-CM

## 2021-04-04 NOTE — Addendum Note (Signed)
Addended by: Ellamae Sia on: 04/04/2021 11:24 AM   Modules accepted: Orders

## 2021-04-05 LAB — CBC WITH DIFFERENTIAL/PLATELET
Basophils Absolute: 0 10*3/uL (ref 0.0–0.2)
Basos: 1 %
EOS (ABSOLUTE): 0.1 10*3/uL (ref 0.0–0.4)
Eos: 2 %
Hematocrit: 40.6 % (ref 34.0–46.6)
Hemoglobin: 13.5 g/dL (ref 11.1–15.9)
Immature Grans (Abs): 0 10*3/uL (ref 0.0–0.1)
Immature Granulocytes: 0 %
Lymphocytes Absolute: 2.4 10*3/uL (ref 0.7–3.1)
Lymphs: 31 %
MCH: 29.9 pg (ref 26.6–33.0)
MCHC: 33.3 g/dL (ref 31.5–35.7)
MCV: 90 fL (ref 79–97)
Monocytes Absolute: 0.6 10*3/uL (ref 0.1–0.9)
Monocytes: 8 %
Neutrophils Absolute: 4.6 10*3/uL (ref 1.4–7.0)
Neutrophils: 58 %
Platelets: 293 10*3/uL (ref 150–450)
RBC: 4.52 x10E6/uL (ref 3.77–5.28)
RDW: 12.3 % (ref 11.7–15.4)
WBC: 7.8 10*3/uL (ref 3.4–10.8)

## 2021-04-05 LAB — LIPID PANEL
Chol/HDL Ratio: 3.1 ratio (ref 0.0–4.4)
Cholesterol, Total: 209 mg/dL — ABNORMAL HIGH (ref 100–199)
HDL: 68 mg/dL (ref >39)
LDL Chol Calc (NIH): 129 mg/dL — ABNORMAL HIGH (ref 0–99)
Triglycerides: 65 mg/dL (ref 0–149)
VLDL Cholesterol Cal: 12 mg/dL (ref 5–40)

## 2021-04-05 LAB — COMPREHENSIVE METABOLIC PANEL
ALT: 25 IU/L (ref 0–32)
AST: 24 IU/L (ref 0–40)
Albumin/Globulin Ratio: 1.9 (ref 1.2–2.2)
Albumin: 4.5 g/dL (ref 3.8–4.9)
Alkaline Phosphatase: 97 IU/L (ref 44–121)
BUN/Creatinine Ratio: 23 (ref 9–23)
BUN: 22 mg/dL (ref 6–24)
Bilirubin Total: 0.3 mg/dL (ref 0.0–1.2)
CO2: 25 mmol/L (ref 20–29)
Calcium: 9.9 mg/dL (ref 8.7–10.2)
Chloride: 101 mmol/L (ref 96–106)
Creatinine, Ser: 0.97 mg/dL (ref 0.57–1.00)
Globulin, Total: 2.4 g/dL (ref 1.5–4.5)
Glucose: 79 mg/dL (ref 65–99)
Potassium: 4.1 mmol/L (ref 3.5–5.2)
Sodium: 141 mmol/L (ref 134–144)
Total Protein: 6.9 g/dL (ref 6.0–8.5)
eGFR: 69 mL/min/{1.73_m2} (ref >59)

## 2021-04-05 LAB — HEMOGLOBIN A1C
Est. average glucose Bld gHb Est-mCnc: 120 mg/dL
Hgb A1c MFr Bld: 5.8 % — ABNORMAL HIGH (ref 4.8–5.6)

## 2021-04-05 LAB — TSH: TSH: 2.41 u[IU]/mL (ref 0.450–4.500)

## 2021-04-05 LAB — IRON: Iron: 84 ug/dL (ref 27–159)

## 2021-04-05 LAB — VITAMIN D 25 HYDROXY (VIT D DEFICIENCY, FRACTURES): Vit D, 25-Hydroxy: 53.7 ng/mL (ref 30.0–100.0)

## 2021-04-07 NOTE — Progress Notes (Signed)
Chief Complaint:   OBESITY Rachael Russell is here to discuss her progress with her obesity treatment plan along with follow-up of her obesity related diagnoses. Rachael Russell is on the Category 2 Plan and states she is following her eating plan approximately 80% of the time. Rachael Russell states she is walking 1-2 mile for 45 minutes 4 times per week.  Today's visit was #: 9 Starting weight: 192 lbs Starting date: 10/22/2020 Today's weight: 184 lbs Today's date: 04/02/2021 Total lbs lost to date: 8 Total lbs lost since last in-office visit: 2  Interim History: Rachael Russell is here for a follow up office visit.  We reviewed her meal plan and questions were answered.  Patient's food recall appears to be accurate and consistent with what is on plan when she is following it. She denies sweet cravings or hunger at all.  Subjective:   1. Essential hypertension Rachael Russell's blood pressure is stable today, in the low normal range. She is taking Norvasc, hydrochlorothiazide, and lisinopril.   Assessment/Plan:  No orders of the defined types were placed in this encounter.   There are no discontinued medications.   No orders of the defined types were placed in this encounter.    1. Essential hypertension Rachael Russell will keep a blood pressure log and bring in to her next office visit. She will continue working on healthy weight loss and exercise to improve blood pressure control. We will watch for signs of hypotension as she continues her lifestyle modifications.  2. Obesity with current BMI 30.8 Rachael Russell is currently in the action stage of change. As such, her goal is to continue with weight loss efforts. She has agreed to the Category 2 Plan.   We will recheck fasting labs at her next office visit. Rachael Russell has a CPE with her primary care physician in 1 week prior to her follow up office visit with [place]. I informed her that will will not repeat labs that are already done.  Exercise goals: As is.  Behavioral  modification strategies: increasing lean protein intake, decreasing simple carbohydrates, and planning for success.  Rachael Russell has agreed to follow-up with our clinic in 2 to 3 weeks. She was informed of the importance of frequent follow-up visits to maximize her success with intensive lifestyle modifications for her multiple health conditions.   Objective:   Blood pressure 105/67, pulse 75, temperature 98.1 F (36.7 C), height '5\' 5"'$  (1.651 m), weight 185 lb (83.9 kg), last menstrual period 04/25/2019, SpO2 98 %. Body mass index is 30.79 kg/m.  General: Cooperative, alert, well developed, in no acute distress. HEENT: Conjunctivae and lids unremarkable. Cardiovascular: Regular rhythm.  Lungs: Normal work of breathing. Neurologic: No focal deficits.   Lab Results  Component Value Date   CREATININE 0.97 04/04/2021   BUN 22 04/04/2021   NA 141 04/04/2021   K 4.1 04/04/2021   CL 101 04/04/2021   CO2 25 04/04/2021   Lab Results  Component Value Date   ALT 25 04/04/2021   AST 24 04/04/2021   ALKPHOS 97 04/04/2021   BILITOT 0.3 04/04/2021   Lab Results  Component Value Date   HGBA1C 5.8 (H) 04/04/2021   HGBA1C 5.8 (H) 10/22/2020   Lab Results  Component Value Date   INSULIN 11.3 10/22/2020   Lab Results  Component Value Date   TSH 2.410 04/04/2021   Lab Results  Component Value Date   CHOL 209 (H) 04/04/2021   HDL 68 04/04/2021   LDLCALC 129 (H) 04/04/2021  LDLDIRECT 149.9 05/11/2011   TRIG 65 04/04/2021   CHOLHDL 3.1 04/04/2021   Lab Results  Component Value Date   VD25OH 53.7 04/04/2021   VD25OH 46.4 10/22/2020   VD25OH 47.5 04/10/2020   Lab Results  Component Value Date   WBC 7.8 04/04/2021   HGB 13.5 04/04/2021   HCT 40.6 04/04/2021   MCV 90 04/04/2021   PLT 293 04/04/2021   Lab Results  Component Value Date   IRON 84 04/04/2021   FERRITIN 88 04/28/2017   Attestation Statements:   Reviewed by clinician on day of visit: allergies, medications,  problem list, medical history, surgical history, family history, social history, and previous encounter notes.  Time spent on visit including pre-visit chart review and post-visit care and charting was 23 minutes.    Wilhemena Durie, am acting as transcriptionist for [employer], DO.  I have reviewed the above documentation for accuracy and completeness, and I agree with the above. Marjory Sneddon, D.O.  The [place] was signed into law in 2016 which includes the topic of electronic health records.  This provides immediate access to information in MyChart.  This includes consultation notes, operative notes, office notes, lab results and pathology reports.  If you have any questions about what you read please let us know at your next visit so we can discuss your concerns and take corrective action if need be.  We are right here with you.

## 2021-04-10 ENCOUNTER — Ambulatory Visit (INDEPENDENT_AMBULATORY_CARE_PROVIDER_SITE_OTHER): Payer: Managed Care, Other (non HMO) | Admitting: Family Medicine

## 2021-04-10 ENCOUNTER — Other Ambulatory Visit: Payer: Self-pay

## 2021-04-10 ENCOUNTER — Encounter: Payer: Self-pay | Admitting: Family Medicine

## 2021-04-10 VITALS — BP 122/86 | HR 55 | Temp 98.1°F | Ht 64.25 in | Wt 188.3 lb

## 2021-04-10 DIAGNOSIS — E7800 Pure hypercholesterolemia, unspecified: Secondary | ICD-10-CM | POA: Diagnosis not present

## 2021-04-10 DIAGNOSIS — I10 Essential (primary) hypertension: Secondary | ICD-10-CM | POA: Diagnosis not present

## 2021-04-10 DIAGNOSIS — Z0000 Encounter for general adult medical examination without abnormal findings: Secondary | ICD-10-CM | POA: Diagnosis not present

## 2021-04-10 DIAGNOSIS — R7303 Prediabetes: Secondary | ICD-10-CM | POA: Diagnosis not present

## 2021-04-10 DIAGNOSIS — M21611 Bunion of right foot: Secondary | ICD-10-CM

## 2021-04-10 DIAGNOSIS — E559 Vitamin D deficiency, unspecified: Secondary | ICD-10-CM

## 2021-04-10 NOTE — Patient Instructions (Addendum)
If you want to try increasing your atorvastatin in the future let us know   Take care of yourself   Great job with weight loss so far  Keep walking   Labs are stable   Use sun protection  Get a flu shot in the fall   For swelling -watch your sodium intake   If your blood pressure starts to run low often - let us know

## 2021-04-10 NOTE — Assessment & Plan Note (Signed)
Pt may want a podiatry ref in the future Adv shoes that are wide with tall toe box if possible

## 2021-04-10 NOTE — Assessment & Plan Note (Signed)
Lab Results  Component Value Date   HGBA1C 5.8 (H) 04/04/2021   disc imp of low glycemic diet and wt loss to prevent DM2  Enc pt to continue with the healthy weight clinic

## 2021-04-10 NOTE — Assessment & Plan Note (Signed)
Disc goals for lipids and reasons to control them Rev last labs with pt Rev low sat fat diet in detail LDL is stable, HDL down a bit but still good  Taking atorvastatin 10 mg daily  Good diet -commended  I think that an increase to 20 mg would be wise in attempt to get LDL under 100  Pt does not want to at this time but will call if she changes her mid

## 2021-04-10 NOTE — Assessment & Plan Note (Signed)
Reviewed health habits including diet and exercise and skin cancer prevention Reviewed appropriate screening tests for age  Also reviewed health mt list, fam hx and immunization status , as well as social and family history   See HPI Labs reviewed  Commended wt loss so far  Enc to consider shingrix vaccine, plans to check on coverage covid immunized with booster  Planning flu shot in the fall Mammogram utd and breast exam stable (strong family history)  Pap utd  Colonoscopy 2018 with 10 y recall Enc her to continue the healthy weight clinic program

## 2021-04-10 NOTE — Assessment & Plan Note (Addendum)
bp in fair control at this time  BP Readings from Last 1 Encounters:  04/10/21 122/86   No changes needed Most recent labs reviewed  Disc lifstyle change with low sodium diet and exercise  Plan to continue Amlodipine 5 mg daily  hctz 25 mg daily  lsinopril 40 mg daily  bp was low one day  If this reoccurs inst to call and we will cut back on medication

## 2021-04-10 NOTE — Progress Notes (Signed)
Subjective:    Patient ID: Rachael Russell, female    DOB: 04-03-1966, 55 y.o.   MRN: [id number]  This visit occurred during the SARS-CoV-2 public health emergency.  Safety protocols were in place, including screening questions prior to the visit, additional usage of staff PPE, and extensive cleaning of exam room while observing appropriate contact time as indicated for disinfecting solutions.   HPI Here for health maintenance exam and to review chronic medical problems    Wt Readings from Last 3 Encounters:  04/10/21 188 lb 5 oz (85.4 kg)  04/02/21 185 lb (83.9 kg)  03/19/21 186 lb (84.4 kg)   32.07 kg/m  [facility]  Has lost 8 lb  Likes it there  Learning a lot  Needs more time for self care   Exercise-walking   Working a lot  Has been moving her kids   One of her sons has a cataract at 69    Zoster status - interested in vaccine  Covid immunized  Booster-in July  Flu shot -fall Tdap 9/20   Mammogram 11/21 Self breast exam -hard for her to tell, dense tissue  H/o ductal papillomatosis of breast  Family h/o breast cancer , she herself has had genetic testing  Menopause is tough-sees Dr Kennon Rounds  No hrt due to family h/o breast cancer   Wants to do a breast exam   Pap 3/22 nl with neg HPV  Pt sees gyn   Colonoscopy 2/18 -nl   HTN bp is stable today  No cp or palpitations or headaches or edema  No side effects to medicines  BP Readings from Last 3 Encounters:  04/10/21 122/86  04/02/21 105/67  03/19/21 132/82     Amlodipine 5 mg daily  Hctz 25 mg daily  Lisinopril 40 mg daily   Blood pressure was 90s/70s one daily   Hyperlipidemia Lab Results  Component Value Date   CHOL 209 (H) 04/04/2021   CHOL 217 (H) 10/22/2020   CHOL 222 (H) 04/10/2020   Lab Results  Component Value Date   HDL 68 04/04/2021   HDL 76 10/22/2020   HDL 82 04/10/2020   Lab Results  Component Value Date   LDLCALC 129 (H) 04/04/2021    LDLCALC 128 (H) 10/22/2020   LDLCALC 127 (H) 04/10/2020   Lab Results  Component Value Date   TRIG 65 04/04/2021   TRIG 75 10/22/2020   TRIG 73 04/10/2020   Lab Results  Component Value Date   CHOLHDL 3.1 04/04/2021   CHOLHDL 2.9 10/22/2020   CHOLHDL 2.7 04/10/2020   Lab Results  Component Value Date   LDLDIRECT 149.9 05/11/2011   LDLDIRECT 123.2 01/03/2007   Atorvastatin 10 mg daily  HDL is down- ? Menopause  Diet has been fair - a little worse in the past 3 wk Avoids fried foods   Sweets are the biggest problem  Is getting away from them  Some pasta   Eating protein and veggies    Prediabetes Lab Results  Component Value Date   HGBA1C 5.8 (H) 04/04/2021   Stable from last year  Less sweets and pasta   Vit D def Takes 4000 iu daily  Level of 53.7  Past iron def Lab Results  Component Value Date   WBC 7.8 04/04/2021   HGB 13.5 04/04/2021   HCT 40.6 04/04/2021   MCV 90 04/04/2021   PLT 293 04/04/2021   Lab Results  Component Value Date  IRON 84 04/04/2021   FERRITIN 88 04/28/2017   Lab Results  Component Value Date   CREATININE 0.97 04/04/2021   BUN 22 04/04/2021   NA 141 04/04/2021   K 4.1 04/04/2021   CL 101 04/04/2021   CO2 25 04/04/2021    Lab Results  Component Value Date   ALT 25 04/04/2021   AST 24 04/04/2021   ALKPHOS 97 04/04/2021   BILITOT 0.3 04/04/2021   Lab Results  Component Value Date   TSH 2.410 04/04/2021    Drinking lots of water   Bunion of R foot/big toe  Will call for referral when ready to have it looked it   Was swollen after eating frozen meals (high sodium)  Got away from them    Patient Active Problem List   Diagnosis Date Noted   Bunion, right foot 04/10/2021   Depression 03/24/2021   At risk for diabetes mellitus 12/17/2020   Gastroesophageal reflux disease 11/19/2020   At risk for impaired metabolic function [id number]   Menopause 11/14/2020   Prediabetes 11/05/2020   Other hyperlipidemia  11/05/2020   At risk for deficient intake of food 11/05/2020   Class 1 obesity with serious comorbidity and body mass index (BMI) of 31.0 to 31.9 in adult 10/22/2020   Pain in right wrist 09/12/2020   Class 1 obesity due to excess calories with body mass index (BMI) of 33.0 to 33.9 in adult 05/08/2020   Screening mammogram, encounter for 04/25/2019   Dyspnea 10/31/2018   Low back pain 08/08/2018   Genetic testing 12/01/2017   Family history of breast cancer    Family history of prostate cancer    Papilloma of left breast 07/09/2017   Pedal edema 02/23/2017   Vitamin D deficiency 04/26/2015   Fatigue 11/06/2014   Hemorrhoids, external 10/13/2013   Cervical disc disorder with radiculopathy of cervical region 09/15/2013   History of herpes zoster 03/07/2013   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   Routine general medical examination at a health care facility 05/11/2011   Allergic rhinitis 12/19/2010   Fibrocystic breast disease 12/19/2010   GERD 04/14/2010   SINUSITIS, CHRONIC FRONTAL 04/22/2007   Hyperlipidemia 12/21/2006   Essential hypertension 12/21/2006   Past Medical History:  Diagnosis Date   Anemia    Anxiety    Back pain    Bulging lumbar disc    Family history of adverse reaction to anesthesia    mother has difficult intubation   Family history of breast cancer    Family history of prostate cancer    Fibroadenoma of breast, left    Fibrocystic breast, right    GERD (gastroesophageal reflux disease)    Heartburn    Hyperlipidemia    Hypertension    Irritable bowel syndrome (IBS)    Knee pain    Lactose intolerance    Menopause    Menorrhagia    Obesity    Papilloma of left breast 07/09/2017   SOBOE (shortness of breath on exertion)    Swelling of both lower extremities    Vitamin D deficiency    Wears glasses    Past Surgical History:  Procedure Laterality Date   BREAST DUCTAL SYSTEM EXCISION Left 11/18/2012   Procedure: EXCISION DUCTAL SYSTEM  BREAST;  Surgeon: [name], MD;  Location: [facility];  Service: General;  Laterality: Left;   BREAST LUMPECTOMY WITH NEEDLE LOCALIZATION Left 11/18/2012   Procedure: excise ductal system left breast. subarealar. left partial mastectomy with radiographic guidance;  Surgeon: [name], MD;  Location: [facility];  Service: General;  Laterality: Left;  excise ductal system left breast. subarealar. left partial mastectomy with radiographic guidance   BREAST LUMPECTOMY WITH NEEDLE LOCALIZATION Left 03/27/2015   Procedure: LEFT BREAST LUMPECTOMY  WITH NEEDLE LOCALIZATION TIMES TWO;  Surgeon: Fanny Skates, MD;  Location: [facility];  Service: General;  Laterality: Left;   BREAST LUMPECTOMY WITH RADIOACTIVE SEED LOCALIZATION Left 07/09/2017   Procedure: LEFT BREAST LUMPECTOMY WITH RADIOACTIVE SEED LOCALIZATION;  Surgeon: Fanny Skates, MD;  Location: [facility];  Service: General;  Laterality: Left;  ERAS PATHWAY   CESAREAN SECTION  [id number]   DILATION AND CURETTAGE OF [place] N/A 08/05/2016   Procedure: DILATATION & CURETTAGE/HYSTEROSCOPY WITH NOVASURE ABLATION;  Surgeon: Princess Bruins, MD;  Location: [place];  Service: Gynecology;  Laterality: N/A;   TONSILLECTOMY  age 34   UMBILICAL HERNIA REPAIR  2006   Social History   Tobacco Use   Smoking status: Never   Smokeless tobacco: Never  Vaping Use   Vaping Use: Never used  Substance Use Topics   Alcohol use: Not Currently   Drug use: No   Family History  Problem Relation Age of Onset   Hypertension Mother    Arthritis Mother        osteoarthritis   Breast cancer Mother 40   Hyperlipidemia Mother    Cancer Mother    Obesity Mother    Hypertension Father    Arthritis Father        osteoarthritis   Hyperlipidemia Father    Breast cancer Maternal Grandmother 29       bilateral breast cancer    Breast cancer Paternal Aunt 30       bilateral breast cancer   Prostate cancer Paternal Uncle    Tuberculosis Paternal Grandmother    Prostate cancer Paternal Uncle    Allergies  Allergen Reactions   Ferrous Sulfate     constipation   Penicillins Other (See Comments)    Unknown childhood reaction   Prilosec [Omeprazole Magnesium]     Dizziness    Chlorhexidine Gluconate Rash   Current Outpatient Medications on File Prior to Visit  Medication Sig Dispense Refill   amLODipine (NORVASC) 5 MG tablet TAKE 1 TABLET BY MOUTH  DAILY 90 tablet 3   atorvastatin (LIPITOR) 10 MG tablet TAKE 1 TABLET BY MOUTH  DAILY 90 tablet 3   Cholecalciferol 2000 units TABS Take 2 tablets by mouth daily.     cyclobenzaprine (FLEXERIL) 10 MG tablet Take 10 mg by mouth 3 (three) times daily as needed for muscle spasms.     famotidine (PEPCID) 20 MG tablet Take 1 tablet (20 mg total) by mouth 2 (two) times daily as needed for heartburn or indigestion. 180 tablet 0   hydrochlorothiazide (HYDRODIURIL) 25 MG tablet TAKE 1 TABLET BY MOUTH  DAILY 90 tablet 3   lisinopril (ZESTRIL) 40 MG tablet TAKE 1 TABLET BY MOUTH  DAILY 90 tablet 3   Potassium 99 MG TABS Take 1 tablet by mouth daily.     triamcinolone cream (KENALOG) 0.1 % APPLY TO THE AFFECTED AREA 1 TO 2 TIMES DAILY     UNABLE TO FIND Med Name: Renew life probiotic     No current facility-administered medications on file prior to visit.    Review of Systems  Constitutional:  Negative for activity change, appetite change, fatigue, fever and  unexpected weight change.       Busy schedule   HENT:  Negative for congestion, ear pain, rhinorrhea, sinus pressure and sore throat.   Eyes:  Negative for pain, redness and visual disturbance.  Respiratory:  Negative for cough, shortness of breath and wheezing.   Cardiovascular:  Negative for chest pain and palpitations.  Gastrointestinal:  Negative for abdominal pain, blood in stool, constipation and diarrhea.   Endocrine: Negative for polydipsia and polyuria.  Genitourinary:  Negative for dysuria, frequency and urgency.  Musculoskeletal:  Negative for arthralgias, back pain and myalgias.  Skin:  Negative for pallor and rash.  Allergic/Immunologic: Negative for environmental allergies.  Neurological:  Negative for dizziness, syncope and headaches.  Hematological:  Negative for adenopathy. Does not bruise/bleed easily.  Psychiatric/Behavioral:  Negative for decreased concentration and dysphoric mood. The patient is not nervous/anxious.       Objective:   Physical Exam Constitutional:      General: She is not in acute distress.    Appearance: Normal appearance. She is well-developed. She is obese. She is not ill-appearing or diaphoretic.  HENT:     Head: Normocephalic and atraumatic.     Right Ear: Tympanic membrane, ear canal and external ear normal.     Left Ear: Tympanic membrane, ear canal and external ear normal.     Nose: Nose normal. No congestion.     Mouth/Throat:     Mouth: Mucous membranes are moist.     Pharynx: Oropharynx is clear. No posterior oropharyngeal erythema.  Eyes:     General: No scleral icterus.    Extraocular Movements: Extraocular movements intact.     Conjunctiva/sclera: Conjunctivae normal.     Pupils: Pupils are equal, round, and reactive to light.  Neck:     Thyroid: No thyromegaly.     Vascular: No carotid bruit or JVD.  Cardiovascular:     Rate and Rhythm: Normal rate and regular rhythm.     Pulses: Normal pulses.     Heart sounds: Normal heart sounds.    No gallop.  Pulmonary:     Effort: Pulmonary effort is normal. No respiratory distress.     Breath sounds: Normal breath sounds. No wheezing.     Comments: Good air exch Chest:     Chest wall: No tenderness.  Abdominal:     General: Bowel sounds are normal. There is no distension or abdominal bruit.     Palpations: Abdomen is soft. There is no mass.     Tenderness: There is no abdominal  tenderness.     Hernia: No hernia is present.  Genitourinary:    Comments: Breast exam: No mass, nodules, thickening, tenderness, bulging, retraction, inflamation, nipple discharge or skin changes noted.  No axillary or clavicular LA.     Musculoskeletal:        General: No tenderness. Normal range of motion.     Cervical back: Normal range of motion and neck supple. No rigidity. No muscular tenderness.     Right lower leg: No edema.     Left lower leg: No edema.  Lymphadenopathy:     Cervical: No cervical adenopathy.  Skin:    General: Skin is warm and dry.     Coloration: Skin is not pale.     Findings: No erythema or rash.     Comments: Few lentigines  Few dermatofibromas on legs   Neurological:     Mental Status: She is alert. Mental status is at baseline.  Cranial Nerves: No cranial nerve deficit.     Motor: No abnormal muscle tone.     Coordination: Coordination normal.     Gait: Gait normal.     Deep Tendon Reflexes: Reflexes are normal and symmetric. Reflexes normal.  Psychiatric:        Mood and Affect: Mood normal.     Comments: Pleasant           Assessment & Plan:   Problem List Items Addressed This Visit       Cardiovascular and Mediastinum   Essential hypertension    bp in fair control at this time  BP Readings from Last 1 Encounters:  04/10/21 122/86  No changes needed Most recent labs reviewed  Disc lifstyle change with low sodium diet and exercise  Plan to continue Amlodipine 5 mg daily  hctz 25 mg daily  lsinopril 40 mg daily  bp was low one day  If this reoccurs inst to call and we will cut back on medication         Musculoskeletal and Integument   Bunion, right foot    Pt may want a podiatry ref in the future Adv shoes that are wide with tall toe box if possible        Other   Hyperlipidemia    Disc goals for lipids and reasons to control them Rev last labs with pt Rev low sat fat diet in detail LDL is stable, HDL down a bit  but still good  Taking atorvastatin 10 mg daily  Good diet -commended  I think that an increase to 20 mg would be wise in attempt to get LDL under 100  Pt does not want to at this time but will call if she changes her mid        Routine general medical examination at a health care facility - Primary    Reviewed health habits including diet and exercise and skin cancer prevention Reviewed appropriate screening tests for age  Also reviewed health mt list, fam hx and immunization status , as well as social and family history   See HPI Labs reviewed  Commended wt loss so far  Enc to consider shingrix vaccine, plans to check on coverage covid immunized with booster  Planning flu shot in the fall Mammogram utd and breast exam stable (strong family history)  Pap utd  Colonoscopy 2018 with 10 y recall Enc her to continue the healthy weight clinic program        Vitamin D deficiency    Vitamin D level is therapeutic with current supplementation Level of 53.7 Disc importance of this to bone and overall health  Continue 4000 iu D3 daily       Prediabetes    Lab Results  Component Value Date   HGBA1C 5.8 (H) 04/04/2021  disc imp of low glycemic diet and wt loss to prevent DM2  Enc pt to continue with the healthy weight clinic

## 2021-04-10 NOTE — Assessment & Plan Note (Signed)
Vitamin D level is therapeutic with current supplementation Level of 53.7 Disc importance of this to bone and overall health  Continue 4000 iu D3 daily

## 2021-04-11 ENCOUNTER — Encounter: Payer: Managed Care, Other (non HMO) | Admitting: Family Medicine

## 2021-04-14 ENCOUNTER — Encounter (INDEPENDENT_AMBULATORY_CARE_PROVIDER_SITE_OTHER): Payer: Self-pay | Admitting: Family Medicine

## 2021-04-14 ENCOUNTER — Ambulatory Visit (INDEPENDENT_AMBULATORY_CARE_PROVIDER_SITE_OTHER): Payer: Managed Care, Other (non HMO) | Admitting: Family Medicine

## 2021-04-14 ENCOUNTER — Other Ambulatory Visit: Payer: Self-pay

## 2021-04-14 VITALS — BP 132/78 | HR 70 | Temp 98.2°F | Ht 65.0 in | Wt 183.0 lb

## 2021-04-14 DIAGNOSIS — E669 Obesity, unspecified: Secondary | ICD-10-CM | POA: Diagnosis not present

## 2021-04-14 DIAGNOSIS — E7849 Other hyperlipidemia: Secondary | ICD-10-CM | POA: Diagnosis not present

## 2021-04-14 DIAGNOSIS — Z6831 Body mass index (BMI) 31.0-31.9, adult: Secondary | ICD-10-CM

## 2021-04-14 DIAGNOSIS — R7303 Prediabetes: Secondary | ICD-10-CM

## 2021-04-14 DIAGNOSIS — E559 Vitamin D deficiency, unspecified: Secondary | ICD-10-CM

## 2021-04-14 NOTE — Progress Notes (Signed)
Chief Complaint:   OBESITY Rachael Russell is here to discuss her progress with her obesity treatment plan along with follow-up of her obesity related diagnoses. Rachael Russell is on the Category 2 Plan and states she is following her eating plan approximately 75% of the time. Rachael Russell states she is walking 40 minutes 3-4 times per week.  Today's visit was #: 10 Starting weight: 192 lbs Starting date: 10/22/2020 Today's weight: 183 lbs Today's date: 04/14/2021 Total lbs lost to date: 9 Total lbs lost since last in-office visit: 1  Interim History: Rachael Russell is weighing proteins before cooking them. She reports much less sweets cravings than prior. She had recent labs done at [facility] office.  Assessment/Plan:  No orders of the defined types were placed in this encounter.   There are no discontinued medications.   No orders of the defined types were placed in this encounter.    1. Other hyperlipidemia Discussed labs with patient today. Course: Not at goal. Lipid-lowering medications: Lipitor.  Recent labs showed no change in LDL from prior. Pt declined increase in dose of her Lipitor.    Plan: Pt educated on LDL has increased the past several months, despite decrease in sat/trans fats meal plan, but pt declines increase in dose today and states she will pray about it and continue meal plan.   Lab Results  Component Value Date   CHOL 209 (H) 04/04/2021   HDL 68 04/04/2021   LDLCALC 129 (H) 04/04/2021   LDLDIRECT 149.9 05/11/2011   TRIG 65 04/04/2021   CHOLHDL 3.1 04/04/2021   Lab Results  Component Value Date   ALT 25 04/04/2021   AST 24 04/04/2021   ALKPHOS 97 04/04/2021   BILITOT 0.3 04/04/2021   The 10-year ASCVD risk score Rachael Bussing DC Jr., et al., 2013) is: 4.6%   Values used to calculate the score:     Age: 55 years     Sex: Female     Is Non-Hispanic African American: Yes     Diabetic: No     Tobacco smoker: No     Systolic Blood Pressure: [id number] mmHg     Is BP treated: Yes     HDL  Cholesterol: 68 mg/dL     Total Cholesterol: 209 mg/dL  2. Pre-diabetes Discussed labs with patient today. Not at goal. Goal is HgbA1c < 5.7.  Medication: None.    Plan:  She will continue to focus on protein-rich, low simple carbohydrate foods. We reviewed the importance of hydration, regular exercise for stress reduction, and restorative sleep.   Lab Results  Component Value Date   HGBA1C 5.8 (H) 04/04/2021   Lab Results  Component Value Date   INSULIN 11.3 10/22/2020   3. Vitamin D deficiency Discussed labs with patient today. At goal.   Plan: Continue current OTC vitamin D 2,000 IU daily.  Follow-up for routine testing of Vitamin D, at least 2-3 times per year to avoid over-replacement.  Lab Results  Component Value Date   VD25OH 53.7 04/04/2021   VD25OH 46.4 10/22/2020   VD25OH 47.5 04/10/2020   4. Obesity with current BMI of 30.5  Rachael Russell is currently in the action stage of change. As such, her goal is to continue with weight loss efforts. She has agreed to the Category 2 Plan.   Exercise goals:  As is  Behavioral modification strategies: increasing lean protein intake and decreasing simple carbohydrates.  Rachael Russell has agreed to follow-up with our clinic in 2-3 weeks. She was informed of  the importance of frequent follow-up visits to maximize her success with intensive lifestyle modifications for her multiple health conditions.   Objective:   Blood pressure 132/78, pulse 70, temperature 98.2 F (36.8 C), height '5\' 5"'$  (1.651 m), weight 183 lb (83 kg), last menstrual period 04/25/2019, SpO2 99 %. Body mass index is 30.45 kg/m.  General: Cooperative, alert, well developed, in no acute distress. HEENT: Conjunctivae and lids unremarkable. Cardiovascular: Regular rhythm.  Lungs: Normal work of breathing. Neurologic: No focal deficits.   Lab Results  Component Value Date   CREATININE 0.97 04/04/2021   BUN 22 04/04/2021   NA 141 04/04/2021   K 4.1 04/04/2021   CL  101 04/04/2021   CO2 25 04/04/2021   Lab Results  Component Value Date   ALT 25 04/04/2021   AST 24 04/04/2021   ALKPHOS 97 04/04/2021   BILITOT 0.3 04/04/2021   Lab Results  Component Value Date   HGBA1C 5.8 (H) 04/04/2021   HGBA1C 5.8 (H) 10/22/2020   Lab Results  Component Value Date   INSULIN 11.3 10/22/2020   Lab Results  Component Value Date   TSH 2.410 04/04/2021   Lab Results  Component Value Date   CHOL 209 (H) 04/04/2021   HDL 68 04/04/2021   LDLCALC 129 (H) 04/04/2021   LDLDIRECT 149.9 05/11/2011   TRIG 65 04/04/2021   CHOLHDL 3.1 04/04/2021   Lab Results  Component Value Date   VD25OH 53.7 04/04/2021   VD25OH 46.4 10/22/2020   VD25OH 47.5 04/10/2020   Lab Results  Component Value Date   WBC 7.8 04/04/2021   HGB 13.5 04/04/2021   HCT 40.6 04/04/2021   MCV 90 04/04/2021   PLT 293 04/04/2021   Lab Results  Component Value Date   IRON 84 04/04/2021   FERRITIN 88 04/28/2017    Attestation Statements:   Reviewed by clinician on day of visit: allergies, medications, problem list, medical history, surgical history, family history, social history, and previous encounter notes.  Time spent on visit including pre-visit chart review and post-visit care and charting was 30 minutes.   Coral Ceo, CMA, am acting as transcriptionist for [employer], DO.  I have reviewed the above documentation for accuracy and completeness, and I agree with the above. Marjory Sneddon, D.O.  The [place] was signed into law in 2016 which includes the topic of electronic health records.  This provides immediate access to information in MyChart.  This includes consultation notes, operative notes, office notes, lab results and pathology reports.  If you have any questions about what you read please let us know at your next visit so we can discuss your concerns and take corrective action if need be.  We are right here with you.

## 2021-04-30 ENCOUNTER — Other Ambulatory Visit: Payer: Self-pay

## 2021-04-30 ENCOUNTER — Ambulatory Visit (INDEPENDENT_AMBULATORY_CARE_PROVIDER_SITE_OTHER): Payer: Managed Care, Other (non HMO)

## 2021-04-30 ENCOUNTER — Encounter: Payer: Self-pay | Admitting: Podiatry

## 2021-04-30 ENCOUNTER — Ambulatory Visit: Payer: Managed Care, Other (non HMO) | Admitting: Podiatry

## 2021-04-30 DIAGNOSIS — M21611 Bunion of right foot: Secondary | ICD-10-CM

## 2021-04-30 DIAGNOSIS — M21619 Bunion of unspecified foot: Secondary | ICD-10-CM

## 2021-04-30 DIAGNOSIS — M21612 Bunion of left foot: Secondary | ICD-10-CM | POA: Diagnosis not present

## 2021-04-30 NOTE — Progress Notes (Signed)
Subjective:   Patient ID: Rachael Russell, female   DOB: 55 y.o.   MRN: [id number]   HPI Patient presents stating she has a painful bunion on her right foot and she feels like she walks differently and she is developing some pain in her leg also states that its been going on for around a year she is tried wider shoes she is tried soaking it and she is tried oral anti-inflammatories without relief.  Patient does not smoke likes to be active   Review of Systems  All other systems reviewed and are negative.      Objective:  Physical Exam Vitals and nursing note reviewed.  Constitutional:      Appearance: She is well-developed.  Pulmonary:     Effort: Pulmonary effort is normal.  Musculoskeletal:        General: Normal range of motion.  Skin:    General: Skin is warm.  Neurological:     Mental Status: She is alert.    Neurovascular status intact muscle strength was found to be adequate range of motion adequate with prominent first metatarsal head right redness around the joint over the left with mild deviation of the hallux against the second toe.  Patient is found to have good digital perfusion well oriented x3     Assessment:  Significant symptomatic structural bunion deformity right that she is tried numerous conservative treatments without relief over left     Plan:  H&P x-rays reviewed condition discussed at great length.  Patient would like to have this corrected would like to get it done soon due to her work schedule and wants to have surgery.  At this point I allowed her to go over consent form for distal osteotomy right spending a great deal of time going over alternative treatments complications with surgery.  Patient wants to pursue this option after extensive review signed consent form and understands total recovery can take upwards of 6 months and at this point air fracture walker dispensed with all instructions on usage.  She was given all preoperative instructions and  encouraged to call with any questions concerns which may arise  X-rays indicate elevation of the intermetatarsal angle right of approximate 15 degrees tibial sesamoid shift and mild deviation hallux against the second toe with minimal deformity on the left foot

## 2021-05-06 ENCOUNTER — Ambulatory Visit (INDEPENDENT_AMBULATORY_CARE_PROVIDER_SITE_OTHER): Payer: Managed Care, Other (non HMO) | Admitting: Bariatrics

## 2021-05-06 ENCOUNTER — Other Ambulatory Visit: Payer: Self-pay

## 2021-05-06 ENCOUNTER — Encounter (INDEPENDENT_AMBULATORY_CARE_PROVIDER_SITE_OTHER): Payer: Self-pay | Admitting: Bariatrics

## 2021-05-06 VITALS — BP 142/88 | HR 74 | Temp 97.8°F | Ht 65.0 in | Wt 185.0 lb

## 2021-05-06 DIAGNOSIS — R7303 Prediabetes: Secondary | ICD-10-CM | POA: Diagnosis not present

## 2021-05-06 DIAGNOSIS — I10 Essential (primary) hypertension: Secondary | ICD-10-CM

## 2021-05-06 DIAGNOSIS — Z6831 Body mass index (BMI) 31.0-31.9, adult: Secondary | ICD-10-CM | POA: Diagnosis not present

## 2021-05-06 DIAGNOSIS — E669 Obesity, unspecified: Secondary | ICD-10-CM

## 2021-05-07 NOTE — Progress Notes (Signed)
Chief Complaint:   OBESITY Rachael Russell is here to discuss her progress with her obesity treatment plan along with follow-up of her obesity related diagnoses. Rachael Russell is on the Category 2 Plan and states she is following her eating plan approximately 70% of the time. Rachael Russell states she is walking 3 miles 4 times per week.  Today's visit was #: 11 Starting weight: 192 lbs Starting date: 10/22/2020 Today's weight: 185 lbs Today's date: 05/06/2021 Total lbs lost to date: 7 lbs Total lbs lost since last in-office visit: 0  Interim History: Daily is up 2 lbs since her last visit. She is up about 11/2 lbs per  the bioimpedance. She is still not getting in enough meat.   Subjective:   1. Prediabetes Rachael Russell is currently not on medications.  2. Essential hypertension Rachael Russell is currently taking Norvasc. Her hypertension is reasonably well controlled. It was slightly increased today.  Assessment/Plan:   1. Prediabetes Rachael Russell will continue to work on weight loss, exercise, and decreasing simple carbohydrates to help decrease the risk of diabetes. She will increase healthy fats and protein and remain active.   2. Essential hypertension Rachael Russell will continue medications. She is working on healthy weight loss and exercise to improve blood pressure control. We will watch for signs of hypotension as she continues her lifestyle modifications.   3. Obesity with current BMI of 30.9 Rachael Russell is currently in the action stage of change. As such, her goal is to continue with weight loss efforts. She has agreed to the Category 2 Plan.   Rachael Russell will continue meal planning. She will be mindful eating.   Exercise goals:  As is.  Behavioral modification strategies: increasing lean protein intake, decreasing simple carbohydrates, increasing vegetables, increasing water intake, decreasing eating out, no skipping meals, meal planning and cooking strategies, keeping healthy foods in the home, and planning for  success.  Rachael Russell has agreed to follow-up with our clinic in 3 weeks with Dr. Raliegh Scarlet or Jake Bathe, [place]. She was informed of the importance of frequent follow-up visits to maximize her success with intensive lifestyle modifications for her multiple health conditions.   Objective:   Blood pressure (!) 142/88, pulse 74, temperature 97.8 F (36.6 C), height '5\' 5"'$  (1.651 m), weight 185 lb (83.9 kg), last menstrual period 04/25/2019, SpO2 98 %. Body mass index is 30.79 kg/m.  General: Cooperative, alert, well developed, in no acute distress. HEENT: Conjunctivae and lids unremarkable. Cardiovascular: Regular rhythm.  Lungs: Normal work of breathing. Neurologic: No focal deficits.   Lab Results  Component Value Date   CREATININE 0.97 04/04/2021   BUN 22 04/04/2021   NA 141 04/04/2021   K 4.1 04/04/2021   CL 101 04/04/2021   CO2 25 04/04/2021   Lab Results  Component Value Date   ALT 25 04/04/2021   AST 24 04/04/2021   ALKPHOS 97 04/04/2021   BILITOT 0.3 04/04/2021   Lab Results  Component Value Date   HGBA1C 5.8 (H) 04/04/2021   HGBA1C 5.8 (H) 10/22/2020   Lab Results  Component Value Date   INSULIN 11.3 10/22/2020   Lab Results  Component Value Date   TSH 2.410 04/04/2021   Lab Results  Component Value Date   CHOL 209 (H) 04/04/2021   HDL 68 04/04/2021   LDLCALC 129 (H) 04/04/2021   LDLDIRECT 149.9 05/11/2011   TRIG 65 04/04/2021   CHOLHDL 3.1 04/04/2021   Lab Results  Component Value Date   VD25OH 53.7 04/04/2021   VD25OH  46.4 10/22/2020   VD25OH 47.5 04/10/2020   Lab Results  Component Value Date   WBC 7.8 04/04/2021   HGB 13.5 04/04/2021   HCT 40.6 04/04/2021   MCV 90 04/04/2021   PLT 293 04/04/2021   Lab Results  Component Value Date   IRON 84 04/04/2021   FERRITIN 88 04/28/2017    Attestation Statements:   Reviewed by clinician on day of visit: allergies, medications, problem list, medical history, surgical history, family history, social  history, and previous encounter notes.   I, Lizbeth Bark, RMA, am acting as Location manager for [employer], DO.   I have reviewed the above documentation for accuracy and completeness, and I agree with the above. Jearld Lesch, DO

## 2021-05-08 ENCOUNTER — Encounter (INDEPENDENT_AMBULATORY_CARE_PROVIDER_SITE_OTHER): Payer: Self-pay | Admitting: Bariatrics

## 2021-05-21 ENCOUNTER — Ambulatory Visit (INDEPENDENT_AMBULATORY_CARE_PROVIDER_SITE_OTHER): Payer: Managed Care, Other (non HMO) | Admitting: Family Medicine

## 2021-05-28 ENCOUNTER — Ambulatory Visit (INDEPENDENT_AMBULATORY_CARE_PROVIDER_SITE_OTHER): Payer: Managed Care, Other (non HMO) | Admitting: Bariatrics

## 2021-05-28 ENCOUNTER — Other Ambulatory Visit: Payer: Self-pay

## 2021-05-28 ENCOUNTER — Encounter (INDEPENDENT_AMBULATORY_CARE_PROVIDER_SITE_OTHER): Payer: Self-pay | Admitting: Bariatrics

## 2021-05-28 VITALS — BP 135/89 | HR 78 | Temp 98.0°F | Ht 65.0 in | Wt 185.0 lb

## 2021-05-28 DIAGNOSIS — E559 Vitamin D deficiency, unspecified: Secondary | ICD-10-CM | POA: Diagnosis not present

## 2021-05-28 DIAGNOSIS — Z6831 Body mass index (BMI) 31.0-31.9, adult: Secondary | ICD-10-CM | POA: Diagnosis not present

## 2021-05-28 DIAGNOSIS — E669 Obesity, unspecified: Secondary | ICD-10-CM | POA: Diagnosis not present

## 2021-05-28 DIAGNOSIS — I10 Essential (primary) hypertension: Secondary | ICD-10-CM | POA: Diagnosis not present

## 2021-05-29 NOTE — Progress Notes (Signed)
Chief Complaint:   OBESITY Rachael Russell is here to discuss her progress with her obesity treatment plan along with follow-up of her obesity related diagnoses. Rachael Russell is on the Category 2 Plan and states she is following her eating plan approximately 40% of the time. [name] states she is using the elliptical and weight training for 60 minutes 2-3 times per week.  Today's visit was #: 12 Starting weight: 192 lbs Starting date: 10/22/2020 Today's weight: 185 lbs Today's date: 05/28/2021 Total lbs lost to date: 7 lbs Total lbs lost since last in-office visit: 0  Interim History: [name]'s weight remain the same.  Subjective:   1. Vitamin D deficiency Rachael Russell is taking Vitamin D currently.  2. Essential hypertension Rachael Russell is taking Norvasc, HCTZ and Zestril.  Assessment/Plan:   1. Vitamin D deficiency Low Vitamin D level contributes to fatigue and are associated with obesity, breast, and colon cancer.Rachael Russell agrees to continue to take prescription Vitamin D 2,000 IU daily and she will follow-up for routine testing of Vitamin D, at least 2-3 times per year to avoid over-replacement.  2. Essential hypertension Rachael Russell will continue medications. She is working on healthy weight loss and exercise to improve blood pressure control. We will watch for signs of hypotension as she continues her lifestyle modifications.  3. Obesity with current BMI of 30.8 Rachael Russell is currently in the action stage of change. As such, her goal is to continue with weight loss efforts. She has agreed to the Category 2 Plan and keeping a food journal and adhering to recommended goals of 1200 calories and 80-90 grams of protein.   Rachael Russell will continue meal planning and intentional eating.  Exercise goals: Rachael Russell will do more exercise. She will continue elliptical and weights.   Behavioral modification strategies: increasing lean protein intake, decreasing simple carbohydrates, increasing vegetables, increasing water  intake, decreasing eating out, no skipping meals, meal planning and cooking strategies, keeping healthy foods in the home, and planning for success.  Rachael Russell has agreed to follow-up with our clinic in 3 weeks with Dr. Raliegh Scarlet. She was informed of the importance of frequent follow-up visits to maximize her success with intensive lifestyle modifications for her multiple health conditions.   Objective:   Blood pressure 135/89, pulse 78, temperature 98 F (36.7 C), height 5\' 5"  (1.651 m), weight 185 lb (83.9 kg), last menstrual period 04/25/2019, SpO2 100 %. Body mass index is 30.79 kg/m.  General: Cooperative, alert, well developed, in no acute distress. HEENT: Conjunctivae and lids unremarkable. Cardiovascular: Regular rhythm.  Lungs: Normal work of breathing. Neurologic: No focal deficits.   Lab Results  Component Value Date   CREATININE 0.97 04/04/2021   BUN 22 04/04/2021   NA 141 04/04/2021   K 4.1 04/04/2021   CL 101 04/04/2021   CO2 25 04/04/2021   Lab Results  Component Value Date   ALT 25 04/04/2021   AST 24 04/04/2021   ALKPHOS 97 04/04/2021   BILITOT 0.3 04/04/2021   Lab Results  Component Value Date   HGBA1C 5.8 (H) 04/04/2021   HGBA1C 5.8 (H) 10/22/2020   Lab Results  Component Value Date   INSULIN 11.3 10/22/2020   Lab Results  Component Value Date   TSH 2.410 04/04/2021   Lab Results  Component Value Date   CHOL 209 (H) 04/04/2021   HDL 68 04/04/2021   LDLCALC 129 (H) 04/04/2021   LDLDIRECT 149.9 05/11/2011   TRIG 65 04/04/2021   CHOLHDL 3.1 04/04/2021   Lab Results  Component Value Date   VD25OH 53.7 04/04/2021   VD25OH 46.4 10/22/2020   VD25OH 47.5 04/10/2020   Lab Results  Component Value Date   WBC 7.8 04/04/2021   HGB 13.5 04/04/2021   HCT 40.6 04/04/2021   MCV 90 04/04/2021   PLT 293 04/04/2021   Lab Results  Component Value Date   IRON 84 04/04/2021   FERRITIN 88 04/28/2017   Attestation Statements:   Reviewed by clinician  on day of visit: allergies, medications, problem list, medical history, surgical history, family history, social history, and previous encounter notes.  I, Lizbeth Bark, RMA, am acting as Location manager for [employer], DO.   I have reviewed the above documentation for accuracy and completeness, and I agree with the above. Jearld Lesch, DO

## 2021-06-02 ENCOUNTER — Encounter (INDEPENDENT_AMBULATORY_CARE_PROVIDER_SITE_OTHER): Payer: Self-pay | Admitting: Bariatrics

## 2021-06-05 ENCOUNTER — Telehealth: Payer: Self-pay | Admitting: Family Medicine

## 2021-06-05 MED ORDER — LISINOPRIL 40 MG PO TABS: 40.0000 mg | ORAL_TABLET | Freq: Every day | ORAL | 2 refills | Status: DC

## 2021-06-05 NOTE — Telephone Encounter (Signed)
  Encourage patient to contact the pharmacy for refills or they can request refills through [place]:  Please schedule appointment if longer than 1 year  NEXT APPOINTMENT DATE:04/06/2022  MEDICATION:lisinopril  Is the patient out of medication? yes  PHARMACY: [facility] on [address] in [place]  Let patient know to contact pharmacy at the end of the day to make sure medication is ready.  Please notify patient to allow 48-72 hours to process  CLINICAL FILLS OUT ALL BELOW:   LAST REFILL:  QTY:  REFILL DATE:    OTHER COMMENTS:    Okay for refill?  Please advise

## 2021-06-11 ENCOUNTER — Ambulatory Visit (INDEPENDENT_AMBULATORY_CARE_PROVIDER_SITE_OTHER): Payer: Managed Care, Other (non HMO) | Admitting: Family Medicine

## 2021-06-12 ENCOUNTER — Ambulatory Visit (INDEPENDENT_AMBULATORY_CARE_PROVIDER_SITE_OTHER): Payer: Managed Care, Other (non HMO) | Admitting: Family Medicine

## 2021-06-16 ENCOUNTER — Encounter (INDEPENDENT_AMBULATORY_CARE_PROVIDER_SITE_OTHER): Payer: Self-pay

## 2021-06-17 ENCOUNTER — Ambulatory Visit (INDEPENDENT_AMBULATORY_CARE_PROVIDER_SITE_OTHER): Payer: Managed Care, Other (non HMO) | Admitting: Family Medicine

## 2021-06-24 ENCOUNTER — Ambulatory Visit (INDEPENDENT_AMBULATORY_CARE_PROVIDER_SITE_OTHER): Payer: Managed Care, Other (non HMO) | Admitting: Family Medicine

## 2021-06-24 ENCOUNTER — Encounter (INDEPENDENT_AMBULATORY_CARE_PROVIDER_SITE_OTHER): Payer: Self-pay | Admitting: Family Medicine

## 2021-06-24 ENCOUNTER — Other Ambulatory Visit: Payer: Self-pay

## 2021-06-24 VITALS — BP 136/88 | HR 73 | Temp 98.0°F | Ht 65.0 in | Wt 185.0 lb

## 2021-06-24 DIAGNOSIS — E669 Obesity, unspecified: Secondary | ICD-10-CM

## 2021-06-24 DIAGNOSIS — I10 Essential (primary) hypertension: Secondary | ICD-10-CM

## 2021-06-24 DIAGNOSIS — Z6831 Body mass index (BMI) 31.0-31.9, adult: Secondary | ICD-10-CM | POA: Diagnosis not present

## 2021-06-24 DIAGNOSIS — Z9189 Other specified personal risk factors, not elsewhere classified: Secondary | ICD-10-CM | POA: Diagnosis not present

## 2021-06-24 NOTE — Progress Notes (Signed)
Chief Complaint:   OBESITY Rachael Russell is here to discuss her progress with her obesity treatment plan along with follow-up of her obesity related diagnoses. Rachael Russell is on the Category 2 Plan and keeping a food journal and adhering to recommended goals of 1200 calories and 80-90 grams protein and states she is following her eating plan approximately 10% of the time. Rachael Russell states she is walking 45 minutes 1-2 times per week.  Today's visit was #: 41 Starting weight: 192 lbs Starting date: 10/22/2020 Today's weight: 185 lbs Today's date: 06/24/2021 Total lbs lost to date: 7 Total lbs lost since last in-office visit: 0  Interim History: Rachael Russell reports increased stress at work. She has no time for herself and has been eating less healthy lately. She is eating out more than usual. Pt is happy she didn't gain as are we.  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  Subjective:   1. Essential hypertension High normal range today. Rachael Russell hasn't been checking her BP at home. She has no concerns. Medication: Norvasc, Zestril, HCTZ  2. At risk for depression [place] is at risk for depression due to all the stressors in life lately.  Assessment/Plan:   1. Essential hypertension Decrease salt intake and increase water. Get back to exercising and continue medications. Check BP at home.  2. At risk for depression Rachael Russell was given approximately 9 minutes of depression risk counseling today. She has risk factors for depression including life stressors. We discussed the importance of a healthy work life balance, a healthy relationship with food and a good support system.  Repetitive spaced learning was employed today to elicit superior memory formation and behavioral change.  3. BMI today 30  Rachael Russell is currently in the action stage of change. As such, her goal is to continue with weight loss efforts. She has agreed to the Category 2  Plan and keeping a food journal and adhering to recommended goals of 1200 calories and 80-90 grams protein.   Pt wishes to take a couple of months off of the plan and rejoin [place] in January.  Risks associated with this was discussed with pt.  Understands 6 mo rule.  All questions answered.  Exercise goals: For substantial health benefits, adults should do at least 150 minutes (2 hours and 30 minutes) a week of moderate-intensity, or 75 minutes (1 hour and 15 minutes) a week of vigorous-intensity aerobic physical activity, or an equivalent combination of moderate- and vigorous-intensity aerobic activity. Aerobic activity should be performed in episodes of at least 10 minutes, and preferably, it should be spread throughout the week.  Behavioral modification strategies: meal planning and cooking strategies and avoiding temptations.  Rachael Russell has agreed to follow-up with our clinic in early January, per pt request. She was informed of the importance of frequent follow-up visits to maximize her success with intensive lifestyle modifications for her multiple health conditions.   Objective:   Blood pressure 136/88, pulse 73, temperature 98 F (36.7 C), height 5\' 5"  (1.651 m), weight 185 lb (83.9 kg), last menstrual period 04/25/2019, SpO2 100 %. Body mass index is 30.79 kg/m.  General: Cooperative, alert, well developed, in no acute distress. HEENT: Conjunctivae and lids unremarkable. Cardiovascular: Regular rhythm.  Lungs: Normal work of breathing. Neurologic: No focal deficits.   Lab Results  Component Value Date   CREATININE 0.97 04/04/2021   BUN 22 04/04/2021   NA 141 04/04/2021  K 4.1 04/04/2021   CL 101 04/04/2021   CO2 25 04/04/2021   Lab Results  Component Value Date   ALT 25 04/04/2021   AST 24 04/04/2021   ALKPHOS 97 04/04/2021   BILITOT 0.3 04/04/2021   Lab Results  Component Value Date   HGBA1C 5.8 (H) 04/04/2021   HGBA1C 5.8 (H) 10/22/2020   Lab Results  Component  Value Date   INSULIN 11.3 10/22/2020   Lab Results  Component Value Date   TSH 2.410 04/04/2021   Lab Results  Component Value Date   CHOL 209 (H) 04/04/2021   HDL 68 04/04/2021   LDLCALC 129 (H) 04/04/2021   LDLDIRECT 149.9 05/11/2011   TRIG 65 04/04/2021   CHOLHDL 3.1 04/04/2021   Lab Results  Component Value Date   VD25OH 53.7 04/04/2021   VD25OH 46.4 10/22/2020   VD25OH 47.5 04/10/2020   Lab Results  Component Value Date   WBC 7.8 04/04/2021   HGB 13.5 04/04/2021   HCT 40.6 04/04/2021   MCV 90 04/04/2021   PLT 293 04/04/2021   Lab Results  Component Value Date   IRON 84 04/04/2021   FERRITIN 88 04/28/2017    Attestation Statements:   Reviewed by clinician on day of visit: allergies, medications, problem list, medical history, surgical history, family history, social history, and previous encounter notes.  Coral Ceo, CMA, am acting as transcriptionist for [employer], DO.  I have reviewed the above documentation for accuracy and completeness, and I agree with the above. Marjory Sneddon, D.O.  The [place] was signed into law in 2016 which includes the topic of electronic health records.  This provides immediate access to information in MyChart.  This includes consultation notes, operative notes, office notes, lab results and pathology reports.  If you have any questions about what you read please let us know at your next visit so we can discuss your concerns and take corrective action if need be.  We are right here with you.

## 2021-07-02 ENCOUNTER — Ambulatory Visit: Payer: Managed Care, Other (non HMO) | Admitting: Family Medicine

## 2021-07-09 ENCOUNTER — Ambulatory Visit (INDEPENDENT_AMBULATORY_CARE_PROVIDER_SITE_OTHER): Payer: Managed Care, Other (non HMO) | Admitting: Family Medicine

## 2021-07-09 ENCOUNTER — Telehealth: Payer: Self-pay | Admitting: Urology

## 2021-07-09 NOTE — Telephone Encounter (Signed)
DOS - 07/29/21  [place] BUNIONECTOMY RIGHT --- [id number]   [employer] EFFECTIVE DATE - 11/23/2015   PER [employer]'S AUTOMATIVE SYSTEM FOR CPT CODE 79810 NO PRIOR AUTH IS REQUIRED.  REF # [id number]

## 2021-07-28 MED ORDER — OXYCODONE-ACETAMINOPHEN 10-325 MG PO TABS: 1.0000 | ORAL_TABLET | ORAL | 0 refills | Status: DC | PRN

## 2021-07-28 MED ORDER — ONDANSETRON HCL 4 MG PO TABS: 4.0000 mg | ORAL_TABLET | Freq: Three times a day (TID) | ORAL | 0 refills | Status: DC | PRN

## 2021-07-28 NOTE — Addendum Note (Signed)
Addended by: Wallene Huh on: 07/28/2021 01:10 PM   Modules accepted: Orders

## 2021-07-28 NOTE — Addendum Note (Signed)
Addended by: Wallene Huh on: 07/28/2021 04:54 PM   Modules accepted: Orders

## 2021-07-29 ENCOUNTER — Encounter: Payer: Self-pay | Admitting: Podiatry

## 2021-07-29 DIAGNOSIS — M2011 Hallux valgus (acquired), right foot: Secondary | ICD-10-CM

## 2021-07-29 DIAGNOSIS — M25774 Osteophyte, right foot: Secondary | ICD-10-CM

## 2021-07-31 ENCOUNTER — Telehealth: Payer: Self-pay | Admitting: *Deleted

## 2021-07-31 ENCOUNTER — Other Ambulatory Visit: Payer: Self-pay | Admitting: Podiatry

## 2021-07-31 MED ORDER — HYDROMORPHONE HCL 4 MG PO TABS: 4.0000 mg | ORAL_TABLET | ORAL | 0 refills | Status: DC | PRN

## 2021-07-31 NOTE — Telephone Encounter (Signed)
Patient is calling because the pain medicine (oxycodone-ace, 10-325 mg)prescribed is causing itching,tried taking tylenol but causes major headaches. Please advise.

## 2021-07-31 NOTE — Telephone Encounter (Signed)
I called in dilaudid

## 2021-07-31 NOTE — Telephone Encounter (Signed)
Patient notified

## 2021-08-04 ENCOUNTER — Encounter: Payer: Self-pay | Admitting: Podiatry

## 2021-08-04 ENCOUNTER — Ambulatory Visit (INDEPENDENT_AMBULATORY_CARE_PROVIDER_SITE_OTHER): Payer: Managed Care, Other (non HMO)

## 2021-08-04 ENCOUNTER — Other Ambulatory Visit: Payer: Self-pay

## 2021-08-04 ENCOUNTER — Ambulatory Visit (INDEPENDENT_AMBULATORY_CARE_PROVIDER_SITE_OTHER): Payer: Managed Care, Other (non HMO) | Admitting: Podiatry

## 2021-08-04 DIAGNOSIS — Z9889 Other specified postprocedural states: Secondary | ICD-10-CM

## 2021-08-04 NOTE — Progress Notes (Signed)
Subjective:   Patient ID: Rachael Russell, female   DOB: 56 y.o.   MRN: [id number]   HPI Patient presents stating I am doing well with surgery I am using crutches but I am bearing some weight down on the foot   ROS      Objective:  Physical Exam  Neurovascular status intact negative Bevelyn Buckles' sign noted right foot healing well wound edges well coapted hallux in rectus position good range of motion and incision intact     Assessment:  Doing well post osteotomy right first metatarsal with good alignment noted     Plan:  H&P reviewed condition continue with conservative care reapplied sterile dressing continue immobilization dispensed surgical shoe gradually increase walking and bending to the big toe joint.  Reappoint in 2 to 3 weeks earlier if needed  X-rays indicate osteotomies healing well fixation in place alignment good

## 2021-08-08 ENCOUNTER — Telehealth: Payer: Self-pay | Admitting: *Deleted

## 2021-08-08 NOTE — Telephone Encounter (Signed)
Patient is calling because she is still having quite a bit of swelling back of foot and toes especially with putting foot down on floor thru out the day, has been elevating and staying off of them as much as possible but is concerned , wants to make sure that it is healing properly. Please advise

## 2021-08-08 NOTE — Telephone Encounter (Signed)
Normal to still have swelling. Should start to reduce

## 2021-08-12 ENCOUNTER — Encounter: Payer: Self-pay | Admitting: Podiatry

## 2021-08-12 LAB — HM MAMMOGRAPHY

## 2021-08-12 NOTE — Telephone Encounter (Signed)
Patient called again this morning her foot is still swollen and she wants to know if she can start walking on it, should she limit activity ?  Should she be taking something other than Advil for inflammation ? Please advise

## 2021-08-12 NOTE — Telephone Encounter (Signed)
Please advise. Dr. Paulla Dolly is on vacation.

## 2021-08-13 NOTE — Telephone Encounter (Signed)
Advil is fine and continue elevation and ace wrap for periods of time

## 2021-08-13 NOTE — Telephone Encounter (Signed)
Spoke with patient this morning she agreed to instructions , if she has any other issues she will give a call back .

## 2021-08-14 ENCOUNTER — Other Ambulatory Visit: Payer: Self-pay | Admitting: Podiatry

## 2021-08-14 MED ORDER — DOXYCYCLINE HYCLATE 100 MG PO TABS: 100.0000 mg | ORAL_TABLET | Freq: Two times a day (BID) | ORAL | 0 refills | Status: DC

## 2021-08-14 NOTE — Progress Notes (Signed)
PRN postop 

## 2021-08-21 ENCOUNTER — Ambulatory Visit (INDEPENDENT_AMBULATORY_CARE_PROVIDER_SITE_OTHER): Payer: Managed Care, Other (non HMO) | Admitting: Podiatry

## 2021-08-21 ENCOUNTER — Ambulatory Visit (INDEPENDENT_AMBULATORY_CARE_PROVIDER_SITE_OTHER): Payer: Managed Care, Other (non HMO)

## 2021-08-21 ENCOUNTER — Encounter: Payer: Self-pay | Admitting: Podiatry

## 2021-08-21 ENCOUNTER — Other Ambulatory Visit: Payer: Self-pay

## 2021-08-21 DIAGNOSIS — Z9889 Other specified postprocedural states: Secondary | ICD-10-CM | POA: Diagnosis not present

## 2021-08-21 NOTE — Progress Notes (Signed)
Subjective:   Patient ID: Rachael Russell, female   DOB: 55 y.o.   MRN: [id number]   HPI Patient presents stating she is doing very well with her foot stating she gets some numbness but other than that is very happy with progression   ROS      Objective:  Physical Exam  Ocular status intact negative Bevelyn Buckles' sign noted wound edges healed well right hallux in rectus position first MPJ healing well with good range of motion     Assessment:  Doing well post osteotomy right first metatarsal     Plan:  H&P reviewed condition discussed continuation of range of motion exercises gradual return to shoes and dispensed ankle compression stocking.  Reappoint 6 weeks final visit earlier if needed  X-rays indicate osteotomies healing well fixation in place joint congruence

## 2021-08-28 ENCOUNTER — Encounter: Payer: Self-pay | Admitting: Family Medicine

## 2021-09-03 ENCOUNTER — Telehealth (INDEPENDENT_AMBULATORY_CARE_PROVIDER_SITE_OTHER): Payer: Managed Care, Other (non HMO) | Admitting: Family Medicine

## 2021-09-03 ENCOUNTER — Other Ambulatory Visit: Payer: Self-pay

## 2021-09-03 ENCOUNTER — Encounter (INDEPENDENT_AMBULATORY_CARE_PROVIDER_SITE_OTHER): Payer: Self-pay | Admitting: Family Medicine

## 2021-09-03 DIAGNOSIS — Z6830 Body mass index (BMI) 30.0-30.9, adult: Secondary | ICD-10-CM | POA: Diagnosis not present

## 2021-09-03 DIAGNOSIS — R7303 Prediabetes: Secondary | ICD-10-CM | POA: Diagnosis not present

## 2021-09-03 DIAGNOSIS — Z9189 Other specified personal risk factors, not elsewhere classified: Secondary | ICD-10-CM

## 2021-09-03 DIAGNOSIS — E669 Obesity, unspecified: Secondary | ICD-10-CM

## 2021-09-04 NOTE — Progress Notes (Signed)
TeleHealth Visit:  Due to the COVID-19 pandemic, this visit was completed with telemedicine (audio/video) technology to reduce patient and provider exposure as well as to preserve personal protective equipment.   Rachael Russell has verbally consented to this TeleHealth visit. The patient is located at home, the provider is located at the [facility] office. The participants in this visit include the listed provider and patient. The visit was conducted today via MyChart video.   Chief Complaint: OBESITY Rachael Russell is here to discuss her progress with her obesity treatment plan along with follow-up of her obesity related diagnoses. Rachael Russell is on the Category 2 Plan or keeping a food journal and adhering to recommended goals of 1200 calories and 80-90 grams of protein daily and states she is following her eating plan approximately 50% of the time. Rachael Russell states she is not currently exercising.  Today's visit was #: 14 Starting weight: 192 lbs Starting date: 10/22/2020  Interim History: Rachael Russell had foot surgery on December 6 th, and she is unable to walk or be real active. She tried to drive here but her foot pain was too much to bare. She had a live video visit today.  Subjective:   1. Pre-diabetes Rachael Russell's last A1c was approximately 5 months ago, and was at 5.8. She is not on medications, and she doesn't have complaints today of excessive cravings or hunger.  2. At risk for activity intolerance Rachael Russell is at risk for exercise intolerance due to foot surgery.  Assessment/Plan:   Orders Placed This Encounter  Procedures   Hemoglobin A1c    Medications Discontinued During This Encounter  Medication Reason   oxyCODONE-acetaminophen (PERCOCET) 10-325 MG tablet    oxyCODONE-acetaminophen (PERCOCET) 10-325 MG tablet    HYDROmorphone (DILAUDID) 4 MG tablet    ondansetron (ZOFRAN) 4 MG tablet    doxycycline (VIBRA-TABS) 100 MG tablet      No orders of the defined types were placed in  this encounter.    1. Pre-diabetes We will recheck A1c at her next office visit. Rachael Russell will continue her prudent nutritional plan, weight loss, and decreasing simple carbohydrates for snacks.  - Hemoglobin A1c  2. At risk for activity intolerance Rachael Russell was given approximately 9 minutes of exercise intolerance counseling today. She is 56 y.o. female and has risk factors exercise intolerance including obesity. We discussed intensive lifestyle modifications today with an emphasis on specific weight loss instructions and strategies. Rachael Russell will slowly increase activity as tolerated.  Repetitive spaced learning was employed today to elicit superior memory formation and behavioral change.  3. Obesity with current BMI of 30.79 Rachael Russell is currently in the action stage of change. As such, her goal is to continue with weight loss efforts. She has agreed to the Category 2 Plan.   Exercise goals: As tolerated per surgeon.  Behavioral modification strategies: increasing lean protein intake, decreasing simple carbohydrates, and planning for success.  Rachael Russell has agreed to follow-up with our clinic in 3 to 4 weeks. She was informed of the importance of frequent follow-up visits to maximize her success with intensive lifestyle modifications for her multiple health conditions.  Objective:   VITALS: Per patient if applicable, see vitals. GENERAL: Alert and in no acute distress. CARDIOPULMONARY: No increased WOB. Speaking in clear sentences.  PSYCH: Pleasant and cooperative. Speech normal rate and rhythm. Affect is appropriate. Insight and judgement are appropriate. Attention is focused, linear, and appropriate.  NEURO: Oriented as arrived to appointment on time with no prompting.   Lab  Results  Component Value Date   CREATININE 0.97 04/04/2021   BUN 22 04/04/2021   NA 141 04/04/2021   K 4.1 04/04/2021   CL 101 04/04/2021   CO2 25 04/04/2021   Lab Results  Component Value Date   ALT 25  04/04/2021   AST 24 04/04/2021   ALKPHOS 97 04/04/2021   BILITOT 0.3 04/04/2021   Lab Results  Component Value Date   HGBA1C 5.8 (H) 04/04/2021   HGBA1C 5.8 (H) 10/22/2020   Lab Results  Component Value Date   INSULIN 11.3 10/22/2020   Lab Results  Component Value Date   TSH 2.410 04/04/2021   Lab Results  Component Value Date   CHOL 209 (H) 04/04/2021   HDL 68 04/04/2021   LDLCALC 129 (H) 04/04/2021   LDLDIRECT 149.9 05/11/2011   TRIG 65 04/04/2021   CHOLHDL 3.1 04/04/2021   Lab Results  Component Value Date   VD25OH 53.7 04/04/2021   VD25OH 46.4 10/22/2020   VD25OH 47.5 04/10/2020   Lab Results  Component Value Date   WBC 7.8 04/04/2021   HGB 13.5 04/04/2021   HCT 40.6 04/04/2021   MCV 90 04/04/2021   PLT 293 04/04/2021   Lab Results  Component Value Date   IRON 84 04/04/2021   FERRITIN 88 04/28/2017    Attestation Statements:   Reviewed by clinician on day of visit: allergies, medications, problem list, medical history, surgical history, family history, social history, and previous encounter notes.   Wilhemena Durie, am acting as transcriptionist for [employer], DO.  I have reviewed the above documentation for accuracy and completeness, and I agree with the above. Marjory Sneddon, D.O.  The [place] was signed into law in 2016 which includes the topic of electronic health records.  This provides immediate access to information in MyChart.  This includes consultation notes, operative notes, office notes, lab results and pathology reports.  If you have any questions about what you read please let us know at your next visit so we can discuss your concerns and take corrective action if need be.  We are right here with you.

## 2021-09-27 ENCOUNTER — Encounter: Payer: Self-pay | Admitting: Radiology

## 2021-09-30 ENCOUNTER — Other Ambulatory Visit: Payer: Self-pay | Admitting: Family Medicine

## 2021-09-30 DIAGNOSIS — K219 Gastro-esophageal reflux disease without esophagitis: Secondary | ICD-10-CM

## 2021-10-09 ENCOUNTER — Ambulatory Visit (INDEPENDENT_AMBULATORY_CARE_PROVIDER_SITE_OTHER): Payer: Managed Care, Other (non HMO) | Admitting: Podiatry

## 2021-10-09 ENCOUNTER — Ambulatory Visit (INDEPENDENT_AMBULATORY_CARE_PROVIDER_SITE_OTHER): Payer: Managed Care, Other (non HMO)

## 2021-10-09 ENCOUNTER — Encounter: Payer: Self-pay | Admitting: Podiatry

## 2021-10-09 ENCOUNTER — Other Ambulatory Visit: Payer: Self-pay

## 2021-10-09 DIAGNOSIS — Z9889 Other specified postprocedural states: Secondary | ICD-10-CM

## 2021-10-10 NOTE — Progress Notes (Signed)
Subjective:   Patient ID: Rachael Russell, female   DOB: 56 y.o.   MRN: [id number]   HPI Patient states overall she is doing very well but she did note some prominence on the inside of the right big toe and continues to work on range of motion   ROS      Objective:  Physical Exam  Neurovascular status intact negative Bevelyn Buckles' sign noted wound edges are well coapted mild swelling still noted consistent with this.  Postop range of motion adequate with no crepitus of the joint and no current bone prominence on the inside of the right big toe more due to some slight swelling     Assessment:  Doing well post osteotomy right first metatarsal with good alignment noted mild swelling consistent with this.  Postop     Plan:  H&P x-rays reviewed and advised on the importance of walking on the whole foot for range of motion and the aching is normal at this.  Postop.  This should resolve over time but probably will take another several months to get completely better and patient appraised of this and will be seen back as needed  X-rays indicate osteotomies healing well fixation in place joint congruence

## 2021-12-31 ENCOUNTER — Encounter: Payer: Self-pay | Admitting: Family Medicine

## 2021-12-31 ENCOUNTER — Ambulatory Visit (INDEPENDENT_AMBULATORY_CARE_PROVIDER_SITE_OTHER): Payer: Managed Care, Other (non HMO) | Admitting: Family Medicine

## 2021-12-31 VITALS — BP 127/80 | HR 71 | Ht 65.0 in | Wt 187.0 lb

## 2021-12-31 DIAGNOSIS — Z780 Asymptomatic menopausal state: Secondary | ICD-10-CM | POA: Diagnosis not present

## 2021-12-31 DIAGNOSIS — Z01419 Encounter for gynecological examination (general) (routine) without abnormal findings: Secondary | ICD-10-CM

## 2021-12-31 NOTE — Progress Notes (Signed)
Patient presents for Annual Exam. ? ?*[facility]* ? ?Last Mammogram:2022  ?Last pap: 11/14/2020 ?Family Hx of Breast Cancer: Yes ?STD Screening: Declines  ? ?CC: None  ? ? ?

## 2021-12-31 NOTE — Patient Instructions (Signed)
[place] ?

## 2021-12-31 NOTE — Assessment & Plan Note (Signed)
Does not want HRT due to [place] breast Ca. Declines SSRI or other meds--advised to try [employer].  ?

## 2021-12-31 NOTE — Progress Notes (Signed)
Subjective:  ?  ? Rachael Russell is a 56 y.o. female and is here for a comprehensive physical exam. The patient reports problems - hot flashes . And night sweats. Symptomatic about 3 years. Has tried OTC meds, with no alleviation. ? ? ?The following portions of the patient's history were reviewed and updated as appropriate: allergies, current medications, past family history, past medical history, past social history, past surgical history, and problem list. ? ?Review of Systems ?Pertinent items noted in HPI and remainder of comprehensive ROS otherwise negative.  ? ?Objective:  ? ? BP 127/80   Pulse 71   Ht '5\' 5"'$  (1.651 m)   Wt 187 lb (84.8 kg)   LMP 04/25/2019   BMI 31.12 kg/m?  ?General appearance: alert, cooperative, and appears stated age ?Head: Normocephalic, without obvious abnormality, atraumatic ?Neck: no adenopathy, supple, symmetrical, trachea midline, and thyroid not enlarged, symmetric, no tenderness/mass/nodules ?Lungs: clear to auscultation bilaterally ?Breasts: normal appearance, no masses or tenderness ?Heart: regular rate and rhythm, S1, S2 normal, no murmur, click, rub or gallop ?Abdomen: soft, non-tender; bowel sounds normal; no masses,  no organomegaly ?Extremities: extremities normal, atraumatic, no cyanosis or edema ?Pulses: 2+ and symmetric ?Skin: Skin color, texture, turgor normal. No rashes or lesions ?Lymph nodes: Cervical, supraclavicular, and axillary nodes normal. ?Neurologic: Grossly normal  ?  ?Assessment:  ? ? Healthy female exam.    ?  ?Plan:  ? ?Problem List Items Addressed This Visit   ? ?  ? Unprioritized  ? Menopause  ?  Does not want HRT due to [place] breast Ca. Declines SSRI or other meds--advised to try [employer].  ? ?  ?  ? ?Other Visit Diagnoses   ? ? Encounter for gynecological examination without abnormal finding    -  Primary  ? Pap smear is up to date.  ? Relevant Orders  ? MM 3D SCREEN BREAST BILATERAL  ? ?  ? ?Return in 1 year (on 01/01/2023). ? ?  ?See After Visit  Summary for Counseling Recommendations  ? ?

## 2022-01-21 ENCOUNTER — Ambulatory Visit: Payer: Managed Care, Other (non HMO) | Admitting: Internal Medicine

## 2022-01-21 ENCOUNTER — Encounter: Payer: Self-pay | Admitting: Internal Medicine

## 2022-01-21 VITALS — BP 132/86 | HR 75 | Temp 98.0°F | Resp 16 | Ht 64.75 in | Wt 183.7 lb

## 2022-01-21 DIAGNOSIS — R0683 Snoring: Secondary | ICD-10-CM

## 2022-01-21 DIAGNOSIS — K219 Gastro-esophageal reflux disease without esophagitis: Secondary | ICD-10-CM | POA: Diagnosis not present

## 2022-01-21 DIAGNOSIS — I10 Essential (primary) hypertension: Secondary | ICD-10-CM

## 2022-01-21 DIAGNOSIS — L309 Dermatitis, unspecified: Secondary | ICD-10-CM

## 2022-01-21 DIAGNOSIS — Z114 Encounter for screening for human immunodeficiency virus [HIV]: Secondary | ICD-10-CM

## 2022-01-21 DIAGNOSIS — R600 Localized edema: Secondary | ICD-10-CM | POA: Diagnosis not present

## 2022-01-21 DIAGNOSIS — E559 Vitamin D deficiency, unspecified: Secondary | ICD-10-CM

## 2022-01-21 DIAGNOSIS — E7800 Pure hypercholesterolemia, unspecified: Secondary | ICD-10-CM

## 2022-01-21 MED ORDER — TRIAMCINOLONE ACETONIDE 0.1 % EX CREA: 1.0000 "application " | TOPICAL_CREAM | Freq: Two times a day (BID) | CUTANEOUS | 0 refills | Status: DC

## 2022-01-21 NOTE — Patient Instructions (Addendum)
It was great seeing you today!  Plan discussed at today's visit: -Blood work ordered today, results will be uploaded to [facility].  -Stop Amlodipine to see how swelling does but keep a close eye on your blood pressure. If it starts consistently being over 140/90 please let me know -Make sure to keep legs elevated in the evenings and I do recommend compression stockings to help  -Referral to Pulmonology and sleep medicine for sleep study   Follow up in: 1 month   Take care and let us know if you have any questions or concerns prior to your next visit.  Dr. Rosana Berger

## 2022-01-21 NOTE — Progress Notes (Signed)
New Patient Office Visit  Subjective    Patient ID: Rachael Russell, female    DOB: 31-May-1966  Age: 56 y.o. MRN: [id number]  CC:  Chief Complaint  Patient presents with   Establish Care   Edema    Bilateral legs worse at end of day does not think HCTZ is working   Snoring    Bad wants to be tested for sleep apnea    HPI Rachael Russell presents to establish care.   Complaining of increased snoring today. Had been snoring for some time but states that her husband noticed it is getting more frequent. She will occasionally wake herself up because of the snoring. She has woken up in the middle of the night gasping for air before, this is becoming more frequent as well. She occasionally will wake up in the morning not feel refreshed and will wake up with morning headaches. Does not have to take naps in the day.   Hypertension: -Medications: Amlodipine 5, HCTZ 25, Lisinopril 40. Also on KCl 99 mg. Had been on Torsemide in the past for swelling but this was stopped and she was started on HCTZ instead - feels like BLE swelling is getting worse, worse in the evenings. Does try to elevate her legs, does not wear compression stockings.  -Patient is compliant with above medications and reports no side effects. -Checking BP at home (average): 120/70 earlier this week, 141/90 last week but usually not this high  -Denies any headache, SOB, CP, vision changes or symptoms of hypotension -Diet: actively trying to lose weight - yogurt and granola and coffee, lunch with meat and salad and dinner meat and vegetables  -Exercise: work out groups Monday and Saturdays - working with a Physiological scientist at [employer] - doing cardio and weights   HLD: -Medications: Lipitor 10  -Patient is compliant with above medications and reports no side effects.  -Last lipid panel: Lipid Panel     Component Value Date/Time   CHOL 209 (H) 04/04/2021 1123   TRIG 65 04/04/2021 1123   HDL 68 04/04/2021 1123   CHOLHDL 3.1  04/04/2021 1123   CHOLHDL 4 05/11/2011 1230   VLDL 11.0 05/11/2011 1230   LDLCALC 129 (H) 04/04/2021 1123   LDLDIRECT 149.9 05/11/2011 1230   LABVLDL 12 04/04/2021 1123   The 10-year ASCVD risk score (Arnett DK, et al., 2019) is: 5%   Values used to calculate the score:     Age: 61 years     Sex: Female     Is Non-Hispanic African American: Yes     Diabetic: No     Tobacco smoker: No     Systolic Blood Pressure: 440 mmHg     Is BP treated: Yes     HDL Cholesterol: 68 mg/dL     Total Cholesterol: 209 mg/dL  GERD: -Currently on Pepcid 20 mg  -Controlling symptoms. Denies abdominal pain, nausea, vomiting - symptoms present as chest pains.   Vitamin D Deficiency: -Currently on 2000 IU daily  -Last Vitamin D 53.7 8/22  Health Maintenance: -Blood work due -Mammogram - has had multiple abnormal mammograms with 3 lumpectomies, last one in 2018. Last mammogram 12/22. Has an extensive family history of breast cancer, is BRCA negative.  -Colon cancer screening: 09/24/2016, repeat in 10 years    Outpatient Encounter Medications as of 01/21/2022  Medication Sig   amLODipine (NORVASC) 5 MG tablet TAKE 1 TABLET BY MOUTH  DAILY   atorvastatin (LIPITOR) 10 MG tablet  TAKE 1 TABLET BY MOUTH  DAILY   Cholecalciferol 2000 units TABS Take 1 tablet by mouth daily.   cyclobenzaprine (FLEXERIL) 10 MG tablet Take 10 mg by mouth 3 (three) times daily as needed for muscle spasms.   famotidine (PEPCID) 20 MG tablet TAKE 1 TABLET(20 MG) BY MOUTH TWICE DAILY AS NEEDED FOR HEARTBURN OR INDIGESTION   hydrochlorothiazide (HYDRODIURIL) 25 MG tablet TAKE 1 TABLET BY MOUTH  DAILY   lisinopril (ZESTRIL) 40 MG tablet Take 1 tablet (40 mg total) by mouth daily.   Multiple Vitamin (MULTIVITAMIN) capsule Take 1 capsule by mouth daily.   Potassium 99 MG TABS Take 1 tablet by mouth daily.   No facility-administered encounter medications on file as of 01/21/2022.    Past Medical History:  Diagnosis Date   Anemia     Back pain    Bulging lumbar disc    Family history of adverse reaction to anesthesia    mother has difficult intubation   Family history of breast cancer    Family history of prostate cancer    Fibroadenoma of breast, left    Fibrocystic breast, right    GERD (gastroesophageal reflux disease)    Heartburn    Hyperlipidemia    Hypertension    Irritable bowel syndrome (IBS)    Knee pain    Lactose intolerance    Menopause    Menorrhagia    Obesity    Papilloma of left breast 07/09/2017   SOBOE (shortness of breath on exertion)    Swelling of both lower extremities    Vitamin D deficiency    Wears glasses     Past Surgical History:  Procedure Laterality Date   BREAST DUCTAL SYSTEM EXCISION Left 11/18/2012   Procedure: EXCISION DUCTAL SYSTEM BREAST;  Surgeon: [name], MD;  Location: [facility];  Service: General;  Laterality: Left;   BREAST LUMPECTOMY WITH NEEDLE LOCALIZATION Left 11/18/2012   Procedure: excise ductal system left breast. subarealar. left partial mastectomy with radiographic guidance;  Surgeon: [name], MD;  Location: [facility];  Service: General;  Laterality: Left;  excise ductal system left breast. subarealar. left partial mastectomy with radiographic guidance   BREAST LUMPECTOMY WITH NEEDLE LOCALIZATION Left 03/27/2015   Procedure: LEFT BREAST LUMPECTOMY  WITH NEEDLE LOCALIZATION TIMES TWO;  Surgeon: Fanny Skates, MD;  Location: [facility];  Service: General;  Laterality: Left;   BREAST LUMPECTOMY WITH RADIOACTIVE SEED LOCALIZATION Left 07/09/2017   Procedure: LEFT BREAST LUMPECTOMY WITH RADIOACTIVE SEED LOCALIZATION;  Surgeon: Fanny Skates, MD;  Location: [facility];  Service: General;  Laterality: Left;  ERAS PATHWAY   BUNIONECTOMY Right    CESAREAN SECTION  [id number]   DILATION AND CURETTAGE OF UTERUS  1998   DILITATION & CURRETTAGE/HYSTROSCOPY WITH NOVASURE ABLATION N/A 08/05/2016    Procedure: DILATATION & CURETTAGE/HYSTEROSCOPY WITH NOVASURE ABLATION;  Surgeon: Princess Bruins, MD;  Location: [facility];  Service: Gynecology;  Laterality: N/A;   TONSILLECTOMY  age 88   UMBILICAL HERNIA REPAIR  2006    Family History  Problem Relation Age of Onset   Hypertension Mother    Arthritis Mother        osteoarthritis   Breast cancer Mother 39   Hyperlipidemia Mother    Cancer Mother    Obesity Mother    Hypertension Father    Arthritis Father        osteoarthritis   Hyperlipidemia Father    Breast cancer Maternal Grandmother  3       bilateral breast cancer   Breast cancer Paternal Aunt 26       bilateral breast cancer   Prostate cancer Paternal Uncle    Tuberculosis Paternal Grandmother    Prostate cancer Paternal Uncle     Social History   Socioeconomic History   Marital status: Married    Spouse name: Katlynn Naser   Number of children: 2   Years of education: Not on file   Highest education level: Not on file  Occupational History   Occupation: Geophysical data processor 2    Comment: Water quality scientist- foster care  Tobacco Use   Smoking status: Never   Smokeless tobacco: Never  Vaping Use   Vaping Use: Never used  Substance and Sexual Activity   Alcohol use: Not Currently   Drug use: No   Sexual activity: Yes    Birth control/protection: Other-see comments, Surgical    Comment: vasectomy  Other Topics Concern   Not on file  Social History Narrative   Not on file   Social Determinants of Health   Financial Resource Strain: Not on file  Food Insecurity: Not on file  Transportation Needs: Not on file  Physical Activity: Not on file  Stress: Not on file  Social Connections: Not on file  Intimate Partner Violence: Not on file    Review of Systems  Constitutional:  Negative for chills and fever.  Eyes:  Negative for blurred vision.  Respiratory:  Negative for cough and shortness of breath.   Cardiovascular:  Positive for  leg swelling. Negative for chest pain.  Gastrointestinal:  Negative for abdominal pain, heartburn, nausea and vomiting.  Neurological:  Negative for dizziness and headaches.       Objective    BP 132/86   Pulse 75   Temp 98 F (36.7 C)   Resp 16   Ht 5' 4.75" (1.645 m)   Wt 183 lb 11.2 oz (83.3 kg)   LMP 04/25/2019   SpO2 99%   BMI 30.81 kg/m   Physical Exam Constitutional:      Appearance: Normal appearance.  HENT:     Head: Normocephalic and atraumatic.     Mouth/Throat:     Mouth: Mucous membranes are moist.     Pharynx: Oropharynx is clear.  Eyes:     Conjunctiva/sclera: Conjunctivae normal.  Cardiovascular:     Rate and Rhythm: Normal rate and regular rhythm.  Pulmonary:     Effort: Pulmonary effort is normal.     Breath sounds: Normal breath sounds.  Musculoskeletal:        General: Swelling present.  Skin:    General: Skin is warm and dry.  Neurological:     General: No focal deficit present.     Mental Status: She is alert. Mental status is at baseline.  Psychiatric:        Mood and Affect: Mood normal.        Behavior: Behavior normal.        Assessment & Plan:   1. Snoring: Does have several risk factors for OSA, referral placed to Pulmonology for sleep testing.   - Ambulatory referral to Pulmonology  2. Essential hypertension/Lower extremity edema: Blood pressure stable today, due for routine screening labs. Has noticed leg swelling has been worse since being on the Amlodipine, will discontinue and see how the swelling does. Recommend elevating legs and wearing compression stockings. Continue Lisinopril 40 mg, HCTZ 25 mg daily. Monitor blood pressure at home  3-4 times a week, follow up in 1 month for recheck. If no change in swelling, pursue echo.   - CBC w/Diff/Platelet - Comprehensive Metabolic Panel (CMET)  3. Gastroesophageal reflux disease, unspecified whether esophagitis present: Stable, continue Pepcid 20 mg daily.   4. Pure  hypercholesterolemia: Check lipid panel, ASCVD risk last year 5%. Continue Lipitor 10 mg daily.   - Lipid Profile  5. Encounter for screening for HIV: Screening due.   - HIV antibody (with reflex)  6. Vitamin D deficiency: Check Vitamin D levels, currently on Vitamin D 2000 IU supplements which will be continued.   - Vitamin D (25 hydroxy)  7. Eczema, unspecified type: Steroid cream refilled.   - triamcinolone cream (KENALOG) 0.1 %; Apply 1 application. topically 2 (two) times daily.  Dispense: 30 g; Refill: 0  Return in about 4 weeks (around 02/18/2022).   Teodora Medici, DO

## 2022-01-24 LAB — COMPREHENSIVE METABOLIC PANEL
ALT: 30 IU/L (ref 0–32)
AST: 23 IU/L (ref 0–40)
Albumin/Globulin Ratio: 1.6 (ref 1.2–2.2)
Albumin: 4.2 g/dL (ref 3.8–4.9)
Alkaline Phosphatase: 91 IU/L (ref 44–121)
BUN/Creatinine Ratio: 17 (ref 9–23)
BUN: 18 mg/dL (ref 6–24)
Bilirubin Total: 0.5 mg/dL (ref 0.0–1.2)
CO2: 25 mmol/L (ref 20–29)
Calcium: 8.3 mg/dL — ABNORMAL LOW (ref 8.7–10.2)
Chloride: 105 mmol/L (ref 96–106)
Creatinine, Ser: 1.03 mg/dL — ABNORMAL HIGH (ref 0.57–1.00)
Globulin, Total: 2.7 g/dL (ref 1.5–4.5)
Glucose: 94 mg/dL (ref 70–99)
Potassium: 3.9 mmol/L (ref 3.5–5.2)
Sodium: 143 mmol/L (ref 134–144)
Total Protein: 6.9 g/dL (ref 6.0–8.5)
eGFR: 64 mL/min/{1.73_m2} (ref >59)

## 2022-01-24 LAB — CBC WITH DIFFERENTIAL/PLATELET
Basophils Absolute: 0.1 10*3/uL (ref 0.0–0.2)
Basos: 1 %
EOS (ABSOLUTE): 0.1 10*3/uL (ref 0.0–0.4)
Eos: 2 %
Hematocrit: 41.3 % (ref 34.0–46.6)
Hemoglobin: 13.9 g/dL (ref 11.1–15.9)
Immature Grans (Abs): 0 10*3/uL (ref 0.0–0.1)
Immature Granulocytes: 0 %
Lymphocytes Absolute: 2.4 10*3/uL (ref 0.7–3.1)
Lymphs: 32 %
MCH: 30.3 pg (ref 26.6–33.0)
MCHC: 33.7 g/dL (ref 31.5–35.7)
MCV: 90 fL (ref 79–97)
Monocytes Absolute: 0.5 10*3/uL (ref 0.1–0.9)
Monocytes: 7 %
Neutrophils Absolute: 4.2 10*3/uL (ref 1.4–7.0)
Neutrophils: 58 %
Platelets: 320 10*3/uL (ref 150–450)
RBC: 4.59 x10E6/uL (ref 3.77–5.28)
RDW: 12.6 % (ref 11.7–15.4)
WBC: 7.3 10*3/uL (ref 3.4–10.8)

## 2022-01-24 LAB — VITAMIN D 25 HYDROXY (VIT D DEFICIENCY, FRACTURES): Vit D, 25-Hydroxy: 54.5 ng/mL (ref 30.0–100.0)

## 2022-01-24 LAB — LIPID PANEL
Chol/HDL Ratio: 3 ratio (ref 0.0–4.4)
Cholesterol, Total: 194 mg/dL (ref 100–199)
HDL: 64 mg/dL (ref >39)
LDL Chol Calc (NIH): 120 mg/dL — ABNORMAL HIGH (ref 0–99)
Triglycerides: 54 mg/dL (ref 0–149)
VLDL Cholesterol Cal: 10 mg/dL (ref 5–40)

## 2022-01-24 LAB — HIV ANTIBODY (ROUTINE TESTING W REFLEX): HIV Screen 4th Generation wRfx: NONREACTIVE

## 2022-02-04 ENCOUNTER — Ambulatory Visit: Payer: Self-pay | Admitting: *Deleted

## 2022-02-04 NOTE — Telephone Encounter (Signed)
Reason for Disposition  [1] Systolic BP  >= 224 OR Diastolic >= 80 AND [8] not taking BP medications  Answer Assessment - Initial Assessment Questions 1. BLOOD PRESSURE: "What is the blood pressure?" "Did you take at least two measurements 5 minutes apart?"     6/2-124/84,6/3- 114/72,6/8 -134/93,132/88,6/10- 131/97,  6/14 144/99 2. ONSET: "When did you take your blood pressure?"     1:15 3. HOW: "How did you obtain the blood pressure?" (e.g., visiting nurse, automatic home BP monitor)     Automatic- arm 4. HISTORY: "Do you have a history of high blood pressure?"     yes 5. MEDICATIONS: "Are you taking any medications for blood pressure?" "Have you missed any doses recently?"     No- discontinued amlodipine- swelling is better  6. OTHER SYMPTOMS: "Do you have any symptoms?" (e.g., headache, chest pain, blurred vision, difficulty breathing, weakness)     Yesterday slight headache 7. PREGNANCY: "Is there any chance you are pregnant?" "When was your last menstrual period?"  Protocols used: Blood Pressure - High-A-AH

## 2022-02-04 NOTE — Telephone Encounter (Signed)
Please advise she has an appt on 6/29 do you want to add something or move up appt or just wait til appt.  Swelling is better since stopping med

## 2022-02-04 NOTE — Telephone Encounter (Signed)
  Chief Complaint: BP seems to be climbing back up since stopped BP medication  Symptoms: none- slight headache yesterday- but went away Frequency: see dates for BP reading Pertinent Negatives: Patient denies chest pain, blurred vision, difficulty breathing, weakness Disposition: '[]'$ ED /'[]'$ Urgent Care (no appt availability in office) / '[]'$ Appointment(In office/virtual)/ '[]'$  [facility]/ '[]'$ Home Care/ '[]'$ Refused Recommended Disposition /'[]'$ [facility]/ '[x]'$  Follow-up with PCP Additional Notes: Patient was advised to stop Amlodipine due to swelling- this has helped- no swelling. Patient has been monitoring her BP and it does seem to be creeping up- she was told to call. Patient has appointment 6/29-  does provider want to start her on new BP medication before that appointment?

## 2022-02-04 NOTE — Telephone Encounter (Signed)
Please advise she has an appt on 6/29 do u need to restart me

## 2022-02-05 NOTE — Telephone Encounter (Signed)
Pt.notified

## 2022-02-16 ENCOUNTER — Other Ambulatory Visit: Payer: Self-pay | Admitting: Family Medicine

## 2022-02-19 ENCOUNTER — Ambulatory Visit: Payer: Managed Care, Other (non HMO) | Admitting: Internal Medicine

## 2022-02-19 ENCOUNTER — Encounter: Payer: Self-pay | Admitting: Internal Medicine

## 2022-02-19 VITALS — BP 122/80 | HR 99 | Temp 98.2°F | Resp 16 | Ht 64.75 in | Wt 180.7 lb

## 2022-02-19 DIAGNOSIS — I10 Essential (primary) hypertension: Secondary | ICD-10-CM

## 2022-02-19 DIAGNOSIS — R600 Localized edema: Secondary | ICD-10-CM | POA: Diagnosis not present

## 2022-02-19 DIAGNOSIS — M5431 Sciatica, right side: Secondary | ICD-10-CM

## 2022-02-19 MED ORDER — CYCLOBENZAPRINE HCL 5 MG PO TABS: 5.0000 mg | ORAL_TABLET | Freq: Three times a day (TID) | ORAL | 1 refills | Status: DC | PRN

## 2022-02-19 NOTE — Patient Instructions (Signed)
It was great seeing you today!  Plan discussed at today's visit: -Continue blood pressure medication as you have been, continue checking at home -Stretches, moist heat, Flexeril and anti-inflammatories as needed for back   Follow up in: August CPE   Take care and let us know if you have any questions or concerns prior to your next visit.  Dr. Rosana Berger

## 2022-02-19 NOTE — Progress Notes (Signed)
Established Patient Office Visit  Subjective    Patient ID: Rachael Russell, female    DOB: 29-Jun-1966  Age: 56 y.o. MRN: [id number]  CC:  Chief Complaint  Patient presents with   Hypertension   Medication Refill    HPI Rachael Russell presents for follow up.   Hypertension: -Medications: Amlodipine 5 discontinued at LOV due to lower extremity swelling but then the patient called back saying blood pressure was starting to creep back up so now on 2.5 mg at night, HCTZ 25, Lisinopril 40. Also on KCl 99 mg. Had been on Torsemide in the past for swelling but this was stopped and she was started on HCTZ instead. States today that the swelling completely resolved when she discontinued the Amlodipine and she now has a very small amount on the 2.5  mg dose.  -Patient is compliant with above medications and reports no side effects. -Checking BP at home (average): 120-130/80-90 but had home cuff on upside down -Denies any headache, SOB, CP, vision changes or symptoms of hypotension -Diet: actively trying to lose weight - yogurt and granola and coffee, lunch with meat and salad and dinner meat and vegetables  -Exercise: work out groups Monday and Saturdays - working with a Physiological scientist at [employer] - doing cardio and weights   Right Sided Sciatica: -Mild right lumbar pain after working out with right scitica to her feet -Stretching before and after work outs as well as before bed which has helped -Takes Flexeril 5 mg and 400 mg Ibuprofen when pain is bad which is helping her  -Also used topicals like Voltaren   HLD: -Medications: Lipitor 10  -Patient is compliant with above medications and reports no side effects.  -Last lipid panel: Lipid Panel     Component Value Date/Time   CHOL 194 01/23/2022 1018   TRIG 54 01/23/2022 1018   HDL 64 01/23/2022 1018   CHOLHDL 3.0 01/23/2022 1018   CHOLHDL 4 05/11/2011 1230   VLDL 11.0 05/11/2011 1230   LDLCALC 120 (H) 01/23/2022 1018   LDLDIRECT  149.9 05/11/2011 1230   LABVLDL 10 01/23/2022 1018   The 10-year ASCVD risk score (Arnett DK, et al., 2019) is: 3.8%   Values used to calculate the score:     Age: 32 years     Sex: Female     Is Non-Hispanic African American: Yes     Diabetic: No     Tobacco smoker: No     Systolic Blood Pressure: 381 mmHg     Is BP treated: Yes     HDL Cholesterol: 64 mg/dL     Total Cholesterol: 194 mg/dL  GERD: -Currently on Pepcid 20 mg  -Controlling symptoms. Denies abdominal pain, nausea, vomiting - symptoms present as chest pains.   Vitamin D Deficiency: -Currently on 2000 IU daily  -Last Vitamin D 53.7 8/22  Health Maintenance: -Blood work UTD -Mammogram - has had multiple abnormal mammograms with 3 lumpectomies, last one in 2018. Last mammogram 12/22. Has an extensive family history of breast cancer, is BRCA negative.  -Colon cancer screening: 09/24/2016, repeat in 10 years    Outpatient Encounter Medications as of 02/19/2022  Medication Sig   amLODipine (NORVASC) 5 MG tablet TAKE 1 TABLET BY MOUTH  DAILY (Patient taking differently: Take 2.5 mg by mouth daily.)   atorvastatin (LIPITOR) 10 MG tablet TAKE 1 TABLET BY MOUTH  DAILY   Cholecalciferol 2000 units TABS Take 1 tablet by mouth daily.   cyclobenzaprine (  FLEXERIL) 10 MG tablet Take 10 mg by mouth 3 (three) times daily as needed for muscle spasms.   famotidine (PEPCID) 20 MG tablet TAKE 1 TABLET(20 MG) BY MOUTH TWICE DAILY AS NEEDED FOR HEARTBURN OR INDIGESTION   hydrochlorothiazide (HYDRODIURIL) 25 MG tablet TAKE 1 TABLET BY MOUTH  DAILY   lisinopril (ZESTRIL) 40 MG tablet TAKE 1 TABLET(40 MG) BY MOUTH DAILY   Multiple Vitamin (MULTIVITAMIN) capsule Take 1 capsule by mouth daily.   Potassium 99 MG TABS Take 1 tablet by mouth daily.   triamcinolone cream (KENALOG) 0.1 % Apply 1 application. topically 2 (two) times daily.   No facility-administered encounter medications on file as of 02/19/2022.    Past Medical History:   Diagnosis Date   Anemia    Back pain    Bulging lumbar disc    Family history of adverse reaction to anesthesia    mother has difficult intubation   Family history of breast cancer    Family history of prostate cancer    Fibroadenoma of breast, left    Fibrocystic breast, right    GERD (gastroesophageal reflux disease)    Heartburn    Hyperlipidemia    Hypertension    Irritable bowel syndrome (IBS)    Knee pain    Lactose intolerance    Menopause    Menorrhagia    Obesity    Papilloma of left breast 07/09/2017   SOBOE (shortness of breath on exertion)    Swelling of both lower extremities    Vitamin D deficiency    Wears glasses     Past Surgical History:  Procedure Laterality Date   BREAST DUCTAL SYSTEM EXCISION Left 11/18/2012   Procedure: EXCISION DUCTAL SYSTEM BREAST;  Surgeon: [name], MD;  Location: [facility];  Service: General;  Laterality: Left;   BREAST LUMPECTOMY WITH NEEDLE LOCALIZATION Left 11/18/2012   Procedure: excise ductal system left breast. subarealar. left partial mastectomy with radiographic guidance;  Surgeon: [name], MD;  Location: [facility];  Service: General;  Laterality: Left;  excise ductal system left breast. subarealar. left partial mastectomy with radiographic guidance   BREAST LUMPECTOMY WITH NEEDLE LOCALIZATION Left 03/27/2015   Procedure: LEFT BREAST LUMPECTOMY  WITH NEEDLE LOCALIZATION TIMES TWO;  Surgeon: Fanny Skates, MD;  Location: [facility];  Service: General;  Laterality: Left;   BREAST LUMPECTOMY WITH RADIOACTIVE SEED LOCALIZATION Left 07/09/2017   Procedure: LEFT BREAST LUMPECTOMY WITH RADIOACTIVE SEED LOCALIZATION;  Surgeon: Fanny Skates, MD;  Location: [facility];  Service: General;  Laterality: Left;  ERAS PATHWAY   BUNIONECTOMY Right    CESAREAN SECTION  1610,9604   DILATION AND CURETTAGE OF UTERUS  1998   DILITATION & CURRETTAGE/HYSTROSCOPY WITH NOVASURE ABLATION  N/A 08/05/2016   Procedure: DILATATION & CURETTAGE/HYSTEROSCOPY WITH NOVASURE ABLATION;  Surgeon: Princess Bruins, MD;  Location: [facility];  Service: Gynecology;  Laterality: N/A;   TONSILLECTOMY  age 35   UMBILICAL HERNIA REPAIR  2006    Family History  Problem Relation Age of Onset   Hypertension Mother    Arthritis Mother        osteoarthritis   Breast cancer Mother 64   Hyperlipidemia Mother    Cancer Mother    Obesity Mother    Hypertension Father    Arthritis Father        osteoarthritis   Hyperlipidemia Father    Breast cancer Maternal Grandmother 72       bilateral breast  cancer   Breast cancer Paternal Aunt 16       bilateral breast cancer   Prostate cancer Paternal Uncle    Tuberculosis Paternal Grandmother    Prostate cancer Paternal Uncle     Social History   Socioeconomic History   Marital status: Married    Spouse name: Lynlee Stratton   Number of children: 2   Years of education: Not on file   Highest education level: Not on file  Occupational History   Occupation: Geophysical data processor 2    Comment: Water quality scientist- foster care  Tobacco Use   Smoking status: Never   Smokeless tobacco: Never  Vaping Use   Vaping Use: Never used  Substance and Sexual Activity   Alcohol use: Not Currently   Drug use: No   Sexual activity: Yes    Birth control/protection: Other-see comments, Surgical    Comment: vasectomy  Other Topics Concern   Not on file  Social History Narrative   Not on file   Social Determinants of Health   Financial Resource Strain: Not on file  Food Insecurity: Not on file  Transportation Needs: Not on file  Physical Activity: Not on file  Stress: Not on file  Social Connections: Not on file  Intimate Partner Violence: Not on file    Review of Systems  Constitutional:  Negative for chills and fever.  Eyes:  Negative for blurred vision.  Respiratory:  Negative for cough and shortness of breath.    Cardiovascular:  Positive for leg swelling. Negative for chest pain.  Gastrointestinal:  Negative for abdominal pain, heartburn, nausea and vomiting.  Musculoskeletal:  Positive for back pain.  Neurological:  Negative for dizziness and headaches.        Objective    BP 122/80   Pulse 99   Temp 98.2 F (36.8 C)   Resp 16   Ht 5' 4.75" (1.645 m)   Wt 180 lb 11.2 oz (82 kg)   LMP 04/25/2019   SpO2 97%   BMI 30.30 kg/m   Physical Exam Constitutional:      Appearance: Normal appearance.  HENT:     Head: Normocephalic and atraumatic.  Eyes:     Conjunctiva/sclera: Conjunctivae normal.  Cardiovascular:     Rate and Rhythm: Normal rate and regular rhythm.  Pulmonary:     Effort: Pulmonary effort is normal.     Breath sounds: Normal breath sounds.  Musculoskeletal:        General: Tenderness present.     Right lower leg: No edema.     Left lower leg: No edema.     Comments: Mild right sided lumbar hypertonicity at the level of L2-L4  Skin:    General: Skin is warm and dry.  Neurological:     General: No focal deficit present.     Mental Status: She is alert. Mental status is at baseline.  Psychiatric:        Mood and Affect: Mood normal.        Behavior: Behavior normal.       Assessment & Plan:   1. Essential hypertension/Lower extremity edema: Blood pressure perfect today without lower extremity swelling. Continue HCTZ 25 mg, Lisinopril 40 mg and Amlodipine 2.5 mg at night and monitoring blood pressure at home.   2. Sciatica of right side: Mild hypertrophy on exam, discussed stretching before and after work outs to prevent injury, moist head and NSAIDs and muscle relaxer as needed. Flexeril 5 mg PRN refilled today.   -  cyclobenzaprine (FLEXERIL) 5 MG tablet; Take 1 tablet (5 mg total) by mouth 3 (three) times daily as needed for muscle spasms.  Dispense: 30 tablet; Refill: 1   Return for CPE in August .   Teodora Medici, DO

## 2022-03-28 ENCOUNTER — Other Ambulatory Visit: Payer: Self-pay | Admitting: Family Medicine

## 2022-04-01 ENCOUNTER — Encounter (INDEPENDENT_AMBULATORY_CARE_PROVIDER_SITE_OTHER): Payer: Self-pay

## 2022-04-06 ENCOUNTER — Other Ambulatory Visit: Payer: Managed Care, Other (non HMO)

## 2022-04-08 ENCOUNTER — Ambulatory Visit: Payer: Self-pay

## 2022-04-08 NOTE — Telephone Encounter (Signed)
  Chief Complaint: Vaginal itching Symptoms: itching Frequency: today Pertinent Negatives: Patient denies discharge Disposition: '[]'$ ED /'[]'$ Urgent Care (no appt availability in office) / '[]'$ Appointment(In office/virtual)/ '[]'$  [facility]/ '[x]'$ Home Care/ '[]'$ Refused Recommended Disposition /'[]'$ [facility]/ '[]'$  Follow-up with PCP Additional Notes: Pt believes that she has a yeast infection from antibiotics. PT does not want to use OTC medications. She does not want to put anything in her vagina. PT would like for provider to call in oral medication. Pt has appt with Dr. Rosana Berger on Friday.    Summary: yeast infection   Pt had dental work and was placed on an antibiotic and now possibly has a yeast infection / she asked if something oral can be called in / please advise      Reason for Disposition  [1] Symptoms of a yeast infection (i.e., itchy, white discharge, not bad smelling) AND [2] feels like prior vaginal yeast infections  Answer Assessment - Initial Assessment Questions 1. SYMPTOM: "What's the main symptom you're concerned about?" (e.g., pain, itching, dryness)     Itching 2. LOCATION: "Where is the  itch located?" (e.g., inside/outside, left/right)     inside 3. ONSET: "When did the  itch  start?"     today 4. PAIN: "Is there any pain?" If Yes, ask: "How bad is it?" (Scale: 1-10; mild, moderate, severe)   -  MILD (1-3): Doesn't interfere with normal activities.    -  MODERATE (4-7): Interferes with normal activities (e.g., work or school) or awakens from sleep.     -  SEVERE (8-10): Excruciating pain, unable to do any normal activities.     0/10 5. ITCHING: "Is there any itching?" If Yes, ask: "How bad is it?" (Scale: 1-10; mild, moderate, severe)     6-7/10 6. CAUSE: "What do you think is causing the discharge?" "Have you had the same problem before? What happened then?"     Antibiotics and prednisone 7. OTHER SYMPTOMS: "Do you have any other symptoms?" (e.g., fever,  itching, vaginal bleeding, pain with urination, injury to genital area, vaginal foreign body)     no 8. PREGNANCY: "Is there any chance you are pregnant?" "When was your last menstrual period?"     na  Protocols used: Vaginal Symptoms-A-AH

## 2022-04-09 ENCOUNTER — Other Ambulatory Visit: Payer: Self-pay | Admitting: Internal Medicine

## 2022-04-09 DIAGNOSIS — B379 Candidiasis, unspecified: Secondary | ICD-10-CM

## 2022-04-09 MED ORDER — FLUCONAZOLE 150 MG PO TABS: 150.0000 mg | ORAL_TABLET | Freq: Once | ORAL | 0 refills | Status: AC

## 2022-04-09 NOTE — Telephone Encounter (Signed)
Called pt and made aware of prescription and how to take. Pt gave verbal understanding.

## 2022-04-10 ENCOUNTER — Ambulatory Visit (INDEPENDENT_AMBULATORY_CARE_PROVIDER_SITE_OTHER): Payer: Managed Care, Other (non HMO) | Admitting: Internal Medicine

## 2022-04-10 ENCOUNTER — Encounter: Payer: Self-pay | Admitting: Internal Medicine

## 2022-04-10 VITALS — BP 128/82 | HR 86 | Resp 16 | Ht 64.0 in | Wt 176.0 lb

## 2022-04-10 DIAGNOSIS — Z0000 Encounter for general adult medical examination without abnormal findings: Secondary | ICD-10-CM

## 2022-04-10 DIAGNOSIS — Z131 Encounter for screening for diabetes mellitus: Secondary | ICD-10-CM

## 2022-04-10 DIAGNOSIS — Z23 Encounter for immunization: Secondary | ICD-10-CM | POA: Diagnosis not present

## 2022-04-10 DIAGNOSIS — M5431 Sciatica, right side: Secondary | ICD-10-CM

## 2022-04-10 LAB — POCT GLYCOSYLATED HEMOGLOBIN (HGB A1C): Hemoglobin A1C: 5.7 % — AB (ref 4.0–5.6)

## 2022-04-10 MED ORDER — CYCLOBENZAPRINE HCL 5 MG PO TABS: 5.0000 mg | ORAL_TABLET | Freq: Three times a day (TID) | ORAL | 1 refills | Status: DC | PRN

## 2022-04-10 NOTE — Patient Instructions (Addendum)
It was great seeing you today!  Plan discussed at today's visit: -Shingles vaccine today, second dose due in 2-6 months -Flexeril refilled   Follow up in: October for nurse visit for flu, 4 months for follow up   Take care and let us know if you have any questions or concerns prior to your next visit.  Dr. Rosana Berger

## 2022-04-10 NOTE — Progress Notes (Signed)
Name: Rachael Russell   MRN: [id number]    DOB: 07/18/1966   Date:04/10/2022       Progress Note  Subjective  Chief Complaint  Chief Complaint  Patient presents with   Annual Exam    HPI  Patient presents for annual CPE.  Diet: well rounded, eats healthy  Exercise: 4-5 times a week, walking and strength training   Last Eye Exam: April 2023  Last Dental Exam: UTD   Depression: Phq 9 is  negative    04/10/2022   10:19 AM 02/19/2022    3:03 PM 01/21/2022    8:57 AM 10/22/2020    9:08 AM 04/10/2020   12:33 PM  Depression screen PHQ 2/9  Decreased Interest 0 0 0 0 0  Down, Depressed, Hopeless 0 0 0 0 0  PHQ - 2 Score 0 0 0 0 0  Altered sleeping 0 0 0 1 2  Tired, decreased energy 0 0 0 1 1  Change in appetite 0 0 0 1 0  Feeling bad or failure about yourself  0 0 0 0 0  Trouble concentrating 0 0 0 0 0  Moving slowly or fidgety/restless 0 0 0 0 0  Suicidal thoughts 0 0 0 0 0  PHQ-9 Score 0 0 0 3 3  Difficult doing work/chores  Not difficult at all Not difficult at all Not difficult at all Not difficult at all   Hypertension: BP Readings from Last 3 Encounters:  04/10/22 128/82  02/19/22 122/80  01/21/22 132/86   Obesity: Wt Readings from Last 3 Encounters:  04/10/22 176 lb (79.8 kg)  02/19/22 180 lb 11.2 oz (82 kg)  01/21/22 183 lb 11.2 oz (83.3 kg)   BMI Readings from Last 3 Encounters:  04/10/22 30.21 kg/m  02/19/22 30.30 kg/m  01/21/22 30.81 kg/m     Vaccines:   HPV: n/a Tdap: 04/2019 Shingrix: Due today Pneumonia: No Flu: Due COVID-19: 3 vaccines   Hep C Screening: yes STD testing and prevention (HIV/chl/gon/syphilis): no concerns Intimate partner violence: negative screen  Menstrual History/LMP/Abnormal Bleeding: npt having periods currently  Discussed importance of follow up if any post-menopausal bleeding: yes  Incontinence Symptoms: negative for symptoms   Breast cancer:  - Last Mammogram: 12/22, yearly mammogram ordered  - BRCA gene  screening: n/a  Osteoporosis Prevention : Discussed high calcium and vitamin D supplementation, weight bearing exercises Bone density :no   Cervical cancer screening: Pap 3/22 negative   Skin cancer: Discussed monitoring for atypical lesions  Colorectal cancer: colonoscopy 09/2016, repeat in 10 years  Lung cancer:  Low Dose CT Chest recommended if Age 51-80 years, 20 pack-year currently smoking OR have quit w/in 15years. Patient does not qualify for screen   ECG: 05/22/20  Advanced Care Planning: A voluntary discussion about advance care planning including the explanation and discussion of advance directives.  Discussed health care proxy and Living will, and the patient was able to identify a health care proxy as husband Lauralei Clouse.  Patient does not have a living will and power of attorney of health care.  Lipids: Lab Results  Component Value Date   CHOL 194 01/23/2022   CHOL 209 (H) 04/04/2021   CHOL 217 (H) 10/22/2020   Lab Results  Component Value Date   HDL 64 01/23/2022   HDL 68 04/04/2021   HDL 76 10/22/2020   Lab Results  Component Value Date   LDLCALC 120 (H) 01/23/2022   LDLCALC 129 (H) 04/04/2021   LDLCALC 128 (H)  10/22/2020   Lab Results  Component Value Date   TRIG 54 01/23/2022   TRIG 65 04/04/2021   TRIG 75 10/22/2020   Lab Results  Component Value Date   CHOLHDL 3.0 01/23/2022   CHOLHDL 3.1 04/04/2021   CHOLHDL 2.9 10/22/2020   Lab Results  Component Value Date   LDLDIRECT 149.9 05/11/2011   LDLDIRECT 123.2 01/03/2007    Glucose: Glucose  Date Value Ref Range Status  01/23/2022 94 70 - 99 mg/dL Final  04/04/2021 79 65 - 99 mg/dL Final  10/22/2020 86 65 - 99 mg/dL Final   Glucose, Bld  Date Value Ref Range Status  05/22/2020 96 70 - 99 mg/dL Final    Comment:    Glucose reference range applies only to samples taken after fasting for at least 8 hours.  07/07/2017 74 65 - 99 mg/dL Final  08/05/2016 95 65 - 99 mg/dL Final    Patient  Active Problem List   Diagnosis Date Noted   Bunion, right foot 04/10/2021   Depression 03/24/2021   At risk for diabetes mellitus 12/17/2020   Gastroesophageal reflux disease 11/19/2020   At risk for impaired metabolic function 34/19/6222   Menopause 11/14/2020   Prediabetes 11/05/2020   Other hyperlipidemia 11/05/2020   At risk for deficient intake of food 11/05/2020   Class 1 obesity with serious comorbidity and body mass index (BMI) of 31.0 to 31.9 in adult 10/22/2020   Pain in right wrist 09/12/2020   Class 1 obesity due to excess calories with body mass index (BMI) of 33.0 to 33.9 in adult 05/08/2020   Screening mammogram, encounter for 04/25/2019   Dyspnea 10/31/2018   Low back pain 08/08/2018   Genetic testing 12/01/2017   Family history of breast cancer    Family history of prostate cancer    Papilloma of left breast 07/09/2017   Pedal edema 02/23/2017   Vitamin D deficiency 04/26/2015   Fatigue 11/06/2014   Hemorrhoids, external 10/13/2013   Cervical disc disorder with radiculopathy of cervical region 09/15/2013   History of herpes zoster 03/07/2013   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;  Service: General;  Laterality: Left;   BREAST LUMPECTOMY WITH NEEDLE LOCALIZATION Left 11/18/2012   Procedure: excise ductal system left breast. subarealar. left partial mastectomy with radiographic guidance;  Surgeon: [name], MD;  Location: [facility];  Service: General;   Laterality: Left;  excise ductal system left breast. subarealar. left partial mastectomy with radiographic guidance   BREAST LUMPECTOMY WITH NEEDLE LOCALIZATION Left 03/27/2015   Procedure: LEFT BREAST LUMPECTOMY  WITH NEEDLE LOCALIZATION TIMES TWO;  Surgeon: Fanny Skates, MD;  Location: [place] [facility];  Service: General;  Laterality: Left;   BREAST LUMPECTOMY WITH RADIOACTIVE SEED LOCALIZATION Left 07/09/2017   Procedure: LEFT BREAST LUMPECTOMY WITH RADIOACTIVE SEED LOCALIZATION;  Surgeon: Fanny Skates, MD;  Location: [facility];  Service: General;  Laterality: Left;  ERAS PATHWAY   BUNIONECTOMY Right    CESAREAN SECTION  [id number]   [place]  DILITATION & CURRETTAGE/HYSTROSCOPY WITH NOVASURE ABLATION N/A 08/05/2016   Procedure: DILATATION & CURETTAGE/HYSTEROSCOPY WITH NOVASURE ABLATION;  Surgeon: Princess Bruins, MD;  Location: [place] [facility];  Service: Gynecology;  Laterality: N/A;   TONSILLECTOMY  age 41   UMBILICAL HERNIA REPAIR  2006    Family History  Problem Relation Age of Onset   Hypertension Mother    Arthritis Mother        osteoarthritis   Breast cancer Mother 43   Hyperlipidemia Mother    Cancer Mother    Obesity Mother    Hypertension Father    Arthritis Father        osteoarthritis   Hyperlipidemia Father    Breast cancer Maternal Grandmother 30       bilateral breast cancer   Breast cancer Paternal Aunt 80       bilateral breast cancer   Prostate cancer Paternal Uncle    Tuberculosis Paternal Grandmother    Prostate cancer Paternal Uncle     Social History   Socioeconomic History   Marital status: Married    Spouse name: Kearie Mennen   Number of children: 2   Years of education: Not on file   Highest education level: Not on file  Occupational History   Occupation: Geophysical data processor 2    Comment: Water quality scientist- foster care  Tobacco Use   Smoking status: Never   Smokeless tobacco: Never   Vaping Use   Vaping Use: Never used  Substance and Sexual Activity   Alcohol use: Not Currently   Drug use: No   Sexual activity: Yes    Birth control/protection: Other-see comments, Surgical    Comment: vasectomy  Other Topics Concern   Not on file  Social History Narrative   Not on file   Social Determinants of Health   Financial Resource Strain: Low Risk  (04/10/2022)   Overall Financial Resource Strain (CARDIA)    Difficulty of Paying Living Expenses: Not hard at all  Food Insecurity: No Food Insecurity (04/10/2022)   Hunger Vital Sign    Worried About Running Out of Food in the Last Year: Never true    Ran Out of Food in the Last Year: Never true  Transportation Needs: No Transportation Needs (04/10/2022)   PRAPARE - Hydrologist (Medical): No    Lack of Transportation (Non-Medical): No  Physical Activity: Sufficiently Active (04/10/2022)   Exercise Vital Sign    Days of Exercise per Week: 4 days    Minutes of Exercise per Session: 40 min  Stress: No Stress Concern Present (04/10/2022)   [place]    Feeling of Stress : Not at all  Social Connections: [place] (04/10/2022)   Social Connection and Isolation Panel [NHANES]    Frequency of Communication with Friends and Family: More than three times a week    Frequency of Social Gatherings with Friends and Family: Three times a week    Attends Religious Services: More than 4 times per year    Active Member of Clubs or Organizations: Yes    Attends Archivist Meetings: 1 to 4 times per year    Marital Status: Married  Human resources officer Violence: Not At Risk (04/10/2022)   Humiliation, Afraid, Rape, and Kick questionnaire    Fear of Current or Ex-Partner: No    Emotionally Abused: No    Physically Abused: No    Sexually Abused: No  Current Outpatient Medications:    amLODipine (NORVASC) 5 MG tablet, TAKE  1 TABLET BY MOUTH  DAILY (Patient taking differently: Take 2.5 mg by mouth daily.), Disp: 90 tablet, Rfl: 3   atorvastatin (LIPITOR) 10 MG tablet, TAKE 1 TABLET BY MOUTH  DAILY, Disp: 90 tablet, Rfl: 3   Cholecalciferol 2000 units TABS, Take 1 tablet by mouth daily., Disp: , Rfl:    cyclobenzaprine (FLEXERIL) 5 MG tablet, Take 1 tablet (5 mg total) by mouth 3 (three) times daily as needed for muscle spasms., Disp: 30 tablet, Rfl: 1   famotidine (PEPCID) 20 MG tablet, TAKE 1 TABLET(20 MG) BY MOUTH TWICE DAILY AS NEEDED FOR HEARTBURN OR INDIGESTION, Disp: 180 tablet, Rfl: 1   hydrochlorothiazide (HYDRODIURIL) 25 MG tablet, TAKE 1 TABLET BY MOUTH  DAILY, Disp: 90 tablet, Rfl: 3   lisinopril (ZESTRIL) 40 MG tablet, TAKE 1 TABLET(40 MG) BY MOUTH DAILY, Disp: 90 tablet, Rfl: 0   Multiple Vitamin (MULTIVITAMIN) capsule, Take 1 capsule by mouth daily., Disp: , Rfl:    Potassium 99 MG TABS, Take 1 tablet by mouth daily., Disp: , Rfl:    triamcinolone cream (KENALOG) 0.1 %, Apply 1 application. topically 2 (two) times daily., Disp: 30 g, Rfl: 0  Allergies  Allergen Reactions   Ferrous Sulfate     constipation   Penicillins Other (See Comments)    Unknown childhood reaction   Prilosec [Omeprazole Magnesium]     Dizziness    Chlorhexidine Gluconate Rash     Review of Systems  All other systems reviewed and are negative.   Objective  Vitals:   04/10/22 1020  BP: 128/82  Pulse: 86  Resp: 16  SpO2: 99%  Weight: 176 lb (79.8 kg)  Height: 5' 4"  (1.626 m)    Body mass index is 30.21 kg/m.  Physical Exam Constitutional:      Appearance: Normal appearance.  HENT:     Head: Normocephalic and atraumatic.  Eyes:     Conjunctiva/sclera: Conjunctivae normal.  Cardiovascular:     Rate and Rhythm: Normal rate and regular rhythm.  Pulmonary:     Effort: Pulmonary effort is normal.     Breath sounds: Normal breath sounds.  Chest:  Breasts:    Right: Normal. No swelling, bleeding, inverted  nipple, mass, nipple discharge, skin change or tenderness.     Left: Normal. No swelling, bleeding, inverted nipple, mass, nipple discharge, skin change or tenderness.  Musculoskeletal:     Right lower leg: No edema.     Left lower leg: No edema.  Lymphadenopathy:     Upper Body:     Right upper body: No supraclavicular, axillary or pectoral adenopathy.     Left upper body: No supraclavicular, axillary or pectoral adenopathy.  Skin:    General: Skin is warm and dry.  Neurological:     General: No focal deficit present.     Mental Status: She is alert. Mental status is at baseline.  Psychiatric:        Mood and Affect: Mood normal.        Behavior: Behavior normal.     Recent Results (from the past 2160 hour(s))  CBC w/Diff/Platelet     Status: None   Collection Time: 01/23/22 10:18 AM  Result Value Ref Range   WBC 7.3 3.4 - 10.8 x10E3/uL   RBC 4.59 3.77 - 5.28 x10E6/uL   Hemoglobin 13.9 11.1 - 15.9 g/dL   Hematocrit 41.3 34.0 - 46.6 %   MCV 90 79 -  97 fL   MCH 30.3 26.6 - 33.0 pg   MCHC 33.7 31.5 - 35.7 g/dL   RDW 12.6 11.7 - 15.4 %   Platelets 320 150 - 450 x10E3/uL   Neutrophils 58 Not Estab. %   Lymphs 32 Not Estab. %   Monocytes 7 Not Estab. %   Eos 2 Not Estab. %   Basos 1 Not Estab. %   Neutrophils Absolute 4.2 1.4 - 7.0 x10E3/uL   Lymphocytes Absolute 2.4 0.7 - 3.1 x10E3/uL   Monocytes Absolute 0.5 0.1 - 0.9 x10E3/uL   EOS (ABSOLUTE) 0.1 0.0 - 0.4 x10E3/uL   Basophils Absolute 0.1 0.0 - 0.2 x10E3/uL   Immature Granulocytes 0 Not Estab. %   Immature Grans (Abs) 0.0 0.0 - 0.1 x10E3/uL  Lipid Profile     Status: Abnormal   Collection Time: 01/23/22 10:18 AM  Result Value Ref Range   Cholesterol, Total 194 100 - 199 mg/dL   Triglycerides 54 0 - 149 mg/dL   HDL 64 >39 mg/dL   VLDL Cholesterol Cal 10 5 - 40 mg/dL   LDL Chol Calc (NIH) 120 (H) 0 - 99 mg/dL   Chol/HDL Ratio 3.0 0.0 - 4.4 ratio    Comment:                                   T. Chol/HDL Ratio                                              Men  Women                               1/2 Avg.Risk  3.4    3.3                                   Avg.Risk  5.0    4.4                                2X Avg.Risk  9.6    7.1                                3X Avg.Risk 23.4   11.0   Vitamin D (25 hydroxy)     Status: None   Collection Time: 01/23/22 10:18 AM  Result Value Ref Range   Vit D, 25-Hydroxy 54.5 30.0 - 100.0 ng/mL    Comment: Vitamin D deficiency has been defined by the [place] practice guideline as a level of serum 25-OH vitamin D less than 20 ng/mL (1,2). The Endocrine Society went on to further define vitamin D insufficiency as a level between 21 and 29 ng/mL (2). 1. IOM (Institute of Medicine). 2010. Dietary reference    intakes for calcium and D. [place]: The    [employer]. 2. Holick MF, Binkley [place], Bischoff-Ferrari HA, et al.    Evaluation, treatment, and prevention of vitamin D    deficiency: an Endocrine Society clinical practice    guideline. JCEM. 2011 Jul; 96(7):1911-30.  HIV antibody (with reflex)     Status: None   Collection Time: 01/23/22 10:18 AM  Result Value Ref Range   HIV Screen 4th Generation wRfx Non Reactive Non Reactive    Comment: HIV Negative HIV-1/HIV-2 antibodies and HIV-1 p24 antigen were NOT detected. There is no laboratory evidence of HIV infection.   Comprehensive Metabolic Panel (CMET)     Status: Abnormal   Collection Time: 01/23/22 10:18 AM  Result Value Ref Range   Glucose 94 70 - 99 mg/dL   BUN 18 6 - 24 mg/dL   Creatinine, Ser 1.03 (H) 0.57 - 1.00 mg/dL   eGFR 64 >59 mL/min/1.73   BUN/Creatinine Ratio 17 9 - 23   Sodium 143 134 - 144 mmol/L   Potassium 3.9 3.5 - 5.2 mmol/L   Chloride 105 96 - 106 mmol/L   CO2 25 20 - 29 mmol/L   Calcium 8.3 (L) 8.7 - 10.2 mg/dL   Total Protein 6.9 6.0 - 8.5 g/dL   Albumin 4.2 3.8 - 4.9 g/dL   Globulin, Total 2.7 1.5 - 4.5 g/dL    Albumin/Globulin Ratio 1.6 1.2 - 2.2   Bilirubin Total 0.5 0.0 - 1.2 mg/dL   Alkaline Phosphatase 91 44 - 121 IU/L   AST 23 0 - 40 IU/L   ALT 30 0 - 32 IU/L     Fall Risk:    04/10/2022   10:19 AM 02/19/2022    3:00 PM 01/21/2022    8:57 AM  Fall Risk   Falls in the past year? 0 0 0  Number falls in past yr: 0 0 0  Injury with Fall? 0 0 0  Risk for fall due to : No Fall Risks    Follow up Falls prevention discussed       Functional Status Survey: Is the patient deaf or have difficulty hearing?: No Does the patient have difficulty seeing, even when wearing glasses/contacts?: No Does the patient have difficulty concentrating, remembering, or making decisions?: No Does the patient have difficulty walking or climbing stairs?: No Does the patient have difficulty dressing or bathing?: No Does the patient have difficulty doing errands alone such as visiting a doctor's office or shopping?: No   Assessment & Plan  1. Well adult exam/Need for shingles vaccine:  Shingrix vaccine administered today, second dose in 2-6 months.   - Zoster Recombinant (Shingrix )  2. Diabetes mellitus screening: A1c in the office today 5.7.  - POCT HgB A1C  3. Sciatica of right side: Muscle relaxer refilled today.   - cyclobenzaprine (FLEXERIL) 5 MG tablet; Take 1 tablet (5 mg total) by mouth 3 (three) times daily as needed for muscle spasms.  Dispense: 30 tablet; Refill: 1   -USPSTF grade A and B recommendations reviewed with patient; age-appropriate recommendations, preventive care, screening tests, etc discussed and encouraged; healthy living encouraged; see AVS for patient education given to patient -Discussed importance of 150 minutes of physical activity weekly, eat two servings of fish weekly, eat one serving of tree nuts ( cashews, pistachios, pecans, almonds.Marland Kitchen) every other day, eat 6 servings of fruit/vegetables daily and drink plenty of water and avoid sweet beverages.   -Reviewed Health  Maintenance: Yes.

## 2022-04-13 ENCOUNTER — Encounter: Payer: Managed Care, Other (non HMO) | Admitting: Family Medicine

## 2022-04-17 ENCOUNTER — Telehealth: Payer: Self-pay | Admitting: Internal Medicine

## 2022-04-17 NOTE — Telephone Encounter (Signed)
Erroneous encounter

## 2022-05-17 ENCOUNTER — Other Ambulatory Visit: Payer: Self-pay | Admitting: Family Medicine

## 2022-05-18 MED ORDER — LISINOPRIL 40 MG PO TABS: ORAL_TABLET | ORAL | 2 refills | Status: DC

## 2022-05-18 NOTE — Addendum Note (Signed)
Addended by: Tammi Sou on: 05/18/2022 04:42 PM   Modules accepted: Orders

## 2022-05-19 NOTE — Telephone Encounter (Signed)
Received a refill error that refill failed. It appears that patient has a different PCP. Please advise.

## 2022-05-20 ENCOUNTER — Telehealth: Payer: Self-pay | Admitting: Internal Medicine

## 2022-05-20 NOTE — Telephone Encounter (Signed)
Transmission failed, prescriber not in this practice, please forward to Loura Pardon, MD Requested Prescriptions  Pending Prescriptions Disp Refills  . lisinopril (ZESTRIL) 40 MG tablet [Pharmacy Med Name: LISINOPRIL '40MG'$  TABLETS] 90 tablet 2    Sig: TAKE 1 TABLET(40 MG) BY MOUTH DAILY     There is no refill protocol information for this order

## 2022-05-26 ENCOUNTER — Ambulatory Visit: Payer: Managed Care, Other (non HMO)

## 2022-05-28 ENCOUNTER — Ambulatory Visit (INDEPENDENT_AMBULATORY_CARE_PROVIDER_SITE_OTHER): Payer: Managed Care, Other (non HMO)

## 2022-05-28 DIAGNOSIS — Z23 Encounter for immunization: Secondary | ICD-10-CM | POA: Diagnosis not present

## 2022-06-04 ENCOUNTER — Telehealth: Payer: Self-pay | Admitting: Internal Medicine

## 2022-06-04 NOTE — Telephone Encounter (Signed)
Initial order from 05/18/2022 transmission to pharmacy failed  Medication Refill - Medication: lisinopril (ZESTRIL) 40 MG tablet   Has the patient contacted their pharmacy? Yes.   (Agent: If no, request that the patient contact the pharmacy for the refill. If patient does not wish to contact the pharmacy document the reason why and proceed with request.) (Agent: If yes, when and what did the pharmacy advise?)  Preferred Pharmacy (with phone number or street name):  [facility] [place], [address]  [place] [address]  Phone: [phone] Fax: [phone]   Has the patient been seen for an appointment in the last year OR does the patient have an upcoming appointment? Yes.    Agent: Please be advised that RX refills may take up to 3 business days. We ask that you follow-up with your pharmacy.

## 2022-06-04 NOTE — Telephone Encounter (Signed)
Requested Prescriptions  Refused Prescriptions Disp Refills  . lisinopril (ZESTRIL) 40 MG tablet 90 tablet 2    Sig: TAKE 1 TABLET(40 MG) BY MOUTH DAILY     Cardiovascular:  ACE Inhibitors Failed - 06/04/2022 12:14 PM      Failed - Cr in normal range and within 180 days    Creatinine, Ser  Date Value Ref Range Status  01/23/2022 1.03 (H) 0.57 - 1.00 mg/dL Final         Passed - K in normal range and within 180 days    Potassium  Date Value Ref Range Status  01/23/2022 3.9 3.5 - 5.2 mmol/L Final         Passed - Patient is not pregnant      Passed - Last BP in normal range    BP Readings from Last 1 Encounters:  04/10/22 128/82         Passed - Valid encounter within last 6 months    Recent Outpatient Visits          1 month ago Well adult exam   [place], DO   3 months ago Essential hypertension   [place], DO   4 months ago Essential hypertension   [place], DO      Future Appointments            In 2 months Teodora Medici, [facility], [facility]

## 2022-06-05 ENCOUNTER — Other Ambulatory Visit: Payer: Self-pay

## 2022-06-05 MED ORDER — LISINOPRIL 40 MG PO TABS: ORAL_TABLET | ORAL | 2 refills | Status: DC

## 2022-06-05 NOTE — Telephone Encounter (Signed)
Rx failed on 9/25 does not look like it went through? Can u send in for her?

## 2022-06-05 NOTE — Telephone Encounter (Signed)
Pt called in to follow up on refill request for Lisinopril. Advised pt of current status.   Please assist pt further.

## 2022-06-12 ENCOUNTER — Other Ambulatory Visit: Payer: Self-pay | Admitting: Family Medicine

## 2022-06-12 DIAGNOSIS — K219 Gastro-esophageal reflux disease without esophagitis: Secondary | ICD-10-CM

## 2022-06-30 ENCOUNTER — Ambulatory Visit (INDEPENDENT_AMBULATORY_CARE_PROVIDER_SITE_OTHER): Payer: Managed Care, Other (non HMO)

## 2022-06-30 ENCOUNTER — Ambulatory Visit: Payer: Managed Care, Other (non HMO)

## 2022-06-30 DIAGNOSIS — Z23 Encounter for immunization: Secondary | ICD-10-CM | POA: Diagnosis not present

## 2022-07-01 ENCOUNTER — Other Ambulatory Visit: Payer: Self-pay | Admitting: Family Medicine

## 2022-07-01 NOTE — Telephone Encounter (Unsigned)
Copied from [place] [id number]. Topic: General - Other >> Jul 01, 2022  4:19 PM Oley Balm E wrote: Reason for CRM: Pt called reporting that she has been out of her current supply for over a week.  [place], [address] AT [place] [address] Phone: [phone] Fax: [phone]

## 2022-07-02 ENCOUNTER — Other Ambulatory Visit: Payer: Self-pay

## 2022-07-02 MED ORDER — LISINOPRIL 40 MG PO TABS: ORAL_TABLET | ORAL | 2 refills | Status: DC

## 2022-07-02 MED ORDER — ATORVASTATIN CALCIUM 10 MG PO TABS: 10.0000 mg | ORAL_TABLET | Freq: Every day | ORAL | 2 refills | Status: DC

## 2022-07-02 MED ORDER — ATORVASTATIN CALCIUM 10 MG PO TABS: 10.0000 mg | ORAL_TABLET | Freq: Every day | ORAL | 0 refills | Status: DC

## 2022-07-08 ENCOUNTER — Ambulatory Visit: Payer: Self-pay

## 2022-07-08 NOTE — Telephone Encounter (Signed)
Message from [employer] sent at 07/08/2022  9:27 AM EST  Summary: stomach discomfort / behavioral health concern   The patient has experienced nausea and vomiting related to their stress  The patient would like to speak with a member of clinical staff prior to scheduling an appt  The patient shares that they vomited 8 times consecutively on 06/28/22  Please contact the patient further when possible        Called pt and LM on VM to call back to discuss sx.

## 2022-07-08 NOTE — Telephone Encounter (Signed)
  Chief Complaint: nausea Symptoms: nausea and vomiting Frequency: 1 month, vomiting on 06/28/22 x 8 times Pertinent Negatives: NA Disposition: '[]'$ ED /'[]'$ Urgent Care (no appt availability in office) / '[x]'$ Appointment(In office/virtual)/ '[]'$  [facility]/ '[]'$ Home Care/ '[]'$ Refused Recommended Disposition /'[]'$ [facility]/ '[]'$  Follow-up with PCP Additional Notes: pt states that she wants to make appt and come in for labs and see what could be causing ongoing issue. Has increased stress but didn't think that would cause symptoms. Scheduled appt for tomorrow at 1400 with PCP.   Summary: stomach discomfort / behavioral health concern     The patient has experienced nausea and vomiting related to their stress  The patient would like to speak with a member of clinical staff prior to scheduling an appt  The patient shares that they vomited 8 times consecutively on 06/28/22     Reason for Disposition  Nausea lasts > 1 week  Answer Assessment - Initial Assessment Questions 1. NAUSEA SEVERITY: "How bad is the nausea?" (e.g., mild, moderate, severe; dehydration, weight loss)   - MILD: loss of appetite without change in eating habits   - MODERATE: decreased oral intake without significant weight loss, dehydration, or malnutrition   - SEVERE: inadequate caloric or fluid intake, significant weight loss, symptoms of dehydration     Mild to moderate  2. ONSET: "When did the nausea begin?"     1 month  3. VOMITING: "Any vomiting?" If Yes, ask: "How many times today?"     06/28/22  Protocols used: [employer]

## 2022-07-09 ENCOUNTER — Encounter: Payer: Self-pay | Admitting: Internal Medicine

## 2022-07-09 ENCOUNTER — Ambulatory Visit (INDEPENDENT_AMBULATORY_CARE_PROVIDER_SITE_OTHER): Payer: Managed Care, Other (non HMO) | Admitting: Internal Medicine

## 2022-07-09 VITALS — BP 124/82 | HR 74 | Temp 98.3°F | Resp 16 | Ht 64.0 in | Wt 183.3 lb

## 2022-07-09 DIAGNOSIS — R1013 Epigastric pain: Secondary | ICD-10-CM | POA: Diagnosis not present

## 2022-07-09 DIAGNOSIS — R112 Nausea with vomiting, unspecified: Secondary | ICD-10-CM

## 2022-07-09 MED ORDER — ONDANSETRON HCL 4 MG PO TABS: 4.0000 mg | ORAL_TABLET | Freq: Three times a day (TID) | ORAL | 0 refills | Status: DC | PRN

## 2022-07-09 MED ORDER — PANTOPRAZOLE SODIUM 40 MG PO TBEC: 40.0000 mg | DELAYED_RELEASE_TABLET | Freq: Every day | ORAL | 3 refills | Status: DC

## 2022-07-09 NOTE — Progress Notes (Signed)
Acute Office Visit  Subjective:     Patient ID: Rachael Russell, female    DOB: 10/29/1965, 56 y.o.   MRN: [id number]  Chief Complaint  Patient presents with   Nausea    Vomiting and some diarrhea over the last month, no other symptoms    HPI Patient is in today for nausea.  Symptoms first started about 2 months ago after she ate seafood and felt nauseous and had an episode of vomiting.  She thought she had food poisoning, until her symptoms returned a few weeks later after eating seafood again.  On November 5 she had a family reunion and again felt like she had food poisoning with acute nausea and vomiting.  At that time she vomited up to 8 times a copious amount.  She denied bloody or bilious vomit.  She states since that time she has been nauseous nearly constantly.  She has not vomited since that time and denies abdominal pain.  She denies fevers, changes in bowel movements with the exception of 2 episodes of diarrhea and denies urinary symptoms.  Her appetite has changed but her weight is increased compared to her last visit here.  She can only tolerate small sips of water, ginger ale and ginger tea.  She is also only eating soups and bland foods.  She has not tried any medications for her symptoms.   Review of Systems  Constitutional:  Negative for chills and fever.  Respiratory:  Negative for shortness of breath.   Cardiovascular:  Negative for chest pain.  Gastrointestinal:  Positive for nausea and vomiting. Negative for abdominal pain, blood in stool, constipation, diarrhea and melena.  Genitourinary:  Negative for dysuria, flank pain, frequency, hematuria and urgency.  Neurological:  Negative for dizziness.        Objective:    BP 124/82   Pulse 74   Temp 98.3 F (36.8 C)   Resp 16   Ht '5\' 4"'$  (1.626 m)   Wt 183 lb 4.8 oz (83.1 kg)   LMP 04/25/2019   SpO2 99%   BMI 31.46 kg/m  BP Readings from Last 3 Encounters:  07/09/22 124/82  04/10/22 128/82  02/19/22 122/80    Wt Readings from Last 3 Encounters:  07/09/22 183 lb 4.8 oz (83.1 kg)  04/10/22 176 lb (79.8 kg)  02/19/22 180 lb 11.2 oz (82 kg)      Physical Exam Constitutional:      Appearance: Normal appearance.  HENT:     Head: Normocephalic and atraumatic.  Eyes:     Conjunctiva/sclera: Conjunctivae normal.  Cardiovascular:     Rate and Rhythm: Normal rate and regular rhythm.  Pulmonary:     Effort: Pulmonary effort is normal.     Breath sounds: Normal breath sounds.  Abdominal:     General: Bowel sounds are normal. There is no distension.     Palpations: Abdomen is soft. There is no mass.     Tenderness: There is abdominal tenderness. There is no right CVA tenderness, left CVA tenderness, guarding or rebound.     Hernia: No hernia is present.     Comments: Tenderness to epigastric and LUQ to palpation with exacerbation of severe nausea symptoms  Musculoskeletal:     Right lower leg: No edema.     Left lower leg: No edema.  Skin:    General: Skin is warm and dry.  Neurological:     General: No focal deficit present.     Mental Status: She  is alert. Mental status is at baseline.  Psychiatric:        Mood and Affect: Mood normal.        Behavior: Behavior normal.     No results found for any visits on 07/09/22.      Assessment & Plan:   1. Nausea and vomiting, unspecified vomiting type/Epigastric pain: Patient has been severely nauseous for weeks with on and off episodes of severe vomiting.  She is only able to tolerate a small amount of food and liquids before feeling symptoms.  We will obtain CBC, CMP and lipase today, as she does have pain in the epigastric and left upper quadrant.  Symptoms could be due to severe acid reflux as well, discontinue famotidine and start pantoprazole 40 mg daily.  I will also send Zofran to her pharmacy to take as needed for nausea.  Follow-up in 1 month to recheck, may require CT scan if symptoms continue.  - COMPLETE METABOLIC PANEL WITH  GFR - pantoprazole (PROTONIX) 40 MG tablet; Take 1 tablet (40 mg total) by mouth daily.  Dispense: 30 tablet; Refill: 3 - ondansetron (ZOFRAN) 4 MG tablet; Take 1 tablet (4 mg total) by mouth every 8 (eight) hours as needed for nausea or vomiting.  Dispense: 20 tablet; Refill: 0 - CBC w/Diff/Platelet - Lipase   Return in about 4 weeks (around 08/06/2022).  Teodora Medici, DO

## 2022-07-10 LAB — CBC WITH DIFFERENTIAL/PLATELET
Basophils Absolute: 0 10*3/uL (ref 0.0–0.2)
Basos: 0 %
EOS (ABSOLUTE): 0.1 10*3/uL (ref 0.0–0.4)
Eos: 2 %
Hematocrit: 40 % (ref 34.0–46.6)
Hemoglobin: 13.2 g/dL (ref 11.1–15.9)
Immature Grans (Abs): 0 10*3/uL (ref 0.0–0.1)
Immature Granulocytes: 0 %
Lymphocytes Absolute: 3.2 10*3/uL — ABNORMAL HIGH (ref 0.7–3.1)
Lymphs: 34 %
MCH: 29.9 pg (ref 26.6–33.0)
MCHC: 33 g/dL (ref 31.5–35.7)
MCV: 91 fL (ref 79–97)
Monocytes Absolute: 0.8 10*3/uL (ref 0.1–0.9)
Monocytes: 9 %
Neutrophils Absolute: 5.1 10*3/uL (ref 1.4–7.0)
Neutrophils: 55 %
Platelets: 323 10*3/uL (ref 150–450)
RBC: 4.42 x10E6/uL (ref 3.77–5.28)
RDW: 11.9 % (ref 11.7–15.4)
WBC: 9.3 10*3/uL (ref 3.4–10.8)

## 2022-07-10 LAB — COMPREHENSIVE METABOLIC PANEL
ALT: 30 IU/L (ref 0–32)
AST: 28 IU/L (ref 0–40)
Albumin/Globulin Ratio: 1.8 (ref 1.2–2.2)
Albumin: 4.3 g/dL (ref 3.8–4.9)
Alkaline Phosphatase: 95 IU/L (ref 44–121)
BUN/Creatinine Ratio: 17 (ref 9–23)
BUN: 17 mg/dL (ref 6–24)
Bilirubin Total: 0.4 mg/dL (ref 0.0–1.2)
CO2: 27 mmol/L (ref 20–29)
Calcium: 9.7 mg/dL (ref 8.7–10.2)
Chloride: 104 mmol/L (ref 96–106)
Creatinine, Ser: 0.98 mg/dL (ref 0.57–1.00)
Globulin, Total: 2.4 g/dL (ref 1.5–4.5)
Glucose: 86 mg/dL (ref 70–99)
Potassium: 3.8 mmol/L (ref 3.5–5.2)
Sodium: 142 mmol/L (ref 134–144)
Total Protein: 6.7 g/dL (ref 6.0–8.5)
eGFR: 68 mL/min/{1.73_m2} (ref >59)

## 2022-07-10 LAB — LIPASE: Lipase: 35 U/L (ref 14–72)

## 2022-07-21 ENCOUNTER — Other Ambulatory Visit: Payer: Self-pay

## 2022-07-21 MED ORDER — AMLODIPINE BESYLATE 5 MG PO TABS: 2.5000 mg | ORAL_TABLET | Freq: Every day | ORAL | 1 refills | Status: DC

## 2022-08-03 ENCOUNTER — Other Ambulatory Visit: Payer: Self-pay | Admitting: Internal Medicine

## 2022-08-03 NOTE — Telephone Encounter (Signed)
Copied from [place] [id number]. Topic: General - Other >> Aug 03, 2022  9:15 AM Everette C wrote: Reason for CRM: Medication Refill - Medication: hydrochlorothiazide (HYDRODIURIL) 25 MG tablet [[id number]] - patient has 0 tablets remaining   Has the patient contacted their pharmacy? Yes.  The patient has spoken with their other pharmacy and been directed to request a refill at their local pharmacy  (Agent: If no, request that the patient contact the pharmacy for the refill. If patient does not wish to contact the pharmacy document the reason why and proceed with request.) (Agent: If yes, when and what did the pharmacy advise?)  Preferred Pharmacy (with phone number or street name): [facility], [place] AT [address] [place] [address] Phone: [phone] Fax: [phone] Hours: Not open 24 hours   Has the patient been seen for an appointment in the last year OR does the patient have an upcoming appointment? Yes.    Agent: Please be advised that RX refills may take up to 3 business days. We ask that you follow-up with your pharmacy.

## 2022-08-04 ENCOUNTER — Other Ambulatory Visit: Payer: Self-pay

## 2022-08-04 MED ORDER — HYDROCHLOROTHIAZIDE 25 MG PO TABS: 25.0000 mg | ORAL_TABLET | Freq: Every day | ORAL | 3 refills | Status: DC

## 2022-08-04 NOTE — Telephone Encounter (Signed)
Pt has appt on 08-06-2022

## 2022-08-04 NOTE — Telephone Encounter (Signed)
Requested medication (s) are due for refill today: routing for review  Requested medication (s) are on the active medication list: yes  Last refill:  04/02/21  Future visit scheduled: yes  Notes to clinic:  Unable to refill per protocol, last refill by another provider. Routing for review     Requested Prescriptions  Pending Prescriptions Disp Refills   hydrochlorothiazide (HYDRODIURIL) 25 MG tablet 90 tablet 3    Sig: Take 1 tablet (25 mg total) by mouth daily.     Cardiovascular: Diuretics - Thiazide Passed - 08/03/2022 10:07 AM      Passed - Cr in normal range and within 180 days    Creatinine, Ser  Date Value Ref Range Status  07/09/2022 0.98 0.57 - 1.00 mg/dL Final         Passed - K in normal range and within 180 days    Potassium  Date Value Ref Range Status  07/09/2022 3.8 3.5 - 5.2 mmol/L Final         Passed - Na in normal range and within 180 days    Sodium  Date Value Ref Range Status  07/09/2022 142 134 - 144 mmol/L Final         Passed - Last BP in normal range    BP Readings from Last 1 Encounters:  07/09/22 124/82         Passed - Valid encounter within last 6 months    Recent Outpatient Visits           3 weeks ago Nausea and vomiting, unspecified vomiting type   [place], DO   3 months ago Well adult exam   [place], DO   5 months ago Essential hypertension   [place], DO   6 months ago Essential hypertension   [place], DO       Future Appointments             In 2 days Teodora Medici, [facility], Train[facility]   In 6 days Teodora Medici, [facility], [facility]

## 2022-08-06 ENCOUNTER — Ambulatory Visit: Payer: Managed Care, Other (non HMO) | Admitting: Internal Medicine

## 2022-08-06 NOTE — Progress Notes (Deleted)
Acute Office Visit  Subjective:     Patient ID: Rachael Russell, female    DOB: 09/17/1965, 56 y.o.   MRN: [id number]  No chief complaint on file.   HPI Patient is in today for follow up on nausea.  Symptoms first started about 2 months ago after she ate seafood and felt nauseous and had an episode of vomiting.  She thought she had food poisoning, until her symptoms returned a few weeks later after eating seafood again.  On November 5 she had a family reunion and again felt like she had food poisoning with acute nausea and vomiting.  At that time she vomited up to 8 times a copious amount.  She denied bloody or bilious vomit.  She states since that time she has been nauseous nearly constantly.  She has not vomited since that time and denies abdominal pain.  She denies fevers, changes in bowel movements with the exception of 2 episodes of diarrhea and denies urinary symptoms.  Her appetite has changed but her weight is increased compared to her last visit here.  She can only tolerate small sips of water, ginger ale and ginger tea.  She is also only eating soups and bland foods.  She has not tried any medications for her symptoms. At our [facility] she was started on Protonix 40 mg and given Zofran to use PRN. Labs were unremarkable.    Review of Systems  Constitutional:  Negative for chills and fever.  Respiratory:  Negative for shortness of breath.   Cardiovascular:  Negative for chest pain.  Gastrointestinal:  Positive for nausea and vomiting. Negative for abdominal pain, blood in stool, constipation, diarrhea and melena.  Genitourinary:  Negative for dysuria, flank pain, frequency, hematuria and urgency.  Neurological:  Negative for dizziness.        Objective:    LMP 04/25/2019  BP Readings from Last 3 Encounters:  07/09/22 124/82  04/10/22 128/82  02/19/22 122/80   Wt Readings from Last 3 Encounters:  07/09/22 183 lb 4.8 oz (83.1 kg)  04/10/22 176 lb (79.8 kg)  02/19/22 180 lb 11.2  oz (82 kg)      Physical Exam Constitutional:      Appearance: Normal appearance.  HENT:     Head: Normocephalic and atraumatic.  Eyes:     Conjunctiva/sclera: Conjunctivae normal.  Cardiovascular:     Rate and Rhythm: Normal rate and regular rhythm.  Pulmonary:     Effort: Pulmonary effort is normal.     Breath sounds: Normal breath sounds.  Abdominal:     General: Bowel sounds are normal. There is no distension.     Palpations: Abdomen is soft. There is no mass.     Tenderness: There is abdominal tenderness. There is no right CVA tenderness, left CVA tenderness, guarding or rebound.     Hernia: No hernia is present.     Comments: Tenderness to epigastric and LUQ to palpation with exacerbation of severe nausea symptoms  Musculoskeletal:     Right lower leg: No edema.     Left lower leg: No edema.  Skin:    General: Skin is warm and dry.  Neurological:     General: No focal deficit present.     Mental Status: She is alert. Mental status is at baseline.  Psychiatric:        Mood and Affect: Mood normal.        Behavior: Behavior normal.     No results found for any  visits on 08/06/22.      Assessment & Plan:   1. Nausea and vomiting, unspecified vomiting type/Epigastric pain: Patient has been severely nauseous for weeks with on and off episodes of severe vomiting.  She is only able to tolerate a small amount of food and liquids before feeling symptoms.  We will obtain CBC, CMP and lipase today, as she does have pain in the epigastric and left upper quadrant.  Symptoms could be due to severe acid reflux as well, discontinue famotidine and start pantoprazole 40 mg daily.  I will also send Zofran to her pharmacy to take as needed for nausea.  Follow-up in 1 month to recheck, may require CT scan if symptoms continue.  - COMPLETE METABOLIC PANEL WITH GFR - pantoprazole (PROTONIX) 40 MG tablet; Take 1 tablet (40 mg total) by mouth daily.  Dispense: 30 tablet; Refill: 3 -  ondansetron (ZOFRAN) 4 MG tablet; Take 1 tablet (4 mg total) by mouth every 8 (eight) hours as needed for nausea or vomiting.  Dispense: 20 tablet; Refill: 0 - CBC w/Diff/Platelet - Lipase   No follow-ups on file.  Teodora Medici, DO

## 2022-08-09 NOTE — Progress Notes (Deleted)
Acute Office Visit  Subjective:     Patient ID: Rachael Russell, female    DOB: 09-20-1965, 56 y.o.   MRN: [id number]  No chief complaint on file.   HPI Patient is in today for follow up on nausea.  Symptoms first started about 2 months ago after she ate seafood and felt nauseous and had an episode of vomiting.  She thought she had food poisoning, until her symptoms returned a few weeks later after eating seafood again.  On November 5 she had a family reunion and again felt like she had food poisoning with acute nausea and vomiting.  At that time she vomited up to 8 times a copious amount.  She denied bloody or bilious vomit.  She states since that time she has been nauseous nearly constantly.  She has not vomited since that time and denies abdominal pain.  She denies fevers, changes in bowel movements with the exception of 2 episodes of diarrhea and denies urinary symptoms.  Her appetite has changed but her weight is increased compared to her last visit here.  She can only tolerate small sips of water, ginger ale and ginger tea.  She is also only eating soups and bland foods.  She has not tried any medications for her symptoms. At our [facility] she was started on Protonix 40 mg and given Zofran to use PRN. Labs were unremarkable.    Review of Systems  Constitutional:  Negative for chills and fever.  Respiratory:  Negative for shortness of breath.   Cardiovascular:  Negative for chest pain.  Gastrointestinal:  Positive for nausea and vomiting. Negative for abdominal pain, blood in stool, constipation, diarrhea and melena.  Genitourinary:  Negative for dysuria, flank pain, frequency, hematuria and urgency.  Neurological:  Negative for dizziness.        Objective:    LMP 04/25/2019  BP Readings from Last 3 Encounters:  07/09/22 124/82  04/10/22 128/82  02/19/22 122/80   Wt Readings from Last 3 Encounters:  07/09/22 183 lb 4.8 oz (83.1 kg)  04/10/22 176 lb (79.8 kg)  02/19/22 180 lb 11.2  oz (82 kg)      Physical Exam Constitutional:      Appearance: Normal appearance.  HENT:     Head: Normocephalic and atraumatic.  Eyes:     Conjunctiva/sclera: Conjunctivae normal.  Cardiovascular:     Rate and Rhythm: Normal rate and regular rhythm.  Pulmonary:     Effort: Pulmonary effort is normal.     Breath sounds: Normal breath sounds.  Abdominal:     General: Bowel sounds are normal. There is no distension.     Palpations: Abdomen is soft. There is no mass.     Tenderness: There is abdominal tenderness. There is no right CVA tenderness, left CVA tenderness, guarding or rebound.     Hernia: No hernia is present.     Comments: Tenderness to epigastric and LUQ to palpation with exacerbation of severe nausea symptoms  Musculoskeletal:     Right lower leg: No edema.     Left lower leg: No edema.  Skin:    General: Skin is warm and dry.  Neurological:     General: No focal deficit present.     Mental Status: She is alert. Mental status is at baseline.  Psychiatric:        Mood and Affect: Mood normal.        Behavior: Behavior normal.     No results found for any  visits on 08/10/22.      Assessment & Plan:   1. Nausea and vomiting, unspecified vomiting type/Epigastric pain: Patient has been severely nauseous for weeks with on and off episodes of severe vomiting.  She is only able to tolerate a small amount of food and liquids before feeling symptoms.  We will obtain CBC, CMP and lipase today, as she does have pain in the epigastric and left upper quadrant.  Symptoms could be due to severe acid reflux as well, discontinue famotidine and start pantoprazole 40 mg daily.  I will also send Zofran to her pharmacy to take as needed for nausea.  Follow-up in 1 month to recheck, may require CT scan if symptoms continue.  - COMPLETE METABOLIC PANEL WITH GFR - pantoprazole (PROTONIX) 40 MG tablet; Take 1 tablet (40 mg total) by mouth daily.  Dispense: 30 tablet; Refill: 3 -  ondansetron (ZOFRAN) 4 MG tablet; Take 1 tablet (4 mg total) by mouth every 8 (eight) hours as needed for nausea or vomiting.  Dispense: 20 tablet; Refill: 0 - CBC w/Diff/Platelet - Lipase   No follow-ups on file.  Teodora Medici, DO

## 2022-08-10 ENCOUNTER — Ambulatory Visit: Payer: Managed Care, Other (non HMO) | Admitting: Internal Medicine

## 2022-08-11 NOTE — Progress Notes (Signed)
Acute Office Visit  Subjective:     Patient ID: Rachael Russell, female    DOB: 10-21-1965, 56 y.o.   MRN: [id number]  No chief complaint on file.   HPI Patient is in today for follow up on nausea.  Symptoms first started about 2 months ago after she ate seafood and felt nauseous and had an episode of vomiting.  She thought she had food poisoning, until her symptoms returned a few weeks later after eating seafood again.  On November 5 she had a family reunion and again felt like she had food poisoning with acute nausea and vomiting.  At that time she vomited up to 8 times a copious amount.  She denied bloody or bilious vomit.  She states since that time she has been nauseous nearly constantly.  She has not vomited since that time and denies abdominal pain.  She denies fevers, changes in bowel movements with the exception of 2 episodes of diarrhea and denies urinary symptoms.  Her appetite has changed but her weight is increased compared to her last visit here.  She can only tolerate small sips of water, ginger ale and ginger tea.  She is also only eating soups and bland foods.  She has not tried any medications for her symptoms. At our [facility] she was started on Protonix 40 mg and given Zofran to use PRN. Labs were unremarkable.    Review of Systems  Constitutional:  Negative for chills and fever.  Respiratory:  Negative for shortness of breath.   Cardiovascular:  Negative for chest pain.  Gastrointestinal:  Positive for nausea and vomiting. Negative for abdominal pain, blood in stool, constipation, diarrhea and melena.  Genitourinary:  Negative for dysuria, flank pain, frequency, hematuria and urgency.  Neurological:  Negative for dizziness.        Objective:    LMP 04/25/2019  BP Readings from Last 3 Encounters:  07/09/22 124/82  04/10/22 128/82  02/19/22 122/80   Wt Readings from Last 3 Encounters:  07/09/22 183 lb 4.8 oz (83.1 kg)  04/10/22 176 lb (79.8 kg)  02/19/22 180 lb 11.2  oz (82 kg)      Physical Exam Constitutional:      Appearance: Normal appearance.  HENT:     Head: Normocephalic and atraumatic.  Eyes:     Conjunctiva/sclera: Conjunctivae normal.  Cardiovascular:     Rate and Rhythm: Normal rate and regular rhythm.  Pulmonary:     Effort: Pulmonary effort is normal.     Breath sounds: Normal breath sounds.  Abdominal:     General: Bowel sounds are normal. There is no distension.     Palpations: Abdomen is soft. There is no mass.     Tenderness: There is abdominal tenderness. There is no right CVA tenderness, left CVA tenderness, guarding or rebound.     Hernia: No hernia is present.     Comments: Tenderness to epigastric and LUQ to palpation with exacerbation of severe nausea symptoms  Musculoskeletal:     Right lower leg: No edema.     Left lower leg: No edema.  Skin:    General: Skin is warm and dry.  Neurological:     General: No focal deficit present.     Mental Status: She is alert. Mental status is at baseline.  Psychiatric:        Mood and Affect: Mood normal.        Behavior: Behavior normal.     No results found for any  visits on 08/12/22.      Assessment & Plan:   1. Nausea and vomiting, unspecified vomiting type/Epigastric pain: Patient has been severely nauseous for weeks with on and off episodes of severe vomiting.  She is only able to tolerate a small amount of food and liquids before feeling symptoms.  We will obtain CBC, CMP and lipase today, as she does have pain in the epigastric and left upper quadrant.  Symptoms could be due to severe acid reflux as well, discontinue famotidine and start pantoprazole 40 mg daily.  I will also send Zofran to her pharmacy to take as needed for nausea.  Follow-up in 1 month to recheck, may require CT scan if symptoms continue.  - COMPLETE METABOLIC PANEL WITH GFR - pantoprazole (PROTONIX) 40 MG tablet; Take 1 tablet (40 mg total) by mouth daily.  Dispense: 30 tablet; Refill: 3 -  ondansetron (ZOFRAN) 4 MG tablet; Take 1 tablet (4 mg total) by mouth every 8 (eight) hours as needed for nausea or vomiting.  Dispense: 20 tablet; Refill: 0 - CBC w/Diff/Platelet - Lipase   No follow-ups on file.  Teodora Medici, DO

## 2022-08-12 ENCOUNTER — Ambulatory Visit: Payer: Managed Care, Other (non HMO) | Admitting: Internal Medicine

## 2022-08-12 ENCOUNTER — Encounter: Payer: Self-pay | Admitting: Internal Medicine

## 2022-08-12 VITALS — BP 118/84 | HR 83 | Temp 97.8°F | Resp 18 | Ht 64.0 in | Wt 185.0 lb

## 2022-08-12 DIAGNOSIS — K219 Gastro-esophageal reflux disease without esophagitis: Secondary | ICD-10-CM

## 2022-08-12 DIAGNOSIS — E7800 Pure hypercholesterolemia, unspecified: Secondary | ICD-10-CM | POA: Diagnosis not present

## 2022-08-12 DIAGNOSIS — I10 Essential (primary) hypertension: Secondary | ICD-10-CM | POA: Diagnosis not present

## 2022-08-12 DIAGNOSIS — R112 Nausea with vomiting, unspecified: Secondary | ICD-10-CM

## 2022-08-12 MED ORDER — FAMOTIDINE 20 MG PO TABS: 20.0000 mg | ORAL_TABLET | Freq: Every day | ORAL | 1 refills | Status: DC

## 2022-08-12 MED ORDER — ATORVASTATIN CALCIUM 10 MG PO TABS: 10.0000 mg | ORAL_TABLET | Freq: Every day | ORAL | 3 refills | Status: DC

## 2022-08-12 MED ORDER — HYDROCHLOROTHIAZIDE 25 MG PO TABS: 25.0000 mg | ORAL_TABLET | Freq: Every day | ORAL | 3 refills | Status: DC

## 2022-08-12 MED ORDER — LISINOPRIL 40 MG PO TABS: ORAL_TABLET | ORAL | 3 refills | Status: DC

## 2022-08-12 MED ORDER — AMLODIPINE BESYLATE 5 MG PO TABS: 2.5000 mg | ORAL_TABLET | Freq: Every day | ORAL | 1 refills | Status: DC

## 2022-08-12 MED ORDER — ONDANSETRON HCL 4 MG PO TABS: 4.0000 mg | ORAL_TABLET | Freq: Three times a day (TID) | ORAL | 0 refills | Status: DC | PRN

## 2022-08-12 NOTE — Patient Instructions (Addendum)
It was great seeing you today!  Plan discussed at today's visit: -Medications refilled -Stop soy based product for at least 2 weeks to see if this helps with nausea. Zofran sent to pharmacy -Referral to GI placed   Follow up in: 6 months   Take care and let us know if you have any questions or concerns prior to your next visit.  Dr. Rosana Berger

## 2022-08-18 LAB — HM MAMMOGRAPHY

## 2022-08-22 ENCOUNTER — Encounter: Payer: Self-pay | Admitting: Internal Medicine

## 2022-08-25 ENCOUNTER — Other Ambulatory Visit: Payer: Self-pay | Admitting: Internal Medicine

## 2022-08-27 ENCOUNTER — Telehealth: Payer: Managed Care, Other (non HMO) | Admitting: Gastroenterology

## 2022-08-27 ENCOUNTER — Other Ambulatory Visit: Payer: Self-pay

## 2022-08-27 ENCOUNTER — Encounter: Payer: Self-pay | Admitting: Gastroenterology

## 2022-08-27 DIAGNOSIS — R112 Nausea with vomiting, unspecified: Secondary | ICD-10-CM

## 2022-08-27 NOTE — Progress Notes (Signed)
Sherri Sear, MD [address]  [place]  [place], [place] [address]  Main: [phone]  Fax: [phone]    Gastroenterology Consultation Video Visit  Referring Provider:     Teodora Medici, DO Primary Care Physician:  Teodora Medici, DO Primary Gastroenterologist:  Dr. Cephas Darby Reason for Consultation: Chronic nausea and vomiting        HPI:   Rachael Russell is a 57 y.o. female referred by Dr. Teodora Medici, DO  for consultation & management of chronic nausea and vomiting  Virtual Visit Video Note  I connected with Rachael Russell on 08/27/22 at  3:30 PM EST by video and verified that I am speaking with the correct person using two identifiers.   I discussed the limitations, risks, security and privacy concerns of performing an evaluation and management service by video and the availability of in person appointments. I also discussed with the patient that there may be a patient responsible charge related to this service. The patient expressed understanding and agreed to proceed.  Location of the Patient: Home  Location of the provider: Home office  Persons participating in the visit: Patient and provider only   History of Present Illness: Rachael Russell is a 57 years old female with history of hypertension, hyperlipidemia is seen in consultation for chronic intermittent episodes of nausea and vomiting.  The symptoms started in July, then in August she had 2 episodes followed by in October and then in December.  She is having more frequently lately.  She tried Protonix as suggested by her PCP which resulted in headaches and she stopped.  She is currently on Pepcid.  She does report some abdominal bloating during these episodes.  She denies any abdominal pain.  The episodes are sporadic with no particular relation to the type of food.  She reports throwing up undigested material.  She denies any weight loss or loss of appetite.  She has been drinking 2 cans of  ginger ale daily along with herbal tea.  She denies any problems with moving her bowels.  Her labs including CBC, CMP, serum lipase are unremarkable.  She does not smoke or drink alcohol.  Denies any marijuana use  NSAIDs: None  Antiplts/Anticoagulants/Anti thrombotics: None  GI Procedures:  Colonoscopy in 2018 for colon cancer screening - The perianal and digital rectal examinations were normal. - A few small-mouthed diverticula were found in the sigmoid colon and descending colon. - Non-bleeding internal hemorrhoids were found during retroflexion. The hemorrhoids were small. - The exam was otherwise without abnormality.  Past Medical History:  Diagnosis Date   Anemia    Back pain    Bulging lumbar disc    Family history of adverse reaction to anesthesia    mother has difficult intubation   Family history of breast cancer    Family history of prostate cancer    Fibroadenoma of breast, left    Fibrocystic breast, right    GERD (gastroesophageal reflux disease)    Heartburn    Hyperlipidemia    Hypertension    Irritable bowel syndrome (IBS)    Knee pain    Lactose intolerance    Menopause    Menorrhagia    Obesity    Papilloma of left breast 07/09/2017   SOBOE (shortness of breath on exertion)    Swelling of both lower extremities    Vitamin D deficiency    Wears glasses     Past Surgical History:  Procedure Laterality Date  BREAST DUCTAL SYSTEM EXCISION Left 11/18/2012   Procedure: EXCISION DUCTAL SYSTEM BREAST;  Surgeon: [name], MD;  Location: [facility];  Service: General;  Laterality: Left;   BREAST LUMPECTOMY WITH NEEDLE LOCALIZATION Left 11/18/2012   Procedure: excise ductal system left breast. subarealar. left partial mastectomy with radiographic guidance;  Surgeon: [name], MD;  Location: [facility];  Service: General;  Laterality: Left;  excise ductal system left breast. subarealar. left partial mastectomy  with radiographic guidance   BREAST LUMPECTOMY WITH NEEDLE LOCALIZATION Left 03/27/2015   Procedure: LEFT BREAST LUMPECTOMY  WITH NEEDLE LOCALIZATION TIMES TWO;  Surgeon: Fanny Skates, MD;  Location: [facility];  Service: General;  Laterality: Left;   BREAST LUMPECTOMY WITH RADIOACTIVE SEED LOCALIZATION Left 07/09/2017   Procedure: LEFT BREAST LUMPECTOMY WITH RADIOACTIVE SEED LOCALIZATION;  Surgeon: Fanny Skates, MD;  Location: [facility];  Service: General;  Laterality: Left;  ERAS PATHWAY   BUNIONECTOMY Right    CESAREAN SECTION  0258,5277   DILATION AND CURETTAGE OF UTERUS  1998   DILITATION & CURRETTAGE/HYSTROSCOPY WITH NOVASURE ABLATION N/A 08/05/2016   Procedure: DILATATION & CURETTAGE/HYSTEROSCOPY WITH NOVASURE ABLATION;  Surgeon: Princess Bruins, MD;  Location: [facility];  Service: Gynecology;  Laterality: N/A;   TONSILLECTOMY  age 37   UMBILICAL HERNIA REPAIR  2006     Current Outpatient Medications:    amLODipine (NORVASC) 5 MG tablet, Take 0.5 tablets (2.5 mg total) by mouth daily., Disp: 90 tablet, Rfl: 1   atorvastatin (LIPITOR) 10 MG tablet, Take 1 tablet (10 mg total) by mouth daily., Disp: 90 tablet, Rfl: 3   Cholecalciferol 2000 units TABS, Take 1 tablet by mouth daily., Disp: , Rfl:    cyclobenzaprine (FLEXERIL) 5 MG tablet, Take 1 tablet (5 mg total) by mouth 3 (three) times daily as needed for muscle spasms., Disp: 30 tablet, Rfl: 1   famotidine (PEPCID) 20 MG tablet, Take 1 tablet (20 mg total) by mouth daily., Disp: 180 tablet, Rfl: 1   hydrochlorothiazide (HYDRODIURIL) 25 MG tablet, Take 1 tablet (25 mg total) by mouth daily., Disp: 90 tablet, Rfl: 3   lisinopril (ZESTRIL) 40 MG tablet, TAKE 1 TABLET(40 MG) BY MOUTH DAILY, Disp: 90 tablet, Rfl: 3   Multiple Vitamin (MULTIVITAMIN) capsule, Take 1 capsule by mouth daily., Disp: , Rfl:    ondansetron (ZOFRAN) 4 MG tablet, Take 1 tablet (4 mg total) by mouth every 8 (eight) hours as needed  for nausea or vomiting., Disp: 20 tablet, Rfl: 0   triamcinolone cream (KENALOG) 0.1 %, Apply 1 application. topically 2 (two) times daily., Disp: 30 g, Rfl: 0   Family History  Problem Relation Age of Onset   Hypertension Mother    Arthritis Mother        osteoarthritis   Breast cancer Mother 21   Hyperlipidemia Mother    Cancer Mother    Obesity Mother    Hypertension Father    Arthritis Father        osteoarthritis   Hyperlipidemia Father    Breast cancer Maternal Grandmother 36       bilateral breast cancer   Breast cancer Paternal Aunt 68       bilateral breast cancer   Prostate cancer Paternal Uncle    Tuberculosis Paternal Grandmother    Prostate cancer Paternal Uncle      Social History   Tobacco Use   Smoking status: Never   Smokeless tobacco: Never  Vaping Use  Vaping Use: Never used  Substance Use Topics   Alcohol use: Not Currently   Drug use: No    Allergies as of 08/27/2022 - Review Complete 08/12/2022  Allergen Reaction Noted   Ferrous sulfate  05/11/2011   Pantoprazole sodium  08/12/2022   Penicillins Other (See Comments) 04/22/2007   Potassium-containing compounds Other (See Comments) 08/12/2022   Prilosec [omeprazole magnesium]  05/11/2011   Chlorhexidine gluconate Rash 08/05/2016     Imaging Studies: None  Assessment and Plan:   Rachael Russell is a 57 y.o. pleasant African-American female with history of obesity, BMI 31, hypertension, hyperlipidemia is seen in consultation for approximately 6 months history of intermittent episodes of nausea and vomiting associated with some abdominal bloating, but no abdominal pain  Recommend EGD with gastric and duodenal biopsies If EGD is unremarkable, recommend right upper quadrant ultrasound, +/- HIDA scan, gastric emptying study Continue Zofran as needed for nausea Continue Pepcid for now   Follow Up Instructions:   I discussed the assessment and treatment plan with the patient. The patient was  provided an opportunity to ask questions and all were answered. The patient agreed with the plan and demonstrated an understanding of the instructions.   The patient was advised to call back or seek an in-person evaluation if the symptoms worsen or if the condition fails to improve as anticipated.  I provided 25 minutes of face-to-face time during this encounter.   Follow up based on the above workup   Cephas Darby, MD

## 2022-09-02 ENCOUNTER — Encounter: Payer: Self-pay | Admitting: Gastroenterology

## 2022-09-03 ENCOUNTER — Other Ambulatory Visit
Admission: RE | Admit: 2022-09-03 | Discharge: 2022-09-03 | Disposition: A | Discharge status: Home / Self Care | Payer: Managed Care, Other (non HMO) | Attending: Urgent Care | Admitting: Urgent Care

## 2022-09-03 DIAGNOSIS — Z79899 Other long term (current) drug therapy: Secondary | ICD-10-CM | POA: Diagnosis not present

## 2022-09-03 DIAGNOSIS — Z01812 Encounter for preprocedural laboratory examination: Secondary | ICD-10-CM | POA: Diagnosis not present

## 2022-09-03 LAB — POTASSIUM: Potassium: 3.4 mmol/L — ABNORMAL LOW (ref 3.5–5.1)

## 2022-09-10 ENCOUNTER — Ambulatory Visit
Admission: RE | Admit: 2022-09-10 | Discharge: 2022-09-10 | Disposition: A | Discharge status: Home / Self Care | Payer: Managed Care, Other (non HMO) | Attending: Gastroenterology | Admitting: Gastroenterology

## 2022-09-10 ENCOUNTER — Ambulatory Visit: Payer: Managed Care, Other (non HMO) | Admitting: Anesthesiology

## 2022-09-10 ENCOUNTER — Other Ambulatory Visit: Payer: Self-pay

## 2022-09-10 ENCOUNTER — Encounter: Payer: Self-pay | Admitting: Gastroenterology

## 2022-09-10 ENCOUNTER — Encounter
Admission: RE | Disposition: A | Discharge status: Home / Self Care | Payer: Self-pay | Source: Home / Self Care | Attending: Gastroenterology

## 2022-09-10 DIAGNOSIS — Z6831 Body mass index (BMI) 31.0-31.9, adult: Secondary | ICD-10-CM | POA: Insufficient documentation

## 2022-09-10 DIAGNOSIS — E785 Hyperlipidemia, unspecified: Secondary | ICD-10-CM | POA: Insufficient documentation

## 2022-09-10 DIAGNOSIS — E559 Vitamin D deficiency, unspecified: Secondary | ICD-10-CM | POA: Insufficient documentation

## 2022-09-10 DIAGNOSIS — Z01812 Encounter for preprocedural laboratory examination: Secondary | ICD-10-CM

## 2022-09-10 DIAGNOSIS — R112 Nausea with vomiting, unspecified: Secondary | ICD-10-CM | POA: Diagnosis not present

## 2022-09-10 DIAGNOSIS — Z79899 Other long term (current) drug therapy: Secondary | ICD-10-CM

## 2022-09-10 DIAGNOSIS — I10 Essential (primary) hypertension: Secondary | ICD-10-CM | POA: Insufficient documentation

## 2022-09-10 DIAGNOSIS — K2980 Duodenitis without bleeding: Secondary | ICD-10-CM | POA: Insufficient documentation

## 2022-09-10 DIAGNOSIS — E669 Obesity, unspecified: Secondary | ICD-10-CM | POA: Insufficient documentation

## 2022-09-10 DIAGNOSIS — K219 Gastro-esophageal reflux disease without esophagitis: Secondary | ICD-10-CM | POA: Insufficient documentation

## 2022-09-10 DIAGNOSIS — K3189 Other diseases of stomach and duodenum: Secondary | ICD-10-CM | POA: Diagnosis not present

## 2022-09-10 HISTORY — PX: ESOPHAGOGASTRODUODENOSCOPY (EGD) WITH PROPOFOL: SHX5813

## 2022-09-10 SURGERY — ESOPHAGOGASTRODUODENOSCOPY (EGD) WITH PROPOFOL
Anesthesia: General | Site: Mouth

## 2022-09-10 MED ORDER — LACTATED RINGERS IV SOLN: INTRAVENOUS | Status: DC | PRN

## 2022-09-10 MED ORDER — SODIUM CHLORIDE 0.9 % IV SOLN: INTRAVENOUS | Status: DC

## 2022-09-10 MED ORDER — GLYCOPYRROLATE 0.2 MG/ML IJ SOLN
INTRAMUSCULAR | Status: DC | PRN
  Administered 2022-09-10: .2 mg via INTRAVENOUS

## 2022-09-10 MED ORDER — PROPOFOL 10 MG/ML IV BOLUS
INTRAVENOUS | Status: DC | PRN
  Administered 2022-09-10: 50 mg via INTRAVENOUS
  Administered 2022-09-10: 100 mg via INTRAVENOUS
  Administered 2022-09-10 (×2): 50 mg via INTRAVENOUS

## 2022-09-10 MED ORDER — LIDOCAINE HCL (CARDIAC) PF 100 MG/5ML IV SOSY
PREFILLED_SYRINGE | INTRAVENOUS | Status: DC | PRN
  Administered 2022-09-10: 40 mg via INTRAVENOUS

## 2022-09-10 SURGICAL SUPPLY — 32 items
BALLN DILATOR 12-15 8 (BALLOONS)
BALLN DILATOR 15-18 8 (BALLOONS)
BALLN DILATOR CRE 0-12 8 (BALLOONS)
BALLN DILATOR ESOPH 8 10 CRE (MISCELLANEOUS) IMPLANT
BALLOON DILATOR 12-15 8 (BALLOONS) IMPLANT
BALLOON DILATOR 15-18 8 (BALLOONS) IMPLANT
BALLOON DILATOR CRE 0-12 8 (BALLOONS) IMPLANT
BLOCK BITE 60FR ADLT L/F GRN (MISCELLANEOUS) ×2 IMPLANT
CLIP HMST 235XBRD CATH ROT (MISCELLANEOUS) IMPLANT
CLIP RESOLUTION 360 11X235 (MISCELLANEOUS)
ELECT REM PT RETURN 9FT ADLT (ELECTROSURGICAL)
ELECTRODE REM PT RTRN 9FT ADLT (ELECTROSURGICAL) IMPLANT
FCP ESCP3.2XJMB 240X2.8X (MISCELLANEOUS)
FORCEPS BIOP RAD 4 LRG CAP 4 (CUTTING FORCEPS) IMPLANT
FORCEPS BIOP RJ4 240 W/NDL (MISCELLANEOUS)
FORCEPS ESCP3.2XJMB 240X2.8X (MISCELLANEOUS) IMPLANT
GOWN CVR UNV OPN BCK APRN NK (MISCELLANEOUS) ×4 IMPLANT
GOWN ISOL THUMB LOOP REG UNIV (MISCELLANEOUS) ×2
INJECTOR VARIJECT VIN23 (MISCELLANEOUS) IMPLANT
KIT DEFENDO VALVE AND CONN (KITS) IMPLANT
KIT PRC NS LF DISP ENDO (KITS) ×2 IMPLANT
KIT PROCEDURE OLYMPUS (KITS) ×1
MANIFOLD NEPTUNE II (INSTRUMENTS) ×2 IMPLANT
MARKER SPOT ENDO TATTOO 5ML (MISCELLANEOUS) IMPLANT
RETRIEVER NET PLAT FOOD (MISCELLANEOUS) IMPLANT
SNARE SHORT THROW 13M SML OVAL (MISCELLANEOUS) IMPLANT
SNARE SHORT THROW 30M LRG OVAL (MISCELLANEOUS) IMPLANT
SYR INFLATION 60ML (SYRINGE) IMPLANT
TRAP ETRAP POLY (MISCELLANEOUS) IMPLANT
VARIJECT INJECTOR VIN23 (MISCELLANEOUS)
WATER STERILE IRR 250ML POUR (IV SOLUTION) ×2 IMPLANT
WIRE CRE 18-20MM 8CM F G (MISCELLANEOUS) IMPLANT

## 2022-09-10 NOTE — H&P (Signed)
Cephas Darby, MD [address]  [place]  [place], [place] [address]  Main: [phone]  Fax: [phone] Pager: [phone]  Primary Care Physician:  Teodora Medici, DO Primary Gastroenterologist:  Dr. Cephas Darby  Pre-Procedure History & Physical: HPI:  Rachael Russell is a 57 y.o. female is here for an endoscopy.   Past Medical History:  Diagnosis Date   Anemia    Back pain    Bulging lumbar disc    Family history of adverse reaction to anesthesia    mother has difficult intubation   Family history of breast cancer    Family history of prostate cancer    Fibroadenoma of breast, left    Fibrocystic breast, right    GERD (gastroesophageal reflux disease)    Heartburn    Hyperlipidemia    Hypertension    Irritable bowel syndrome (IBS)    Knee pain    Lactose intolerance    Menopause    Menorrhagia    Obesity    Papilloma of left breast 07/09/2017   SOBOE (shortness of breath on exertion)    Swelling of both lower extremities    Vitamin D deficiency    Wears glasses     Past Surgical History:  Procedure Laterality Date   BREAST DUCTAL SYSTEM EXCISION Left 11/18/2012   Procedure: EXCISION DUCTAL SYSTEM BREAST;  Surgeon: [name], MD;  Location: [facility];  Service: General;  Laterality: Left;   BREAST LUMPECTOMY WITH NEEDLE LOCALIZATION Left 11/18/2012   Procedure: excise ductal system left breast. subarealar. left partial mastectomy with radiographic guidance;  Surgeon: [name], MD;  Location: [facility];  Service: General;  Laterality: Left;  excise ductal system left breast. subarealar. left partial mastectomy with radiographic guidance   BREAST LUMPECTOMY WITH NEEDLE LOCALIZATION Left 03/27/2015   Procedure: LEFT BREAST LUMPECTOMY  WITH NEEDLE LOCALIZATION TIMES TWO;  Surgeon: Fanny Skates, MD;  Location: [facility];  Service: General;  Laterality: Left;   BREAST LUMPECTOMY WITH RADIOACTIVE SEED  LOCALIZATION Left 07/09/2017   Procedure: LEFT BREAST LUMPECTOMY WITH RADIOACTIVE SEED LOCALIZATION;  Surgeon: Fanny Skates, MD;  Location: [facility];  Service: General;  Laterality: Left;  ERAS PATHWAY   BUNIONECTOMY Right    CESAREAN SECTION  [id number]   DILATION AND CURETTAGE OF UTERUS  1998   DILITATION & CURRETTAGE/HYSTROSCOPY WITH NOVASURE ABLATION N/A 08/05/2016   Procedure: DILATATION & CURETTAGE/HYSTEROSCOPY WITH NOVASURE ABLATION;  Surgeon: Princess Bruins, MD;  Location: [facility];  Service: Gynecology;  Laterality: N/A;   TONSILLECTOMY  age 45   UMBILICAL HERNIA REPAIR  2006    Prior to Admission medications   Medication Sig Start Date End Date Taking? Authorizing Provider  amLODipine (NORVASC) 5 MG tablet Take 0.5 tablets (2.5 mg total) by mouth daily. 08/12/22  Yes Teodora Medici, DO  atorvastatin (LIPITOR) 10 MG tablet Take 1 tablet (10 mg total) by mouth daily. 08/12/22  Yes Teodora Medici, DO  Cholecalciferol 2000 units TABS Take 1 tablet by mouth daily.   Yes [provider]  cyclobenzaprine (FLEXERIL) 5 MG tablet Take 1 tablet (5 mg total) by mouth 3 (three) times daily as needed for muscle spasms. 04/10/22  Yes Teodora Medici, DO  famotidine (PEPCID) 20 MG tablet Take 1 tablet (20 mg total) by mouth daily. 08/12/22  Yes Teodora Medici, DO  hydrochlorothiazide (HYDRODIURIL) 25 MG tablet Take 1 tablet (25 mg total) by mouth daily. 08/12/22  Yes Teodora Medici,  DO  lisinopril (ZESTRIL) 40 MG tablet TAKE 1 TABLET(40 MG) BY MOUTH DAILY 08/12/22  Yes Teodora Medici, DO  Multiple Vitamin (MULTIVITAMIN) capsule Take 1 capsule by mouth daily.   Yes [provider]  ondansetron (ZOFRAN) 4 MG tablet Take 1 tablet (4 mg total) by mouth every 8 (eight) hours as needed for nausea or vomiting. 08/12/22  Yes Teodora Medici, DO  triamcinolone cream (KENALOG) 0.1 % Apply 1 application. topically 2 (two) times  daily. Patient not taking: Reported on 09/02/2022 01/21/22   Teodora Medici, DO    Allergies as of 08/27/2022 - Review Complete 08/12/2022  Allergen Reaction Noted   Ferrous sulfate  05/11/2011   Pantoprazole sodium  08/12/2022   Penicillins Other (See Comments) 04/22/2007   Potassium-containing compounds Other (See Comments) 08/12/2022   Prilosec [omeprazole magnesium]  05/11/2011   Chlorhexidine gluconate Rash 08/05/2016    Family History  Problem Relation Age of Onset   Hypertension Mother    Arthritis Mother        osteoarthritis   Breast cancer Mother 62   Hyperlipidemia Mother    Cancer Mother    Obesity Mother    Hypertension Father    Arthritis Father        osteoarthritis   Hyperlipidemia Father    Breast cancer Maternal Grandmother 59       bilateral breast cancer   Breast cancer Paternal Aunt 38       bilateral breast cancer   Prostate cancer Paternal Uncle    Tuberculosis Paternal Grandmother    Prostate cancer Paternal Uncle     Social History   Socioeconomic History   Marital status: Married    Spouse name: Bailey Faiella   Number of children: 2   Years of education: Not on file   Highest education level: Not on file  Occupational History   Occupation: Geophysical data processor 2    Comment: Water quality scientist- foster care  Tobacco Use   Smoking status: Never   Smokeless tobacco: Never  Vaping Use   Vaping Use: Never used  Substance and Sexual Activity   Alcohol use: Not Currently   Drug use: No   Sexual activity: Yes    Birth control/protection: Other-see comments, Surgical    Comment: vasectomy  Other Topics Concern   Not on file  Social History Narrative   Not on file   Social Determinants of Health   Financial Resource Strain: Low Risk  (04/10/2022)   Overall Financial Resource Strain (CARDIA)    Difficulty of Paying Living Expenses: Not hard at all  Food Insecurity: No Food Insecurity (04/10/2022)   Hunger Vital Sign    Worried  About Running Out of Food in the Last Year: Never true    Ran Out of Food in the Last Year: Never true  Transportation Needs: No Transportation Needs (04/10/2022)   PRAPARE - Hydrologist (Medical): No    Lack of Transportation (Non-Medical): No  Physical Activity: Sufficiently Active (04/10/2022)   Exercise Vital Sign    Days of Exercise per Week: 4 days    Minutes of Exercise per Session: 40 min  Stress: No Stress Concern Present (04/10/2022)   [place]    Feeling of Stress : Not at all  Social Connections: [place] (04/10/2022)   Social Connection and Isolation Panel [NHANES]    Frequency of Communication with Friends and Family: More than three times a week  Frequency of Social Gatherings with Friends and Family: Three times a week    Attends Religious Services: More than 4 times per year    Active Member of Clubs or Organizations: Yes    Attends Archivist Meetings: 1 to 4 times per year    Marital Status: Married  Human resources officer Violence: Not At Risk (04/10/2022)   Humiliation, Afraid, Rape, and Kick questionnaire    Fear of Current or Ex-Partner: No    Emotionally Abused: No    Physically Abused: No    Sexually Abused: No    Review of Systems: See HPI, otherwise negative ROS  Physical Exam: BP (!) 132/91   Temp 98.4 F (36.9 C) (Tympanic)   Resp 12   Ht 5' 4.02" (1.626 m)   Wt 82.1 kg   LMP 04/25/2019   SpO2 97%   BMI 31.05 kg/m  General:   Alert,  pleasant and cooperative in NAD Head:  Normocephalic and atraumatic. Neck:  Supple; no masses or thyromegaly. Lungs:  Clear throughout to auscultation.    Heart:  Regular rate and rhythm. Abdomen:  Soft, nontender and nondistended. Normal bowel sounds, without guarding, and without rebound.   Neurologic:  Alert and  oriented x4;  grossly normal neurologically.  Impression/Plan: Rachael Russell is here  for an endoscopy to be performed for 6 months history of intermittent episodes of nausea and vomiting associated with some abdominal bloating, but no abdominal pain   Risks, benefits, limitations, and alternatives regarding  endoscopy have been reviewed with the patient.  Questions have been answered.  All parties agreeable.   Sherri Sear, MD  09/10/2022, 9:09 AM

## 2022-09-10 NOTE — Op Note (Signed)
[facility] Gastroenterology Patient Name: Rachael Russell Procedure Date: 09/10/2022 10:34 AM MRN: [id number] Account #: [id number] Date of Birth: 08-13-1966 Admit Type: Outpatient Age: 57 Room: [facility] OR ROOM 01 Gender: Female Note Status: Finalized Instrument Name: [id number] Procedure:             Upper GI endoscopy Indications:           Nausea with vomiting Providers:             Lin Landsman MD, MD Referring MD:          DO Teodora Medici Medicines:             General Anesthesia Complications:         No immediate complications. Estimated blood loss: None. Procedure:             Pre-Anesthesia Assessment:                        - Prior to the procedure, a History and Physical was                         performed, and patient medications and allergies were                         reviewed. The patient is competent. The risks and                         benefits of the procedure and the sedation options and                         risks were discussed with the patient. All questions                         were answered and informed consent was obtained.                         Patient identification and proposed procedure were                         verified by the physician, the nurse, the                         anesthesiologist, the anesthetist and the technician                         in the pre-procedure area in the procedure room in the                         endoscopy suite. Mental Status Examination: alert and                         oriented. Airway Examination: normal oropharyngeal                         airway and neck mobility. Respiratory Examination:                         clear to auscultation. CV Examination: normal.  Prophylactic Antibiotics: The patient does not require                         prophylactic antibiotics. Prior Anticoagulants: The                         patient has taken no anticoagulant or  antiplatelet                         agents. ASA Grade Assessment: II - A patient with mild                         systemic disease. After reviewing the risks and                         benefits, the patient was deemed in satisfactory                         condition to undergo the procedure. The anesthesia                         plan was to use general anesthesia. Immediately prior                         to administration of medications, the patient was                         re-assessed for adequacy to receive sedatives. The                         heart rate, respiratory rate, oxygen saturations,                         blood pressure, adequacy of pulmonary ventilation, and                         response to care were monitored throughout the                         procedure. The physical status of the patient was                         re-assessed after the procedure.                        After obtaining informed consent, the endoscope was                         passed under direct vision. Throughout the procedure,                         the patient's blood pressure, pulse, and oxygen                         saturations were monitored continuously. The Endoscope                         was introduced through the mouth, and advanced to the  second part of duodenum. The upper GI endoscopy was                         accomplished without difficulty. The patient tolerated                         the procedure well. Findings:      The examined duodenum was normal. Biopsies were taken with a cold       forceps for histology.      The entire examined stomach was normal. Biopsies were taken with a cold       forceps for Helicobacter pylori testing.      The cardia and gastric fundus were normal on retroflexion.      The gastroesophageal junction and examined esophagus were normal. Impression:            - Normal examined duodenum. Biopsied.                         - Normal stomach. Biopsied.                        - Normal gastroesophageal junction and esophagus. Recommendation:        - Await pathology results.                        - Discharge patient to home (with spouse).                        - Resume previous diet today.                        - Continue present medications. Procedure Code(s):     --- Professional ---                        [phone], Esophagogastroduodenoscopy, flexible,                         transoral; with biopsy, single or multiple Diagnosis Code(s):     --- Professional ---                        R11.2, Nausea with vomiting, unspecified CPT copyright 2022 American Medical Association. All rights reserved. The codes documented in this report are preliminary and upon coder review may  be revised to meet current compliance requirements. Dr. Ulyess Mort Lin Landsman MD, MD 09/10/2022 10:56:56 AM This report has been signed electronically. Number of Addenda: 0 Note Initiated On: 09/10/2022 10:34 AM Total Procedure Duration: 0 hours 5 minutes 46 seconds  Estimated Blood Loss:  Estimated blood loss: none.      [facility]

## 2022-09-10 NOTE — Anesthesia Preprocedure Evaluation (Signed)
Anesthesia Evaluation  Patient identified by MRN, date of birth, ID band Patient awake    Reviewed: Allergy & Precautions, NPO status , Patient's Chart, lab work & pertinent test results  History of Anesthesia Complications Negative for: history of anesthetic complications  Airway Mallampati: II  TM Distance: >3 FB Neck ROM: Full    Dental  (+) Teeth Intact   Pulmonary neg pulmonary ROS, neg sleep apnea, neg COPD, Patient abstained from smoking.Not current smoker   Pulmonary exam normal breath sounds clear to auscultation       Cardiovascular Exercise Tolerance: Good METShypertension, (-) CAD and (-) Past MI (-) dysrhythmias  Rhythm:Regular Rate:Normal - Systolic murmurs    Neuro/Psych  PSYCHIATRIC DISORDERS  Depression    negative neurological ROS     GI/Hepatic ,GERD  Medicated,,(+)     (-) substance abuse  Random nausea and vomiting over past 6 months. None since end of December. Feels well today   Endo/Other  neg diabetes    Renal/GU negative Renal ROS     Musculoskeletal   Abdominal   Peds  Hematology   Anesthesia Other Findings Past Medical History: No date: Anemia No date: Back pain No date: Bulging lumbar disc No date: Family history of adverse reaction to anesthesia     Comment:  mother has difficult intubation No date: Family history of breast cancer No date: Family history of prostate cancer No date: Fibroadenoma of breast, left No date: Fibrocystic breast, right No date: GERD (gastroesophageal reflux disease) No date: Heartburn No date: Hyperlipidemia No date: Hypertension No date: Irritable bowel syndrome (IBS) No date: Knee pain No date: Lactose intolerance No date: Menopause No date: Menorrhagia No date: Obesity 07/09/2017: Papilloma of left breast No date: SOBOE (shortness of breath on exertion) No date: Swelling of both lower extremities No date: Vitamin D deficiency No date:  Wears glasses  Reproductive/Obstetrics                              Anesthesia Physical Anesthesia Plan  ASA: 2  Anesthesia Plan: General   Post-op Pain Management: Minimal or no pain anticipated   Induction: Intravenous  PONV Risk Score and Plan: 3 and Propofol infusion, TIVA and Ondansetron  Airway Management Planned: Nasal Cannula  Additional Equipment: None  Intra-op Plan:   Post-operative Plan:   Informed Consent: I have reviewed the patients History and Physical, chart, labs and discussed the procedure including the risks, benefits and alternatives for the proposed anesthesia with the patient or authorized representative who has indicated his/her understanding and acceptance.     Dental advisory given  Plan Discussed with: CRNA and Surgeon  Anesthesia Plan Comments: (Discussed risks of anesthesia with patient, including possibility of difficulty with spontaneous ventilation under anesthesia necessitating airway intervention, PONV, and rare risks such as cardiac or respiratory or neurological events, and allergic reactions. Discussed the role of CRNA in patient's perioperative care. Patient understands.)         Anesthesia Quick Evaluation

## 2022-09-10 NOTE — Anesthesia Postprocedure Evaluation (Signed)
Anesthesia Post Note  Patient: Rachael Russell  Procedure(s) Performed: ESOPHAGOGASTRODUODENOSCOPY (EGD) WITH PROPOFOL (Mouth)  Patient location during evaluation: Endoscopy Anesthesia Type: General Level of consciousness: awake and alert Pain management: pain level controlled Vital Signs Assessment: post-procedure vital signs reviewed and stable Respiratory status: spontaneous breathing, nonlabored ventilation, respiratory function stable and patient connected to nasal cannula oxygen Cardiovascular status: blood pressure returned to baseline and stable Postop Assessment: no apparent nausea or vomiting Anesthetic complications: no   No notable events documented.   Last Vitals:  Vitals:   09/10/22 1100 09/10/22 1109  BP: (!) 126/98   Pulse: 93   Resp: 18   Temp: (!) 36.4 C (!) 36.4 C  SpO2: 96%     Last Pain:  Vitals:   09/10/22 0857  TempSrc: Tympanic  PainSc: 0-No pain                 Arita Miss

## 2022-09-10 NOTE — Transfer of Care (Signed)
Immediate Anesthesia Transfer of Care Note  Patient: Rachael Russell  Procedure(s) Performed: ESOPHAGOGASTRODUODENOSCOPY (EGD) WITH PROPOFOL (Mouth)  Patient Location: PACU  Anesthesia Type: General  Level of Consciousness: awake, alert  and patient cooperative  Airway and Oxygen Therapy: Patient Spontanous Breathing and Patient connected to supplemental oxygen  Post-op Assessment: Post-op Vital signs reviewed, Patient's Cardiovascular Status Stable, Respiratory Function Stable, Patent Airway and No signs of Nausea or vomiting  Post-op Vital Signs: Reviewed and stable  Complications: No notable events documented.

## 2022-09-11 ENCOUNTER — Encounter: Payer: Self-pay | Admitting: Gastroenterology

## 2022-09-14 LAB — SURGICAL PATHOLOGY

## 2022-09-17 ENCOUNTER — Telehealth: Payer: Self-pay

## 2022-09-17 DIAGNOSIS — R112 Nausea with vomiting, unspecified: Secondary | ICD-10-CM

## 2022-09-17 NOTE — Telephone Encounter (Signed)
Called and left a message for call back  

## 2022-09-17 NOTE — Telephone Encounter (Signed)
-----  Message from Lin Landsman, MD sent at 09/17/2022 11:55 AM EST ----- Please inform patient that the pathology results from upper endoscopy came back normal.  As discussed during office visit, next step would be right upper quadrant ultrasound and gastric emptying study for chronic nausea and vomiting if patient is agreeable  Rohini Vanga

## 2022-09-17 NOTE — Telephone Encounter (Signed)
Got patient schedule for 10/02/2022 arrive at 8:30am for 9:00am Nothing to eat or drink after midnight.  No Pepcid on Zofran 8 hours before. Called and left a message for call back

## 2022-09-18 NOTE — Telephone Encounter (Signed)
Called and left a detail message. Sent mychart message also

## 2022-10-02 ENCOUNTER — Encounter
Admission: RE | Admit: 2022-10-02 | Discharge: 2022-10-02 | Disposition: A | Discharge status: Home / Self Care | Payer: Managed Care, Other (non HMO) | Source: Ambulatory Visit | Attending: Gastroenterology

## 2022-10-02 ENCOUNTER — Ambulatory Visit
Admission: RE | Admit: 2022-10-02 | Discharge: 2022-10-02 | Disposition: A | Discharge status: Home / Self Care | Payer: Managed Care, Other (non HMO) | Source: Ambulatory Visit | Attending: Gastroenterology

## 2022-10-02 DIAGNOSIS — R112 Nausea with vomiting, unspecified: Secondary | ICD-10-CM | POA: Insufficient documentation

## 2022-10-02 MED ORDER — TECHNETIUM TC 99M SULFUR COLLOID
2.0800 | Freq: Once | INTRAVENOUS | Status: AC | PRN
  Administered 2022-10-02: 2.08 via ORAL

## 2022-10-05 ENCOUNTER — Telehealth: Payer: Self-pay

## 2022-10-05 DIAGNOSIS — K824 Cholesterolosis of gallbladder: Secondary | ICD-10-CM

## 2022-10-05 NOTE — Telephone Encounter (Signed)
Called and patient verbalized understanding  of results. She states she will be on vacation from 10/17/22 to 10/26/2022.  Place referral to general surgery and order Hida scan. Called and got patient scheduled for 10/14/2022 [facility] to medical mall at 8:30 for a 9:00am scan. Nothing to eat or drink after midnight. Patient was informed and will be there on 10/14/2022

## 2022-10-05 NOTE — Telephone Encounter (Signed)
Her renal function is normal based on her blood work results.  I do not think it has any clinical implications or to be worried at this time.  Forwarding the results and her concerns to her PCP   RV

## 2022-10-05 NOTE — Telephone Encounter (Signed)
Patient has a question about her ultrasound results. She wants to know what this means 3. Echogenic renal parenchyma suggestive of medical renal disease.

## 2022-10-05 NOTE — Telephone Encounter (Signed)
-----   Message from Lin Landsman, MD sent at 10/03/2022  4:21 PM EST ----- Please inform patient that the gastric emptying study came back normal.  Ultrasound of the liver showed very small multiple gallbladder polyps.  However, with her symptoms of nausea and vomiting, recommend HIDA scan and referral to general surgery  RV

## 2022-10-05 NOTE — Telephone Encounter (Signed)
Patient verbalized understanding of instructions  

## 2022-10-09 ENCOUNTER — Telehealth: Payer: Self-pay

## 2022-10-09 NOTE — Telephone Encounter (Signed)
Patient called for you to review all her imaging and to let her know if any cause for concern with these test.  She states she doesn't understand why they keep ordering more test?  I told her if she wants to find the cause of her nausea and vomiting you would want her to do what the specialist GI recommends because on our end there is nothing more you can do for her.

## 2022-10-14 ENCOUNTER — Ambulatory Visit
Admission: RE | Admit: 2022-10-14 | Discharge: 2022-10-14 | Disposition: A | Discharge status: Home / Self Care | Payer: Managed Care, Other (non HMO) | Source: Ambulatory Visit | Attending: Gastroenterology | Admitting: Gastroenterology

## 2022-10-14 DIAGNOSIS — R1011 Right upper quadrant pain: Secondary | ICD-10-CM | POA: Insufficient documentation

## 2022-10-14 DIAGNOSIS — K824 Cholesterolosis of gallbladder: Secondary | ICD-10-CM | POA: Insufficient documentation

## 2022-10-14 MED ORDER — TECHNETIUM TC 99M MEBROFENIN IV KIT
5.0000 | PACK | Freq: Once | INTRAVENOUS | Status: AC | PRN
  Administered 2022-10-14: 5.35 via INTRAVENOUS

## 2022-10-15 ENCOUNTER — Telehealth: Payer: Self-pay | Admitting: Gastroenterology

## 2022-10-15 ENCOUNTER — Ambulatory Visit: Payer: Managed Care, Other (non HMO) | Admitting: Surgery

## 2022-10-15 NOTE — Telephone Encounter (Signed)
Patient is still having vomiting and nausea. She states this just happens off and on. Tuesday was the first time it happen in a while. She states she has the nausea more often and takes Zofran as needed but tries to only take it if she really needs it because it constipates her. She had a appointment with general surgery today but they rescheduled her to 10/29/2022. She is going on a cruise and wants to know the results of the HIDA scan was and see what is further recommendations for her symptoms

## 2022-10-15 NOTE — Telephone Encounter (Signed)
Patient calling for results and next stps. States she is going on a cruise Saturday and would like to know before she goes.

## 2022-10-16 NOTE — Telephone Encounter (Signed)
Patient verbalized understanding of results. She will follow up with general surgery on 10/29/2022. She only takes Advil if she has a headache but it is rare. She states she will take tylenol now for headaches. She does not have any exposure to marijuana. She is taking the zofran a couple times a week

## 2022-10-16 NOTE — Telephone Encounter (Signed)
Dr Lance Morin patient  1. HIDA normal  2. Symptoms could be related to gall bladder  3. Check if on any nsaids as there was gastritis if yes then stop  4. Avoid any exposure to marijuana in case present  How often is she taking zofran

## 2022-10-29 ENCOUNTER — Encounter: Payer: Self-pay | Admitting: Surgery

## 2022-10-29 ENCOUNTER — Ambulatory Visit: Payer: Managed Care, Other (non HMO) | Admitting: Surgery

## 2022-10-29 VITALS — BP 129/90 | HR 76 | Ht 65.0 in | Wt 176.0 lb

## 2022-10-29 DIAGNOSIS — K219 Gastro-esophageal reflux disease without esophagitis: Secondary | ICD-10-CM

## 2022-10-29 DIAGNOSIS — R112 Nausea with vomiting, unspecified: Secondary | ICD-10-CM

## 2022-10-29 NOTE — Progress Notes (Signed)
Patient ID: Rachael Russell, female   DOB: 12/16/1965, 57 y.o.   MRN: [id number]  Chief Complaint: Nausea/vomiting.  History of Present Illness Rachael Russell is a 57 y.o. female with history that extends back to the summer 2022 where after a seafood meal she developed some severe nausea with violent vomiting.  This was a new experience, however it happened again in the subsequent months but with non- memorable meals.  She reports times when she would awaken at night feeling quite weak and ended up vomiting.  She is kept Tums and ginger ale available.  She has a known history of GERD, with an intolerance to PPIs, although she has not tried them all.  She does take Pepcid on a regular basis, and also calcium carbonate.  She has undergone workup by GI Dr. Marius Ditch, had a negative gastric emptying study, negative right upper quadrant ultrasound and a negative HIDA scan with a EF.  She reports that taking the Ensure for the HIDA scan did not elicit any symptoms at all.  She began avoiding meat this year, but had a steak on Valentine's Day which did not bother her at all.  She keep Zofran readily available. Of note the abdominal ultrasound did show multiple 5 mm/subclinical gallbladder polyps and the radiologist advised no further imaging follow-up needed.  Past Medical History Past Medical History:  Diagnosis Date   Anemia    Back pain    Bulging lumbar disc    Family history of adverse reaction to anesthesia    mother has difficult intubation   Family history of breast cancer    Family history of prostate cancer    Fibroadenoma of breast, left    Fibrocystic breast, right    GERD (gastroesophageal reflux disease)    Heartburn    Hyperlipidemia    Hypertension    Irritable bowel syndrome (IBS)    Knee pain    Lactose intolerance    Menopause    Menorrhagia    Obesity    Papilloma of left breast 07/09/2017   SOBOE (shortness of breath on exertion)    Swelling of both lower extremities     Vitamin D deficiency    Wears glasses       Past Surgical History:  Procedure Laterality Date   BREAST DUCTAL SYSTEM EXCISION Left 11/18/2012   Procedure: EXCISION DUCTAL SYSTEM BREAST;  Surgeon: [name], MD;  Location: [facility];  Service: General;  Laterality: Left;   BREAST LUMPECTOMY WITH NEEDLE LOCALIZATION Left 11/18/2012   Procedure: excise ductal system left breast. subarealar. left partial mastectomy with radiographic guidance;  Surgeon: [name], MD;  Location: [facility];  Service: General;  Laterality: Left;  excise ductal system left breast. subarealar. left partial mastectomy with radiographic guidance   BREAST LUMPECTOMY WITH NEEDLE LOCALIZATION Left 03/27/2015   Procedure: LEFT BREAST LUMPECTOMY  WITH NEEDLE LOCALIZATION TIMES TWO;  Surgeon: Fanny Skates, MD;  Location: [facility];  Service: General;  Laterality: Left;   BREAST LUMPECTOMY WITH RADIOACTIVE SEED LOCALIZATION Left 07/09/2017   Procedure: LEFT BREAST LUMPECTOMY WITH RADIOACTIVE SEED LOCALIZATION;  Surgeon: Fanny Skates, MD;  Location: [facility];  Service: General;  Laterality: Left;  ERAS PATHWAY   BUNIONECTOMY Right    CESAREAN SECTION  [id number]   DILATION AND CURETTAGE OF [place] N/A 08/05/2016   Procedure: DILATATION & CURETTAGE/HYSTEROSCOPY WITH NOVASURE ABLATION;  Surgeon: Princess Bruins,  MD;  Location: [facility];  Service: Gynecology;  Laterality: N/A;   ESOPHAGOGASTRODUODENOSCOPY (EGD) WITH PROPOFOL N/A 09/10/2022   Procedure: ESOPHAGOGASTRODUODENOSCOPY (EGD) WITH PROPOFOL;  Surgeon: Lin Landsman, MD;  Location: [facility];  Service: Endoscopy;  Laterality: N/A;   TONSILLECTOMY  age 9   UMBILICAL HERNIA REPAIR  2006    Allergies  Allergen Reactions   Ferrous Sulfate     constipation   Pantoprazole Sodium     Headache   Penicillins  Other (See Comments)    Unknown childhood reaction   Potassium-Containing Compounds Other (See Comments)    headaches   Prilosec [Omeprazole Magnesium]     headache   Chlorhexidine Gluconate Rash    Current Outpatient Medications  Medication Sig Dispense Refill   amLODipine (NORVASC) 5 MG tablet Take 0.5 tablets (2.5 mg total) by mouth daily. 90 tablet 1   atorvastatin (LIPITOR) 10 MG tablet Take 1 tablet (10 mg total) by mouth daily. 90 tablet 3   Cholecalciferol 2000 units TABS Take 1 tablet by mouth daily.     cyclobenzaprine (FLEXERIL) 5 MG tablet Take 1 tablet (5 mg total) by mouth 3 (three) times daily as needed for muscle spasms. 30 tablet 1   famotidine (PEPCID) 20 MG tablet Take 1 tablet (20 mg total) by mouth daily. 180 tablet 1   hydrochlorothiazide (HYDRODIURIL) 25 MG tablet Take 1 tablet (25 mg total) by mouth daily. 90 tablet 3   lisinopril (ZESTRIL) 40 MG tablet TAKE 1 TABLET(40 MG) BY MOUTH DAILY 90 tablet 3   Multiple Vitamin (MULTIVITAMIN) capsule Take 1 capsule by mouth daily.     ondansetron (ZOFRAN) 4 MG tablet Take 1 tablet (4 mg total) by mouth every 8 (eight) hours as needed for nausea or vomiting. 20 tablet 0   No current facility-administered medications for this visit.    Family History Family History  Problem Relation Age of Onset   Hypertension Mother    Arthritis Mother        osteoarthritis   Breast cancer Mother 28   Hyperlipidemia Mother    Cancer Mother    Obesity Mother    Hypertension Father    Arthritis Father        osteoarthritis   Hyperlipidemia Father    Breast cancer Maternal Grandmother 5       bilateral breast cancer   Breast cancer Paternal Aunt 74       bilateral breast cancer   Prostate cancer Paternal Uncle    Tuberculosis Paternal Grandmother    Prostate cancer Paternal Uncle       Social History Social History   Tobacco Use   Smoking status: Never   Smokeless tobacco: Never  Vaping Use   Vaping Use: Never used   Substance Use Topics   Alcohol use: Not Currently   Drug use: No        Review of Systems  Constitutional:  Positive for malaise/fatigue.  HENT: Negative.    Eyes: Negative.   Respiratory: Negative.    Cardiovascular:  Positive for leg swelling.  Gastrointestinal:  Positive for constipation, heartburn, nausea and vomiting.  Skin: Negative.      Physical Exam Blood pressure (!) 129/90, pulse 76, height '5\' 5"'$  (1.651 m), weight 176 lb (79.8 kg), last menstrual period 04/25/2019. Last Weight  Most recent update: 10/29/2022 10:15 AM    Weight  79.8 kg (176 lb)             CONSTITUTIONAL: Well  developed, and nourished, appropriately responsive and aware without distress.   EYES: Sclera non-icteric.   EARS, NOSE, MOUTH AND THROAT:  The oropharynx is clear. Oral mucosa is pink and moist.    Hearing is intact to voice.  NECK: Trachea is midline, and there is no jugular venous distension.  LYMPH NODES:  Lymph nodes in the neck are not appreciated. RESPIRATORY:  Normal respiratory effort without pathologic use of accessory muscles. CARDIOVASCULAR:  Well perfused.  GI: The abdomen is  soft, nontender, and nondistended. MUSCULOSKELETAL:  Symmetrical muscle tone appreciated in all four extremities.    SKIN: Skin turgor is normal. No pathologic skin lesions appreciated.  NEUROLOGIC:  Motor and sensation appear grossly normal.  Cranial nerves are grossly without defect. PSYCH:  Alert and oriented to person, place and time. Affect is appropriate for situation.  Data Reviewed I have personally reviewed what is currently available of the patient's imaging, recent labs and medical records.   Labs:     Latest Ref Rng & Units 07/09/2022    2:40 PM 01/23/2022   10:18 AM 04/04/2021   11:23 AM  CBC  WBC 3.4 - 10.8 x10E3/uL 9.3  7.3  7.8   Hemoglobin 11.1 - 15.9 g/dL 13.2  13.9  13.5   Hematocrit 34.0 - 46.6 % 40.0  41.3  40.6   Platelets 150 - 450 x10E3/uL 323  320  293       Latest Ref  Rng & Units 09/03/2022    3:10 PM 07/09/2022    2:40 PM 01/23/2022   10:18 AM  CMP  Glucose 70 - 99 mg/dL  86  94   BUN 6 - 24 mg/dL  17  18   Creatinine 0.57 - 1.00 mg/dL  0.98  1.03   Sodium 134 - 144 mmol/L  142  143   Potassium 3.5 - 5.1 mmol/L 3.4  3.8  3.9   Chloride 96 - 106 mmol/L  104  105   CO2 20 - 29 mmol/L  27  25   Calcium 8.7 - 10.2 mg/dL  9.7  8.3   Total Protein 6.0 - 8.5 g/dL  6.7  6.9   Total Bilirubin 0.0 - 1.2 mg/dL  0.4  0.5   Alkaline Phos 44 - 121 IU/L  95  91   AST 0 - 40 IU/L  28  23   ALT 0 - 32 IU/L  30  30      Imaging: Radiological images reviewed:  CLINICAL DATA:  Right upper quadrant abdominal pain concern for acalculous cholecystitis.   EXAM: NUCLEAR MEDICINE HEPATOBILIARY IMAGING WITH GALLBLADDER EF   TECHNIQUE: Sequential images of the abdomen were obtained out to 60 minutes following intravenous administration of radiopharmaceutical. After oral ingestion of Ensure, gallbladder ejection fraction was determined. At 60 min, normal ejection fraction is greater than 33%.   RADIOPHARMACEUTICALS:  5.4 mCi Tc-90m Choletec IV   COMPARISON:  Ultrasound October 02, 2022   FINDINGS: Prompt uptake and biliary excretion of activity by the liver is seen. Gallbladder activity is visualized, consistent with patency of cystic duct. Biliary activity passes into small bowel, consistent with patent common bile duct.   Calculated gallbladder ejection fraction is 60%. (Normal gallbladder ejection fraction with Ensure is greater than 33%.)   IMPRESSION: 1.  Patent cystic and common bile ducts.   2.  Normal gallbladder ejection fraction.     Electronically Signed   By: JDahlia BailiffM.D.   On: 10/14/2022 13:11 CLINICAL DATA:  Chronic nausea and vomiting   EXAM: ULTRASOUND ABDOMEN LIMITED RIGHT UPPER QUADRANT   COMPARISON:  None Available.   FINDINGS: Gallbladder:   Multiple polyps measuring up to 5 mm. No imaging follow-up needed. No  gallbladder wall thickening or pericholecystic fluid. Negative sonographic Murphy's sign.   Common bile duct:   Diameter: 3 mm   Liver:   No focal lesion identified. Within normal limits in parenchymal echogenicity. Portal vein is patent on color Doppler imaging with normal direction of blood flow towards the liver.   Other: Echogenic renal parenchyma.   IMPRESSION: 1. No cholelithiasis or sonographic evidence for acute cholecystitis. 2. Multiple gallbladder polyps measuring up to 5 mm. No imaging follow-up needed. 3. Echogenic renal parenchyma suggestive of medical renal disease.     Electronically Signed   By: Lovey Newcomer M.D.   On: 10/02/2022 10:24 Within last 24 hrs: No results found.  Assessment/plan    I do not believe these 5 mm polyps are a source of symptoms, nor any biliary dysfunction contributing to the symptoms as well.  I advised that reimaging the gallbladder should more specific symptoms occur would be well worthwhile, but I can put no confidence in improving her symptoms by removing the gallbladder alone.  We discussed the role of reflux/hiatal hernia, and perhaps additional workup along that line.  I would be glad to see this pleasant lady again if there is any way I can assist. Patient Active Problem List   Diagnosis Date Noted   Nausea and vomiting 09/10/2022   Bunion, right foot 04/10/2021   Depression 03/24/2021   At risk for diabetes mellitus 12/17/2020   Gastroesophageal reflux disease 11/19/2020   At risk for impaired metabolic function [id number]   Menopause 11/14/2020   Prediabetes 11/05/2020   Other hyperlipidemia 11/05/2020   At risk for deficient intake of food 11/05/2020   Class 1 obesity with serious comorbidity and body mass index (BMI) of 31.0 to 31.9 in adult 10/22/2020   Pain in right wrist 09/12/2020   Class 1 obesity due to excess calories with body mass index (BMI) of 33.0 to 33.9 in adult 05/08/2020   Screening mammogram, encounter  for 04/25/2019   Dyspnea 10/31/2018   Low back pain 08/08/2018   Genetic testing 12/01/2017   Family history of breast cancer    Family history of prostate cancer    Papilloma of left breast 07/09/2017   Pedal edema 02/23/2017   Vitamin D deficiency 04/26/2015   Fatigue 11/06/2014   Hemorrhoids, external 10/13/2013   Cervical disc disorder with radiculopathy of cervical region 09/15/2013   History of herpes zoster 03/07/2013   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    These notes generated with voice recognition software. I apologize for typographical errors.  Ronny Bacon M.D., FACS 10/29/2022, 10:18 AM

## 2022-11-03 ENCOUNTER — Ambulatory Visit: Payer: Self-pay

## 2022-11-03 NOTE — Telephone Encounter (Signed)
  Chief Complaint: anxiety Symptoms: sad, upset, restless Frequency: Saturday when mother passed Pertinent Negatives: NA Disposition: [] ED /[] Urgent Care (no appt availability in office) / [] Appointment(In office/virtual)/ []  [facility]/ [] Home Care/ [] Refused Recommended Disposition /[] [facility]/ []  Follow-up with PCP Additional Notes: pt is wanting assistance with medication for funeral and just handling anxiety since mother passed away. Scheduled OV for Nov 15, 2022 at 1100 per pt request.   Reason for Disposition  [1] Anxiety symptoms AND [2] has not been evaluated for this by doctor (or NP/PA)  Answer Assessment - Initial Assessment Questions 1. CONCERN: "Did anything happen that prompted you to call today?"      Needing some assistance with anxiety d/t mother passing  2. ANXIETY SYMPTOMS: "Can you describe how you (your loved one; patient) have been feeling?" (e.g., tense, restless, panicky, anxious, keyed up, overwhelmed, sense of impending doom).      Restless, sad, upset  3. ONSET: "How long have you been feeling this way?" (e.g., hours, days, weeks)     Saturday 4. SEVERITY: "How would you rate the level of anxiety?" (e.g., 0 - 10; or mild, moderate, severe).     Mild to moderate  5. FUNCTIONAL IMPAIRMENT: "How have these feelings affected your ability to do daily activities?" "Have you had more difficulty than usual doing your normal daily activities?" (e.g., getting better, same, worse; self-care, school, work, interactions)     D/t sleeping and Midwife for funeral  Protocols used: Anxiety and Panic Attack-A-AH

## 2022-11-04 NOTE — Progress Notes (Unsigned)
   Acute Office Visit  Subjective:     Patient ID: Rachael Russell, female    DOB: 21-Mar-1966, 57 y.o.   MRN: [id number]  No chief complaint on file.   HPI Patient is in today for anxiety.  Anxiety:  -Duration:{Blank single:19197::"controlled","uncontrolled","better","worse","exacerbated","stable"} -Anxious mood: {Blank single:19197::"yes","no"}  -Excessive worrying: {Blank single:19197::"yes","no"} -Irritability: {Blank single:19197::"yes","no"}  -Sweating: {Blank single:19197::"yes","no"} -Nausea: {Blank single:19197::"yes","no"} -Palpitations:{Blank single:19197::"yes","no"} -Hyperventilation: {Blank single:19197::"yes","no"} -Panic attacks: {Blank single:19197::"yes","no"} -Agoraphobia: {Blank single:19197::"yes","no"}  -Obsessions/compulsions: {Blank single:19197::"yes","no"} -Depressed mood: {Blank single:19197::"yes","no"}    08/12/2022    9:31 AM 07/09/2022    1:59 PM 04/10/2022   10:19 AM 02/19/2022    3:03 PM 01/21/2022    8:57 AM  Depression screen PHQ 2/9  Decreased Interest 0 0 0 0 0  Down, Depressed, Hopeless 0 0 0 0 0  PHQ - 2 Score 0 0 0 0 0  Altered sleeping 0 0 0 0 0  Tired, decreased energy 0 0 0 0 0  Change in appetite 0 0 0 0 0  Feeling bad or failure about yourself  0 0 0 0 0  Trouble concentrating 0 0 0 0 0  Moving slowly or fidgety/restless 0 0 0 0 0  Suicidal thoughts 0 0 0 0 0  PHQ-9 Score 0 0 0 0 0  Difficult doing work/chores Not difficult at all Not difficult at all  Not difficult at all Not difficult at all   -Anhedonia: {Blank single:19197::"yes","no"} -Weight changes: {Blank single:19197::"yes","no"} -Insomnia: {Blank single:19197::"yes","no"} {Blank single:19197::"hard to fall asleep","hard to stay asleep"}  -Hypersomnia: {Blank single:19197::"yes","no"} -Fatigue/loss of energy: {Blank single:19197::"yes","no"} -Feelings of worthlessness: {Blank single:19197::"yes","no"} -Feelings of guilt: {Blank single:19197::"yes","no"} -Impaired  concentration/indecisiveness: {Blank single:19197::"yes","no"} -Suicidal ideations: {Blank single:19197::"yes","no"}  -Crying spells: {Blank single:19197::"yes","no"} -Recent Stressors/Life Changes: {Blank single:19197::"yes","no"}   Relationship problems: {Blank single:19197::"yes","no"}   Family stress: {Blank single:19197::"yes","no"}     Financial stress: {Blank single:19197::"yes","no"}    Job stress: {Blank single:19197::"yes","no"}    Recent death/loss: {Blank single:19197::"yes","no"} -Current Treatments: *** -Patient is compliant with the above medications at above dose and reports no side effects. *** -Past Treatments: -Counseling: ***   ROS      Objective:    LMP 04/25/2019  {Vitals History (Optional):23777}  Physical Exam  No results found for any visits on 11/05/22.      Assessment & Plan:   Problem List Items Addressed This Visit   None   No orders of the defined types were placed in this encounter.   No follow-ups on file.  Teodora Medici, DO

## 2022-11-05 ENCOUNTER — Ambulatory Visit (INDEPENDENT_AMBULATORY_CARE_PROVIDER_SITE_OTHER): Payer: Managed Care, Other (non HMO) | Admitting: Internal Medicine

## 2022-11-05 ENCOUNTER — Encounter: Payer: Self-pay | Admitting: Internal Medicine

## 2022-11-05 VITALS — BP 126/80 | HR 96 | Temp 98.4°F | Resp 16 | Ht 64.0 in | Wt 175.9 lb

## 2022-11-05 DIAGNOSIS — R93429 Abnormal radiologic findings on diagnostic imaging of unspecified kidney: Secondary | ICD-10-CM | POA: Diagnosis not present

## 2022-11-05 DIAGNOSIS — F4321 Adjustment disorder with depressed mood: Secondary | ICD-10-CM | POA: Diagnosis not present

## 2022-11-05 DIAGNOSIS — R21 Rash and other nonspecific skin eruption: Secondary | ICD-10-CM | POA: Diagnosis not present

## 2022-11-05 DIAGNOSIS — F419 Anxiety disorder, unspecified: Secondary | ICD-10-CM

## 2022-11-05 MED ORDER — CLONAZEPAM 0.5 MG PO TABS: 0.5000 mg | ORAL_TABLET | Freq: Two times a day (BID) | ORAL | 0 refills | Status: DC | PRN

## 2022-11-05 MED ORDER — TRIAMCINOLONE ACETONIDE 0.1 % EX CREA: 1.0000 | TOPICAL_CREAM | Freq: Two times a day (BID) | CUTANEOUS | 0 refills | Status: AC

## 2022-11-05 NOTE — Patient Instructions (Addendum)
It was great seeing you today!  Plan discussed at today's visit: -Anxiety medication sent to pharmacy  -Consider grief counseling and let me know if you want to do a daily medication for anxiety  -Renal US ordered  Follow up in: already scheduled  Take care and let us know if you have any questions or concerns prior to your next visit.  Dr. Rosana Berger

## 2022-11-24 ENCOUNTER — Other Ambulatory Visit: Payer: Self-pay | Admitting: Internal Medicine

## 2022-11-24 DIAGNOSIS — R112 Nausea with vomiting, unspecified: Secondary | ICD-10-CM

## 2022-11-24 NOTE — Telephone Encounter (Signed)
Requested medication (s) are due for refill today: yes  Requested medication (s) are on the active medication list: yes  Last refill:  08/12/22 #20 0 refills  Future visit scheduled: yes in 2 months  Notes to clinic:  not delegated per protocol. Do you want to refill Rx?     Requested Prescriptions  Pending Prescriptions Disp Refills   ondansetron (ZOFRAN) 4 MG tablet [Pharmacy Med Name: ONDANSETRON 4MG  TABLETS] 20 tablet 0    Sig: TAKE 1 TABLET(4 MG) BY MOUTH EVERY 8 HOURS AS NEEDED FOR NAUSEA OR VOMITING     Not Delegated - Gastroenterology: Antiemetics - ondansetron Failed - 11/24/2022  4:24 PM      Failed - This refill cannot be delegated      Passed - AST in normal range and within 360 days    AST  Date Value Ref Range Status  07/09/2022 28 0 - 40 IU/L Final         Passed - ALT in normal range and within 360 days    ALT  Date Value Ref Range Status  07/09/2022 30 0 - 32 IU/L Final         Passed - Valid encounter within last 6 months    Recent Outpatient Visits           2 weeks ago [place], DO   3 months ago Nausea and vomiting, unspecified vomiting type   [place], DO   4 months ago Nausea and vomiting, unspecified vomiting type   [facility] Teodora Medici, DO   7 months ago Well adult exam   [facility] Teodora Medici, DO   9 months ago Essential hypertension   [facility] Teodora Medici, DO       Future Appointments             In 2 months Teodora Medici, [facility], [facility]

## 2022-11-24 NOTE — Telephone Encounter (Signed)
Copied from [place] [id number]. Topic: General - Other >> Nov 24, 2022  4:25 PM Everette C wrote: Reason for CRM: Medication Refill - Medication: ondansetron (ZOFRAN) 4 MG tablet [id number]  Has the patient contacted their pharmacy? Yes.   (Agent: If no, request that the patient contact the pharmacy for the refill. If patient does not wish to contact the pharmacy document the reason why and proceed with request.) (Agent: If yes, when and what did the pharmacy advise?)  Preferred Pharmacy (with phone number or street name): [facility], [place] [address] [address] Phone: [phone] Fax: [phone] Hours: Not open 24 hours   Has the patient been seen for an appointment in the last year OR does the patient have an upcoming appointment? Yes.    Agent: Please be advised that RX refills may take up to 3 business days. We ask that you follow-up with your pharmacy.

## 2022-11-24 NOTE — Telephone Encounter (Signed)
Duplicate request

## 2023-02-09 NOTE — Progress Notes (Deleted)
Established Office Visit  Subjective:     Patient ID: Rachael Russell, female    DOB: 11/14/1965, 57 y.o.   MRN: [id number]  No chief complaint on file.   HPI Patient is in today for follow up on chronic medical conditions. At LOV patient was having a difficult time and grieving.   Hypertension: -Medications: Amlodipine 2.5 mg at night, HCTZ 25 mg, Lisinopril 40 mg. Had been on Torsemide in the past for swelling but this was stopped and she was started on HCTZ instead.  -Patient is compliant with above medications and reports no side effects. 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  Lipid Panel     Component Value Date/Time   CHOL 194 01/23/2022 1018   TRIG 54 01/23/2022 1018   HDL 64 01/23/2022 1018   CHOLHDL 3.0 01/23/2022 1018   CHOLHDL 4 05/11/2011 1230   VLDL 11.0 05/11/2011 1230   LDLCALC 120 (H) 01/23/2022 1018   LDLDIRECT 149.9 05/11/2011 1230   LABVLDL 10 01/23/2022 1018   GERD: -Currently on Pepcid 20 mg    Vitamin D Deficiency: -Currently on 2000 IU daily  -Last Vitamin D 53.7 8/22   Health Maintenance: -Blood work UTD -Mammogram - has had multiple abnormal mammograms with 3 lumpectomies, last one in 2018. Last mammogram 12/22. Has an extensive family history of breast cancer, is BRCA negative. Upcoming mammogram scheduled -Colon cancer screening: 09/24/2016, repeat in 10 years     Review of Systems  Constitutional:  Negative for chills and fever.  Respiratory:  Negative for shortness of breath.   Cardiovascular:  Negative  for chest pain.  Gastrointestinal:  Positive for nausea and vomiting. Negative for abdominal pain, blood in stool, constipation, diarrhea and melena.  Genitourinary:  Negative for dysuria, flank pain, frequency, hematuria and urgency.  Neurological:  Negative for dizziness.        Objective:    LMP 04/25/2019  BP Readings from Last 3 Encounters:  11/05/22 126/80  10/29/22 (!) 129/90  09/10/22 (!) 126/98   Wt Readings from Last 3 Encounters:  11/05/22 175 lb 14.4 oz (79.8 kg)  10/29/22 176 lb (79.8 kg)  09/10/22 181 lb (82.1 kg)      Physical Exam Constitutional:      Appearance: Normal appearance.  HENT:     Head: Normocephalic and atraumatic.  Eyes:     Conjunctiva/sclera: Conjunctivae normal.  Cardiovascular:     Rate and Rhythm: Normal rate and regular rhythm.  Pulmonary:     Effort: Pulmonary effort is normal.     Breath sounds: Normal breath sounds.  Skin:    General: Skin is warm and dry.  Neurological:     General: No focal deficit present.     Mental Status: She is alert. Mental status is at baseline.  Psychiatric:        Mood and Affect: Mood normal.        Behavior: Behavior normal.     No results found for any visits on 02/11/23.      Assessment & Plan:   1. Nausea  and vomiting, unspecified vomiting type/Gastroesophageal reflux disease, unspecified whether esophagitis present: Flares of nausea and vomiting still occurring, last one was about a week ago.  Symptoms come on quickly.  The timing does coincide with her taking an over-the-counter medication for menopause symptoms that includes sore weight.  Discussed how this has the potential to cause nausea and vomiting and I recommend she hold this medication for the next 2 to 3 weeks to see if symptoms reside.  Labs done at last office visit were without abnormalities.  She was trialed on pantoprazole but this caused headaches and dizziness, allergy noted in chart.  She is continuing her Pepcid 20 mg  daily.  Zofran refilled.  Referral placed to GI should symptoms continue.  - Ambulatory referral to Gastroenterology - ondansetron (ZOFRAN) 4 MG tablet; Take 1 tablet (4 mg total) by mouth every 8 (eight) hours as needed for nausea or vomiting.  Dispense: 20 tablet; Refill: 0 - famotidine (PEPCID) 20 MG tablet; Take 1 tablet (20 mg total) by mouth daily.  Dispense: 180 tablet; Refill: 1  2. Essential hypertension: Stable, blood pressure at goal today.  Continue lisinopril 40 mg, HCTZ 25 mg, amlodipine 2.5 mg at night.  All medications refilled today.  Follow-up in 6 months for recheck.  - amLODipine (NORVASC) 5 MG tablet; Take 0.5 tablets (2.5 mg total) by mouth daily.  Dispense: 90 tablet; Refill: 1 - hydrochlorothiazide (HYDRODIURIL) 25 MG tablet; Take 1 tablet (25 mg total) by mouth daily.  Dispense: 90 tablet; Refill: 3 - lisinopril (ZESTRIL) 40 MG tablet; TAKE 1 TABLET(40 MG) BY MOUTH DAILY  Dispense: 90 tablet; Refill: 3  3. Pure hypercholesterolemia: Chronic and stable.  Continue Lipitor 10 mg, refilled.  - atorvastatin (LIPITOR) 10 MG tablet; Take 1 tablet (10 mg total) by mouth daily.  Dispense: 90 tablet; Refill: 3   No follow-ups on file.  Margarita Mail, DO

## 2023-02-11 ENCOUNTER — Ambulatory Visit: Payer: Managed Care, Other (non HMO) | Admitting: Internal Medicine

## 2023-03-03 NOTE — Progress Notes (Unsigned)
Established Office Visit  Subjective:     Patient ID: Rachael Russell, female    DOB: [birth year], 57 y.o.   MRN: [id number]  No chief complaint on file.   HPI Patient is in today for follow up on chronic medical conditions. At LOV patient was having a difficult time and grieving.   Hypertension: -Medications: Amlodipine 2.5 mg at night, HCTZ 25 mg, Lisinopril 40 mg. Had been on Torsemide in the past for swelling but this was stopped and she was started on HCTZ instead.  -Patient is compliant with above medications and reports no side effects. 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  Lipid Panel     Component Value Date/Time   CHOL 194 01/23/2022 1018   TRIG 54 01/23/2022 1018   HDL 64 01/23/2022 1018   CHOLHDL 3.0 01/23/2022 1018   CHOLHDL 4 05/11/2011 1230   VLDL 11.0 05/11/2011 1230   LDLCALC 120 (H) 01/23/2022 1018   LDLDIRECT 149.9 05/11/2011 1230   LABVLDL 10 01/23/2022 1018   GERD: -Currently on Pepcid 20 mg    Vitamin D Deficiency: -Currently on 2000 IU daily  -Last Vitamin D 53.7 8/22   Health Maintenance: -Blood work UTD -Mammogram - has had multiple abnormal mammograms with 3 lumpectomies, last one in 2018. Last mammogram 12/22. Has an extensive family history of breast cancer, is BRCA negative. Upcoming mammogram scheduled -Colon cancer screening: 09/24/2016, repeat in 10 years     Review of Systems  Constitutional:  Negative for chills and fever.  Respiratory:  Negative for shortness of breath.   Cardiovascular:  Negative  for chest pain.  Gastrointestinal:  Positive for nausea and vomiting. Negative for abdominal pain, blood in stool, constipation, diarrhea and melena.  Genitourinary:  Negative for dysuria, flank pain, frequency, hematuria and urgency.  Neurological:  Negative for dizziness.        Objective:    LMP 04/25/2019  BP Readings from Last 3 Encounters:  11/05/22 126/80  10/29/22 (!) 129/90  09/10/22 (!) 126/98   Wt Readings from Last 3 Encounters:  11/05/22 175 lb 14.4 oz (79.8 kg)  10/29/22 176 lb (79.8 kg)  09/10/22 181 lb (82.1 kg)      Physical Exam Constitutional:      Appearance: Normal appearance.  HENT:     Head: Normocephalic and atraumatic.  Eyes:     Conjunctiva/sclera: Conjunctivae normal.  Cardiovascular:     Rate and Rhythm: Normal rate and regular rhythm.  Pulmonary:     Effort: Pulmonary effort is normal.     Breath sounds: Normal breath sounds.  Skin:    General: Skin is warm and dry.  Neurological:     General: No focal deficit present.     Mental Status: She is alert. Mental status is at baseline.  Psychiatric:        Mood and Affect: Mood normal.        Behavior: Behavior normal.     No results found for any visits on 03/04/23.      Assessment & Plan:   1. Nausea  and vomiting, unspecified vomiting type/Gastroesophageal reflux disease, unspecified whether esophagitis present: Flares of nausea and vomiting still occurring, last one was about a week ago.  Symptoms come on quickly.  The timing does coincide with her taking an over-the-counter medication for menopause symptoms that includes sore weight.  Discussed how this has the potential to cause nausea and vomiting and I recommend she hold this medication for the next 2 to 3 weeks to see if symptoms reside.  Labs done at last office visit were without abnormalities.  She was trialed on pantoprazole but this caused headaches and dizziness, allergy noted in chart.  She is continuing her Pepcid 20 mg  daily.  Zofran refilled.  Referral placed to GI should symptoms continue.  - Ambulatory referral to Gastroenterology - ondansetron (ZOFRAN) 4 MG tablet; Take 1 tablet (4 mg total) by mouth every 8 (eight) hours as needed for nausea or vomiting.  Dispense: 20 tablet; Refill: 0 - famotidine (PEPCID) 20 MG tablet; Take 1 tablet (20 mg total) by mouth daily.  Dispense: 180 tablet; Refill: 1  2. Essential hypertension: Stable, blood pressure at goal today.  Continue lisinopril 40 mg, HCTZ 25 mg, amlodipine 2.5 mg at night.  All medications refilled today.  Follow-up in 6 months for recheck.  - amLODipine (NORVASC) 5 MG tablet; Take 0.5 tablets (2.5 mg total) by mouth daily.  Dispense: 90 tablet; Refill: 1 - hydrochlorothiazide (HYDRODIURIL) 25 MG tablet; Take 1 tablet (25 mg total) by mouth daily.  Dispense: 90 tablet; Refill: 3 - lisinopril (ZESTRIL) 40 MG tablet; TAKE 1 TABLET(40 MG) BY MOUTH DAILY  Dispense: 90 tablet; Refill: 3  3. Pure hypercholesterolemia: Chronic and stable.  Continue Lipitor 10 mg, refilled.  - atorvastatin (LIPITOR) 10 MG tablet; Take 1 tablet (10 mg total) by mouth daily.  Dispense: 90 tablet; Refill: 3   No follow-ups on file.  Margarita Mail, DO

## 2023-03-04 ENCOUNTER — Ambulatory Visit: Payer: Managed Care, Other (non HMO) | Admitting: Internal Medicine

## 2023-03-04 ENCOUNTER — Encounter: Payer: Self-pay | Admitting: Internal Medicine

## 2023-03-04 VITALS — BP 120/84 | HR 90 | Temp 98.1°F | Resp 18 | Ht 64.0 in | Wt 172.3 lb

## 2023-03-04 DIAGNOSIS — R93429 Abnormal radiologic findings on diagnostic imaging of unspecified kidney: Secondary | ICD-10-CM

## 2023-03-04 DIAGNOSIS — R112 Nausea with vomiting, unspecified: Secondary | ICD-10-CM

## 2023-03-04 DIAGNOSIS — E7800 Pure hypercholesterolemia, unspecified: Secondary | ICD-10-CM

## 2023-03-04 DIAGNOSIS — F4321 Adjustment disorder with depressed mood: Secondary | ICD-10-CM

## 2023-03-04 DIAGNOSIS — R7303 Prediabetes: Secondary | ICD-10-CM | POA: Diagnosis not present

## 2023-03-04 DIAGNOSIS — K219 Gastro-esophageal reflux disease without esophagitis: Secondary | ICD-10-CM | POA: Diagnosis not present

## 2023-03-04 DIAGNOSIS — I10 Essential (primary) hypertension: Secondary | ICD-10-CM | POA: Diagnosis not present

## 2023-03-04 DIAGNOSIS — F419 Anxiety disorder, unspecified: Secondary | ICD-10-CM

## 2023-03-04 MED ORDER — AMLODIPINE BESYLATE 5 MG PO TABS: 2.5000 mg | ORAL_TABLET | Freq: Every day | ORAL | 1 refills | Status: DC

## 2023-03-04 MED ORDER — HYDROXYZINE HCL 10 MG PO TABS
10.0000 mg | ORAL_TABLET | Freq: Every day | ORAL | 0 refills | Status: DC | PRN
Start: 2023-03-04 — End: 2023-09-07

## 2023-03-04 MED ORDER — ONDANSETRON HCL 4 MG PO TABS: 4.0000 mg | ORAL_TABLET | ORAL | 0 refills | Status: DC | PRN

## 2023-03-18 LAB — COMPREHENSIVE METABOLIC PANEL
ALT: 33 IU/L — ABNORMAL HIGH (ref 0–32)
AST: 33 IU/L (ref 0–40)
Albumin: 4.3 g/dL (ref 3.8–4.9)
Alkaline Phosphatase: 101 IU/L (ref 44–121)
BUN/Creatinine Ratio: 20 (ref 9–23)
BUN: 20 mg/dL (ref 6–24)
Bilirubin Total: 0.3 mg/dL (ref 0.0–1.2)
CO2: 24 mmol/L (ref 20–29)
Chloride: 103 mmol/L (ref 96–106)
Creatinine, Ser: 1.02 mg/dL — ABNORMAL HIGH (ref 0.57–1.00)
Globulin, Total: 2.6 g/dL (ref 1.5–4.5)
Glucose: 82 mg/dL (ref 70–99)
Potassium: 4.3 mmol/L (ref 3.5–5.2)
Sodium: 141 mmol/L (ref 134–144)
eGFR: 64 mL/min/{1.73_m2} (ref >59)

## 2023-03-18 LAB — HEMOGLOBIN A1C: Hgb A1c MFr Bld: 6 % — ABNORMAL HIGH (ref 4.8–5.6)

## 2023-03-18 LAB — CBC WITH DIFFERENTIAL/PLATELET
Basophils Absolute: 0 10*3/uL (ref 0.0–0.2)
EOS (ABSOLUTE): 0.1 10*3/uL (ref 0.0–0.4)
Eos: 1 %
Hematocrit: 41.9 % (ref 34.0–46.6)
Hemoglobin: 14.1 g/dL (ref 11.1–15.9)
Immature Grans (Abs): 0 10*3/uL (ref 0.0–0.1)
Immature Granulocytes: 0 %
Lymphs: 28 %
MCHC: 33.7 g/dL (ref 31.5–35.7)
MCV: 90 fL (ref 79–97)
Monocytes: 8 %
Neutrophils: 63 %
Platelets: 321 10*3/uL (ref 150–450)
RBC: 4.65 x10E6/uL (ref 3.77–5.28)
RDW: 12 % (ref 11.7–15.4)
WBC: 8.1 10*3/uL (ref 3.4–10.8)

## 2023-03-18 LAB — LIPID PANEL
Chol/HDL Ratio: 3.5 ratio (ref 0.0–4.4)
Cholesterol, Total: 179 mg/dL (ref 100–199)
HDL: 51 mg/dL (ref >39)
Triglycerides: 50 mg/dL (ref 0–149)
VLDL Cholesterol Cal: 10 mg/dL (ref 5–40)

## 2023-07-08 ENCOUNTER — Other Ambulatory Visit: Payer: Self-pay | Admitting: Internal Medicine

## 2023-07-08 DIAGNOSIS — E7800 Pure hypercholesterolemia, unspecified: Secondary | ICD-10-CM

## 2023-07-08 NOTE — Telephone Encounter (Signed)
Requested Prescriptions  Pending Prescriptions Disp Refills   atorvastatin (LIPITOR) 10 MG tablet [Pharmacy Med Name: Atorvastatin Calcium 10 MG Oral Tablet] 90 tablet 2    Sig: TAKE 1 TABLET BY MOUTH DAILY     Cardiovascular:  Antilipid - Statins Failed - 07/08/2023  4:51 AM      Failed - Lipid Panel in normal range within the last 12 months    Cholesterol, Total  Date Value Ref Range Status  03/17/2023 179 100 - 199 mg/dL Final   LDL Chol Calc (NIH)  Date Value Ref Range Status  03/17/2023 118 (H) 0 - 99 mg/dL Final   Direct LDL  Date Value Ref Range Status  05/11/2011 149.9 mg/dL Final    Comment:    Optimal:  <100 mg/dLNear or Above Optimal:  100-129 mg/dLBorderline High:  130-159 mg/dLHigh:  160-189 mg/dLVery High:  >190 mg/dL   HDL  Date Value Ref Range Status  03/17/2023 51 >39 mg/dL Final   Triglycerides  Date Value Ref Range Status  03/17/2023 50 0 - 149 mg/dL Final         Passed - Patient is not pregnant      Passed - Valid encounter within last 12 months    Recent Outpatient Visits           4 months ago Essential hypertension   [facility] Margarita Mail, DO   8 months ago Anxiety   [facility] Margarita Mail, DO   11 months ago Nausea and vomiting, unspecified vomiting type   [facility] Margarita Mail, DO   12 months ago Nausea and vomiting, unspecified vomiting type   [facility] Margarita Mail, DO   1 year ago Well adult exam   [facility] Margarita Mail, DO       Future Appointments             In 2 months Margarita Mail, DO [facility], [facility]

## 2023-08-20 LAB — HM MAMMOGRAPHY

## 2023-08-22 ENCOUNTER — Other Ambulatory Visit: Payer: Self-pay | Admitting: Internal Medicine

## 2023-08-22 DIAGNOSIS — I10 Essential (primary) hypertension: Secondary | ICD-10-CM

## 2023-08-23 ENCOUNTER — Encounter: Payer: Self-pay | Admitting: Internal Medicine

## 2023-08-26 NOTE — Telephone Encounter (Signed)
 Requested Prescriptions  Pending Prescriptions Disp Refills   lisinopril  (ZESTRIL ) 40 MG tablet [Pharmacy Med Name: Lisinopril  40 MG Oral Tablet] 90 tablet 3    Sig: TAKE 1 TABLET BY MOUTH DAILY     Cardiovascular:  ACE Inhibitors Failed - 08/26/2023  3:24 PM      Failed - Cr in normal range and within 180 days    Creatinine, Ser  Date Value Ref Range Status  03/17/2023 1.02 (H) 0.57 - 1.00 mg/dL Final         Passed - K in normal range and within 180 days    Potassium  Date Value Ref Range Status  03/17/2023 4.3 3.5 - 5.2 mmol/L Final         Passed - Patient is not pregnant      Passed - Last BP in normal range    BP Readings from Last 1 Encounters:  03/04/23 120/84         Passed - Valid encounter within last 6 months    Recent Outpatient Visits           5 months ago Essential hypertension   [facility] Bernardo Fend, DO   9 months ago Anxiety   [facility] Bernardo Fend, DO   1 year ago Nausea and vomiting, unspecified vomiting type   [facility] Bernardo Fend, DO   1 year ago Nausea and vomiting, unspecified vomiting type   [facility] Bernardo Fend, DO   1 year ago Well adult exam   [facility] Bernardo Fend, DO       Future Appointments             In 1 week Bernardo Fend, DO [facility]             hydrochlorothiazide  (HYDRODIURIL ) 25 MG tablet [Pharmacy Med Name: hydroCHLOROthiazide  25 MG Oral Tablet] 90 tablet 3    Sig: TAKE 1 TABLET BY MOUTH DAILY     Cardiovascular: Diuretics - Thiazide Failed - 08/26/2023  3:24 PM      Failed - Cr in normal range and within 180 days    Creatinine, Ser  Date Value Ref Range Status  03/17/2023 1.02 (H) 0.57 - 1.00 mg/dL Final         Passed - K in normal range and within 180 days    Potassium  Date Value Ref Range  Status  03/17/2023 4.3 3.5 - 5.2 mmol/L Final         Passed - Na in normal range and within 180 days    Sodium  Date Value Ref Range Status  03/17/2023 141 134 - 144 mmol/L Final         Passed - Last BP in normal range    BP Readings from Last 1 Encounters:  03/04/23 120/84         Passed - Valid encounter within last 6 months    Recent Outpatient Visits           5 months ago Essential hypertension   [facility] Bernardo Fend, DO   9 months ago Anxiety   [facility] Bernardo Fend, DO   1 year ago Nausea and vomiting, unspecified vomiting type   [facility] Bernardo Fend, DO   1 year ago Nausea and vomiting, unspecified vomiting type   [facility] Bernardo Fend, DO   1 year ago Well adult exam   [facility] Bernardo Fend, DO       Future Appointments             In 1 week Bernardo Fend, DO [facility], [facility]

## 2023-08-31 ENCOUNTER — Encounter: Payer: Self-pay | Admitting: Internal Medicine

## 2023-09-07 ENCOUNTER — Ambulatory Visit: Payer: Managed Care, Other (non HMO) | Admitting: Internal Medicine

## 2023-09-07 ENCOUNTER — Encounter: Payer: Self-pay | Admitting: Internal Medicine

## 2023-09-07 ENCOUNTER — Other Ambulatory Visit: Payer: Self-pay

## 2023-09-07 VITALS — BP 124/76 | HR 100 | Temp 98.1°F | Resp 16 | Ht 64.0 in | Wt 174.2 lb

## 2023-09-07 DIAGNOSIS — R7303 Prediabetes: Secondary | ICD-10-CM

## 2023-09-07 DIAGNOSIS — I10 Essential (primary) hypertension: Secondary | ICD-10-CM

## 2023-09-07 DIAGNOSIS — E7800 Pure hypercholesterolemia, unspecified: Secondary | ICD-10-CM

## 2023-09-07 DIAGNOSIS — K219 Gastro-esophageal reflux disease without esophagitis: Secondary | ICD-10-CM | POA: Diagnosis not present

## 2023-09-07 LAB — POCT GLYCOSYLATED HEMOGLOBIN (HGB A1C): Hemoglobin A1C: 6.1 % — AB (ref 4.0–5.6)

## 2023-09-07 MED ORDER — ATORVASTATIN CALCIUM 10 MG PO TABS
10.0000 mg | ORAL_TABLET | Freq: Every day | ORAL | 1 refills | Status: DC
Start: 2023-09-07 — End: 2024-03-07

## 2023-09-07 MED ORDER — AMLODIPINE BESYLATE 5 MG PO TABS: 2.5000 mg | ORAL_TABLET | Freq: Every day | ORAL | 1 refills | Status: DC

## 2023-09-07 MED ORDER — HYDROCHLOROTHIAZIDE 25 MG PO TABS
25.0000 mg | ORAL_TABLET | Freq: Every day | ORAL | 1 refills | Status: DC
Start: 2023-09-07 — End: 2024-03-07

## 2023-09-07 MED ORDER — LISINOPRIL 40 MG PO TABS
40.0000 mg | ORAL_TABLET | Freq: Every day | ORAL | 1 refills | Status: DC
Start: 2023-09-07 — End: 2024-03-07

## 2023-09-07 NOTE — Assessment & Plan Note (Addendum)
 Check A1c today 6.1%.

## 2023-09-07 NOTE — Assessment & Plan Note (Signed)
 Stable, refill statin and plan to recheck fasting labs at follow up.

## 2023-09-07 NOTE — Assessment & Plan Note (Signed)
 Blood pressure stable here today, no changes made to medications and appropriate refills sent to pharmacy.

## 2023-09-07 NOTE — Assessment & Plan Note (Signed)
 Nausea/vomiting has resolved, stopped Zofran and Pepcid.

## 2023-09-07 NOTE — Progress Notes (Signed)
 Established Patient Office Visit  Subjective   Patient ID: Rachael Russell, female    DOB: [birth year]  Age: 58 y.o. MRN: [id number]  Chief Complaint  Patient presents with   Medical Management of Chronic Issues    6 month recheck    HPI  Patient is in today for follow up on chronic medical conditions. She has been doing well since our last office visit.   Hypertension: -Medications: Amlodipine  2.5 mg at night, HCTZ 25 mg, Lisinopril  40 mg. Had been on Torsemide  in the past for swelling but this was stopped and she was started on HCTZ instead.  -Patient is compliant with above medications and reports no side effects. -Checking BP at home (average): 120-130/80-90 -Denies any headache, SOB, CP, vision changes or symptoms of hypotension  HLD: -Medications: Lipitor 10 mg -Patient is compliant with above medications and reports no side effects.  -Last lipid panel: Lipid Panel     Component Value Date/Time   CHOL 179 03/17/2023 1026   TRIG 50 03/17/2023 1026   HDL 51 03/17/2023 1026   CHOLHDL 3.5 03/17/2023 1026   CHOLHDL 4 05/11/2011 1230   VLDL 11.0 05/11/2011 1230   LDLCALC 118 (H) 03/17/2023 1026   LDLDIRECT 149.9 05/11/2011 1230   LABVLDL 10 03/17/2023 1026   GERD: -Had been on Pepcid  20 mg, Zofran  as needed but has stopped both as her nausea and vomiting has resolved.  -Saw GI for workup for excessive nausea and vomiting, reviewed telemedicine visit from 08/27/2022  -On 09/10/2022 she underwent an EGD which was normal.  She also had a right upper quadrant ultrasound and gastric emptying study which was all normal. -Right upper quadrant ultrasound showing echogenic renal parenchyma suggestive of medical renal disease, however labs from November and July of last year showing normal creatinine and GFR  Pre-Diabetes: -Last A1c 6.0% 7/24 -Not currently on medications   Vitamin D  Deficiency: -Currently on 2000 IU daily  -Last Vitamin D  53.7 8/22   Health  Maintenance: -Blood work UTD -Mammogram - 12/24 -Colon cancer screening: 09/24/2016, repeat in 10 years  -Pap in 2022, plan to repeat at physical this year  Patient Active Problem List   Diagnosis Date Noted   Nausea and vomiting 09/10/2022   Bunion, right foot 04/10/2021   Depression 03/24/2021   At risk for diabetes mellitus 12/17/2020   Gastroesophageal reflux disease 11/19/2020   At risk for impaired metabolic function 11/19/2020   Menopause 11/14/2020   Prediabetes 11/05/2020   Other hyperlipidemia 11/05/2020   At risk for deficient intake of food 11/05/2020   Class 1 obesity with serious comorbidity and body mass index (BMI) of 31.0 to 31.9 in adult 10/22/2020   Pain in right wrist 09/12/2020   Class 1 obesity due to excess calories with body mass index (BMI) of 33.0 to 33.9 in adult 05/08/2020   Screening mammogram, encounter for 04/25/2019   Dyspnea 10/31/2018   Low back pain 08/08/2018   Genetic testing 12/01/2017   Family history of breast cancer    Family history of prostate cancer    Papilloma of left breast 07/09/2017   Pedal edema 02/23/2017   Vitamin D  deficiency 04/26/2015   Fatigue 11/06/2014   Hemorrhoids, external 10/13/2013   Cervical disc disorder with radiculopathy of cervical region 09/15/2013   History of herpes zoster 03/07/2013   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   Routine general medical examination at a health care facility 05/11/2011   Allergic rhinitis 12/19/2010   Fibrocystic breast  disease 12/19/2010   GERD 04/14/2010   SINUSITIS, CHRONIC FRONTAL 04/22/2007   Hyperlipidemia 12/21/2006   Essential hypertension 12/21/2006   Past Medical History:  Diagnosis Date   Anemia    Back pain    Bulging lumbar disc    Family history of adverse reaction to anesthesia    mother has difficult intubation   Family history of breast cancer    Family history of prostate cancer    Fibroadenoma of breast, left    Fibrocystic breast, right     GERD (gastroesophageal reflux disease)    Heartburn    Hyperlipidemia    Hypertension    Irritable bowel syndrome (IBS)    Knee pain    Lactose intolerance    Menopause    Menorrhagia    Obesity    Papilloma of left breast 07/09/2017   SOBOE (shortness of breath on exertion)    Swelling of both lower extremities    Vitamin D  deficiency    Wears glasses    Past Surgical History:  Procedure Laterality Date   BREAST DUCTAL SYSTEM EXCISION Left 11/18/2012   Procedure: EXCISION DUCTAL SYSTEM BREAST;  Surgeon: Elon CHRISTELLA Pacini, MD;  Location: [facility];  Service: General;  Laterality: Left;   BREAST LUMPECTOMY WITH NEEDLE LOCALIZATION Left 11/18/2012   Procedure: excise ductal system left breast. subarealar. left partial mastectomy with radiographic guidance;  Surgeon: Elon CHRISTELLA Pacini, MD;  Location: [facility];  Service: General;  Laterality: Left;  excise ductal system left breast. subarealar. left partial mastectomy with radiographic guidance   BREAST LUMPECTOMY WITH NEEDLE LOCALIZATION Left 03/27/2015   Procedure: LEFT BREAST LUMPECTOMY  WITH NEEDLE LOCALIZATION TIMES TWO;  Surgeon: Elon Pacini, MD;  Location: [facility];  Service: General;  Laterality: Left;   BREAST LUMPECTOMY WITH RADIOACTIVE SEED LOCALIZATION Left 07/09/2017   Procedure: LEFT BREAST LUMPECTOMY WITH RADIOACTIVE SEED LOCALIZATION;  Surgeon: Pacini Elon, MD;  Location: [facility];  Service: General;  Laterality: Left;  ERAS PATHWAY   BUNIONECTOMY Right    CESAREAN SECTION  [phone]   DILATION AND CURETTAGE OF UTERUS  1998   DILITATION & CURRETTAGE/HYSTROSCOPY WITH NOVASURE ABLATION N/A 08/05/2016   Procedure: DILATATION & CURETTAGE/HYSTEROSCOPY WITH NOVASURE ABLATION;  Surgeon: Marie-Lyne Lavoie, MD;  Location: [facility];  Service: Gynecology;  Laterality: N/A;   ESOPHAGOGASTRODUODENOSCOPY (EGD) WITH PROPOFOL  N/A 09/10/2022   Procedure:  ESOPHAGOGASTRODUODENOSCOPY (EGD) WITH PROPOFOL ;  Surgeon: Unk Corinn Skiff, MD;  Location: [facility];  Service: Endoscopy;  Laterality: N/A;   TONSILLECTOMY  age 3   UMBILICAL HERNIA REPAIR  2006   Social History   Tobacco Use   Smoking status: Never   Smokeless tobacco: Never  Vaping Use   Vaping status: Never Used  Substance Use Topics   Alcohol use: Not Currently   Drug use: No   Social History   Socioeconomic History   Marital status: Married    Spouse name: Kathleena Freeman   Number of children: 2   Years of education: Not on file   Highest education level: Bachelor's degree (e.g., BA, AB, BS)  Occupational History   Occupation: Dietitian 2    Comment: Development Worker, Community- foster care  Tobacco Use   Smoking status: Never   Smokeless tobacco: Never  Vaping Use   Vaping status: Never Used  Substance and Sexual Activity   Alcohol use: Not Currently   Drug use: No   Sexual activity: Yes    Birth  control/protection: Other-see comments, Surgical    Comment: vasectomy  Other Topics Concern   Not on file  Social History Narrative   Not on file   Social Drivers of Health   Financial Resource Strain: Low Risk  (09/06/2023)   Overall Financial Resource Strain (CARDIA)    Difficulty of Paying Living Expenses: Not hard at all  Food Insecurity: No Food Insecurity (09/06/2023)   Hunger Vital Sign    Worried About Running Out of Food in the Last Year: Never true    Ran Out of Food in the Last Year: Never true  Transportation Needs: No Transportation Needs (09/06/2023)   PRAPARE - Administrator, Civil Service (Medical): No    Lack of Transportation (Non-Medical): No  Physical Activity: Unknown (09/06/2023)   Exercise Vital Sign    Days of Exercise per Week: 0 days    Minutes of Exercise per Session: Not on file  Stress: Stress Concern Present (09/06/2023)   [employer] of Occupational Health - Occupational Stress Questionnaire     Feeling of Stress : To some extent  Social Connections: Socially Integrated (09/06/2023)   Social Connection and Isolation Panel [NHANES]    Frequency of Communication with Friends and Family: More than three times a week    Frequency of Social Gatherings with Friends and Family: Once a week    Attends Religious Services: More than 4 times per year    Active Member of [employer] or Organizations: Yes    Attends Engineer, Structural: More than 4 times per year    Marital Status: Married  Catering Manager Violence: Not At Risk (04/10/2022)   Humiliation, Afraid, Rape, and Kick questionnaire    Fear of Current or Ex-Partner: No    Emotionally Abused: No    Physically Abused: No    Sexually Abused: No   Family Status  Relation Name Status   Mother  Alive   Father  Alive   MGM  Deceased   Pat Aunt  Deceased   Nutritional Therapist  Alive   Brother  Alive   Mat Uncle x2 Alive   MGF  Deceased   PGM  Deceased   PGF  Deceased   Nutritional Therapist  Alive  No partnership data on file   Family History  Problem Relation Age of Onset   Hypertension Mother    Arthritis Mother        osteoarthritis   Breast cancer Mother 79   Hyperlipidemia Mother    Cancer Mother    Obesity Mother    Hypertension Father    Arthritis Father        osteoarthritis   Hyperlipidemia Father    Breast cancer Maternal Grandmother 62       bilateral breast cancer   Breast cancer Paternal Aunt 60       bilateral breast cancer   Prostate cancer Paternal Uncle    Tuberculosis Paternal Grandmother    Prostate cancer Paternal Uncle    Allergies  Allergen Reactions   Ferrous Sulfate     constipation   Pantoprazole  Sodium     Headache   Penicillins Other (See Comments)    Unknown childhood reaction   Potassium-Containing Compounds Other (See Comments)    headaches   Prilosec [Omeprazole Magnesium]     headache   Chlorhexidine  Gluconate Rash      Review of Systems  All other systems reviewed and are negative.      Objective:  BP 124/76 (Cuff Size: Large)   Pulse 100   Temp 98.1 F (36.7 C) (Oral)   Resp 16   Ht 5' 4 (1.626 m)   Wt 174 lb 3.2 oz (79 kg)   LMP 04/25/2019   BMI 29.90 kg/m  BP Readings from Last 3 Encounters:  09/07/23 124/76  03/04/23 120/84  11/05/22 126/80   Wt Readings from Last 3 Encounters:  09/07/23 174 lb 3.2 oz (79 kg)  03/04/23 172 lb 4.8 oz (78.2 kg)  11/05/22 175 lb 14.4 oz (79.8 kg)      Physical Exam Constitutional:      Appearance: Normal appearance.  HENT:     Head: Normocephalic and atraumatic.  Eyes:     Conjunctiva/sclera: Conjunctivae normal.  Neck:     Comments: No thyromegaly Cardiovascular:     Rate and Rhythm: Normal rate and regular rhythm.  Pulmonary:     Effort: Pulmonary effort is normal.     Breath sounds: Normal breath sounds.  Musculoskeletal:     Cervical back: No tenderness.     Right lower leg: No edema.     Left lower leg: No edema.  Lymphadenopathy:     Cervical: No cervical adenopathy.  Skin:    General: Skin is warm and dry.  Neurological:     General: No focal deficit present.     Mental Status: She is alert. Mental status is at baseline.  Psychiatric:        Mood and Affect: Mood normal.        Behavior: Behavior normal.      Results for orders placed or performed in visit on 09/07/23  POCT HgB A1C  Result Value Ref Range   Hemoglobin A1C 6.1 (A) 4.0 - 5.6 %   HbA1c POC (<> result, manual entry)     HbA1c, POC (prediabetic range)     HbA1c, POC (controlled diabetic range)      Last CBC Lab Results  Component Value Date   WBC 8.1 03/17/2023   HGB 14.1 03/17/2023   HCT 41.9 03/17/2023   MCV 90 03/17/2023   MCH 30.3 03/17/2023   RDW 12.0 03/17/2023   PLT 321 03/17/2023   Last metabolic panel Lab Results  Component Value Date   GLUCOSE 82 03/17/2023   NA 141 03/17/2023   K 4.3 03/17/2023   CL 103 03/17/2023   CO2 24 03/17/2023   BUN 20 03/17/2023   CREATININE 1.02 (H) 03/17/2023   EGFR  64 03/17/2023   CALCIUM  9.8 03/17/2023   PHOS 3.6 10/28/2018   PROT 6.9 03/17/2023   ALBUMIN 4.3 03/17/2023   LABGLOB 2.6 03/17/2023   AGRATIO 1.8 07/09/2022   BILITOT 0.3 03/17/2023   ALKPHOS 101 03/17/2023   AST 33 03/17/2023   ALT 33 (H) 03/17/2023   ANIONGAP 14 05/22/2020   Last lipids Lab Results  Component Value Date   CHOL 179 03/17/2023   HDL 51 03/17/2023   LDLCALC 118 (H) 03/17/2023   LDLDIRECT 149.9 05/11/2011   TRIG 50 03/17/2023   CHOLHDL 3.5 03/17/2023   Last hemoglobin A1c Lab Results  Component Value Date   HGBA1C 6.1 (A) 09/07/2023   Last thyroid functions Lab Results  Component Value Date   TSH 2.410 04/04/2021   T3TOTAL 135 10/22/2020   Last vitamin D  Lab Results  Component Value Date   VD25OH 54.5 01/23/2022   Last vitamin B12 and Folate Lab Results  Component Value Date   VITAMINB12 768 10/22/2020  FOLATE 10.4 10/22/2020      The 10-year ASCVD risk score (Arnett DK, et al., 2019) is: 5.2%    Assessment & Plan:  Essential hypertension Assessment & Plan: Blood pressure stable here today, no changes made to medications and appropriate refills sent to pharmacy.    Orders: -     hydroCHLOROthiazide ; Take 1 tablet (25 mg total) by mouth daily.  Dispense: 90 tablet; Refill: 1 -     Lisinopril ; Take 1 tablet (40 mg total) by mouth daily.  Dispense: 90 tablet; Refill: 1 -     amLODIPine  Besylate; Take 0.5 tablets (2.5 mg total) by mouth daily.  Dispense: 90 tablet; Refill: 1  Pure hypercholesterolemia Assessment & Plan: Stable, refill statin and plan to recheck fasting labs at follow up.   Orders: -     Atorvastatin  Calcium ; Take 1 tablet (10 mg total) by mouth daily.  Dispense: 90 tablet; Refill: 1  Prediabetes Assessment & Plan: Check A1c today 6.1%.  Orders: -     POCT glycosylated hemoglobin (Hb A1C)  Gastroesophageal reflux disease, unspecified whether esophagitis present Assessment & Plan: Nausea/vomiting has resolved,  stopped Zofran  and Pepcid .      Return in about 6 months (around 03/06/2024) for physical w/Pap.    Sharyle Fischer, DO

## 2023-09-16 ENCOUNTER — Encounter: Payer: Self-pay | Admitting: Internal Medicine

## 2023-09-30 ENCOUNTER — Ambulatory Visit
Admission: RE | Admit: 2023-09-30 | Discharge: 2023-09-30 | Disposition: A | Discharge status: Home / Self Care | Payer: Managed Care, Other (non HMO) | Source: Ambulatory Visit | Attending: Internal Medicine | Admitting: Internal Medicine

## 2023-09-30 ENCOUNTER — Ambulatory Visit: Payer: Managed Care, Other (non HMO) | Admitting: Internal Medicine

## 2023-09-30 ENCOUNTER — Encounter: Payer: Self-pay | Admitting: Internal Medicine

## 2023-09-30 ENCOUNTER — Ambulatory Visit
Admission: RE | Admit: 2023-09-30 | Discharge: 2023-09-30 | Disposition: A | Discharge status: Home / Self Care | Payer: Managed Care, Other (non HMO) | Attending: Internal Medicine | Admitting: Internal Medicine

## 2023-09-30 VITALS — BP 126/82 | HR 86 | Resp 16 | Ht 64.0 in | Wt 175.4 lb

## 2023-09-30 DIAGNOSIS — M25561 Pain in right knee: Secondary | ICD-10-CM | POA: Diagnosis present

## 2023-09-30 MED ORDER — NAPROXEN 500 MG PO TABS: 500.0000 mg | ORAL_TABLET | Freq: Two times a day (BID) | ORAL | 0 refills | Status: AC

## 2023-09-30 NOTE — Progress Notes (Signed)
 Acute Office Visit  Subjective:     Patient ID: Rachael Russell, female    DOB: May 03, 1966, 58 y.o.   MRN: [id number]  Chief Complaint  Patient presents with   Knee Injury    Possible injury at a grocery store about 2 weeks ago, pt semi turned quickly and heard her R knee pop    HPI Patient is in today for right knee pain. Medial pain with walking, was walking had foot planted and felt a pop with immediate pain and could not bear weight. Now weight bearing but with pain. Knee swollen and painful.    KNEE PAIN Duration:  2 weeks Involved knee: right Mechanism of injury:  planted foot and twisted, heard a pop and felt immediate pain Location:medial Onset: sudden Severity: moderate  Quality:  dull, aching, and sore Frequency: constant Radiation: no Aggravating factors: weight bearing, walking, bending, and movement  Alleviating factors: APAP helped slightly  Status: fluctuating Treatments attempted: APAP  Relief with NSAIDs?:  No NSAIDs Taken Weakness with weight bearing or walking: yes Sensation of giving way: no Locking: no Popping:  at initial event no longer Bruising: no Swelling: yes Redness: no Paresthesias/decreased sensation: no Fevers: no   Review of Systems  Constitutional:  Negative for chills and fever.  Musculoskeletal:  Positive for joint pain.  Skin: Negative.         Objective:    BP 126/82   Pulse 86   Resp 16   Ht 5' 4 (1.626 m)   Wt 175 lb 6.4 oz (79.6 kg)   LMP 04/25/2019   SpO2 94%   BMI 30.11 kg/m  BP Readings from Last 3 Encounters:  09/30/23 126/82  09/07/23 124/76  03/04/23 120/84   Wt Readings from Last 3 Encounters:  09/30/23 175 lb 6.4 oz (79.6 kg)  09/07/23 174 lb 3.2 oz (79 kg)  03/04/23 172 lb 4.8 oz (78.2 kg)      Physical Exam Constitutional:      Appearance: Normal appearance.  HENT:     Head: Normocephalic and atraumatic.  Eyes:     Conjunctiva/sclera: Conjunctivae normal.  Cardiovascular:     Rate and  Rhythm: Normal rate and regular rhythm.  Pulmonary:     Effort: Pulmonary effort is normal.     Breath sounds: Normal breath sounds.  Musculoskeletal:     Right knee: Swelling and crepitus present. No bony tenderness. Decreased range of motion. Tenderness present over the medial joint line and MCL. Abnormal meniscus.     Instability Tests: Anterior drawer test negative. Posterior drawer test negative. Anterior Lachman test negative. Medial McMurray test positive.  Skin:    General: Skin is warm and dry.  Neurological:     General: No focal deficit present.     Mental Status: She is alert. Mental status is at baseline.  Psychiatric:        Mood and Affect: Mood normal.        Behavior: Behavior normal.     No results found for any visits on 09/30/23.      Assessment & Plan:   1. Acute pain of right knee (Primary): Concern for acute meniscal injury. Will send for xray and prescribe Naproxen  to take twice daily x 14 days for pain. Can use ice and heat, topical medications as well. Needs an MRI to assess medial meniscus, will order today as well.  - naproxen  (NAPROSYN ) 500 MG tablet; Take 1 tablet (500 mg total) by mouth 2 (two) times  daily with a meal for 14 days.  Dispense: 28 tablet; Refill: 0 - DG Knee Complete 4 Views Right; Future - MR Knee Right Wo Contrast; Future   Return for already scheduled.  Sharyle Fischer, DO

## 2023-10-11 ENCOUNTER — Other Ambulatory Visit: Payer: Self-pay | Admitting: Internal Medicine

## 2023-10-11 DIAGNOSIS — M25561 Pain in right knee: Secondary | ICD-10-CM

## 2023-10-12 NOTE — Telephone Encounter (Signed)
Requested medication (s) are due for refill today: yes  Requested medication (s) are on the active medication list: yes  Last refill:  09/30/23  Future visit scheduled: yes  Notes to clinic:  routing for review, last refilled for a short supply, should patient continue to take?     Requested Prescriptions  Pending Prescriptions Disp Refills   naproxen (NAPROSYN) 500 MG tablet [Pharmacy Med Name: NAPROXEN 500MG  TABLETS] 28 tablet 0    Sig: TAKE 1 TABLET(500 MG) BY MOUTH TWICE DAILY WITH A MEAL FOR 14 DAYS     Analgesics:  NSAIDS Failed - 10/12/2023  8:26 AM      Failed - Manual Review: Labs are only required if the patient has taken medication for more than 8 weeks.      Failed - Cr in normal range and within 360 days    Creatinine, Ser  Date Value Ref Range Status  03/17/2023 1.02 (H) 0.57 - 1.00 mg/dL Final         Passed - HGB in normal range and within 360 days    Hemoglobin  Date Value Ref Range Status  03/17/2023 14.1 11.1 - 15.9 g/dL Final         Passed - PLT in normal range and within 360 days    Platelets  Date Value Ref Range Status  03/17/2023 321 150 - 450 x10E3/uL Final         Passed - HCT in normal range and within 360 days    Hematocrit  Date Value Ref Range Status  03/17/2023 41.9 34.0 - 46.6 % Final         Passed - eGFR is 30 or above and within 360 days    GFR calc Af Amer  Date Value Ref Range Status  05/22/2020 >60 >60 mL/min Final   GFR calc non Af Amer  Date Value Ref Range Status  05/22/2020 >60 >60 mL/min Final   GFR  Date Value Ref Range Status  05/11/2011 84.62 >60.00 mL/min Final   eGFR  Date Value Ref Range Status  03/17/2023 64 >59 mL/min/1.73 Final         Passed - Patient is not pregnant      Passed - Valid encounter within last 12 months    Recent Outpatient Visits           1 month ago Essential hypertension   [facility] Margarita Mail, DO   7 months ago Essential hypertension    [facility] Margarita Mail, DO   11 months ago Anxiety   [facility] Margarita Mail, DO   1 year ago Nausea and vomiting, unspecified vomiting type   [facility] Margarita Mail, DO   1 year ago Nausea and vomiting, unspecified vomiting type   [facility] Margarita Mail, DO       Future Appointments             In 4 months Margarita Mail, DO [facility], [facility]

## 2023-11-03 ENCOUNTER — Encounter: Payer: Self-pay | Admitting: Internal Medicine

## 2023-11-04 ENCOUNTER — Other Ambulatory Visit: Payer: Self-pay | Admitting: Internal Medicine

## 2023-11-04 DIAGNOSIS — S83261A Peripheral tear of lateral meniscus, current injury, right knee, initial encounter: Secondary | ICD-10-CM

## 2023-11-23 ENCOUNTER — Other Ambulatory Visit: Payer: Self-pay | Admitting: Emergency Medicine

## 2023-11-23 DIAGNOSIS — M5431 Sciatica, right side: Secondary | ICD-10-CM

## 2023-11-29 ENCOUNTER — Ambulatory Visit: Admitting: Orthopaedic Surgery

## 2023-11-29 DIAGNOSIS — G8929 Other chronic pain: Secondary | ICD-10-CM

## 2023-11-29 DIAGNOSIS — M25561 Pain in right knee: Secondary | ICD-10-CM | POA: Diagnosis not present

## 2023-11-29 MED ORDER — METHYLPREDNISOLONE ACETATE 40 MG/ML IJ SUSP
40.0000 mg | INTRAMUSCULAR | Status: AC | PRN
  Administered 2023-11-29: 40 mg via INTRA_ARTICULAR

## 2023-11-29 MED ORDER — LIDOCAINE HCL 1 % IJ SOLN
3.0000 mL | INTRAMUSCULAR | Status: AC | PRN
  Administered 2023-11-29: 3 mL

## 2023-11-29 NOTE — Progress Notes (Signed)
 The patient is a 58 year old that I am seeing for the first time.  She is sent to [place] from Dr. Margarita Mail after having a MRI of her right knee showing a lateral meniscal tear.  The patient is trying to be active.  She has been working out a lot since last year.  After she started losing weight and working on a regular basis her knee pain went away.  She has had some pain for a while now.  However earlier this year she started to have more pain in that right knee.  She denies any locking catching but does feel like the knee may be unstable to her.  Plain films of her knee were negative for any significant arthritic findings.  She was then sent for MRI which shows a lateral meniscal tear that is degenerative in nature but there is also an acute component with a root tear and extrusion of the meniscal fragments.  There is only mild thinning of the articular cartilage throughout her knee except for the patellofemoral joint where there is a low bit more arthritis underneath the patella.  The ACL PCL are intact and the lateral meniscus is intact.  I did review the MRI findings with her.  Examination of her right knee clinically shows that that right knee looks bigger than the left knee in terms of effusion and swelling.  She does have posterior lateral and lateral joint space pain to palpation and some patellofemoral crepitation.  There is no malalignment though.  I did lay her in a supine position and try to aspirate fluid off her knee but was surprisingly unsuccessful.  However her knee was not warm and the MRI showed only a small effusion.  I then placed a steroid injection in her right knee which she tolerated very well.  I am fine with her getting back to the gym and getting on the elliptical or exercise bike.  We would like to see her back in 2 weeks to see how she is done from this steroid injection.  I thought about the possibility of an arthroscopic intervention to address the lateral meniscal tear  but would like to see how with some more conservative treatment helps with getting back to her exercise routine and quad strengthening exercises for the next few weeks.    Procedure Note  Patient: Rachael Russell             Date of Birth: 11-10-1965           MRN: [id number]             Visit Date: 11/29/2023  Procedures: Visit Diagnoses:  1. Chronic pain of right knee     Large Joint Inj: R knee on 11/29/2023 1:42 PM Indications: diagnostic evaluation and pain Details: 22 G 1.5 in needle, superolateral approach  Arthrogram: No  Medications: 3 mL lidocaine 1 %; 40 mg methylPREDNISolone acetate 40 MG/ML Outcome: tolerated well, no immediate complications Procedure, treatment alternatives, risks and benefits explained, specific risks discussed. Consent was given by the patient. Immediately prior to procedure a time out was called to verify the correct patient, procedure, equipment, support staff and site/side marked as required. Patient was prepped and draped in the usual sterile fashion.

## 2023-12-16 ENCOUNTER — Ambulatory Visit: Admitting: Orthopaedic Surgery

## 2023-12-16 ENCOUNTER — Encounter: Payer: Self-pay | Admitting: Orthopaedic Surgery

## 2023-12-16 ENCOUNTER — Telehealth: Payer: Self-pay

## 2023-12-16 DIAGNOSIS — G8929 Other chronic pain: Secondary | ICD-10-CM

## 2023-12-16 DIAGNOSIS — M25561 Pain in right knee: Secondary | ICD-10-CM | POA: Diagnosis not present

## 2023-12-16 NOTE — Telephone Encounter (Signed)
Auth needed for right knee gel  

## 2023-12-16 NOTE — Progress Notes (Signed)
 The patient is a 58 year old female who returns for follow-up after I saw her 2 weeks ago and placed a steroid injection in her right knee.  She had a MRI of the knee showing some mild thinning of the cartilage in the medial and lateral aspects of the knee but the patellofemoral joint had severe cartilage thinning.  There was also a lateral meniscal tear.  Most of her pain has been on the medial aspect of the knee.  She said the steroid helped her quite a bit and she is 80% better.  She still having some medial pain and I think a lot of the pain she is having is more related to arthritis than the meniscal tear.  She said the knee feels more stable and she has no issues with stairs.  Examination of her right knee shows no warmth.  There is medial joint line tenderness but a negative McMurray's and.  She has good range of motion of the knee.  There is no significant effusion on my exam today.  She is a perfect candidate for hyaluronic acid to treat the arthritic pain of her knee and she agrees with trying this given the failure of other conservative treatment measures.  Will see if we get this ordered for her right knee.  This patient is diagnosed with osteoarthritis of the knee(s).    Radiographs show evidence of joint space narrowing, osteophytes, subchondral sclerosis and/or subchondral cysts.  This patient has knee pain which interferes with functional and activities of daily living.    This patient has experienced inadequate response, adverse effects and/or intolerance with conservative treatments such as acetaminophen , NSAIDS, topical creams, physical therapy or regular exercise, knee bracing and/or weight loss.   This patient has experienced inadequate response or has a contraindication to intra articular steroid injections for at least 3 months.   This patient is not scheduled to have a total knee replacement within 6 months of starting treatment with viscosupplementation.

## 2023-12-29 NOTE — Telephone Encounter (Signed)
 VOB submitted for Durolane, right knee PA required

## 2024-01-06 ENCOUNTER — Telehealth: Payer: Self-pay

## 2024-01-06 NOTE — Telephone Encounter (Signed)
 PA has been faxed to [employer] at [phone] for Durolane, right knee PA pending

## 2024-02-07 ENCOUNTER — Encounter: Payer: Self-pay | Admitting: Internal Medicine

## 2024-02-07 ENCOUNTER — Ambulatory Visit: Admitting: Internal Medicine

## 2024-02-07 ENCOUNTER — Other Ambulatory Visit: Payer: Self-pay

## 2024-02-07 VITALS — BP 124/86 | HR 71 | Temp 97.7°F | Resp 16 | Ht 64.0 in | Wt 179.4 lb

## 2024-02-07 DIAGNOSIS — N959 Unspecified menopausal and perimenopausal disorder: Secondary | ICD-10-CM | POA: Diagnosis not present

## 2024-02-07 DIAGNOSIS — K2100 Gastro-esophageal reflux disease with esophagitis, without bleeding: Secondary | ICD-10-CM | POA: Diagnosis not present

## 2024-02-07 DIAGNOSIS — M7989 Other specified soft tissue disorders: Secondary | ICD-10-CM | POA: Diagnosis not present

## 2024-02-07 MED ORDER — PANTOPRAZOLE SODIUM 40 MG PO TBEC: 40.0000 mg | DELAYED_RELEASE_TABLET | Freq: Every day | ORAL | 1 refills | Status: DC

## 2024-02-07 NOTE — Progress Notes (Signed)
 Acute Office Visit  Subjective:     Patient ID: Rachael Russell, female    DOB: 1965-09-07, 58 y.o.   MRN: [id number]  Chief Complaint  Patient presents with   Cyst    Behind right knee   Gastroesophageal Reflux   Menopause    HPI Patient is in today for acid reflux and a few other things today.   Discussed the use of AI scribe software for clinical note transcription with the patient, who gave verbal consent to proceed.  History of Present Illness Rachael Russell is a 58 year old female who presents with nausea and acid reflux symptoms.  She experiences nausea and acid reflux, particularly after consuming foods like lemons, beef, and chocolate. A recent episode involved waking up at 3 AM with a burning sensation in her throat after eating steak and chocolate cake, feeling as though she had vomited, but the contents went back down, causing discomfort. She has been taking Prilosec as needed for these symptoms but did not take it on the night of the incident. Despite a noted allergy to pantoprazole  and Prilosec in her chart, she has been using over-the-counter Prilosec without issues.  Her stools are often loose and sometimes sticky, but not bloody or dark. She has been avoiding acidic foods like orange juice, lemons, and limes, and has stopped drinking carbonated beverages and eating beef, suspecting these might exacerbate her symptoms.  She experiences frequent headaches, which she attributes to dehydration, and has been trying to increase her water intake. A recent move has disrupted her routine, affecting her hydration habits.   Review of Systems  Gastrointestinal:  Positive for heartburn and nausea. Negative for abdominal pain and vomiting.  Neurological:  Positive for headaches.        Objective:    BP 124/86 (Cuff Size: Large)   Pulse 71   Temp 97.7 F (36.5 C) (Oral)   Resp 16   Ht 5' 4 (1.626 m)   Wt 179 lb 6.4 oz (81.4 kg)   LMP 04/25/2019   SpO2 98%   BMI  30.79 kg/m  BP Readings from Last 3 Encounters:  02/07/24 124/86  09/30/23 126/82  09/07/23 124/76   Wt Readings from Last 3 Encounters:  02/07/24 179 lb 6.4 oz (81.4 kg)  09/30/23 175 lb 6.4 oz (79.6 kg)  09/07/23 174 lb 3.2 oz (79 kg)      Physical Exam Constitutional:      Appearance: Normal appearance.  HENT:     Head: Normocephalic and atraumatic.   Eyes:     Conjunctiva/sclera: Conjunctivae normal.    Cardiovascular:     Rate and Rhythm: Normal rate and regular rhythm.  Pulmonary:     Effort: Pulmonary effort is normal.     Breath sounds: Normal breath sounds.   Skin:    General: Skin is warm and dry.     Comments: Small hard mass behind right knee   Neurological:     General: No focal deficit present.     Mental Status: She is alert. Mental status is at baseline.   Psychiatric:        Mood and Affect: Mood normal.        Behavior: Behavior normal.     No results found for any visits on 02/07/24.      Assessment & Plan:   Assessment & Plan Gastroesophageal Reflux Disease (GERD) Experiences acid reflux with certain foods, leading to nocturnal regurgitation and throat burning. Biopsy showed inflammation  without Barrett's esophagus or ulcers. Discussed daily acid suppressant use to improve quality of life and reduce esophageal cancer risk. Explained potential long-term PPI side effects and dietary modifications. - Order H. pylori breath test. - Prescribe pantoprazole  40 mg daily for 3 months. - Schedule follow-up appointment in 3 months to assess response to treatment. - Educate on dietary modifications to avoid acidic and high-fat foods.  Postmenopausal Symptoms Reports cognitive decline, mood changes, and severe hot flashes. Risk assessment shows lifetime breast cancer risk above 20%. Discussed HRT benefits and risks, emphasizing need for gynecological consultation. - Refer to gynecology for evaluation and discussion of hormone replacement  therapy.  Subcutaneous Mass Hard mass behind the knee, possibly a lipoma or dermal fibroma. Not fluid-filled, unlike a Baker's cyst. Discussed ultrasound to determine mass Rachael. - Order ultrasound of the mass to determine its Rachael.  - pantoprazole  (PROTONIX ) 40 MG tablet; Take 1 tablet (40 mg total) by mouth daily.  Dispense: 90 tablet; Refill: 1 - H. pylori breath test - Ambulatory referral to Gynecology - US  RT LOWER EXTREM LTD SOFT TISSUE NON VASCULAR; Future   Return in about 3 months (around 05/09/2024).  Rockney Cid, DO

## 2024-02-09 ENCOUNTER — Ambulatory Visit: Payer: Self-pay | Admitting: Internal Medicine

## 2024-02-09 LAB — H. PYLORI BREATH TEST: H pylori Breath Test: NEGATIVE

## 2024-02-11 ENCOUNTER — Ambulatory Visit

## 2024-02-11 DIAGNOSIS — M7989 Other specified soft tissue disorders: Secondary | ICD-10-CM | POA: Diagnosis not present

## 2024-02-22 ENCOUNTER — Other Ambulatory Visit: Payer: Self-pay | Admitting: Emergency Medicine

## 2024-02-22 DIAGNOSIS — M5431 Sciatica, right side: Secondary | ICD-10-CM

## 2024-02-22 DIAGNOSIS — R112 Nausea with vomiting, unspecified: Secondary | ICD-10-CM

## 2024-02-22 MED ORDER — CYCLOBENZAPRINE HCL 5 MG PO TABS: 5.0000 mg | ORAL_TABLET | Freq: Three times a day (TID) | ORAL | 1 refills | Status: DC | PRN

## 2024-02-22 MED ORDER — ONDANSETRON HCL 4 MG PO TABS: 4.0000 mg | ORAL_TABLET | ORAL | 0 refills | Status: AC | PRN

## 2024-02-22 NOTE — Progress Notes (Signed)
 Dr. Bernardo notified. Order for medication sent for refill

## 2024-02-23 ENCOUNTER — Telehealth: Payer: Self-pay

## 2024-02-23 NOTE — Telephone Encounter (Signed)
 Called [employer] to check status of PA due to never receiving a response and was advised per Jonell that PA needed to be refaxed.  PA refaxed to [employer] for Durolane, right knee.

## 2024-03-07 ENCOUNTER — Encounter: Payer: Self-pay | Admitting: Internal Medicine

## 2024-03-07 ENCOUNTER — Other Ambulatory Visit: Payer: Self-pay

## 2024-03-07 ENCOUNTER — Ambulatory Visit (INDEPENDENT_AMBULATORY_CARE_PROVIDER_SITE_OTHER): Payer: Self-pay | Admitting: Internal Medicine

## 2024-03-07 ENCOUNTER — Other Ambulatory Visit (HOSPITAL_COMMUNITY)
Admission: RE | Admit: 2024-03-07 | Discharge: 2024-03-07 | Disposition: A | Discharge status: Home / Self Care | Source: Ambulatory Visit | Attending: Internal Medicine | Admitting: Internal Medicine

## 2024-03-07 VITALS — BP 120/70 | HR 77 | Temp 98.1°F | Resp 16 | Ht 64.0 in | Wt 179.3 lb

## 2024-03-07 DIAGNOSIS — E7800 Pure hypercholesterolemia, unspecified: Secondary | ICD-10-CM

## 2024-03-07 DIAGNOSIS — Z124 Encounter for screening for malignant neoplasm of cervix: Secondary | ICD-10-CM | POA: Insufficient documentation

## 2024-03-07 DIAGNOSIS — Z0000 Encounter for general adult medical examination without abnormal findings: Secondary | ICD-10-CM | POA: Diagnosis present

## 2024-03-07 DIAGNOSIS — N941 Unspecified dyspareunia: Secondary | ICD-10-CM

## 2024-03-07 DIAGNOSIS — R7303 Prediabetes: Secondary | ICD-10-CM | POA: Diagnosis not present

## 2024-03-07 DIAGNOSIS — I10 Essential (primary) hypertension: Secondary | ICD-10-CM

## 2024-03-07 DIAGNOSIS — E559 Vitamin D deficiency, unspecified: Secondary | ICD-10-CM

## 2024-03-07 MED ORDER — LISINOPRIL 40 MG PO TABS
40.0000 mg | ORAL_TABLET | Freq: Every day | ORAL | 1 refills | Status: AC
Start: 2024-03-07 — End: ?

## 2024-03-07 MED ORDER — HYDROCHLOROTHIAZIDE 25 MG PO TABS: 25.0000 mg | ORAL_TABLET | Freq: Every day | ORAL | 1 refills | Status: DC

## 2024-03-07 MED ORDER — ESTRADIOL 0.1 MG/GM VA CREA: 1.0000 | TOPICAL_CREAM | Freq: Every day | VAGINAL | 1 refills | Status: AC

## 2024-03-07 MED ORDER — ATORVASTATIN CALCIUM 10 MG PO TABS: 10.0000 mg | ORAL_TABLET | Freq: Every day | ORAL | 1 refills | Status: AC

## 2024-03-07 NOTE — Progress Notes (Signed)
 Name: Rachael Russell   MRN: [id number]    DOB: 1965-10-02   Date:03/07/2024       Progress Note  Subjective  Chief Complaint  Chief Complaint  Patient presents with   Annual Exam    HPI  Patient presents for annual CPE.  Discussed the use of AI scribe software for clinical note transcription with the patient, who gave verbal consent to proceed.  History of Present Illness Rachael Russell is a 58 year old female who presents for a routine physical exam and Pap smear.  Her last Pap smear was three years ago. She is up to date with her mammogram and had a colonoscopy in 2018.  She takes lisinopril , hydrochlorothiazide , and Lipitor. She requests refills for lisinopril  and hydrochlorothiazide  to be sent to [facility]. She has an excess supply of amlodipine  and does not need a refill.  She takes vitamin D  supplements, with levels last checked two years ago at 44. No concerns about STD screening.   Diet: Regular Exercise: 3 days 60 minutes  Last Eye Exam: completed Last Dental Exam: will schedule  Flowsheet Row Office Visit from 03/07/2024 in [facility]  AUDIT-C Score 0   Depression: Phq 9 is  negative    03/07/2024   10:21 AM 02/07/2024    1:57 PM 09/07/2023   10:53 AM 03/04/2023   11:46 AM 11/05/2022   11:13 AM  Depression screen PHQ 2/9  Decreased Interest 0 0 0 0 0  Down, Depressed, Hopeless 0 0 0 0 0  PHQ - 2 Score 0 0 0 0 0  Altered sleeping    0 1  Tired, decreased energy    0 1  Change in appetite    0 1  Feeling bad or failure about yourself     0 0  Trouble concentrating    0 0  Moving slowly or fidgety/restless    0 0  Suicidal thoughts    0 0  PHQ-9 Score    0 3  Difficult doing work/chores    Not difficult at all Not difficult at all   Hypertension: BP Readings from Last 3 Encounters:  03/07/24 120/70  02/07/24 124/86  09/30/23 126/82   Obesity: Wt Readings from Last 3 Encounters:  03/07/24 179 lb 4.8 oz (81.3 kg)  02/07/24 179  lb 6.4 oz (81.4 kg)  09/30/23 175 lb 6.4 oz (79.6 kg)   BMI Readings from Last 3 Encounters:  03/07/24 30.78 kg/m  02/07/24 30.79 kg/m  09/30/23 30.11 kg/m     Vaccines: reviewed with the patient.   Hep C Screening: completed STD testing and prevention (HIV/chl/gon/syphilis): no concerns Intimate partner violence: negative screen  Sexual History :active -having some pain with the initiation of sexual intercourse  Menstrual History/LMP/Abnormal Bleeding: Menopausal  Discussed importance of follow up if any post-menopausal bleeding: yes  Incontinence Symptoms: negative for symptoms   Breast cancer:  - Last Mammogram: 08/20/2023  Osteoporosis Prevention : Discussed high calcium  and vitamin D  supplementation, weight bearing exercises Bone density :not applicable   Cervical cancer screening: performing today  Skin cancer: Discussed monitoring for atypical lesions  Colorectal cancer: 09/24/2016  colonoscopy  Lung cancer:  Low Dose CT Chest recommended if Age 42-80 years, 20 pack-year currently smoking OR have quit w/in 15years. Patient does not qualify for screen   ECG: 05/22/20   Patient Active Problem List   Diagnosis Date Noted   Nausea and vomiting 09/10/2022   Bunion, right foot 04/10/2021  Depression 03/24/2021   At risk for diabetes mellitus 12/17/2020   Gastroesophageal reflux disease 11/19/2020   At risk for impaired metabolic function 11/19/2020   Menopause 11/14/2020   Prediabetes 11/05/2020   Other hyperlipidemia 11/05/2020   At risk for deficient intake of food 11/05/2020   Class 1 obesity with serious comorbidity and body mass index (BMI) of 31.0 to 31.9 in adult 10/22/2020   Pain in right wrist 09/12/2020   Class 1 obesity due to excess calories with body mass index (BMI) of 33.0 to 33.9 in adult 05/08/2020   Screening mammogram, encounter for 04/25/2019   Dyspnea 10/31/2018   Low back pain 08/08/2018   Genetic testing 12/01/2017   Family history of  breast cancer    Family history of prostate cancer    Papilloma of left breast 07/09/2017   Pedal edema 02/23/2017   Vitamin D  deficiency 04/26/2015   Fatigue 11/06/2014   Hemorrhoids, external 10/13/2013   Cervical disc disorder with radiculopathy of cervical region 09/15/2013   History of herpes zoster 03/07/2013   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   Routine general medical examination at a health care facility 05/11/2011   Allergic rhinitis 12/19/2010   Fibrocystic breast disease 12/19/2010   GERD 04/14/2010   SINUSITIS, CHRONIC FRONTAL 04/22/2007   Hyperlipidemia 12/21/2006   Essential hypertension 12/21/2006    Past Surgical History:  Procedure Laterality Date   BREAST DUCTAL SYSTEM EXCISION Left 11/18/2012   Procedure: EXCISION DUCTAL SYSTEM BREAST;  Surgeon: Elon CHRISTELLA Pacini, MD;  Location: [facility];  Service: General;  Laterality: Left;   BREAST LUMPECTOMY WITH NEEDLE LOCALIZATION Left 11/18/2012   Procedure: excise ductal system left breast. subarealar. left partial mastectomy with radiographic guidance;  Surgeon: Elon CHRISTELLA Pacini, MD;  Location: [facility];  Service: General;  Laterality: Left;  excise ductal system left breast. subarealar. left partial mastectomy with radiographic guidance   BREAST LUMPECTOMY WITH NEEDLE LOCALIZATION Left 03/27/2015   Procedure: LEFT BREAST LUMPECTOMY  WITH NEEDLE LOCALIZATION TIMES TWO;  Surgeon: Elon Pacini, MD;  Location: [facility];  Service: General;  Laterality: Left;   BREAST LUMPECTOMY WITH RADIOACTIVE SEED LOCALIZATION Left 07/09/2017   Procedure: LEFT BREAST LUMPECTOMY WITH RADIOACTIVE SEED LOCALIZATION;  Surgeon: Pacini Elon, MD;  Location: [facility];  Service: General;  Laterality: Left;  ERAS PATHWAY   BUNIONECTOMY Right    CESAREAN SECTION  [phone]   DILATION AND CURETTAGE OF UTERUS  1998   DILITATION & CURRETTAGE/HYSTROSCOPY WITH NOVASURE ABLATION N/A 08/05/2016    Procedure: DILATATION & CURETTAGE/HYSTEROSCOPY WITH NOVASURE ABLATION;  Surgeon: Marie-Lyne Lavoie, MD;  Location: [facility];  Service: Gynecology;  Laterality: N/A;   ESOPHAGOGASTRODUODENOSCOPY (EGD) WITH PROPOFOL  N/A 09/10/2022   Procedure: ESOPHAGOGASTRODUODENOSCOPY (EGD) WITH PROPOFOL ;  Surgeon: Unk Corinn Skiff, MD;  Location: [facility];  Service: Endoscopy;  Laterality: N/A;   TONSILLECTOMY  age 78   UMBILICAL HERNIA REPAIR  2006    Family History  Problem Relation Age of Onset   Hypertension Mother    Arthritis Mother        osteoarthritis   Breast cancer Mother 36   Hyperlipidemia Mother    Cancer Mother    Obesity Mother    Hypertension Father    Arthritis Father        osteoarthritis   Hyperlipidemia Father    Breast cancer Maternal Grandmother 73       bilateral breast cancer   Breast cancer Paternal Aunt 73  bilateral breast cancer   Prostate cancer Paternal Uncle    Tuberculosis Paternal Grandmother    Prostate cancer Paternal Uncle     Social History   Socioeconomic History   Marital status: Married    Spouse name: Rheya Minogue   Number of children: 2   Years of education: Not on file   Highest education level: Bachelor's degree (e.g., BA, AB, BS)  Occupational History   Occupation: Dietitian 2    Comment: Development worker, community- foster care  Tobacco Use   Smoking status: Never   Smokeless tobacco: Never  Vaping Use   Vaping status: Never Used  Substance and Sexual Activity   Alcohol use: Not Currently   Drug use: No   Sexual activity: Yes    Birth control/protection: Other-see comments, Surgical    Comment: vasectomy  Other Topics Concern   Not on file  Social History Narrative   Not on file   Social Drivers of Health   Financial Resource Strain: Low Risk  (03/07/2024)   Overall Financial Resource Strain (CARDIA)    Difficulty of Paying Living Expenses: Not hard at all  Food Insecurity: No Food  Insecurity (03/07/2024)   Hunger Vital Sign    Worried About Running Out of Food in the Last Year: Never true    Ran Out of Food in the Last Year: Never true  Transportation Needs: No Transportation Needs (03/07/2024)   PRAPARE - Administrator, Civil Service (Medical): No    Lack of Transportation (Non-Medical): No  Physical Activity: Sufficiently Active (03/07/2024)   Exercise Vital Sign    Days of Exercise per Week: 3 days    Minutes of Exercise per Session: 60 min  Stress: Stress Concern Present (03/07/2024)   [employer] of Occupational Health - Occupational Stress Questionnaire    Feeling of Stress: To some extent  Social Connections: Socially Integrated (03/07/2024)   Social Connection and Isolation Panel    Frequency of Communication with Friends and Family: More than three times a week    Frequency of Social Gatherings with Friends and Family: Once a week    Attends Religious Services: More than 4 times per year    Active Member of [employer] or Organizations: Yes    Attends Engineer, structural: More than 4 times per year    Marital Status: Married  Catering manager Violence: Not At Risk (03/07/2024)   Humiliation, Afraid, Rape, and Kick questionnaire    Fear of Current or Ex-Partner: No    Emotionally Abused: No    Physically Abused: No    Sexually Abused: No     Current Outpatient Medications:    amLODipine  (NORVASC ) 5 MG tablet, Take 0.5 tablets (2.5 mg total) by mouth daily., Disp: 90 tablet, Rfl: 1   atorvastatin  (LIPITOR) 10 MG tablet, Take 1 tablet (10 mg total) by mouth daily., Disp: 90 tablet, Rfl: 1   Cholecalciferol 2000 units TABS, Take 1 tablet by mouth daily., Disp: , Rfl:    cyclobenzaprine  (FLEXERIL ) 5 MG tablet, Take 1 tablet (5 mg total) by mouth 3 (three) times daily as needed for muscle spasms., Disp: 30 tablet, Rfl: 1   hydrochlorothiazide  (HYDRODIURIL ) 25 MG tablet, Take 1 tablet (25 mg total) by mouth daily., Disp: 90 tablet, Rfl:  1   lisinopril  (ZESTRIL ) 40 MG tablet, Take 1 tablet (40 mg total) by mouth daily., Disp: 90 tablet, Rfl: 1   Multiple Vitamin (MULTIVITAMIN) capsule, Take 1 capsule by mouth daily., Disp: ,  Rfl:    ondansetron  (ZOFRAN ) 4 MG tablet, Take 1 tablet (4 mg total) by mouth as needed for nausea or vomiting., Disp: 20 tablet, Rfl: 0   pantoprazole  (PROTONIX ) 40 MG tablet, Take 1 tablet (40 mg total) by mouth daily., Disp: 90 tablet, Rfl: 1   triamcinolone  cream (KENALOG ) 0.1 %, Apply 1 Application topically 2 (two) times daily., Disp: 30 g, Rfl: 0  Allergies  Allergen Reactions   Ferrous Sulfate     constipation   Pantoprazole  Sodium     Headache   Penicillins Other (See Comments)    Unknown childhood reaction   Potassium-Containing Compounds Other (See Comments)    headaches   Prilosec [Omeprazole Magnesium]     headache   Chlorhexidine  Gluconate Rash     Review of Systems  All other systems reviewed and are negative.   Objective  Vitals:   03/07/24 1023  BP: 120/70  Pulse: 77  Resp: 16  Temp: 98.1 F (36.7 C)  TempSrc: Oral  SpO2: 98%  Weight: 179 lb 4.8 oz (81.3 kg)  Height: 5' 4 (1.626 m)    Body mass index is 30.78 kg/m.  Physical Exam Exam conducted with a chaperone present.  Constitutional:      Appearance: Normal appearance.  HENT:     Head: Normocephalic and atraumatic.  Eyes:     Conjunctiva/sclera: Conjunctivae normal.  Cardiovascular:     Rate and Rhythm: Normal rate and regular rhythm.  Pulmonary:     Effort: Pulmonary effort is normal.     Breath sounds: Normal breath sounds.  Chest:  Breasts:    Right: Normal.     Left: Normal.  Genitourinary:    Comments: External genitalia within normal limits.  Vaginal mucosa pink, moist, normal rugae.  Nonfriable cervix without lesions, no discharge or bleeding noted on speculum exam. Some discomfort with Pap.   Lymphadenopathy:     Upper Body:     Right upper body: No supraclavicular, axillary or  pectoral adenopathy.     Left upper body: No supraclavicular, axillary or pectoral adenopathy.  Skin:    General: Skin is warm and dry.  Neurological:     General: No focal deficit present.     Mental Status: She is alert. Mental status is at baseline.  Psychiatric:        Mood and Affect: Mood normal.        Behavior: Behavior normal.     Last CBC Lab Results  Component Value Date   WBC 8.1 03/17/2023   HGB 14.1 03/17/2023   HCT 41.9 03/17/2023   MCV 90 03/17/2023   MCH 30.3 03/17/2023   RDW 12.0 03/17/2023   PLT 321 03/17/2023   Last metabolic panel Lab Results  Component Value Date   GLUCOSE 82 03/17/2023   NA 141 03/17/2023   K 4.3 03/17/2023   CL 103 03/17/2023   CO2 24 03/17/2023   BUN 20 03/17/2023   CREATININE 1.02 (H) 03/17/2023   EGFR 64 03/17/2023   CALCIUM  9.8 03/17/2023   PHOS 3.6 10/28/2018   PROT 6.9 03/17/2023   ALBUMIN 4.3 03/17/2023   LABGLOB 2.6 03/17/2023   AGRATIO 1.8 07/09/2022   BILITOT 0.3 03/17/2023   ALKPHOS 101 03/17/2023   AST 33 03/17/2023   ALT 33 (H) 03/17/2023   ANIONGAP 14 05/22/2020   Last lipids Lab Results  Component Value Date   CHOL 179 03/17/2023   HDL 51 03/17/2023   LDLCALC 118 (H) 03/17/2023   LDLDIRECT  149.9 05/11/2011   TRIG 50 03/17/2023   CHOLHDL 3.5 03/17/2023   Last hemoglobin A1c Lab Results  Component Value Date   HGBA1C 6.1 (A) 09/07/2023   Last thyroid functions Lab Results  Component Value Date   TSH 2.410 04/04/2021   T3TOTAL 135 10/22/2020   Last vitamin D  Lab Results  Component Value Date   VD25OH 54.5 01/23/2022   Last vitamin B12 and Folate Lab Results  Component Value Date   VITAMINB12 768 10/22/2020   FOLATE 10.4 10/22/2020      Assessment & Plan  Assessment & Plan Hypertension Hypertension managed with lisinopril , hydrochlorothiazide , and amlodipine . - Send prescriptions for lisinopril  and hydrochlorothiazide  to [facility].  Hyperlipidemia Hyperlipidemia managed with  Lipitor. - Send prescription for Lipitor to [facility].  General Health Maintenance Pap smear due, performed today. Tetanus, shingles vaccines, and mammogram are current. Colonoscopy due in 2028. Vitamin D  levels adequate two years ago. - Perform Pap smear today. - Check vitamin D  levels in labs. - Plan bone density screening in the next couple of years. - Encourage weight-bearing exercise and vitamin D  supplementation for bone health. - Some pain with Pap - patient also states she does have some pain with the initiation of sexual intercourse. She has tried multiple lubricants without relief. We did discuss risk vs benefit of topical vaginal estrogen cream, patient willing to try until next appointment to see if her symptoms improve with this treatment.   - CBC w/Diff/Platelet - Comprehensive Metabolic Panel (CMET) - HgB A1c - Lipid Profile - atorvastatin  (LIPITOR) 10 MG tablet; Take 1 tablet (10 mg total) by mouth daily.  Dispense: 90 tablet; Refill: 1 - Cytology - PAP - hydrochlorothiazide  (HYDRODIURIL ) 25 MG tablet; Take 1 tablet (25 mg total) by mouth daily.  Dispense: 90 tablet; Refill: 1 - lisinopril  (ZESTRIL ) 40 MG tablet; Take 1 tablet (40 mg total) by mouth daily.  Dispense: 90 tablet; Refill: 1 - Vitamin D  (25 hydroxy) - estradiol  (ESTRACE  VAGINAL) 0.1 MG/GM vaginal cream; Place 1 Applicatorful vaginally at bedtime.  Dispense: 42.5 g; Refill: 1     -USPSTF grade A and B recommendations reviewed with patient; age-appropriate recommendations, preventive care, screening tests, etc discussed and encouraged; healthy living encouraged; see AVS for patient education given to patient -Discussed importance of 150 minutes of physical activity weekly, eat two servings of fish weekly, eat one serving of tree nuts ( cashews, pistachios, pecans, almonds.SABRA) every other day, eat 6 servings of fruit/vegetables daily and drink plenty of water and avoid sweet beverages.   -Reviewed Health Maintenance:  Yes.

## 2024-03-08 LAB — COMPREHENSIVE METABOLIC PANEL WITH GFR
ALT: 59 IU/L — ABNORMAL HIGH (ref 0–32)
AST: 43 IU/L — ABNORMAL HIGH (ref 0–40)
Albumin: 4.5 g/dL (ref 3.8–4.9)
Alkaline Phosphatase: 112 IU/L (ref 44–121)
BUN/Creatinine Ratio: 18 (ref 9–23)
BUN: 17 mg/dL (ref 6–24)
Bilirubin Total: 0.4 mg/dL (ref 0.0–1.2)
CO2: 22 mmol/L (ref 20–29)
Calcium: 9.6 mg/dL (ref 8.7–10.2)
Chloride: 104 mmol/L (ref 96–106)
Creatinine, Ser: 0.97 mg/dL (ref 0.57–1.00)
Globulin, Total: 2.2 g/dL (ref 1.5–4.5)
Glucose: 76 mg/dL (ref 70–99)
Potassium: 4.3 mmol/L (ref 3.5–5.2)
Sodium: 145 mmol/L — ABNORMAL HIGH (ref 134–144)
Total Protein: 6.7 g/dL (ref 6.0–8.5)
eGFR: 68 mL/min/1.73 (ref >59)

## 2024-03-08 LAB — LIPID PANEL
Chol/HDL Ratio: 3.7 ratio (ref 0.0–4.4)
Cholesterol, Total: 194 mg/dL (ref 100–199)
HDL: 53 mg/dL (ref >39)
LDL Chol Calc (NIH): 132 mg/dL — ABNORMAL HIGH (ref 0–99)
Triglycerides: 47 mg/dL (ref 0–149)
VLDL Cholesterol Cal: 9 mg/dL (ref 5–40)

## 2024-03-08 LAB — CBC WITH DIFFERENTIAL/PLATELET
Basophils Absolute: 0 x10E3/uL (ref 0.0–0.2)
Basos: 1 %
EOS (ABSOLUTE): 0.1 x10E3/uL (ref 0.0–0.4)
Eos: 1 %
Hematocrit: 43.2 % (ref 34.0–46.6)
Hemoglobin: 13.9 g/dL (ref 11.1–15.9)
Immature Grans (Abs): 0 x10E3/uL (ref 0.0–0.1)
Immature Granulocytes: 0 %
Lymphocytes Absolute: 2.2 x10E3/uL (ref 0.7–3.1)
Lymphs: 26 %
MCH: 30.7 pg (ref 26.6–33.0)
MCHC: 32.2 g/dL (ref 31.5–35.7)
MCV: 95 fL (ref 79–97)
Monocytes Absolute: 0.6 x10E3/uL (ref 0.1–0.9)
Monocytes: 7 %
Neutrophils Absolute: 5.6 x10E3/uL (ref 1.4–7.0)
Neutrophils: 65 %
Platelets: 314 x10E3/uL (ref 150–450)
RBC: 4.53 x10E6/uL (ref 3.77–5.28)
RDW: 12.3 % (ref 11.7–15.4)
WBC: 8.6 x10E3/uL (ref 3.4–10.8)

## 2024-03-08 LAB — HEMOGLOBIN A1C
Est. average glucose Bld gHb Est-mCnc: 114 mg/dL
Hgb A1c MFr Bld: 5.6 % (ref 4.8–5.6)

## 2024-03-08 LAB — VITAMIN D 25 HYDROXY (VIT D DEFICIENCY, FRACTURES): Vit D, 25-Hydroxy: 49.1 ng/mL (ref 30.0–100.0)

## 2024-03-09 ENCOUNTER — Ambulatory Visit: Payer: Self-pay | Admitting: Internal Medicine

## 2024-03-09 LAB — CYTOLOGY - PAP
Adequacy: ABSENT
Comment: NEGATIVE
Diagnosis: NEGATIVE
High risk HPV: NEGATIVE

## 2024-04-13 ENCOUNTER — Other Ambulatory Visit: Payer: Self-pay | Admitting: Internal Medicine

## 2024-04-13 DIAGNOSIS — K2100 Gastro-esophageal reflux disease with esophagitis, without bleeding: Secondary | ICD-10-CM

## 2024-04-13 NOTE — Telephone Encounter (Unsigned)
 Copied from CRM #[id number]. Topic: Clinical - Medication Refill >> Apr 13, 2024  1:33 PM Dedra B wrote: Medication: pantoprazole  (PROTONIX ) 40 MG tablet  Has the patient contacted their pharmacy? Yes (Agent: If yes, when and what did the pharmacy advise?) Pt was informed by pharmacy that she has 4 refills left but her insurance will no longer cover them at [facility] and she needs to transfer them to [facility]  This is the patient's preferred pharmacy:  [id number] - [place], [place] - [address] AT [facility] [address] [place] [address] Phone: [phone] Fax: [phone]   Is this the correct pharmacy for this prescription? Yes  Has the prescription been filled recently? No  Is the patient out of the medication? Yes. Take the last one today  Has the patient been seen for an appointment in the last year OR does the patient have an upcoming appointment? Yes  Can we respond through MyChart? Yes  Agent: Please be advised that Rx refills may take up to 3 business days. We ask that you follow-up with your pharmacy.

## 2024-04-14 MED ORDER — PANTOPRAZOLE SODIUM 40 MG PO TBEC: 40.0000 mg | DELAYED_RELEASE_TABLET | Freq: Every day | ORAL | 3 refills | Status: DC

## 2024-04-14 NOTE — Telephone Encounter (Signed)
 Requested Prescriptions  Pending Prescriptions Disp Refills   pantoprazole  (PROTONIX ) 40 MG tablet 90 tablet 3    Sig: Take 1 tablet (40 mg total) by mouth daily.     Gastroenterology: Proton Pump Inhibitors Passed - 04/14/2024  2:02 PM      Passed - Valid encounter within last 12 months    Recent Outpatient Visits           1 month ago Annual physical exam   [facility] Bernardo Fend, DO   2 months ago Gastroesophageal reflux disease with esophagitis, unspecified whether hemorrhage   [facility] Bernardo Fend, DO   6 months ago Acute pain of right knee   [facility] Bernardo Fend, DO       Future Appointments             In 2 weeks Bernardo Fend, DO [facility], [facility]

## 2024-05-02 ENCOUNTER — Ambulatory Visit: Admitting: Internal Medicine

## 2024-05-02 ENCOUNTER — Ambulatory Visit
Admission: RE | Admit: 2024-05-02 | Discharge: 2024-05-02 | Disposition: A | Discharge status: Home / Self Care | Source: Ambulatory Visit | Attending: Internal Medicine | Admitting: Internal Medicine

## 2024-05-02 ENCOUNTER — Other Ambulatory Visit: Payer: Self-pay

## 2024-05-02 ENCOUNTER — Ambulatory Visit
Admission: RE | Admit: 2024-05-02 | Discharge: 2024-05-02 | Disposition: A | Discharge status: Home / Self Care | Attending: Internal Medicine | Admitting: Internal Medicine

## 2024-05-02 ENCOUNTER — Encounter: Payer: Self-pay | Admitting: Internal Medicine

## 2024-05-02 VITALS — BP 130/72 | HR 81 | Temp 97.9°F | Resp 16 | Ht 64.0 in | Wt 182.1 lb

## 2024-05-02 DIAGNOSIS — M898X1 Other specified disorders of bone, shoulder: Secondary | ICD-10-CM | POA: Diagnosis not present

## 2024-05-02 DIAGNOSIS — K2100 Gastro-esophageal reflux disease with esophagitis, without bleeding: Secondary | ICD-10-CM

## 2024-05-02 DIAGNOSIS — N644 Mastodynia: Secondary | ICD-10-CM | POA: Diagnosis not present

## 2024-05-02 NOTE — Progress Notes (Signed)
 Acute Office Visit  Subjective:     Patient ID: Rachael Russell, female    DOB: 31-Aug-1965, 58 y.o.   MRN: [id number]  Chief Complaint  Patient presents with   Medical Management of Chronic Issues    HPI Patient is in today for follow up on acid reflux.   Discussed the use of AI scribe software for clinical note transcription with the patient, who gave verbal consent to proceed.  History of Present Illness Rachael Russell is a 58 year old female who presents with follow-up on acid reflux and new onset chest and back pain.  She stopped taking pantoprazole  this month after three months of use, preferring not to continue long-term. Since discontinuation, she has not experienced significant acid reflux symptoms, except once when bending over. She manages her symptoms with dietary changes, increased physical activity, and avoiding triggers such as chocolate, spicy foods, coffee, and carbonated beverages. She drinks probiotic tea at night to aid digestion.  She experiences new onset pain that began at church, starting in her back and radiating to her chest, causing difficulty breathing. Initially severe, the pain has become a lingering discomfort with an itchy sensation. She is concerned about the nature of this pain. She denies numbness, tingling, recent rash, or symptoms associated with shingles. Self-breast exams reveal no lumps.  Her father's recent diagnosis of stage four renal cancer is a significant source of stress. She is actively managing her stress and health amidst these family challenges.   Review of Systems  Gastrointestinal:  Negative for abdominal pain, heartburn, nausea and vomiting.  Musculoskeletal:  Positive for joint pain.        Objective:    BP 130/72 (Cuff Size: Large)   Pulse 81   Temp 97.9 F (36.6 C) (Oral)   Resp 16   Ht 5' 4 (1.626 m)   Wt 182 lb 1.6 oz (82.6 kg)   LMP 04/25/2019   SpO2 98%   BMI 31.26 kg/m  BP Readings from Last 3 Encounters:   03/07/24 120/70  02/07/24 124/86  09/30/23 126/82   Wt Readings from Last 3 Encounters:  05/02/24 182 lb 1.6 oz (82.6 kg)  03/07/24 179 lb 4.8 oz (81.3 kg)  02/07/24 179 lb 6.4 oz (81.4 kg)      Physical Exam Exam conducted with a chaperone present.  Constitutional:      Appearance: Normal appearance.  HENT:     Head: Normocephalic and atraumatic.  Eyes:     Conjunctiva/sclera: Conjunctivae normal.  Cardiovascular:     Rate and Rhythm: Normal rate and regular rhythm.  Pulmonary:     Effort: Pulmonary effort is normal.     Breath sounds: Normal breath sounds.  Chest:  Breasts:    Right: Tenderness present. No swelling, bleeding, inverted nipple, mass, nipple discharge or skin change.     Left: Normal. No swelling, bleeding, inverted nipple, mass, nipple discharge, skin change or tenderness.     Comments: Fibrocystic changes in right breast with tenderness Musculoskeletal:     Comments: Right superior angle of scapula inflamed with redness and tenderness   Lymphadenopathy:     Upper Body:     Right upper body: No supraclavicular, axillary or pectoral adenopathy.     Left upper body: No supraclavicular, axillary or pectoral adenopathy.  Skin:    General: Skin is warm and dry.  Neurological:     General: No focal deficit present.     Mental Status: She is alert. Mental status  is at baseline.  Psychiatric:        Mood and Affect: Mood normal.        Behavior: Behavior normal.     No results found for any visits on 05/02/24.      Assessment & Plan:   Assessment & Plan  Right upper back and breast pain, under evaluation Intermittent sharp pain with tenderness and redness. Differential includes muscle spasm, nerve compression, and shingles. No rash or bony abnormalities. Possible inflammatory response. - Order ultrasound of breasts to evaluate for fibrous tissue or other abnormalities. - Order x-ray of scapula and shoulder to assess for joint or bone issues. -  Advise monitoring for rash or worsening symptoms indicative of shingles.  Gastroesophageal reflux disease without esophagitis GERD symptoms well-controlled with lifestyle modifications. Discussed dietary triggers and lifestyle changes. Prefers lifestyle management with medication as needed. - Advise continuation of lifestyle modifications including avoiding dietary triggers, eating smaller frequent meals, and not lying down immediately after meals. - Recommend over-the-counter antacids like Tums, Pepto Bismol, or Pepcid  as needed for symptom relief. - Discuss potential use of pantoprazole  as needed, keeping remaining pills for future use if necessary.  - US  LIMITED ULTRASOUND INCLUDING AXILLA LEFT BREAST ; Future - US  LIMITED ULTRASOUND INCLUDING AXILLA RIGHT BREAST; Future - DG Shoulder Right; Future   Return in about 6 months (around 10/30/2024).  Sharyle Fischer, DO

## 2024-05-03 ENCOUNTER — Ambulatory Visit: Payer: Self-pay | Admitting: Internal Medicine

## 2024-06-08 ENCOUNTER — Encounter: Payer: Self-pay | Admitting: Internal Medicine

## 2024-06-12 NOTE — Telephone Encounter (Signed)
 Patient notified

## 2024-06-26 ENCOUNTER — Encounter: Payer: Self-pay | Admitting: Radiology

## 2024-07-18 ENCOUNTER — Other Ambulatory Visit: Payer: Self-pay | Admitting: Internal Medicine

## 2024-07-18 DIAGNOSIS — I10 Essential (primary) hypertension: Secondary | ICD-10-CM

## 2024-07-18 NOTE — Telephone Encounter (Signed)
 Requested Prescriptions  Pending Prescriptions Disp Refills   amLODipine  (NORVASC ) 5 MG tablet [Pharmacy Med Name: amLODIPine  Besylate 5 MG Oral Tablet] 45 tablet 0    Sig: TAKE ONE-HALF TABLET BY MOUTH  DAILY     Cardiovascular: Calcium  Channel Blockers 2 Passed - 07/18/2024  6:00 PM      Passed - Last BP in normal range    BP Readings from Last 1 Encounters:  05/02/24 130/72         Passed - Last Heart Rate in normal range    Pulse Readings from Last 1 Encounters:  05/02/24 81         Passed - Valid encounter within last 6 months    Recent Outpatient Visits           2 months ago Breast pain, right   [facility] Bernardo Fend, DO   4 months ago Annual physical exam   [facility] Bernardo Fend, DO   5 months ago Gastroesophageal reflux disease with esophagitis, unspecified whether hemorrhage   [facility] Bernardo Fend, DO   9 months ago Acute pain of right knee   [facility] Bernardo Fend, DO       Future Appointments             In 3 months Bernardo Fend, DO [facility], [place]

## 2024-07-22 ENCOUNTER — Other Ambulatory Visit: Payer: Self-pay | Admitting: Family Medicine

## 2024-07-22 DIAGNOSIS — I10 Essential (primary) hypertension: Secondary | ICD-10-CM

## 2024-07-22 MED ORDER — HYDROCHLOROTHIAZIDE 25 MG PO TABS: 25.0000 mg | ORAL_TABLET | Freq: Every day | ORAL | 0 refills | Status: AC

## 2024-08-08 ENCOUNTER — Other Ambulatory Visit: Payer: Self-pay | Admitting: Internal Medicine

## 2024-08-08 DIAGNOSIS — I10 Essential (primary) hypertension: Secondary | ICD-10-CM

## 2024-08-09 LAB — HM MAMMOGRAPHY

## 2024-08-10 NOTE — Telephone Encounter (Signed)
 Requested Prescriptions  Pending Prescriptions Disp Refills   lisinopril  (ZESTRIL ) 40 MG tablet [Pharmacy Med Name: Lisinopril  40 MG Oral Tablet] 90 tablet 1    Sig: TAKE 1 TABLET BY MOUTH DAILY     Cardiovascular:  ACE Inhibitors Passed - 08/10/2024  3:09 PM      Passed - Cr in normal range and within 180 days    Creatinine, Ser  Date Value Ref Range Status  03/07/2024 0.97 0.57 - 1.00 mg/dL Final         Passed - K in normal range and within 180 days    Potassium  Date Value Ref Range Status  03/07/2024 4.3 3.5 - 5.2 mmol/L Final         Passed - Patient is not pregnant      Passed - Last BP in normal range    BP Readings from Last 1 Encounters:  05/02/24 130/72         Passed - Valid encounter within last 6 months    Recent Outpatient Visits           3 months ago Breast pain, right   [facility] Bernardo Fend, DO   5 months ago Annual physical exam   [facility] Bernardo Fend, DO   6 months ago Gastroesophageal reflux disease with esophagitis, unspecified whether hemorrhage   [facility] Bernardo Fend, DO   10 months ago Acute pain of right knee   [facility] Bernardo Fend, DO       Future Appointments             In 2 months Bernardo Fend, DO [facility], [place]

## 2024-08-21 ENCOUNTER — Telehealth: Payer: Self-pay

## 2024-08-21 NOTE — Telephone Encounter (Signed)
 Copied from CRM #[id number]. Topic: Clinical - Medication Question >> Aug 21, 2024 10:50 AM Emylou G wrote: Reason for CRM: Patient called.. would rather just a script than be seen.. She is having muscle spasms from back going down to her leg.. Can we call her in a script?  Her pharmacy is the [facility]

## 2024-08-21 NOTE — Telephone Encounter (Signed)
 No rx w/out appt or [facility]

## 2024-08-21 NOTE — Telephone Encounter (Signed)
 Appt sch'd for 08/23/24

## 2024-08-23 ENCOUNTER — Other Ambulatory Visit: Payer: Self-pay

## 2024-08-23 ENCOUNTER — Ambulatory Visit: Admitting: Internal Medicine

## 2024-08-23 VITALS — BP 132/78 | HR 92 | Resp 16 | Ht 64.0 in | Wt 182.0 lb

## 2024-08-23 DIAGNOSIS — M5416 Radiculopathy, lumbar region: Secondary | ICD-10-CM | POA: Diagnosis not present

## 2024-08-23 MED ORDER — KETOROLAC TROMETHAMINE 60 MG/2ML IM SOLN
60.0000 mg | Freq: Once | INTRAMUSCULAR | Status: AC
  Administered 2024-08-23: 60 mg via INTRAMUSCULAR

## 2024-08-23 MED ORDER — PREDNISONE 10 MG PO TABS: ORAL_TABLET | ORAL | 0 refills | Status: AC

## 2024-08-23 MED ORDER — TIZANIDINE HCL 4 MG PO TABS: 4.0000 mg | ORAL_TABLET | Freq: Every evening | ORAL | 0 refills | Status: DC | PRN

## 2024-08-23 NOTE — Progress Notes (Signed)
 "  Acute Office Visit  Subjective:     Patient ID: Rachael Russell, female    DOB: May 02, 1966, 58 y.o.   MRN: [id number]  Chief Complaint  Patient presents with   Back Pain    Lower back for 4 days, pain radiates down right leg    Back Pain Associated symptoms include tingling. Pertinent negatives include no weakness.   Patient is in today for lower back pain.   Discussed the use of AI scribe software for clinical note transcription with the patient, who gave verbal consent to proceed.  History of Present Illness Rachael Russell is a 58 year old female with a history of bulging disc and spinal stenosis who presents with worsening low back pain radiating to the right leg.  She has had low back pain for four days, radiating down the right leg to the toes, with new numbness and tingling in the toes since yesterday. Pain is described as central in the low back and is accompanied by painful muscle spasms.  Her prior MRI in 2020 showed spondylosis at L3-L4 with mild spinal canal stenosis.  She is taking Flexeril  5 mg three times daily, Tylenol  three times daily, and naproxen , but none are providing adequate relief. Spasms cause significant discomfort.  She denies numbness of the inner thighs and denies bowel or bladder incontinence. She has difficulty with personal hygiene due to low back and buttock pain and notes constipation, which she attributes to methotrexate.  She is currently unable to drive because she fears triggering spasms.   Review of Systems  Musculoskeletal:  Positive for back pain.  Neurological:  Positive for tingling. Negative for weakness.        Objective:    BP 132/78 (Cuff Size: Large)   Pulse 92   Resp 16   Ht 5' 4 (1.626 m)   Wt 182 lb (82.6 kg)   LMP 04/25/2019   SpO2 99%   BMI 31.24 kg/m  BP Readings from Last 3 Encounters:  08/23/24 132/78  05/02/24 130/72  03/07/24 120/70   Wt Readings from Last 3 Encounters:  08/23/24 182 lb (82.6 kg)   05/02/24 182 lb 1.6 oz (82.6 kg)  03/07/24 179 lb 4.8 oz (81.3 kg)      Physical Exam Constitutional:      Appearance: Normal appearance.  HENT:     Head: Normocephalic and atraumatic.  Eyes:     Conjunctiva/sclera: Conjunctivae normal.  Cardiovascular:     Rate and Rhythm: Normal rate and regular rhythm.  Pulmonary:     Effort: Pulmonary effort is normal.     Breath sounds: Normal breath sounds.  Musculoskeletal:     Lumbar back: Spasms and tenderness present.  Skin:    General: Skin is warm and dry.  Neurological:     General: No focal deficit present.     Mental Status: She is alert. Mental status is at baseline.  Psychiatric:        Mood and Affect: Mood normal.        Behavior: Behavior normal.     No results found for any visits on 08/23/24.      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 Previous MRI showed spondylosis and mild stenosis, likely worsening. Current medications insufficient. Differential includes bulging or herniated disc. Discussed potential neurosurgical evaluation and temporary relief from steroids. Explained  red flag symptoms requiring emergency evaluation. - Administered Toradol  injection for immediate anti-inflammatory effect. - Prescribed Zanaflex  as a new muscle relaxant. - Prescribed a steroid taper. - Referred to neurosurgeon Dr. Katrina for further evaluation and potential MRI. Discussed ordering MRI today however patient concerned about insurance coverage, prefers to wait and discuss with Neurosurgery.  - Advised to seek emergency care if saddle anesthesia or incontinence develops.   - Ambulatory referral to Neurosurgery - tiZANidine  (ZANAFLEX ) 4 MG tablet; Take 1 tablet (4 mg total) by mouth at bedtime as needed.  Dispense: 30 tablet; Refill: 0 -  predniSONE  (DELTASONE ) 10 MG tablet; Take 2 tablets (20 mg total) by mouth daily with breakfast for 3 days, THEN 1 tablet (10 mg total) daily with breakfast for 3 days, THEN 0.5 tablets (5 mg total) daily with breakfast for 3 days.  Dispense: 10.5 tablet; Refill: 0 - ketorolac  (TORADOL ) injection 60 mg   Return for already scheduled.  Sharyle Fischer, DO   "

## 2024-08-25 ENCOUNTER — Encounter: Payer: Self-pay | Admitting: Internal Medicine

## 2024-08-25 ENCOUNTER — Ambulatory Visit: Payer: Self-pay

## 2024-08-25 ENCOUNTER — Other Ambulatory Visit: Payer: Self-pay | Admitting: Internal Medicine

## 2024-08-25 DIAGNOSIS — M5416 Radiculopathy, lumbar region: Secondary | ICD-10-CM

## 2024-08-25 DIAGNOSIS — M48061 Spinal stenosis, lumbar region without neurogenic claudication: Secondary | ICD-10-CM

## 2024-08-25 MED ORDER — TRAMADOL HCL 50 MG PO TABS: 50.0000 mg | ORAL_TABLET | Freq: Three times a day (TID) | ORAL | 0 refills | Status: DC | PRN

## 2024-08-25 NOTE — Telephone Encounter (Signed)
" °  FYI Only or Action Required?: FYI only for provider: Gave MD instructions to start Tramadol  that is being sent in and an MRI will be ordered.  Patient was last seen in primary care on 08/23/2024 by Bernardo Fend, DO.  Called Nurse Triage reporting Back Pain.  Symptoms began several days ago.  Interventions attempted: Prescription medications: steroids.  Symptoms are: unchanged.  Triage Disposition: No disposition on file.  Patient/caregiver understands and will follow disposition?: Yes  Copied from CRM [id number]. Topic: Clinical - Red Word Triage >> Aug 25, 2024  1:03 PM Shanda MATSU wrote: Red Word that prompted transfer to Nurse Triage: Patient is reporting still having back pain at a pain level of 10. Answer Assessment - Initial Assessment Questions Calling to states no improvement on condition. Sent message to provider this am. Reviewed MR. Provider responded, I think we should order an MRI. I am ordering it now to be scheduled. I can send in Tramadol  to try for the pain to take every 8 hours as needed along with the steroids. They are still working on the referral. Info given to pt. Instructed to call back for loss of control of bowel, bladder.    1. MAIN CONCERN OR SYMPTOM:  What is your main concern right now? What question do you have? What's the main symptom you're worried about? (e.g., breathing difficulty, cough, fever, pain)     Back pain  3. BETTER-SAME-WORSE: Are you getting better, staying the same, or getting worse compared to how you felt at your last visit to the doctor (most recent medical visit)?     same 4. VISIT DATE: When were you seen? (e.g., date)     12/31  6. VISIT DIAGNOSIS:  What was the main symptom or problem that you were seen for? Were you given a diagnosis?      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 VISIT MEDICINES: Did the doctor order any new medicines for you to use? If Yes, ask: Have you filled the  prescription and started taking the medicine?      steroids  Protocols used: Recent Medical Visit for Illness Follow-up Call-A-AH  "

## 2024-08-25 NOTE — Telephone Encounter (Signed)
"  Patient notified   "

## 2024-08-26 ENCOUNTER — Ambulatory Visit
Admission: RE | Admit: 2024-08-26 | Discharge: 2024-08-26 | Disposition: A | Source: Ambulatory Visit | Attending: Internal Medicine

## 2024-08-26 DIAGNOSIS — M5416 Radiculopathy, lumbar region: Secondary | ICD-10-CM

## 2024-08-26 DIAGNOSIS — M48061 Spinal stenosis, lumbar region without neurogenic claudication: Secondary | ICD-10-CM

## 2024-08-29 ENCOUNTER — Telehealth: Payer: Self-pay

## 2024-08-29 ENCOUNTER — Telehealth: Payer: Self-pay | Admitting: Internal Medicine

## 2024-08-29 DIAGNOSIS — M5416 Radiculopathy, lumbar region: Secondary | ICD-10-CM

## 2024-08-29 DIAGNOSIS — M48061 Spinal stenosis, lumbar region without neurogenic claudication: Secondary | ICD-10-CM

## 2024-08-29 NOTE — Telephone Encounter (Signed)
 Copied from CRM [id number]. Topic: Clinical - Refused Triage >> Aug 29, 2024  4:13 PM Antwanette L wrote:  Patient called regarding the status of the neurology referral for back pain. She was informed that the referral is currently pending. The patient reported ongoing back pain and was offered the option to speak with a nurse, but she declined.

## 2024-08-29 NOTE — Telephone Encounter (Signed)
 Pt called new referral placed

## 2024-08-29 NOTE — Telephone Encounter (Signed)
 Copied from CRM [id number]. Topic: Referral - Status >> Aug 29, 2024 10:52 AM Rachael Russell wrote: Reason for CRM: Neurology referral was sent to [facility]. Patient does not want to go to [facility] for the neurology referral due to distance. Patients want to see Rachael Russell, [phone].  Patient CB# [phone]

## 2024-08-30 ENCOUNTER — Telehealth: Payer: Self-pay | Admitting: Internal Medicine

## 2024-08-30 ENCOUNTER — Other Ambulatory Visit: Payer: Self-pay

## 2024-08-30 ENCOUNTER — Ambulatory Visit: Payer: Self-pay | Admitting: Internal Medicine

## 2024-08-30 DIAGNOSIS — M48061 Spinal stenosis, lumbar region without neurogenic claudication: Secondary | ICD-10-CM

## 2024-08-30 DIAGNOSIS — M5416 Radiculopathy, lumbar region: Secondary | ICD-10-CM

## 2024-08-30 MED ORDER — TRAMADOL HCL 50 MG PO TABS: 50.0000 mg | ORAL_TABLET | Freq: Three times a day (TID) | ORAL | 0 refills | Status: AC | PRN

## 2024-08-30 NOTE — Telephone Encounter (Signed)
 Copied from CRM [id number]. Topic: Referral - Question >> Aug 30, 2024 11:06 AM Pinkey ORN wrote: Reason for CRM: Referral >> Aug 30, 2024 11:07 AM Pinkey ORN wrote: Patient is requesting that either Bernardo Norris, DO or her nurse give patient a call back. Patient states that it's in regards to her back.

## 2024-08-30 NOTE — Telephone Encounter (Signed)
 Called patient states she is in severe pain, wants to see if you will refill tramadol  only has 2 pills left.  She states has an appointment next tues. w/ the neurosurgeon, wanted to know if anyone could get her in sooner and I told her Tues. Is soon compared to other places.  Also looks like you ordered MRI and it has been done but not read, so will call to push that through, but will still probably not get to us  until tomorrow.  I told pt if pain to much she may need to go to [facility] or [facility].

## 2024-08-30 NOTE — Telephone Encounter (Signed)
 Copied from CRM [id number]. Topic: Referral - Status >> Aug 29, 2024 10:52 AM Darshell M wrote: Reason for CRM: Neurology referral was sent to [facility]. Patient does not want to go to [facility] for the neurology referral due to distance. Patients want to see Dr. Alm Molt, [phone].  Patient CB# [phone] >> Aug 29, 2024  4:12 PM Antwanette L wrote: Patient called regarding the status of the neurology referral for back pain. The patient was informed that the referral is currently pending

## 2024-09-01 NOTE — Telephone Encounter (Signed)
 Reason for Disposition  [1] Recent medical visit within 24 hours AND [2] condition / symptoms SAME (unchanged) AND [3] caller has additional questions triager can answer  Protocols used: Recent Medical Visit for Illness Follow-up Call-A-AH

## 2024-09-08 ENCOUNTER — Encounter: Payer: Self-pay | Admitting: Internal Medicine

## 2024-09-08 ENCOUNTER — Ambulatory Visit: Payer: Self-pay | Admitting: Internal Medicine

## 2024-09-08 ENCOUNTER — Other Ambulatory Visit: Payer: Self-pay | Admitting: Internal Medicine

## 2024-09-08 DIAGNOSIS — M5416 Radiculopathy, lumbar region: Secondary | ICD-10-CM

## 2024-09-08 NOTE — Telephone Encounter (Addendum)
 FYI Only or Action Required?: Action required by provider: medication refill request.  Patient was last seen in primary care on 08/23/2024 by Bernardo Fend, DO.  Called Nurse Triage reporting Medication Refill.  Symptoms began today.  Interventions attempted: Prescription medications: tiZANidine  (ZANAFLEX ) 4 MG tablet.  Symptoms are: unchanged.  Triage Disposition: Call PCP When Office is Open  Patient/caregiver understands and will follow disposition?: Yes  Pt has been taking medication TID rather than every day d/t she thought it was same instructions as Tramadol . Advised pt I would send message to provider but can't guarantee refill today. Pt requested to speak with Sherrilyn, CMA. CAL attempted but no answer.   Message from [place] P sent at 09/08/2024  3:54 PM EST  Reason for Triage: nuerosurgeon - cant take injection until monday.   Reason for Disposition  [1] Prescription refill request for NON-ESSENTIAL medicine (i.e., no harm to patient if med not taken) AND [2] triager unable to refill per department policy  Answer Assessment - Initial Assessment Questions 1. DRUG NAME: What medicine do you need to have refilled?     tiZANidine  (ZANAFLEX ) 4 MG tablet 2. REFILLS REMAINING: How many refills are remaining? Notes: The label on the medicine or pill bottle will show how many refills are remaining. If there are no refills remaining, then a renewal may be needed.     0 was new prescription  4. PRESCRIBER: Who prescribed it? Note: The prescribing doctor or group is responsible for refill approvals.SABRA Bernardo 5. PHARMACY: Have you contacted your pharmacy (drugstore)? Note: Some pharmacies will contact the doctor (or NP/PA).      Pt contacted pharmacy but too soon 6. SYMPTOMS: Do you have any symptoms?     Back pain  Protocols used: Medication Refill and Renewal Call-A-AH

## 2024-09-08 NOTE — Telephone Encounter (Signed)
 Summary  refill request  Communication  Medication: tiZANidine  (ZANAFLEX ) 4 MG tablet        Has the patient contacted their pharmacy? No            This is the patient's preferred pharmacy:        [facility] [address] [place] - [address]    [address] [place]    [place] [address]    Phone: [phone] Fax: [phone]        Is this the correct pharmacy for this prescription? Yes    If no, delete pharmacy and type the correct one.        Has the prescription been filled recently? Yes        Is the patient out of the medication? Yes        Has the patient been seen for an appointment in the last year OR does the patient have an upcoming appointment? Yes        Can we respond through MyChart? Yes      Agent: Please be advised that Rx refills may take up to 3 business days. We ask that you follow-up with your pharmacy.

## 2024-09-08 NOTE — Telephone Encounter (Addendum)
 Requested medication (s) are due for refill today: pt has 1 tablet left   Requested medication (s) are on the active medication list: yes  Last refill:  08/23/24 #30/0  Future visit scheduled: yes  Notes to clinic:  Unable to refill per protocol, cannot delegate. Pt has been taking rx TID rather than as prescribed 1 tablet daily prn.      Requested Prescriptions  Pending Prescriptions Disp Refills   tiZANidine  (ZANAFLEX ) 4 MG tablet 30 tablet 0    Sig: Take 1 tablet (4 mg total) by mouth at bedtime as needed.     Not Delegated - Cardiovascular:  Alpha-2 Agonists - tizanidine  Failed - 09/08/2024  4:10 PM      Failed - This refill cannot be delegated      Passed - Valid encounter within last 6 months    Recent Outpatient Visits           2 weeks ago Lumbar back pain with radiculopathy affecting right lower extremity   [facility] Bernardo Fend, DO   4 months ago Breast pain, right   [facility] Bernardo Fend, DO   6 months ago Annual physical exam   [facility] Bernardo Fend, DO   7 months ago Gastroesophageal reflux disease with esophagitis, unspecified whether hemorrhage   [facility] Bernardo Fend, DO   11 months ago Acute pain of right knee   [facility] Bernardo Fend, DO       Future Appointments             In 1 month Bernardo Fend, DO [facility] [place]

## 2024-09-11 MED ORDER — TIZANIDINE HCL 4 MG PO TABS: 4.0000 mg | ORAL_TABLET | Freq: Every evening | ORAL | 0 refills | Status: AC | PRN

## 2024-09-11 NOTE — Telephone Encounter (Signed)
 Requested medication (s) are due for refill today: no  Requested medication (s) are on the active medication list: yes  Last refill:  09/11/24  Future visit scheduled: yes  Notes to clinic:  Unable to refill per protocol, cannot delegate. Duplicate request      Requested Prescriptions  Pending Prescriptions Disp Refills   tiZANidine  (ZANAFLEX ) 4 MG tablet [Pharmacy Med Name: tiZANidine  HCl 4 MG Oral Tablet] 30 tablet 0    Sig: TAKE 1 TABLET BY MOUTH AT BEDTIME AS NEEDED     Not Delegated - Cardiovascular:  Alpha-2 Agonists - tizanidine  Failed - 09/11/2024  8:44 AM      Failed - This refill cannot be delegated      Passed - Valid encounter within last 6 months    Recent Outpatient Visits           2 weeks ago Lumbar back pain with radiculopathy affecting right lower extremity   [facility] Bernardo Fend, DO   4 months ago Breast pain, right   [facility] Bernardo Fend, DO   6 months ago Annual physical exam   [facility] Bernardo Fend, DO   7 months ago Gastroesophageal reflux disease with esophagitis, unspecified whether hemorrhage   [facility] Bernardo Fend, DO   11 months ago Acute pain of right knee   [facility] Bernardo Fend, DO       Future Appointments             In 1 month Bernardo Fend, DO [facility], [place]

## 2024-10-30 ENCOUNTER — Ambulatory Visit: Admitting: Internal Medicine
